# Patient Record
Sex: Male | Born: 1935 | Race: White | Hispanic: No | Marital: Married | Smoking: Former smoker
Health system: Southern US, Community
[De-identification: ages and names within clinical notes are randomized; demographics above are authoritative.]

## PROBLEM LIST (undated history)

## (undated) DIAGNOSIS — I639 Cerebral infarction, unspecified: Secondary | ICD-10-CM

## (undated) DIAGNOSIS — M1990 Unspecified osteoarthritis, unspecified site: Secondary | ICD-10-CM

## (undated) DIAGNOSIS — Z9289 Personal history of other medical treatment: Secondary | ICD-10-CM

## (undated) DIAGNOSIS — K861 Other chronic pancreatitis: Secondary | ICD-10-CM

## (undated) DIAGNOSIS — F32A Depression, unspecified: Secondary | ICD-10-CM

## (undated) DIAGNOSIS — I4891 Unspecified atrial fibrillation: Secondary | ICD-10-CM

## (undated) DIAGNOSIS — E039 Hypothyroidism, unspecified: Secondary | ICD-10-CM

## (undated) DIAGNOSIS — E1165 Type 2 diabetes mellitus with hyperglycemia: Secondary | ICD-10-CM

## (undated) DIAGNOSIS — E7800 Pure hypercholesterolemia, unspecified: Secondary | ICD-10-CM

## (undated) DIAGNOSIS — Z9981 Dependence on supplemental oxygen: Secondary | ICD-10-CM

## (undated) DIAGNOSIS — F329 Major depressive disorder, single episode, unspecified: Secondary | ICD-10-CM

## (undated) DIAGNOSIS — I10 Essential (primary) hypertension: Secondary | ICD-10-CM

## (undated) DIAGNOSIS — R51 Headache: Secondary | ICD-10-CM

## (undated) DIAGNOSIS — IMO0002 Reserved for concepts with insufficient information to code with codable children: Secondary | ICD-10-CM

## (undated) DIAGNOSIS — H04123 Dry eye syndrome of bilateral lacrimal glands: Secondary | ICD-10-CM

## (undated) DIAGNOSIS — E1151 Type 2 diabetes mellitus with diabetic peripheral angiopathy without gangrene: Secondary | ICD-10-CM

## (undated) DIAGNOSIS — I739 Peripheral vascular disease, unspecified: Secondary | ICD-10-CM

## (undated) DIAGNOSIS — I499 Cardiac arrhythmia, unspecified: Secondary | ICD-10-CM

## (undated) DIAGNOSIS — H5440 Blindness, one eye, unspecified eye: Secondary | ICD-10-CM

## (undated) HISTORY — PX: TOE AMPUTATION: SHX809

## (undated) HISTORY — DX: Personal history of other medical treatment: Z92.89

## (undated) HISTORY — PX: JOINT REPLACEMENT: SHX530

## (undated) HISTORY — PX: HEMORRHOID SURGERY: SHX153

## (undated) HISTORY — PX: OTHER SURGICAL HISTORY: SHX169

## (undated) HISTORY — PX: BACK SURGERY: SHX140

## (undated) HISTORY — DX: Essential (primary) hypertension: I10

## (undated) HISTORY — PX: REPLACEMENT TOTAL KNEE: SUR1224

## (undated) HISTORY — PX: CATARACT EXTRACTION: SUR2

---

## 1962-04-23 HISTORY — PX: ENUCLEATION: SHX628

## 1998-04-23 HISTORY — PX: ILIAC ARTERY STENT: SHX1786

## 1998-05-10 ENCOUNTER — Inpatient Hospital Stay (HOSPITAL_COMMUNITY): Admission: RE | Admit: 1998-05-10 | Discharge: 1998-05-13 | Payer: Self-pay | Admitting: Cardiovascular Disease

## 1998-05-10 ENCOUNTER — Encounter: Payer: Self-pay | Admitting: Cardiovascular Disease

## 1998-06-07 ENCOUNTER — Inpatient Hospital Stay (HOSPITAL_COMMUNITY): Admission: EM | Admit: 1998-06-07 | Discharge: 1998-06-10 | Payer: Self-pay | Admitting: Emergency Medicine

## 1998-06-07 ENCOUNTER — Encounter: Payer: Self-pay | Admitting: Emergency Medicine

## 1998-06-11 ENCOUNTER — Encounter: Payer: Self-pay | Admitting: Emergency Medicine

## 1998-06-12 ENCOUNTER — Encounter: Payer: Self-pay | Admitting: Emergency Medicine

## 1998-06-12 ENCOUNTER — Inpatient Hospital Stay (HOSPITAL_COMMUNITY): Admission: EM | Admit: 1998-06-12 | Discharge: 1998-06-16 | Payer: Self-pay | Admitting: Emergency Medicine

## 1998-06-13 ENCOUNTER — Encounter: Payer: Self-pay | Admitting: Internal Medicine

## 1998-06-14 ENCOUNTER — Encounter: Payer: Self-pay | Admitting: Internal Medicine

## 1998-08-09 ENCOUNTER — Inpatient Hospital Stay (HOSPITAL_COMMUNITY): Admission: EM | Admit: 1998-08-09 | Discharge: 1998-08-10 | Payer: Self-pay | Admitting: Emergency Medicine

## 1998-08-09 ENCOUNTER — Encounter: Payer: Self-pay | Admitting: Internal Medicine

## 1998-08-30 ENCOUNTER — Encounter: Payer: Self-pay | Admitting: Orthopedic Surgery

## 1998-08-30 ENCOUNTER — Ambulatory Visit (HOSPITAL_COMMUNITY): Admission: RE | Admit: 1998-08-30 | Discharge: 1998-08-30 | Payer: Self-pay | Admitting: Orthopedic Surgery

## 1998-09-08 ENCOUNTER — Ambulatory Visit (HOSPITAL_COMMUNITY): Admission: RE | Admit: 1998-09-08 | Discharge: 1998-09-08 | Payer: Self-pay | Admitting: Orthopedic Surgery

## 1999-04-08 ENCOUNTER — Inpatient Hospital Stay (HOSPITAL_COMMUNITY): Admission: EM | Admit: 1999-04-08 | Discharge: 1999-04-12 | Payer: Self-pay

## 1999-04-10 ENCOUNTER — Encounter: Payer: Self-pay | Admitting: Internal Medicine

## 1999-04-15 ENCOUNTER — Inpatient Hospital Stay: Admission: EM | Admit: 1999-04-15 | Discharge: 1999-04-20 | Payer: Self-pay | Admitting: *Deleted

## 1999-04-15 ENCOUNTER — Encounter: Payer: Self-pay | Admitting: Vascular Surgery

## 1999-04-16 ENCOUNTER — Encounter: Payer: Self-pay | Admitting: Vascular Surgery

## 1999-04-17 ENCOUNTER — Encounter: Payer: Self-pay | Admitting: Vascular Surgery

## 2000-10-29 ENCOUNTER — Encounter: Admission: RE | Admit: 2000-10-29 | Discharge: 2000-10-29 | Payer: Self-pay | Admitting: Internal Medicine

## 2000-10-29 ENCOUNTER — Encounter: Payer: Self-pay | Admitting: Internal Medicine

## 2002-12-11 ENCOUNTER — Inpatient Hospital Stay (HOSPITAL_COMMUNITY): Admission: AD | Admit: 2002-12-11 | Discharge: 2002-12-12 | Payer: Self-pay | Admitting: Emergency Medicine

## 2002-12-11 ENCOUNTER — Encounter: Payer: Self-pay | Admitting: Emergency Medicine

## 2002-12-12 ENCOUNTER — Encounter: Payer: Self-pay | Admitting: Surgery

## 2002-12-13 ENCOUNTER — Inpatient Hospital Stay (HOSPITAL_COMMUNITY): Admission: EM | Admit: 2002-12-13 | Discharge: 2002-12-15 | Payer: Self-pay | Admitting: Emergency Medicine

## 2002-12-13 ENCOUNTER — Encounter: Payer: Self-pay | Admitting: General Surgery

## 2002-12-13 ENCOUNTER — Encounter: Payer: Self-pay | Admitting: Emergency Medicine

## 2002-12-14 ENCOUNTER — Encounter: Payer: Self-pay | Admitting: General Surgery

## 2003-02-16 ENCOUNTER — Encounter: Admission: RE | Admit: 2003-02-16 | Discharge: 2003-02-16 | Payer: Self-pay | Admitting: Internal Medicine

## 2003-03-09 ENCOUNTER — Inpatient Hospital Stay (HOSPITAL_COMMUNITY): Admission: EM | Admit: 2003-03-09 | Discharge: 2003-03-11 | Payer: Self-pay | Admitting: Emergency Medicine

## 2003-04-24 HISTORY — PX: KYPHOPLASTY: SHX5884

## 2003-05-14 ENCOUNTER — Encounter (INDEPENDENT_AMBULATORY_CARE_PROVIDER_SITE_OTHER): Payer: Self-pay | Admitting: *Deleted

## 2003-05-14 ENCOUNTER — Ambulatory Visit (HOSPITAL_COMMUNITY): Admission: RE | Admit: 2003-05-14 | Discharge: 2003-05-14 | Payer: Self-pay

## 2003-05-25 ENCOUNTER — Encounter: Admission: RE | Admit: 2003-05-25 | Discharge: 2003-05-25 | Payer: Self-pay

## 2003-07-07 ENCOUNTER — Ambulatory Visit (HOSPITAL_COMMUNITY): Admission: RE | Admit: 2003-07-07 | Discharge: 2003-07-07 | Payer: Self-pay

## 2003-07-07 ENCOUNTER — Encounter (INDEPENDENT_AMBULATORY_CARE_PROVIDER_SITE_OTHER): Payer: Self-pay | Admitting: Specialist

## 2003-08-03 ENCOUNTER — Encounter: Admission: RE | Admit: 2003-08-03 | Discharge: 2003-08-03 | Payer: Self-pay

## 2004-03-23 ENCOUNTER — Inpatient Hospital Stay (HOSPITAL_COMMUNITY): Admission: EM | Admit: 2004-03-23 | Discharge: 2004-03-25 | Payer: Self-pay | Admitting: Emergency Medicine

## 2005-01-01 ENCOUNTER — Encounter: Admission: RE | Admit: 2005-01-01 | Discharge: 2005-01-01 | Payer: Self-pay | Admitting: Internal Medicine

## 2005-01-10 ENCOUNTER — Encounter: Payer: Self-pay | Admitting: Internal Medicine

## 2005-01-17 ENCOUNTER — Encounter (INDEPENDENT_AMBULATORY_CARE_PROVIDER_SITE_OTHER): Payer: Self-pay | Admitting: *Deleted

## 2005-01-17 ENCOUNTER — Ambulatory Visit (HOSPITAL_COMMUNITY): Admission: RE | Admit: 2005-01-17 | Discharge: 2005-01-17 | Payer: Self-pay | Admitting: Internal Medicine

## 2005-04-07 IMAGING — CR DG HAND COMPLETE 3+V*L*
3 series · 3 of 3 positions shown · non-contrast
Comparison: none

CLINICAL DATA: Partial amputation of the left fourth finger.
 LEFT HAND
 Three views of the left hand were obtained.  Partial amputation of the left fourth finger is noted, being amputated to the midportion of the middle phalanx of the left fourth digit.  There is soft tissue swelling of the remaining fourth digit, but no present radiographic evidence of osteomyelitis is seen.  Carpal bones are in normal position and the ulnar styloid is intact.
 IMPRESSION
 Partial amputation of the left fourth digit at the midportion of the middle phalanx with soft tissue swelling.  No definite osteomyelitis.

[view not recorded (1 of 3)]
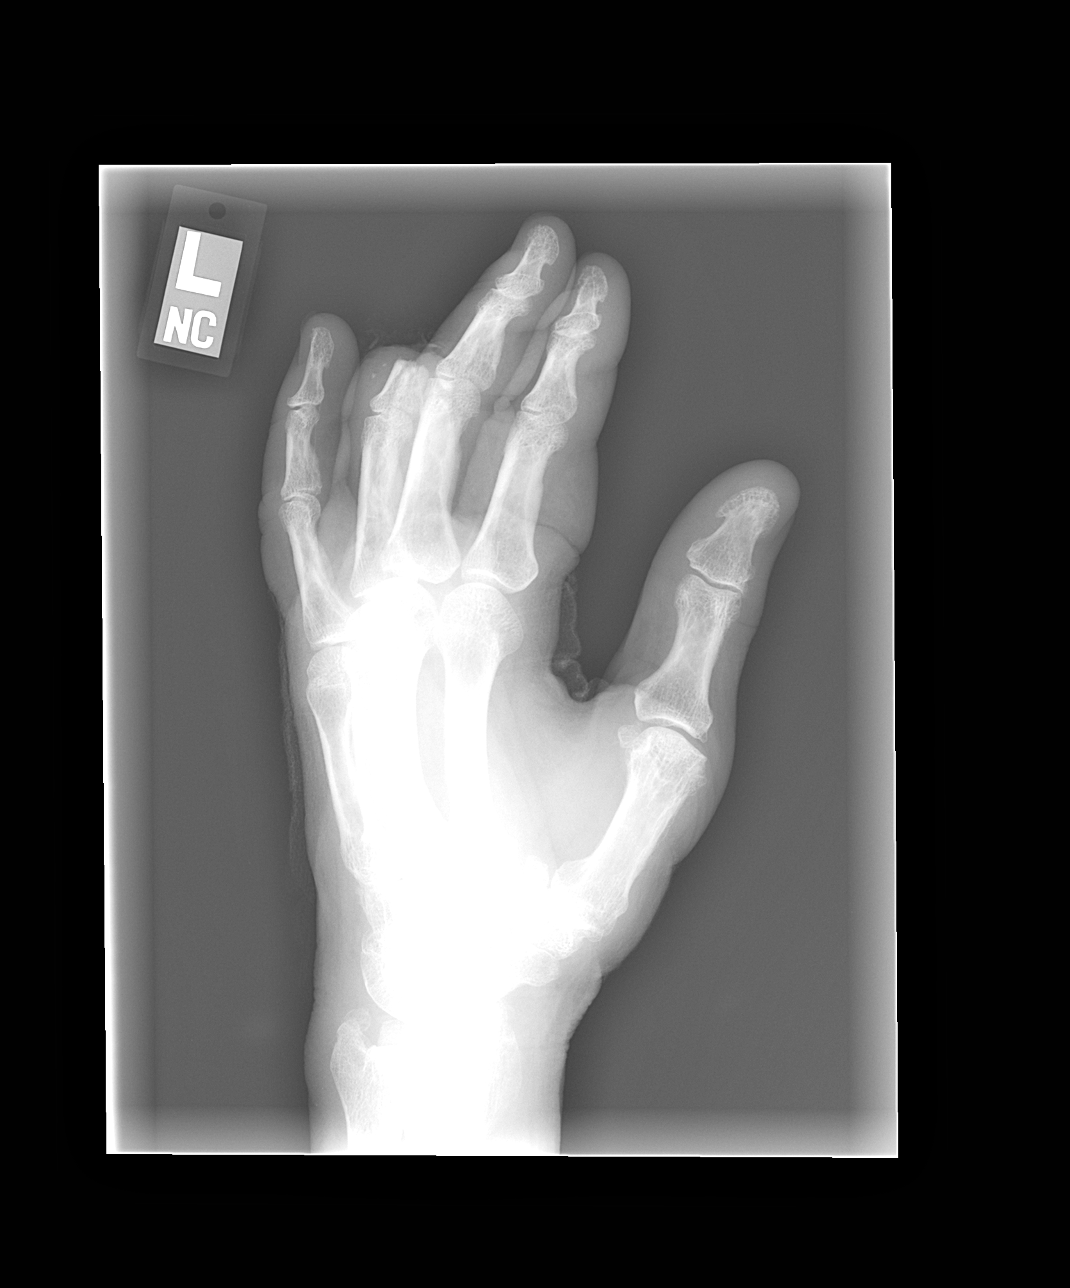

[view not recorded (2 of 3)]
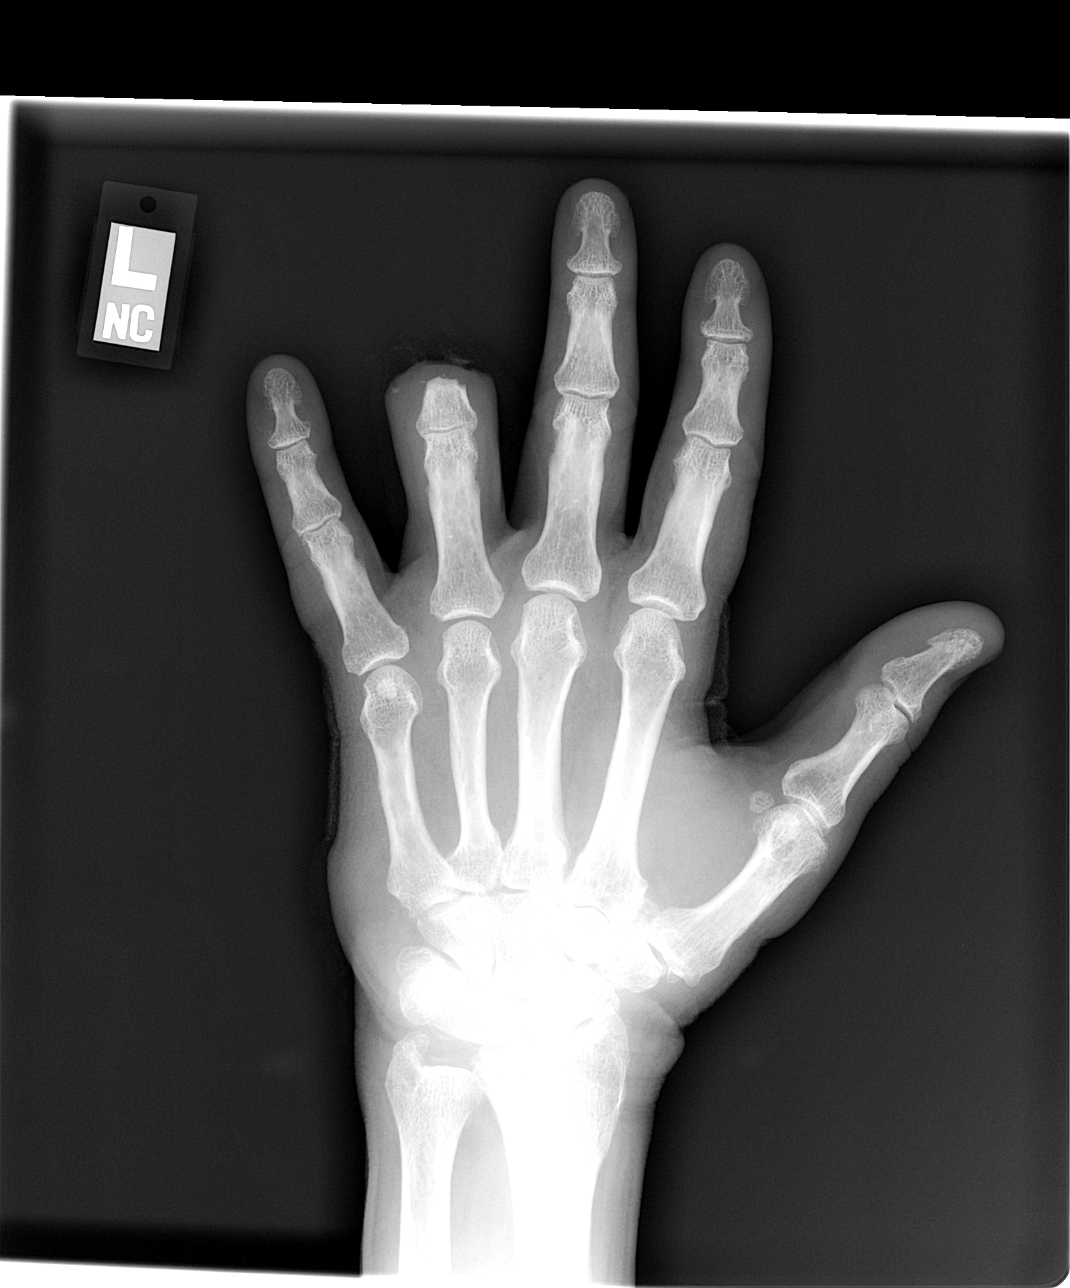

[view not recorded (3 of 3)]
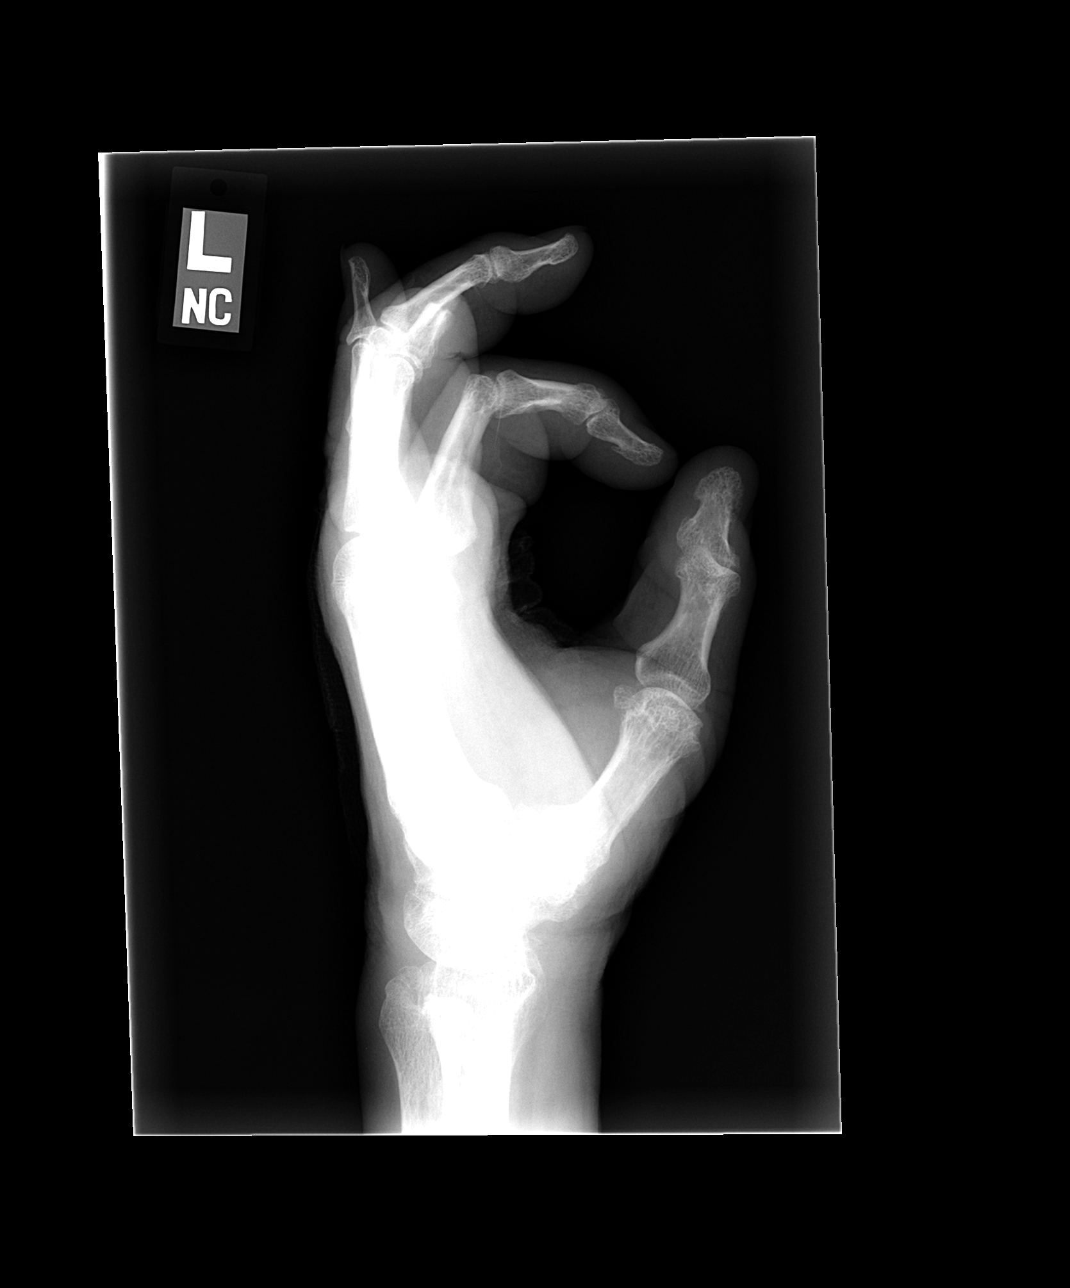

[3 of 3 positions shown; findings below may reference images not displayed]

## 2005-05-02 ENCOUNTER — Ambulatory Visit: Payer: Self-pay | Admitting: Internal Medicine

## 2005-05-17 ENCOUNTER — Encounter (INDEPENDENT_AMBULATORY_CARE_PROVIDER_SITE_OTHER): Payer: Self-pay | Admitting: *Deleted

## 2005-05-17 ENCOUNTER — Ambulatory Visit: Payer: Self-pay | Admitting: Internal Medicine

## 2005-06-16 IMAGING — CR DG HAND COMPLETE 3+V*L*
3 series · 3 of 3 positions shown · non-contrast
Comparison: none

CLINICAL DATA: Partial amputation of the left 12th digit.  Poor healing.  Question osteomyelitis. 
 LEFT HAND COMPLETE 
 There has been amputation of the left fourth finger at the mid proximal phalangeal level.  There is no plain film evidence for osteomyelitis at this time.  Minor degenerative changes are seen associated with the distal interphalangeal joints. 
 IMPRESSION
 Amputation of the left fourth finger at the mid proximal phalangeal level.  No plain film evidence for osteomyelitis.

[view not recorded (1 of 3)]
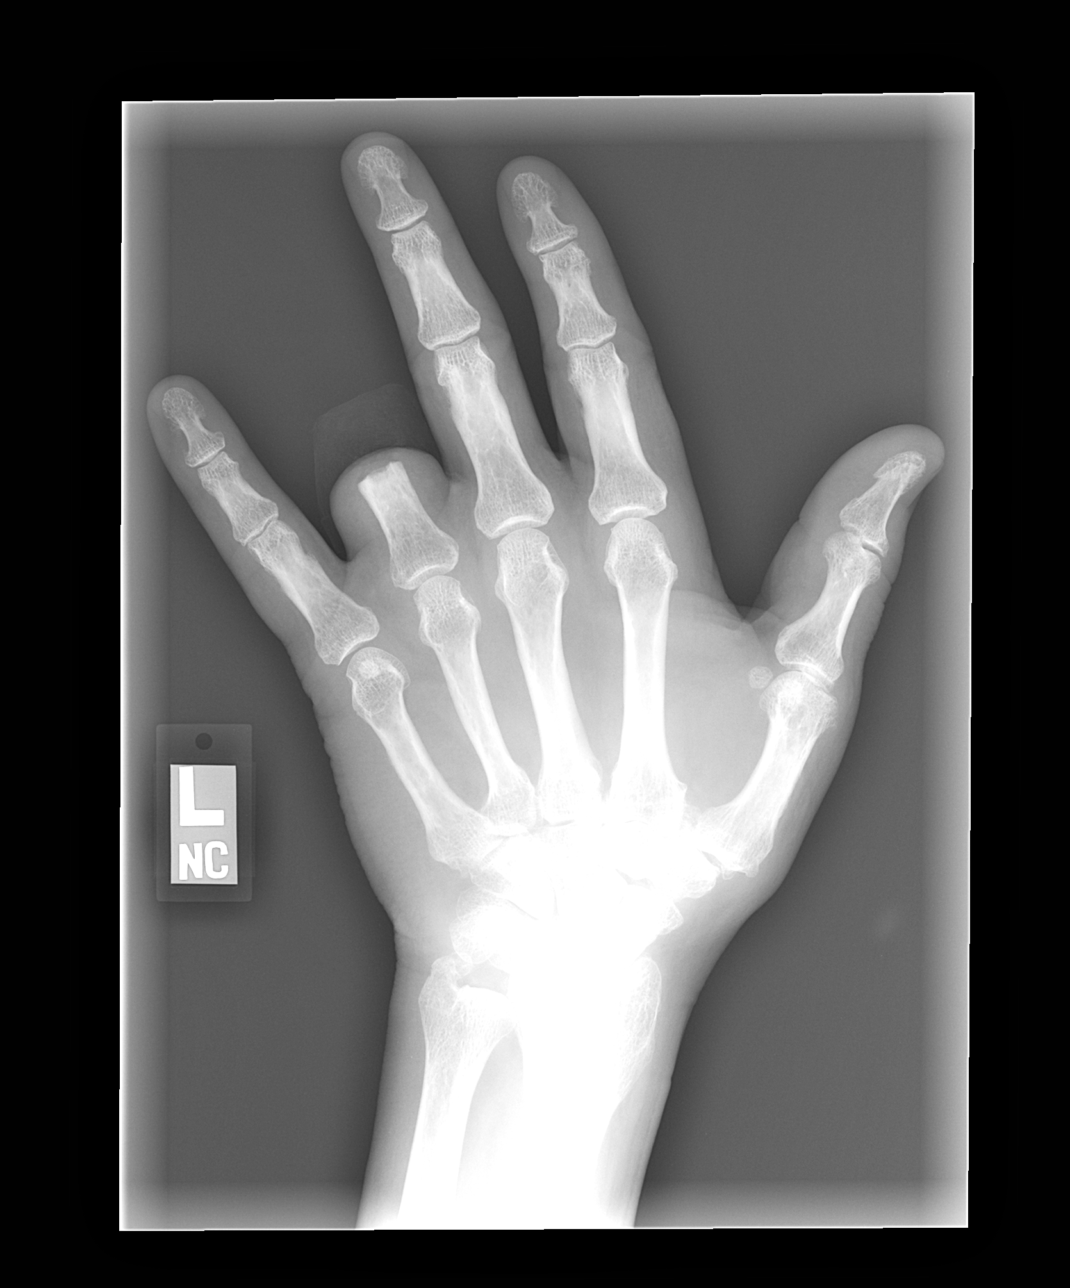

[view not recorded (2 of 3)]
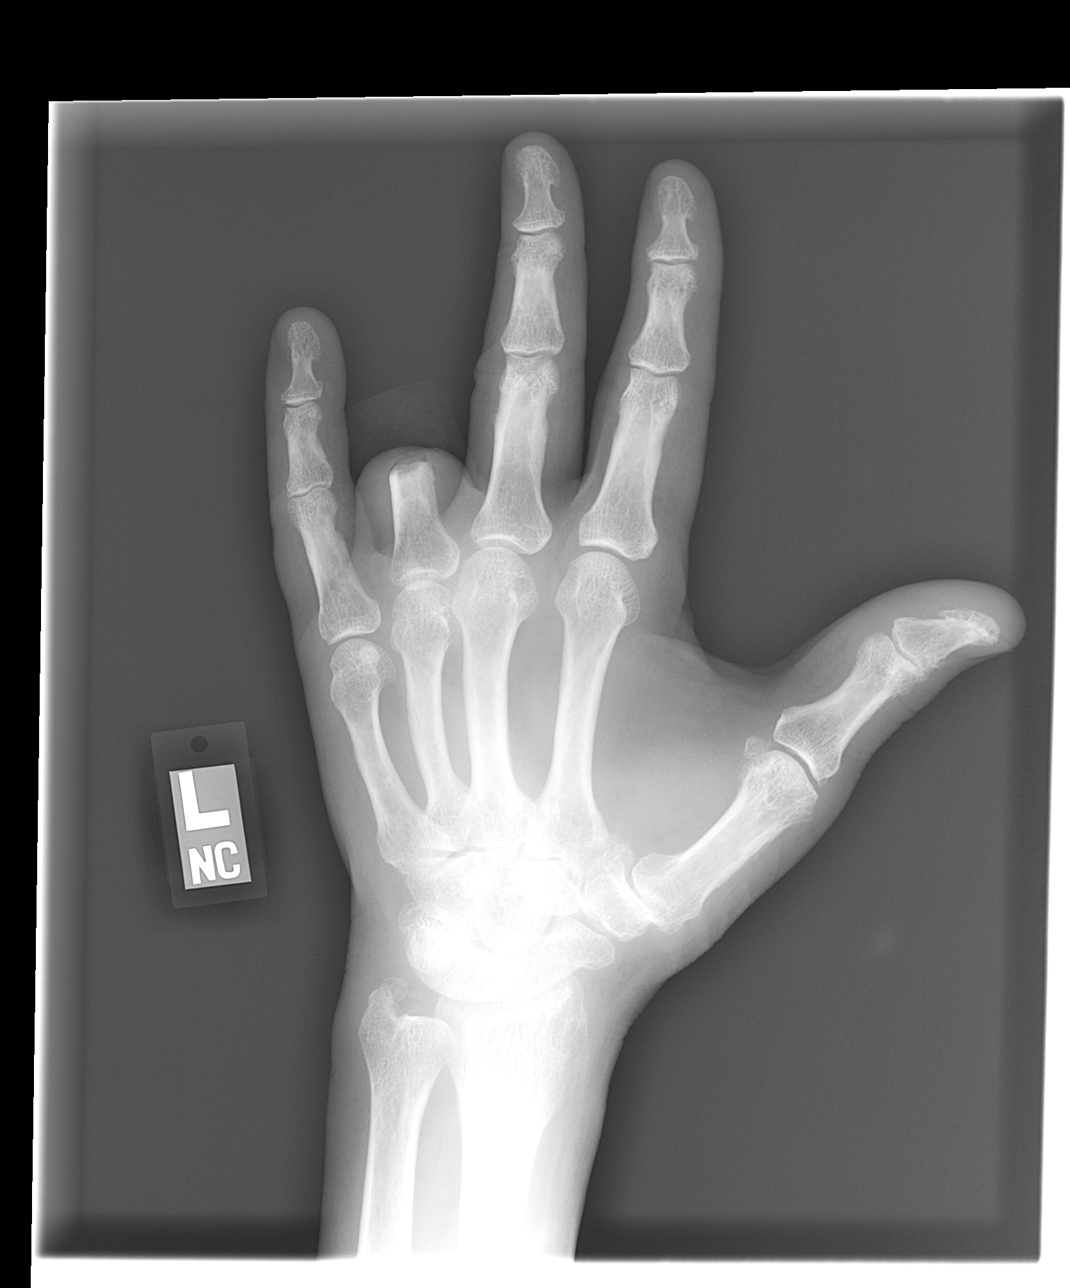

[view not recorded (3 of 3)]
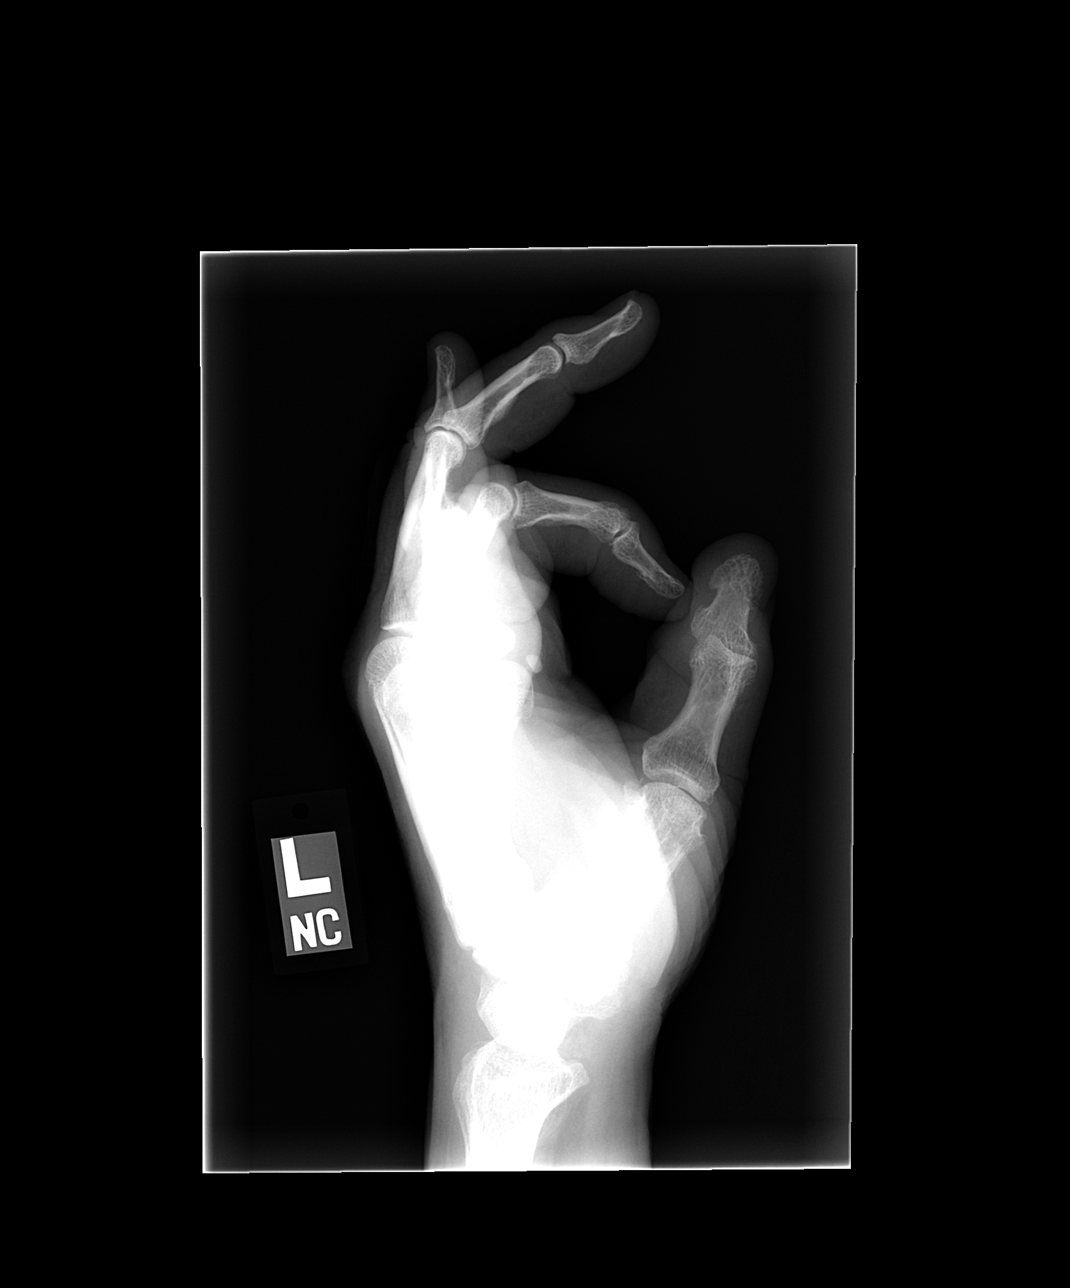

[3 of 3 positions shown; findings below may reference images not displayed]

## 2006-02-04 IMAGING — CR DG CHEST 2V
2 series · 2 of 2 positions shown · non-contrast
Comparison: 2 view chest x-ray [HOSPITAL] 02/16/2003.

CLINICAL DATA: Atrial fibrillation. Shortness of breath.

CHEST - 2 VIEW  03/23/2004:

[view not recorded (1 of 2)]
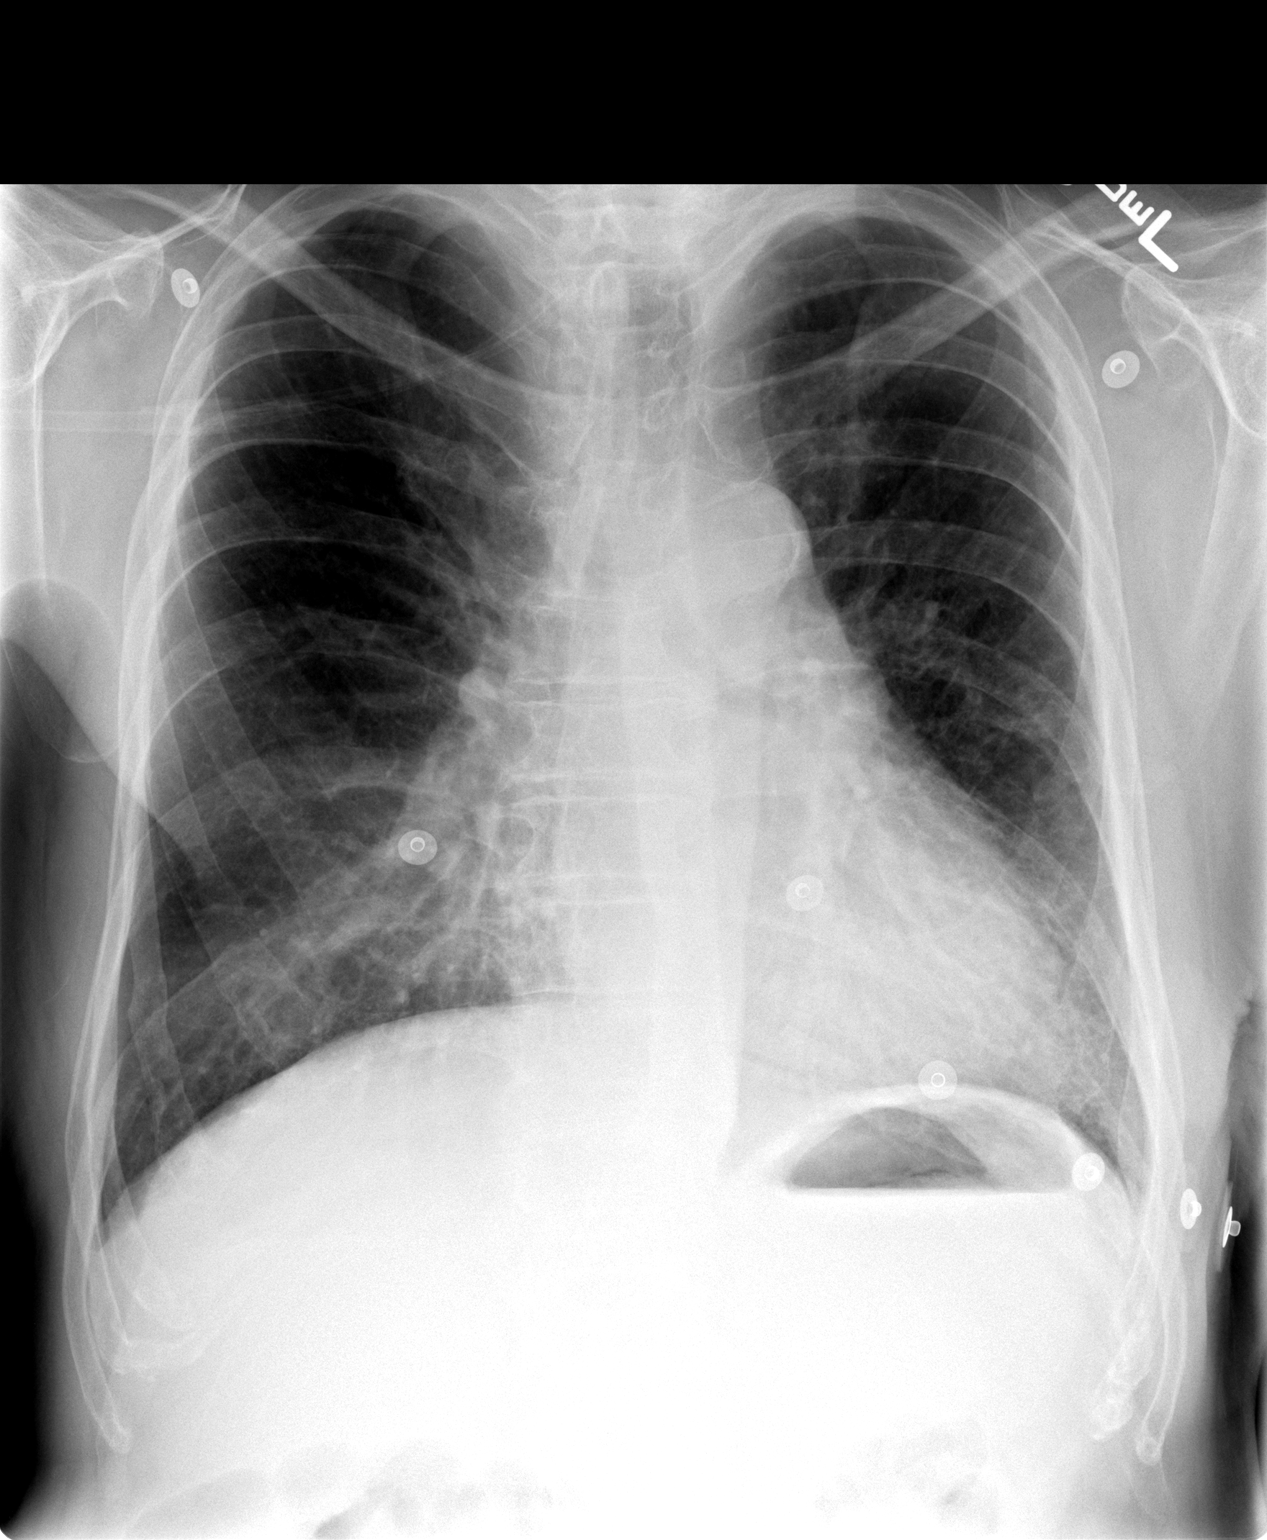

[view not recorded (2 of 2)]
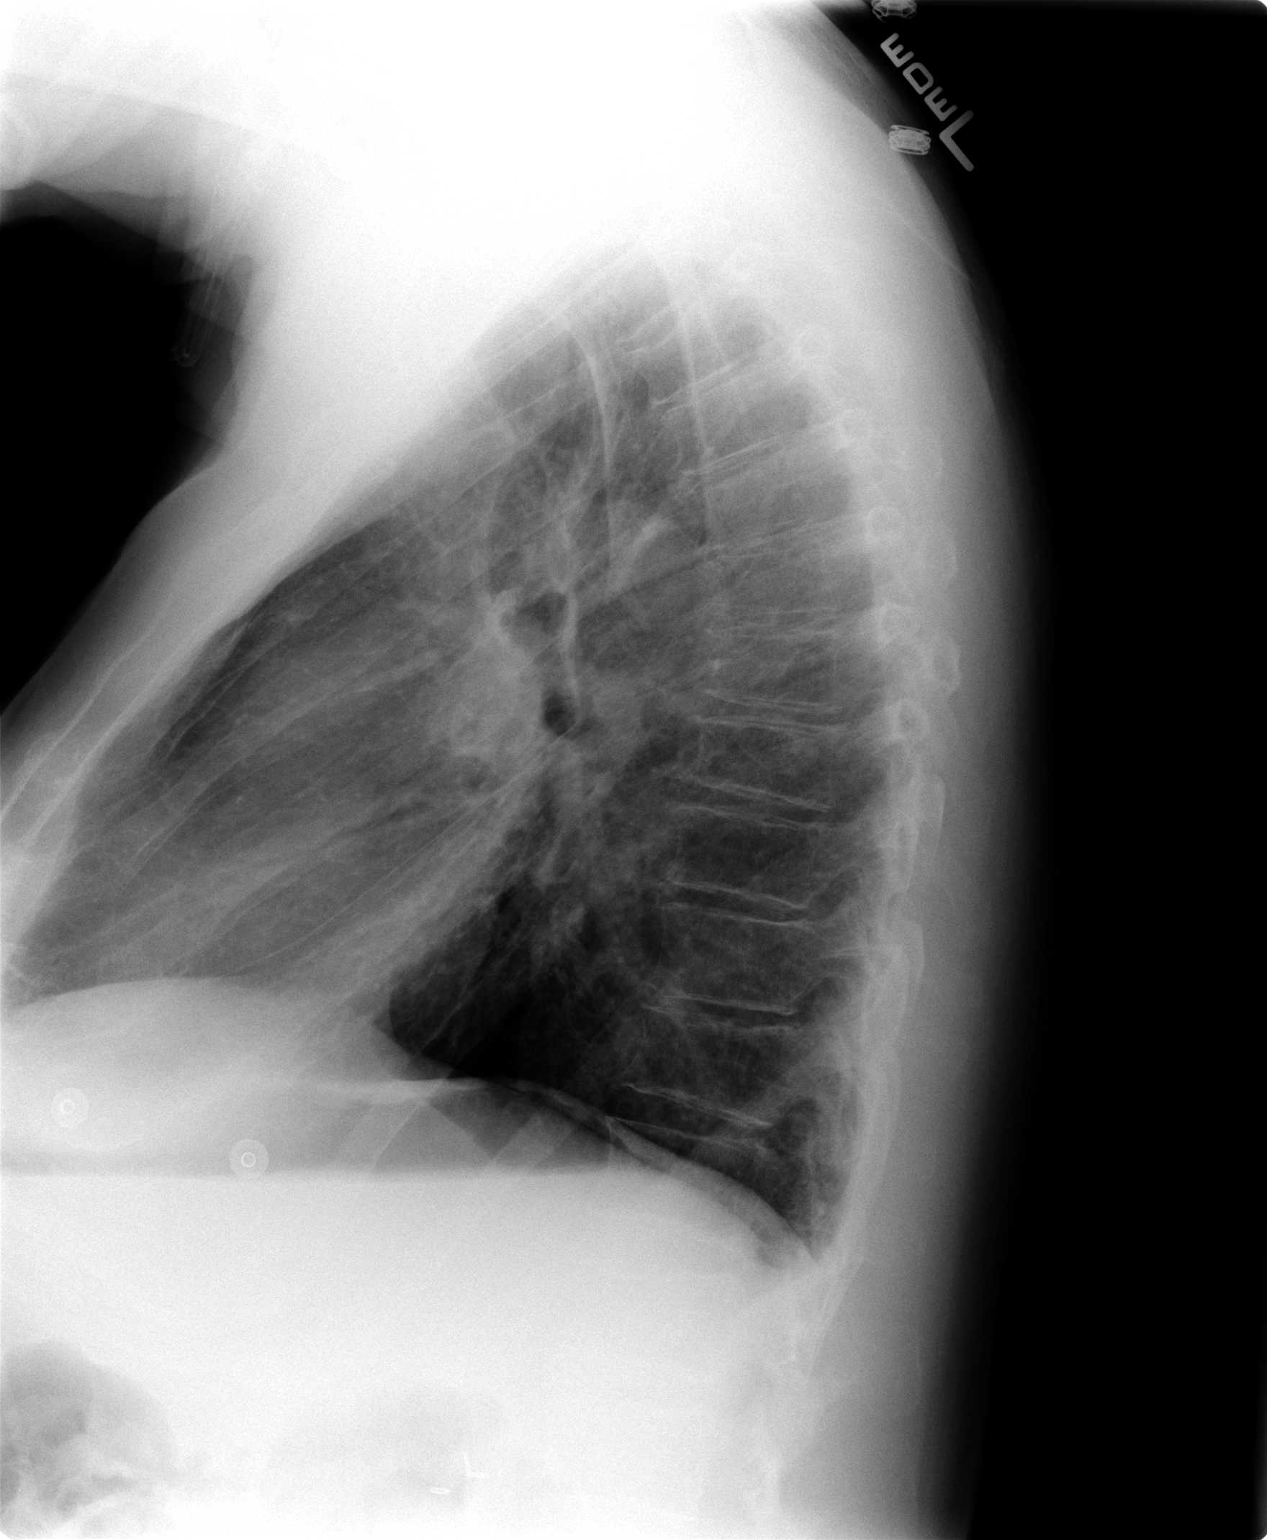

[2 of 2 positions shown; findings below may reference images not displayed]

FINDINGS: The heart is upper normal in size to perhaps slightly enlarged but
stable. Bronchovascular markings are mildly prominent diffusely with mild
central peribronchial thickening in a pattern that is consistent with chronic
bronchitis. However, since the previous examination, the patient has developed
patchy opacities in the right lower lobe medially. The lungs remain clear
otherwise. Degenerative changes are again noted throughout the thoracic spine.
Healed lower right rib fractures are noted.
IMPRESSION: 1. Right lower lobe atelectasis versus bronchopneumonia superimposed upon
changes of COPD.

2. Stable borderline heart size.

## 2006-04-14 ENCOUNTER — Inpatient Hospital Stay (HOSPITAL_COMMUNITY): Admission: AC | Admit: 2006-04-14 | Discharge: 2006-05-07 | Payer: Self-pay

## 2006-04-17 ENCOUNTER — Encounter (INDEPENDENT_AMBULATORY_CARE_PROVIDER_SITE_OTHER): Payer: Self-pay | Admitting: Cardiology

## 2006-05-07 ENCOUNTER — Ambulatory Visit: Payer: Self-pay | Admitting: Physical Medicine & Rehabilitation

## 2006-05-07 ENCOUNTER — Inpatient Hospital Stay (HOSPITAL_COMMUNITY)
Admission: RE | Admit: 2006-05-07 | Discharge: 2006-05-14 | Payer: Self-pay | Admitting: Physical Medicine & Rehabilitation

## 2006-09-04 ENCOUNTER — Inpatient Hospital Stay (HOSPITAL_COMMUNITY): Admission: RE | Admit: 2006-09-04 | Discharge: 2006-09-08 | Payer: Self-pay | Admitting: Orthopedic Surgery

## 2006-09-19 ENCOUNTER — Inpatient Hospital Stay (HOSPITAL_COMMUNITY): Admission: RE | Admit: 2006-09-19 | Discharge: 2006-09-23 | Payer: Self-pay | Admitting: Orthopedic Surgery
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
## 2006-12-01 IMAGING — XA IR KYPHOPLASTY/VERTERBROPLASTY
1 series · 12 of 24 positions shown · non-contrast
Comparison: none

CLINICAL DATA: Patient with severe low back pain secondary to compression fracture at L5.
KYPHOPLASTY AT L5:
Following a full explanation of the procedure along with the potentially associated complications, an informed witnessed consent was obtained.  
The patient was laid prone on the fluoroscopic table.  The skin overlying the lower lumbar region was then prepped and draped in the usual sterile fashion.  
Thereafter, the right pedicle was identified and brought into optimal view.  The skin overlying this was infiltrated with 0.25 percent bupivacaine followed by the advancement of an 11-gauge Jamshidi needle through the right pedicle and positioned in the posterior one-third at L5.  In the same fashion, the left pedicle was accessed following the instillation of 0.25 bupivacaine.  The left transpedicular Jamshidi needle was positioned in the posterior third at L5 as well.  Both these were then exchanged over KyphX osteo bone pins for the KyphX advanced osteo introducer system comprised of a working cannula and KyphX osteo drills.  
This combination was advanced bilaterally until the KyphX osteo drills were in the posterior thirds at L5.  
At this time, the bone pins were removed.  In a medial trajectory, the combinations were advanced bilaterally until the tips of the working cannulae were inside the posterior third at L5.  The KyphX osteo drills were removed and a core sample sent for pathologic analysis.
Through the working cannula, the KyphX bone biopsy device was then advanced to within 5 mm from the anterior aspect of L5.  Samples were sent for pathologic analysis from both transpedicular approaches.  
Through the working cannula, a KyphX inflatable bone tamps 20 x 3 were then advanced and positioned with their distal markers about 7 mm from the anterior aspect of L5.  Both these were then inflated using Kyphon inflation syringe devices with microtubing.  The inflations were continued until there was apposition with the inferior end plates.
At this time, methylmethacrylate mixture was reconstituted with Tobramycin in the Kyphon bone mixing device system.  These were then loaded onto KyphX bone fillers.  
The balloon was deflated followed by the instillation of 4.5 bone filler equivalents into the L5 vertebral body.  No extravasation was noted into disk spaces or posteriorly into the spinal canal.  No extravasation was noted into epidural or venous structures.  
The bone fillers and cannula were then removed and hemostasis was achieved over the overlying skin sites.
The patient tolerated the procedure well. There were no acute complications.
Medications utilized:  Versed 2 mg IV and Fentanyl 75 micrograms IV.

[Series 1: run · 12 of 97 slices shown]
[im 5/97]
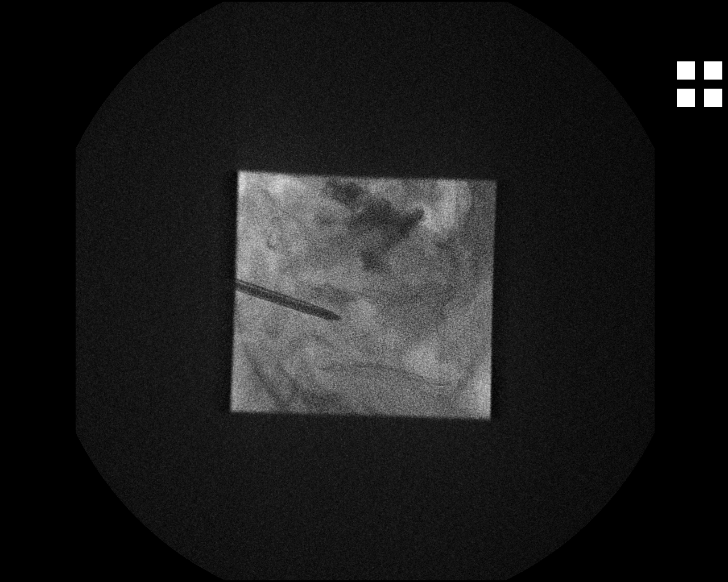
[im 13/97]
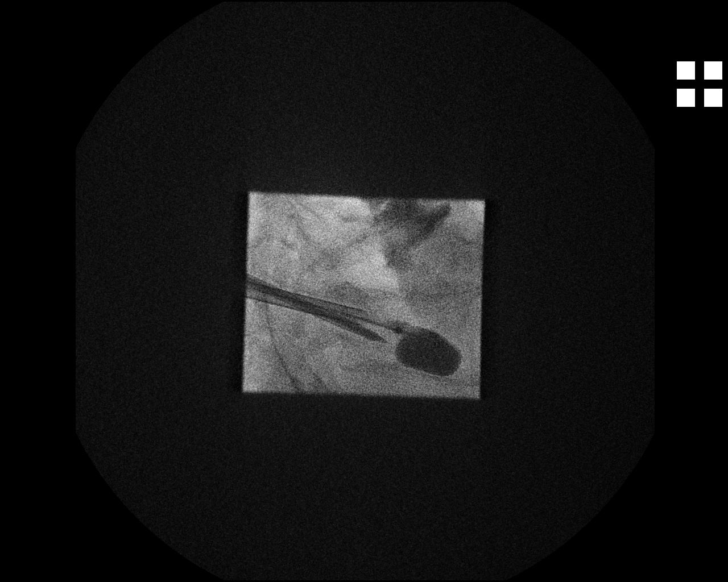
[im 21/97]
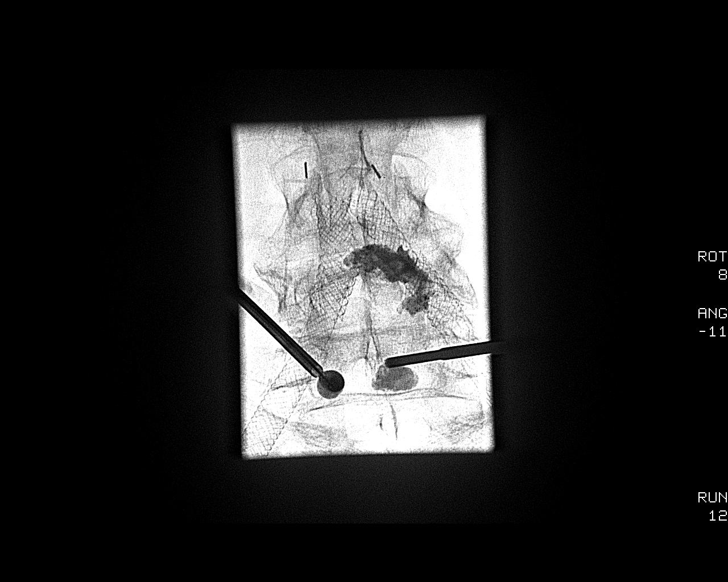
[im 30/97]
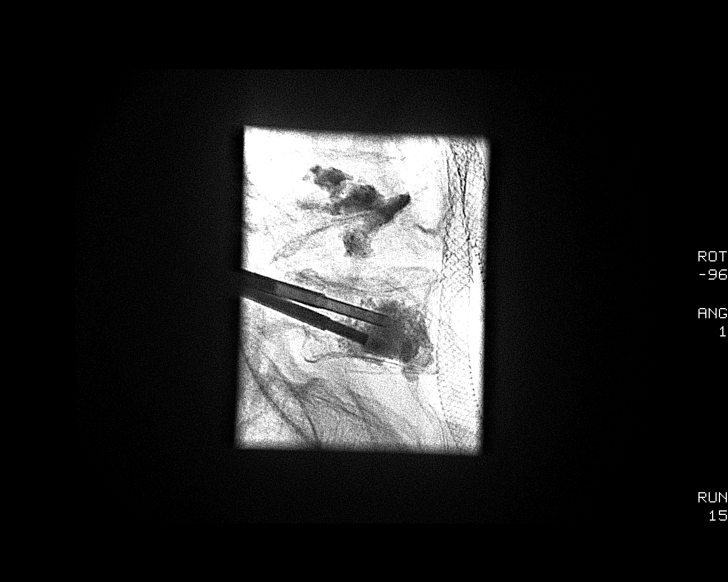
[im 38/97]
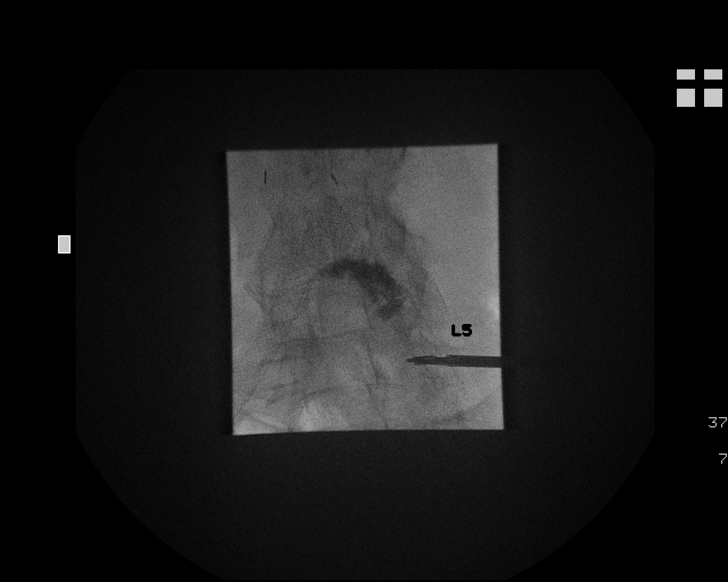
[im 46/97]
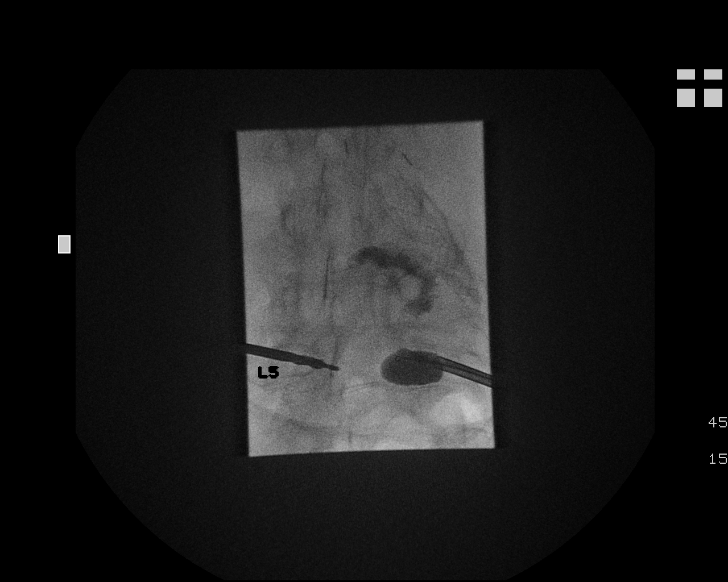
[im 55/97]
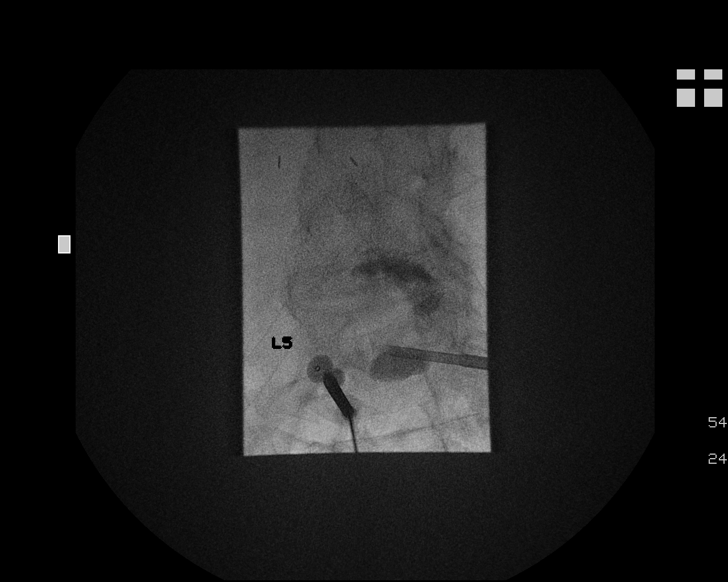
[im 63/97]
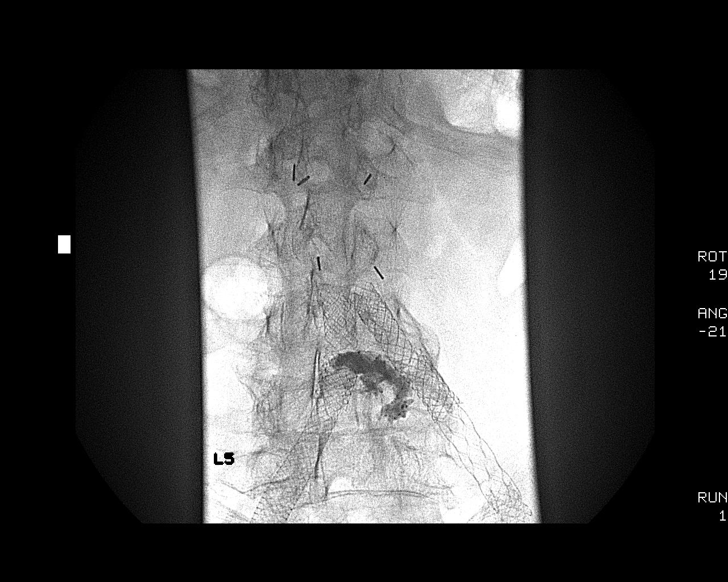
[im 71/97]
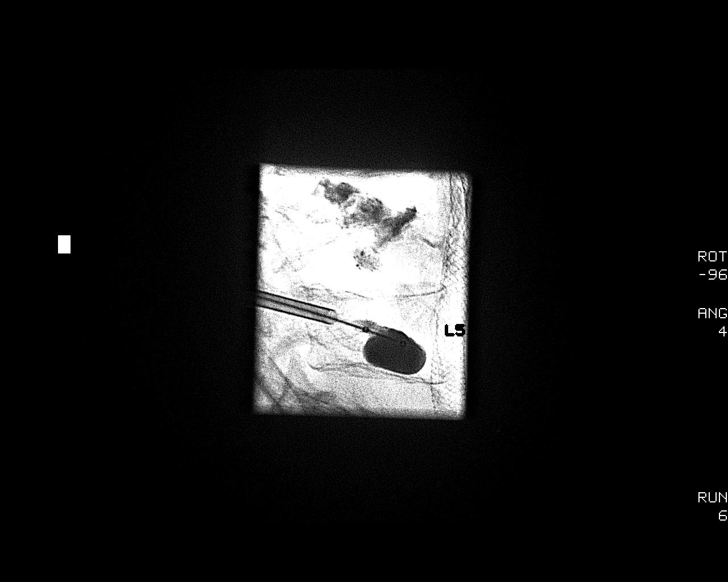
[im 80/97]
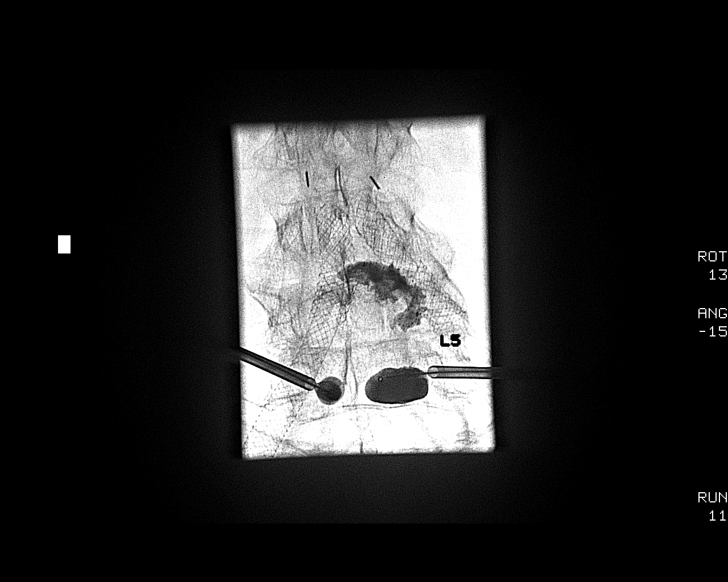
[im 88/97]
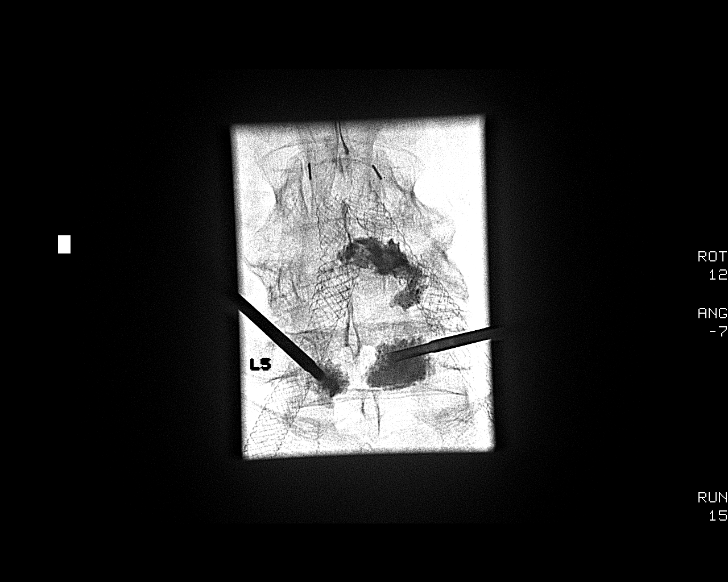
[im 97/97]
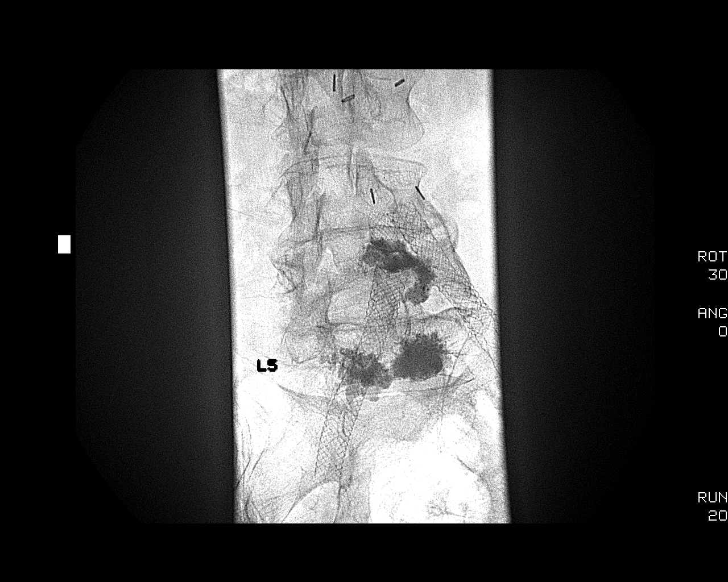

[12 of 24 positions shown; findings below may reference images not displayed]

IMPRESSION: 1.  Status post needle placement for deep core bone biopsy at L5.
2.  Status post closed reduction with internal fixation of osteoporotic painful compression fractures at L5 using the kyphoplasty technique.

## 2007-04-24 DIAGNOSIS — I639 Cerebral infarction, unspecified: Secondary | ICD-10-CM

## 2007-04-24 HISTORY — DX: Cerebral infarction, unspecified: I63.9

## 2007-05-12 ENCOUNTER — Inpatient Hospital Stay (HOSPITAL_COMMUNITY): Admission: RE | Admit: 2007-05-12 | Discharge: 2007-06-20 | Payer: Self-pay | Admitting: Orthopedic Surgery

## 2007-05-12 ENCOUNTER — Ambulatory Visit: Payer: Self-pay | Admitting: Pulmonary Disease

## 2007-05-12 HISTORY — PX: TOTAL HIP ARTHROPLASTY: SHX124

## 2007-05-14 ENCOUNTER — Encounter (INDEPENDENT_AMBULATORY_CARE_PROVIDER_SITE_OTHER): Payer: Self-pay | Admitting: Orthopedic Surgery

## 2007-05-15 ENCOUNTER — Encounter (INDEPENDENT_AMBULATORY_CARE_PROVIDER_SITE_OTHER): Payer: Self-pay | Admitting: Orthopedic Surgery

## 2007-05-16 ENCOUNTER — Encounter (INDEPENDENT_AMBULATORY_CARE_PROVIDER_SITE_OTHER): Payer: Self-pay | Admitting: Orthopedic Surgery

## 2007-05-30 ENCOUNTER — Ambulatory Visit: Payer: Self-pay | Admitting: Internal Medicine

## 2008-02-21 ENCOUNTER — Inpatient Hospital Stay (HOSPITAL_COMMUNITY): Admission: EM | Admit: 2008-02-21 | Discharge: 2008-03-12 | Payer: Self-pay | Admitting: Emergency Medicine
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
## 2008-02-26 IMAGING — RF DG FEMUR 2V*L*
1 series · 9 of 9 positions shown · non-contrast
Comparison: none

CLINICAL DATA: Femur fracture.
 LEFT FEMUR ? 04/14/06:
 Intraoperative views following placement of an interlocking IM nail.

[Series 1: run · 9 of 9 slices shown]
[im 1/9]
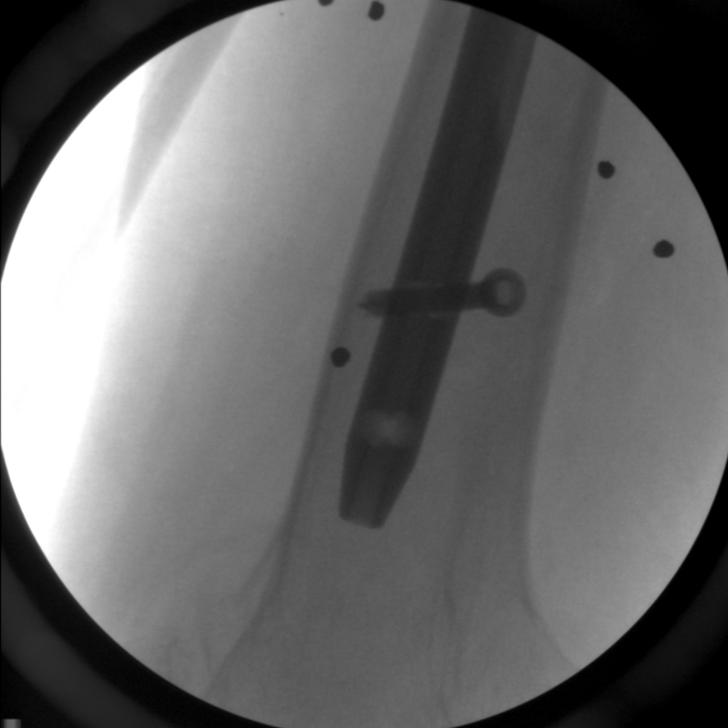
[im 2/9]
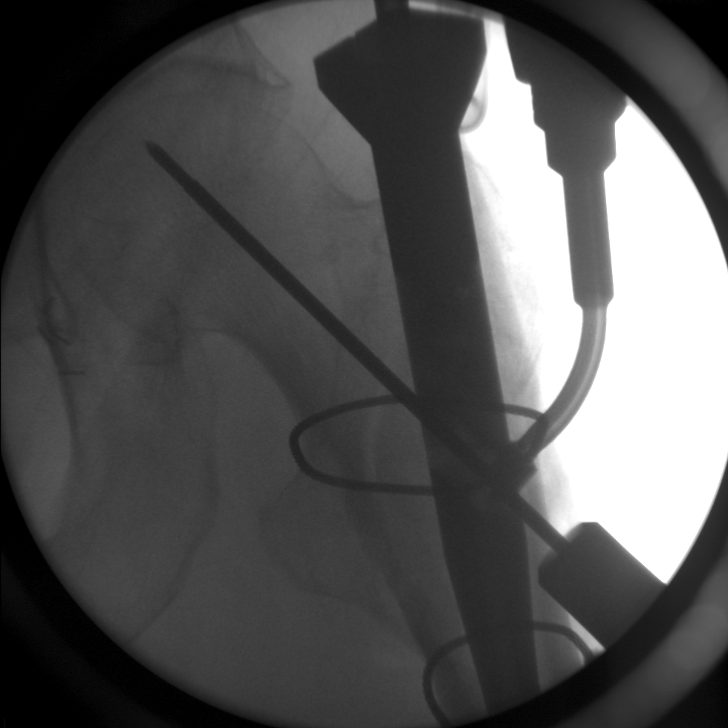
[im 3/9]
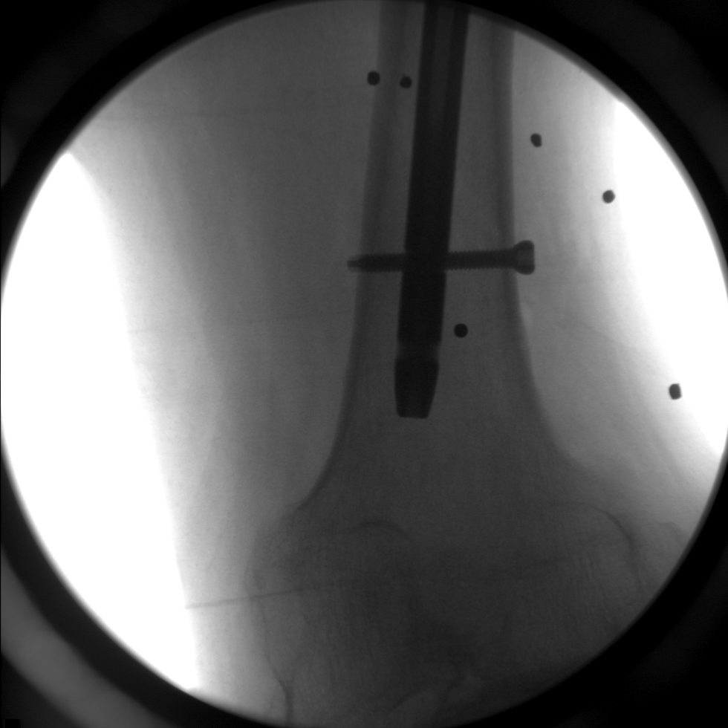
[im 4/9]
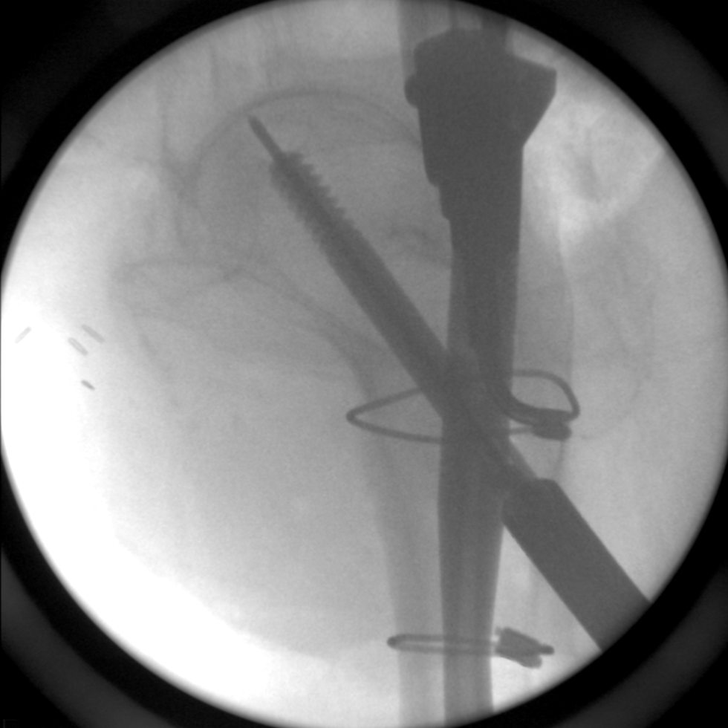
[im 5/9]
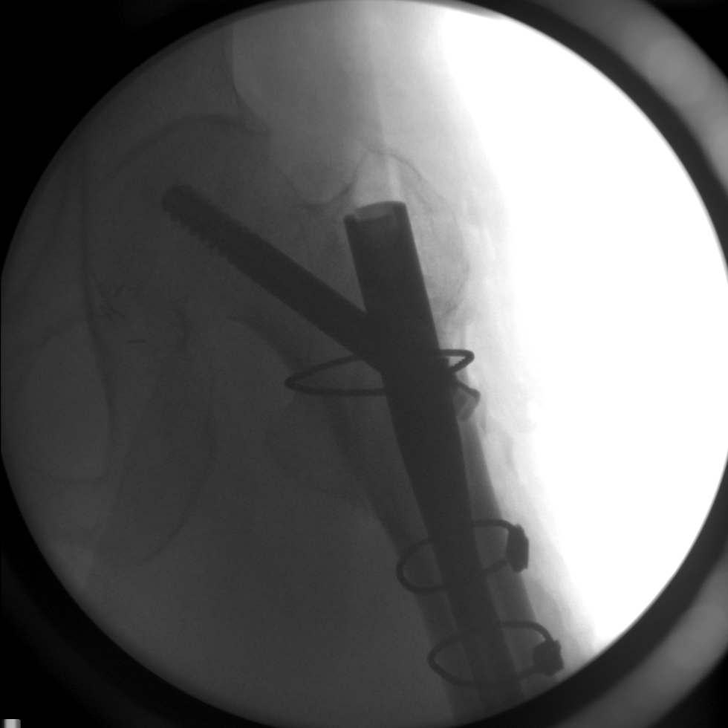
[im 6/9]
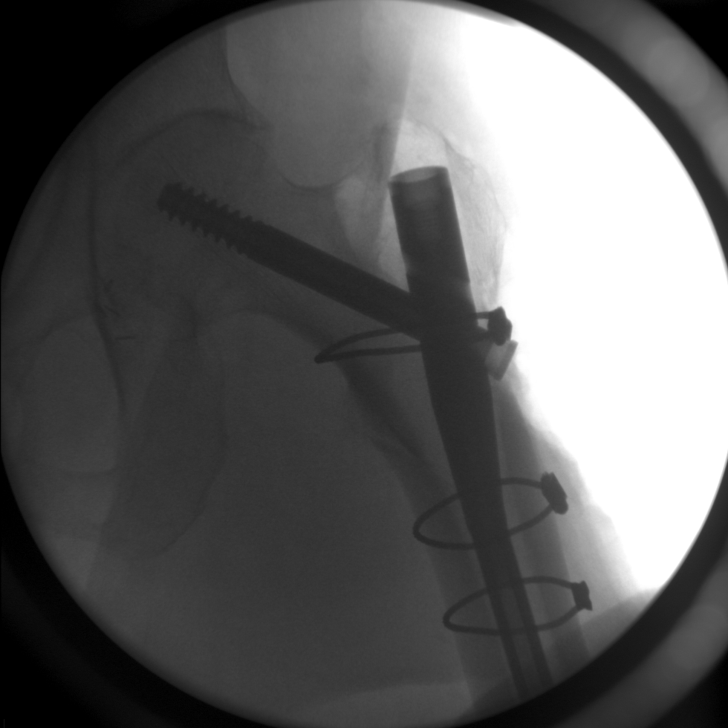
[im 7/9]
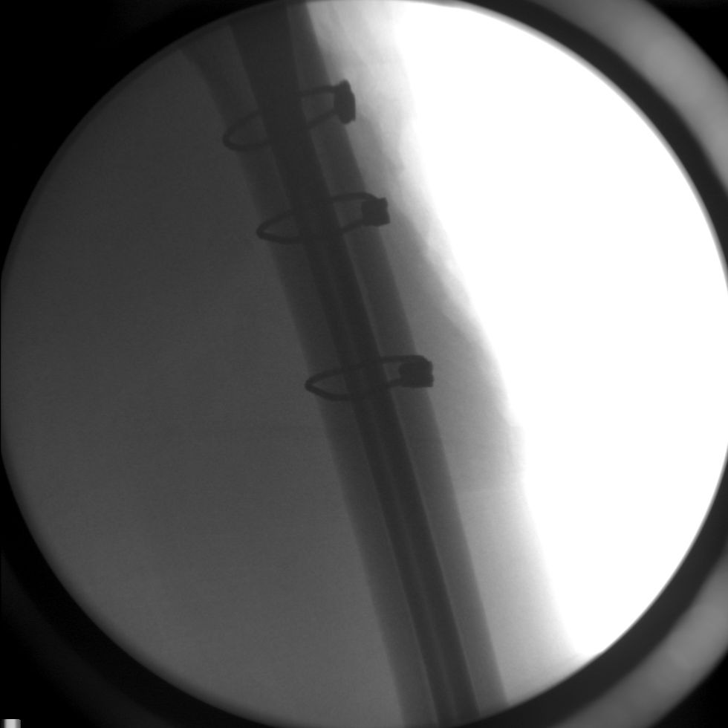
[im 8/9]
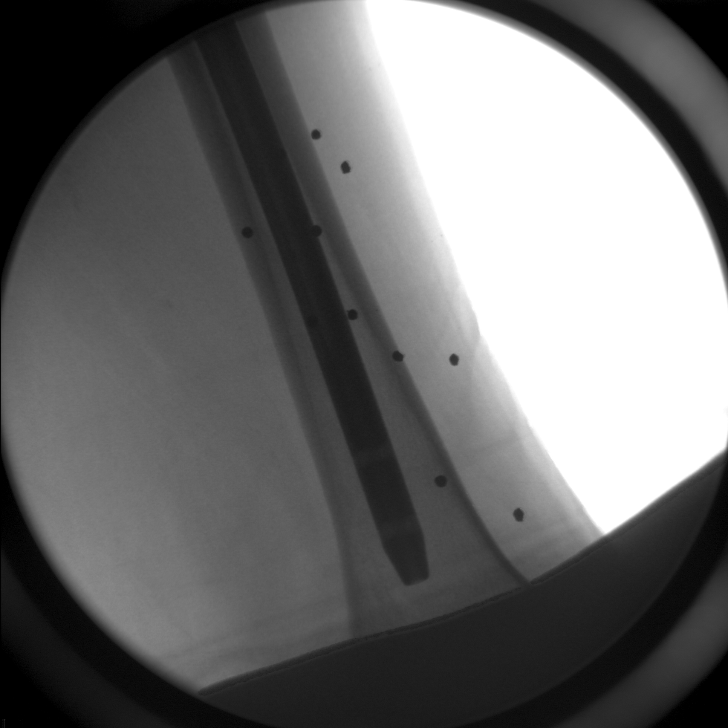
[im 9/9]
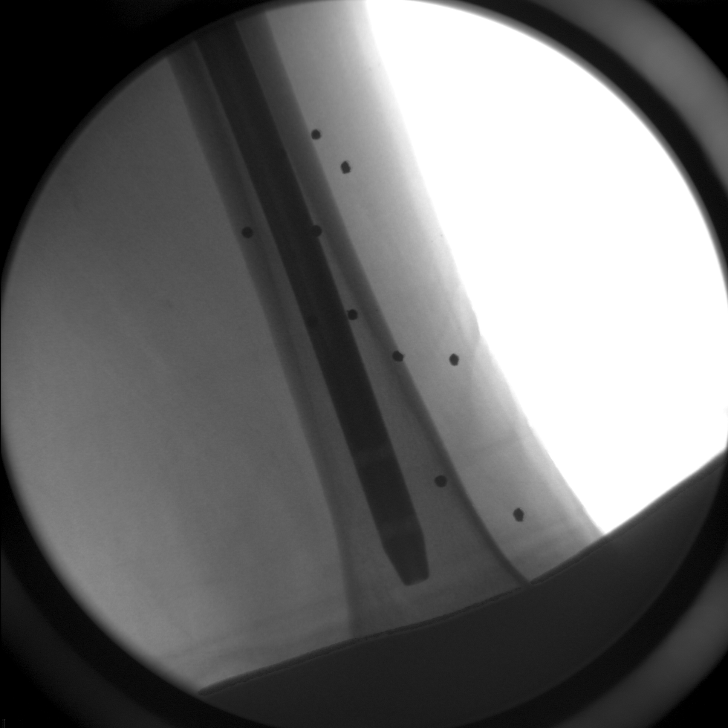

[9 of 9 positions shown; findings below may reference images not displayed]

FINDINGS: The nail is well positioned.  Cerclage banding wires are identified proximally.  Proximal femoral fracture alignment near anatomic.
IMPRESSION: Interlocking IM nail in satisfactory position.  Alignment near anatomic.

## 2008-02-26 IMAGING — CR DG CHEST 1V PORT
1 series · 1 of 1 positions shown · non-contrast
Comparison: none

CLINICAL DATA: Postop chest.
 PORTABLE CHEST ? 1 VIEW:

[view not recorded]
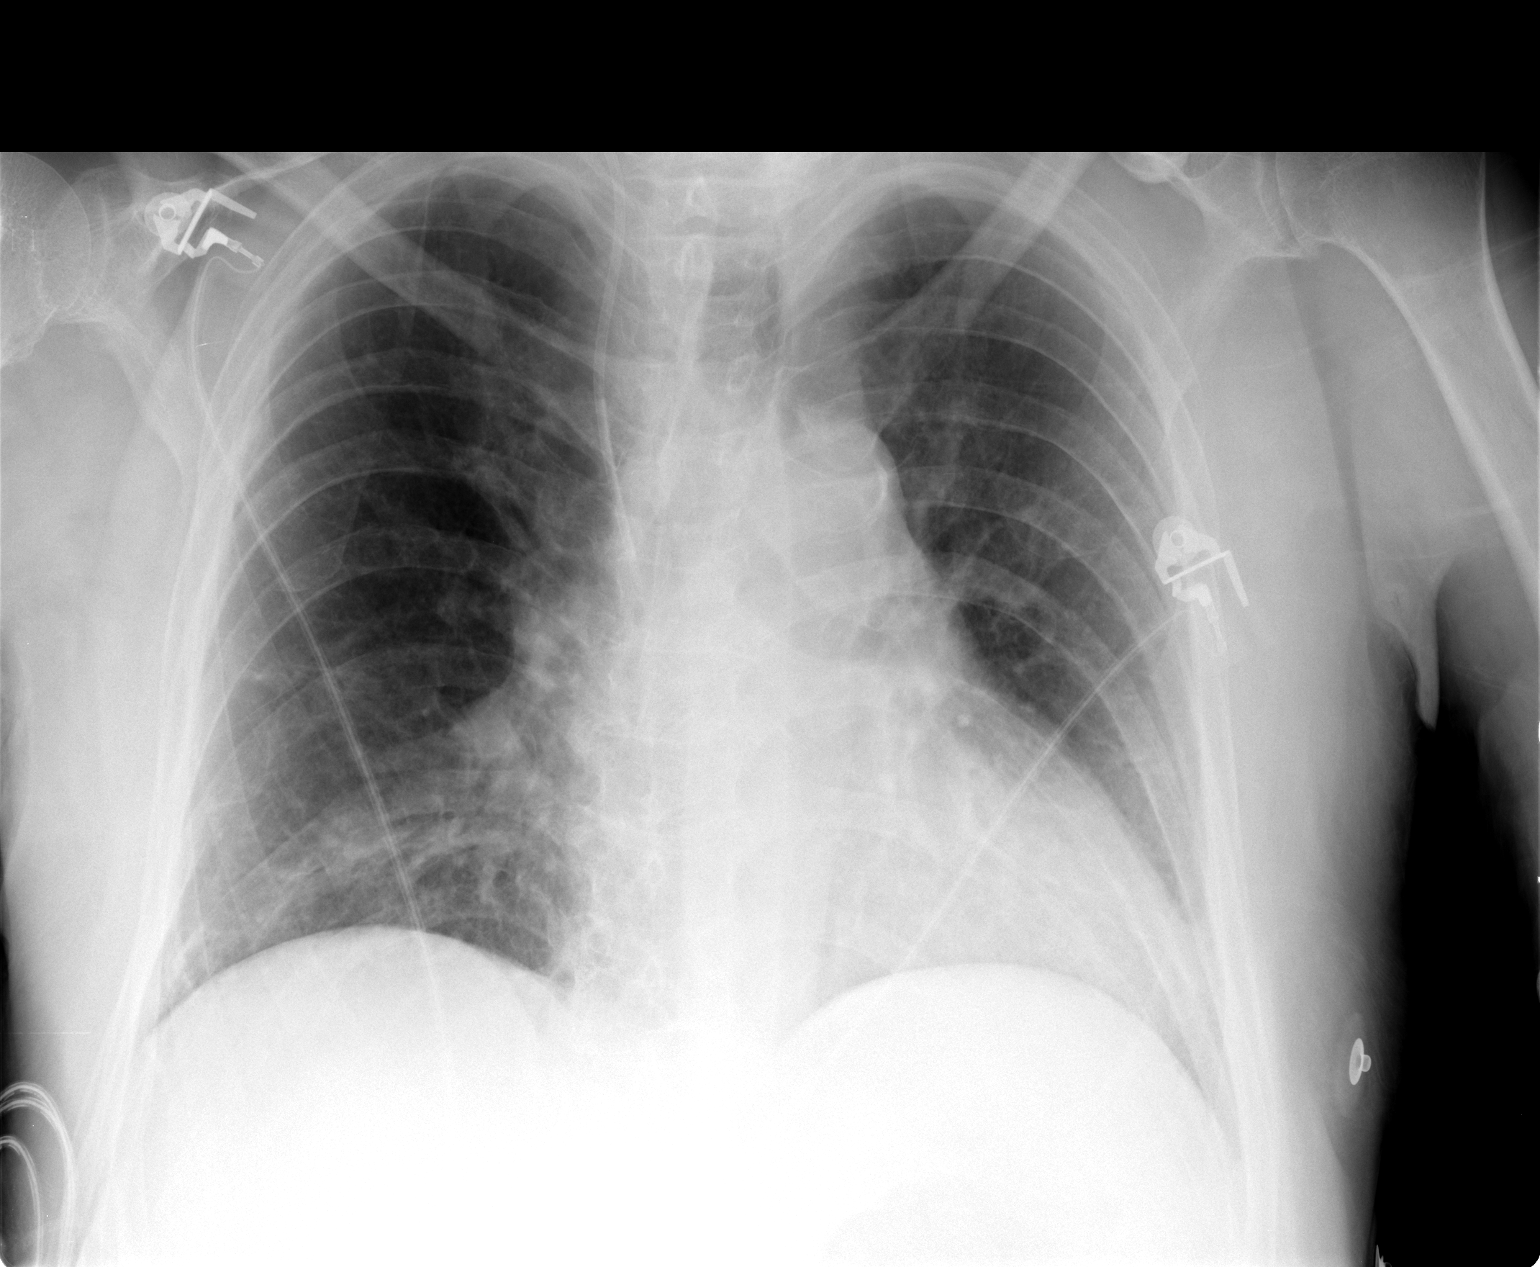

[1 of 1 positions shown; findings below may reference images not displayed]

FINDINGS: Right IJ catheter tip is within the superior vena cava, 2 cm proximal to the superior vena cava/right atrial junction.  Negative for pneumothorax.  Perihilar and right lower lobe subsegmental atelectasis.  Mild cardiac enlargement.
IMPRESSION: Catheter is in satisfactory position without pneumothorax.  Right lower lobe subsegmental atelectasis.
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
## 2008-02-26 IMAGING — CR DG FEMUR 2+V PORT*L*
3 series · 3 of 3 positions shown · non-contrast
Comparison: none

CLINICAL DATA: Postop reduction femur fracture.
 LEFT FEMUR ?   4 VIEW:

[view not recorded (1 of 3)]
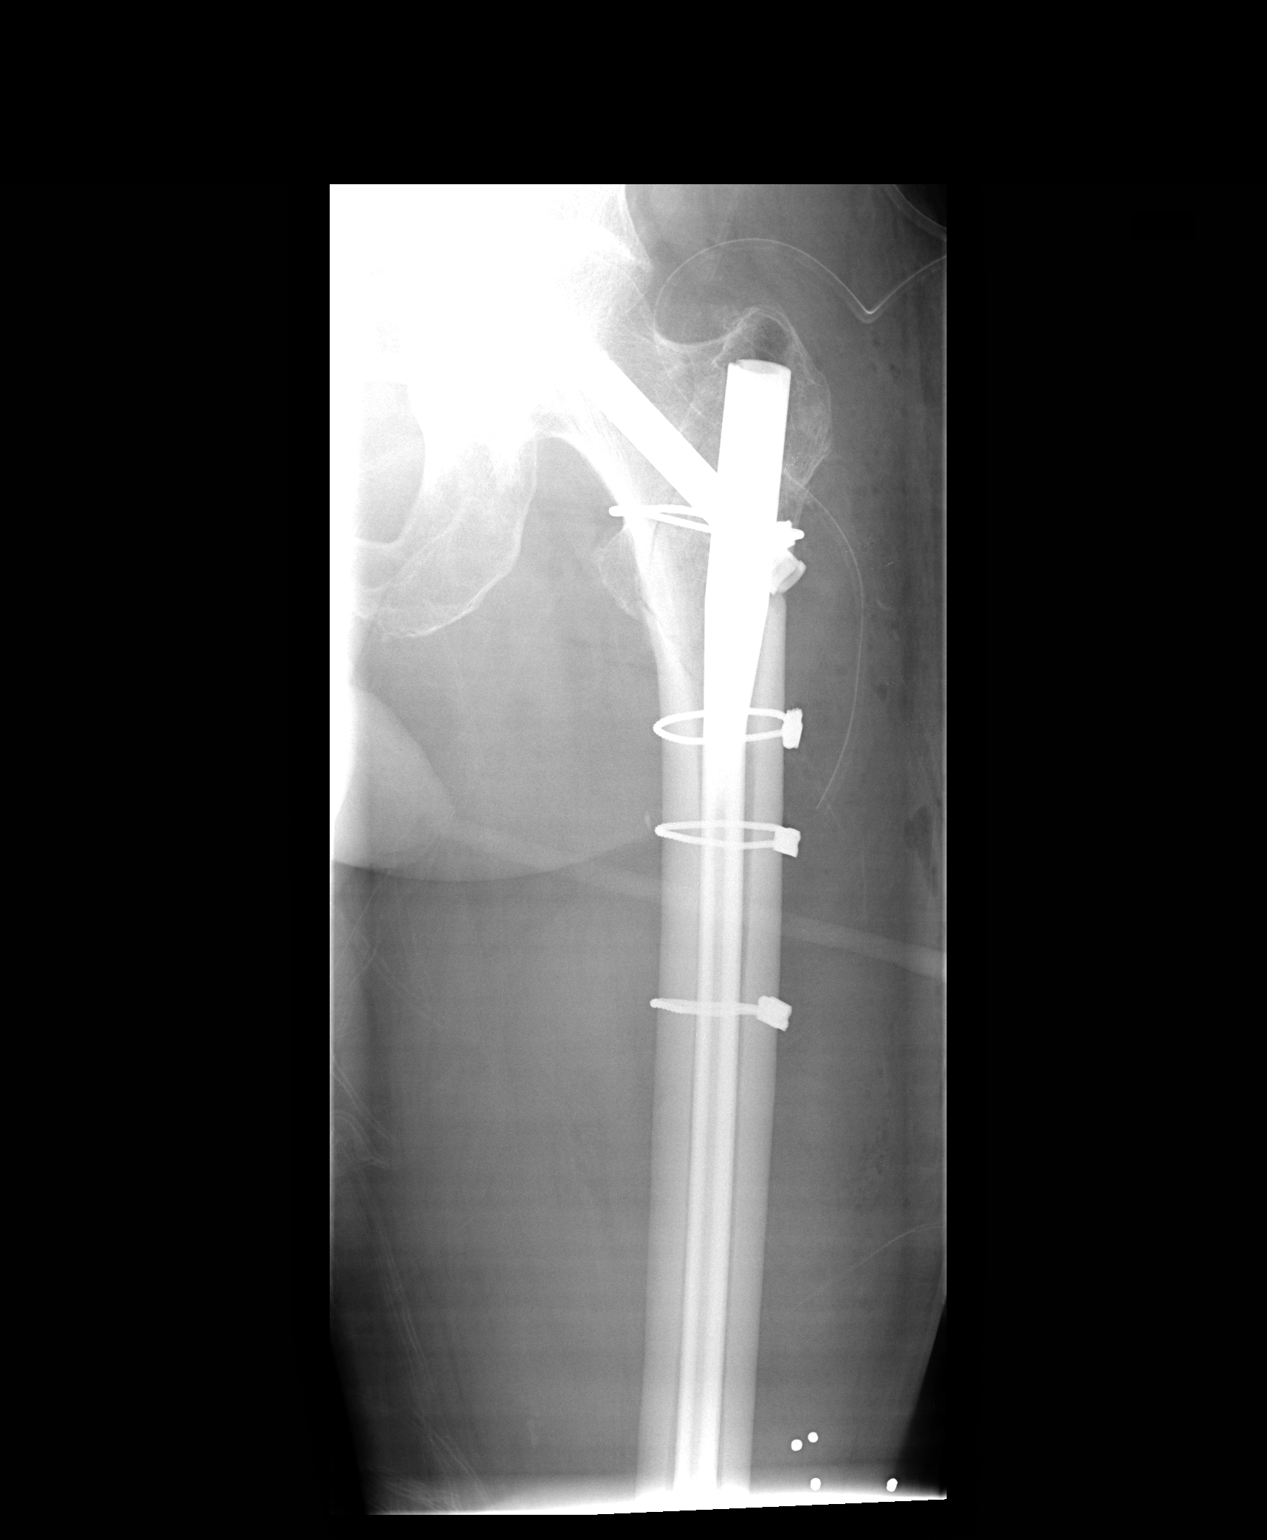

[view not recorded (2 of 3)]
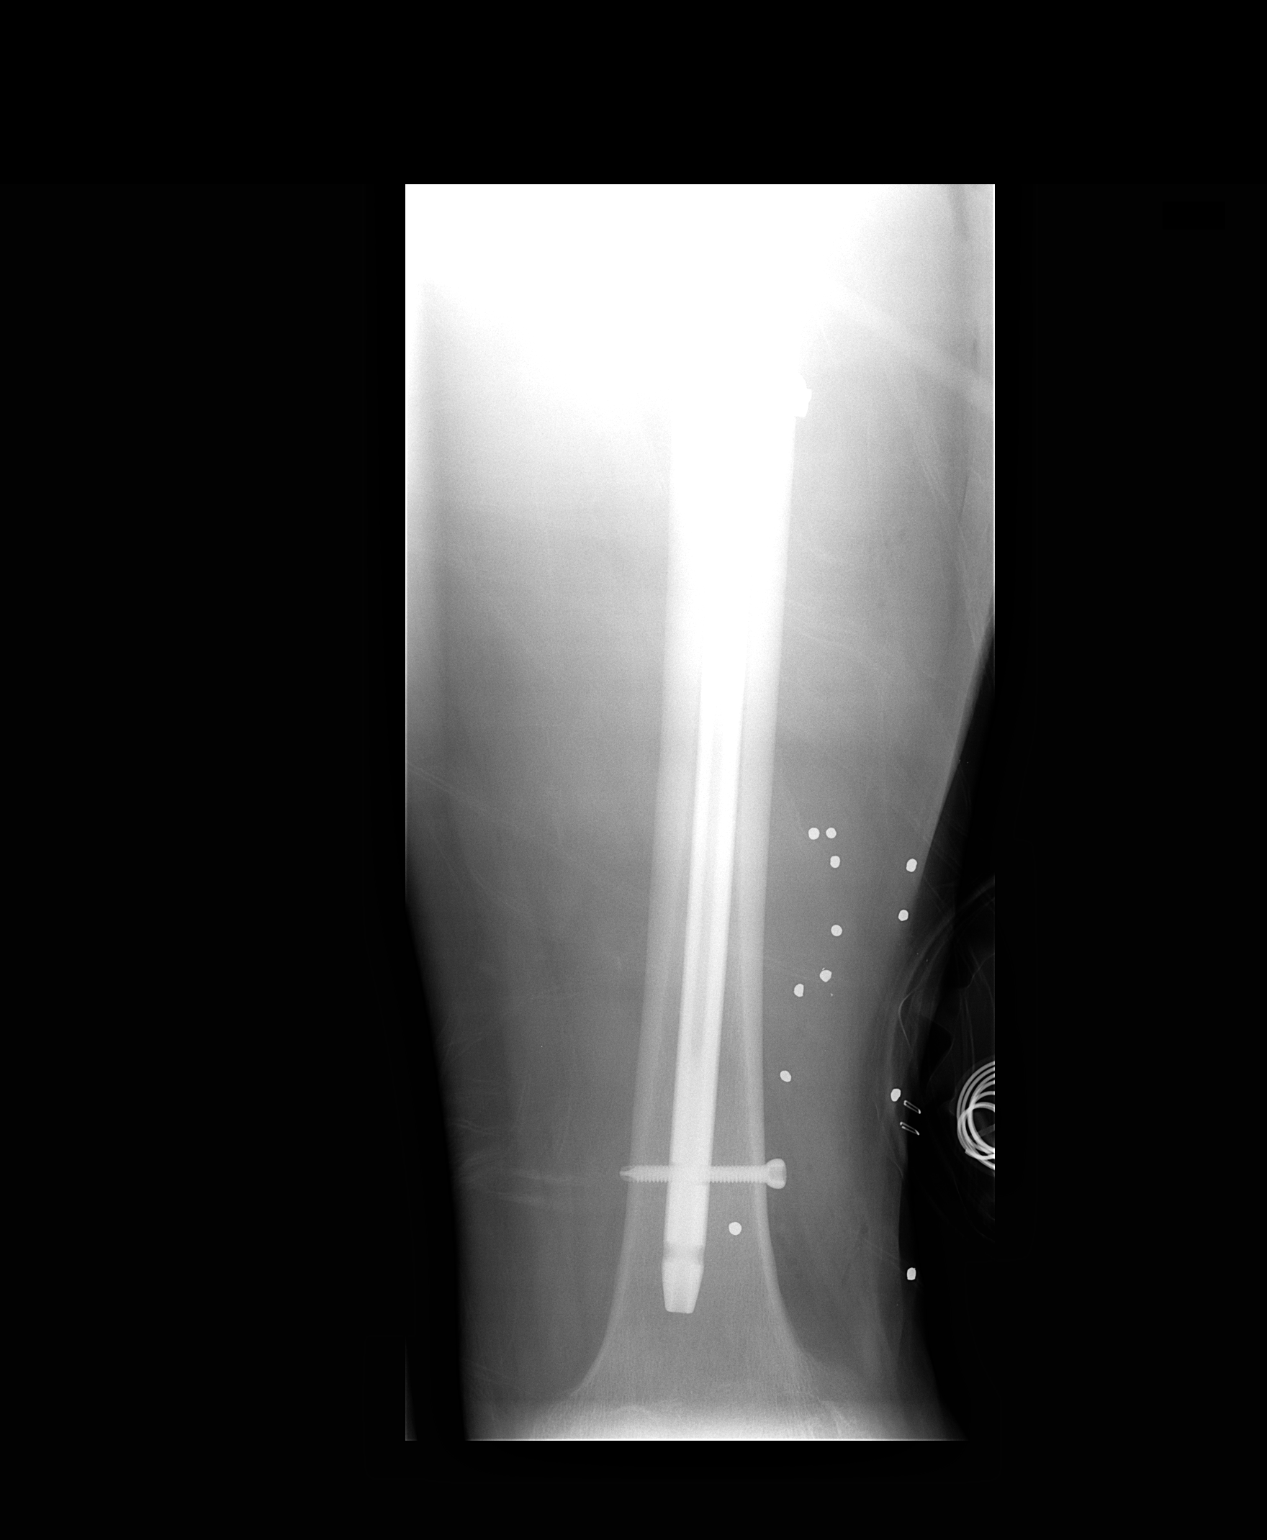

[view not recorded (3 of 3)]
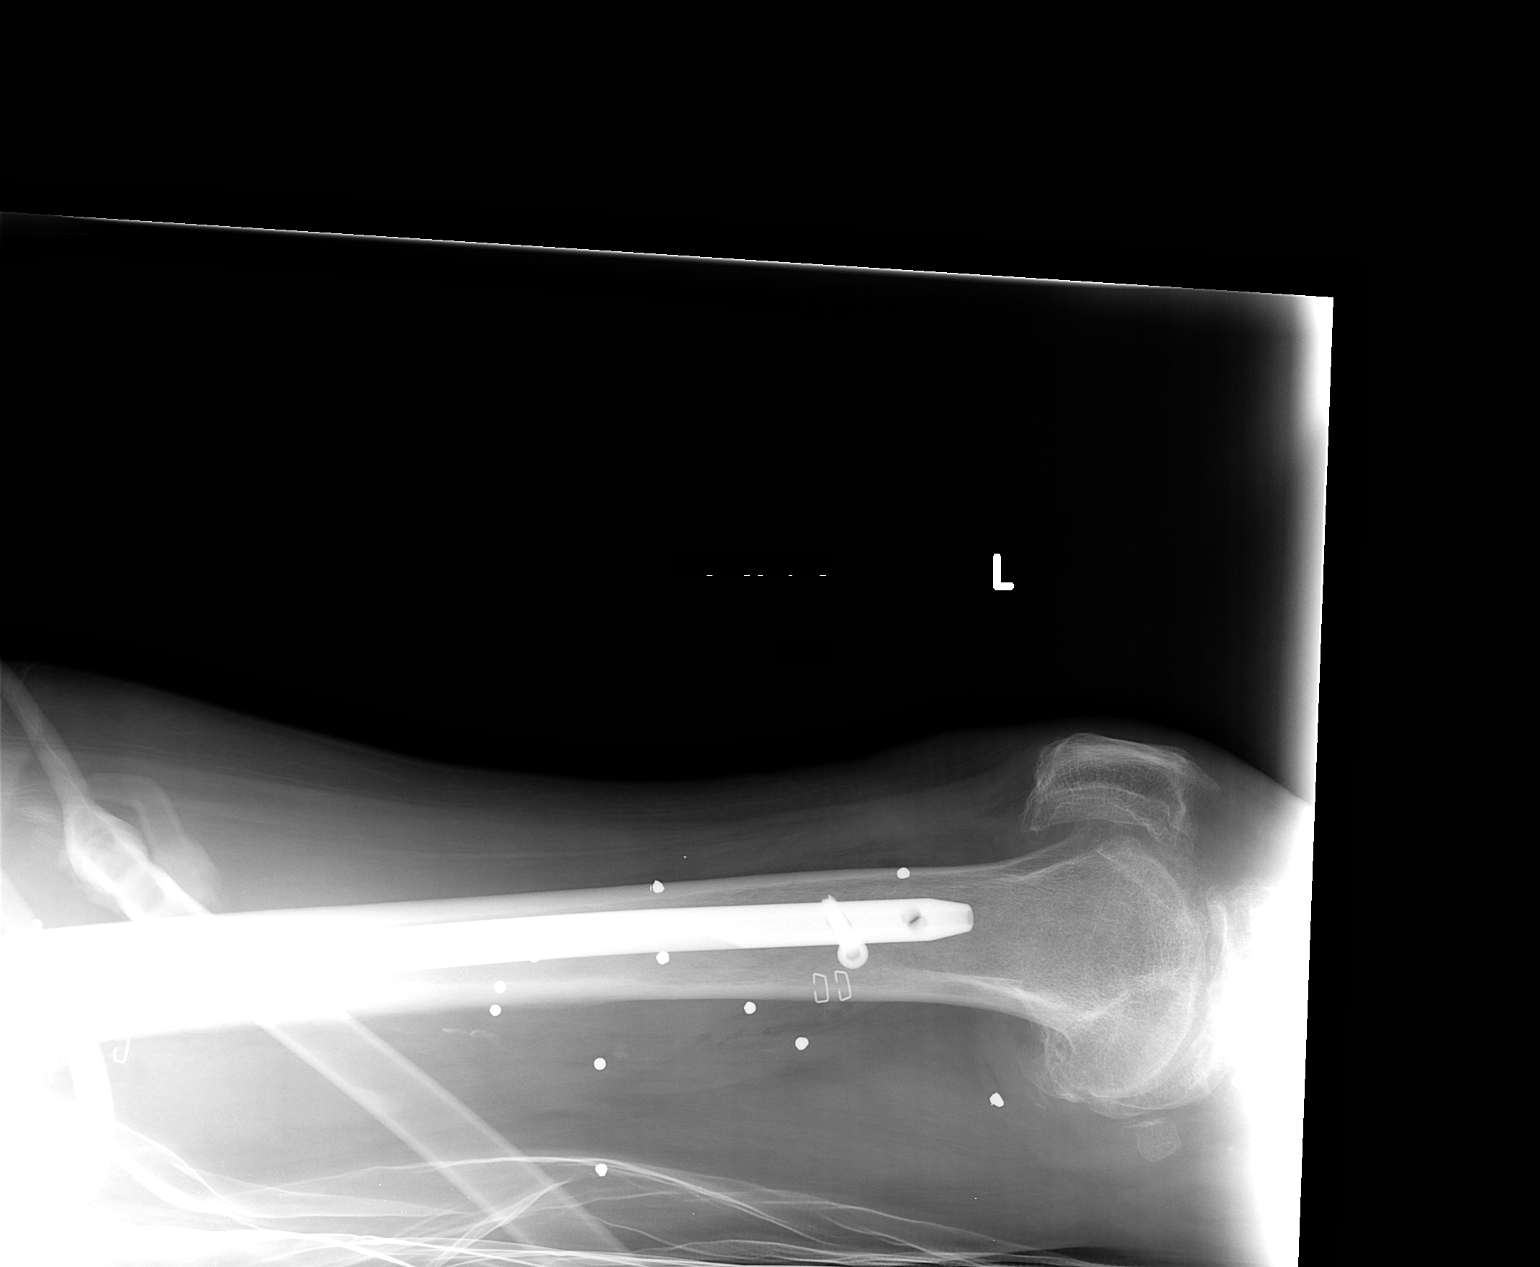

[3 of 3 positions shown; findings below may reference images not displayed]

FINDINGS: Interlocking compression IM nail well position.  Alignment anatomic.  Surgical drains appreciated.
IMPRESSION: IM compression nail well position.  Alignment anatomic.
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
## 2008-02-27 IMAGING — CR DG CHEST 1V PORT
1 series · 1 of 1 positions shown · non-contrast
Comparison: none

CLINICAL DATA: Followup. 
 PORTABLE CHEST ? 1 VIEW ? 04/15/06 ? 3943 HOURS:

[view not recorded]
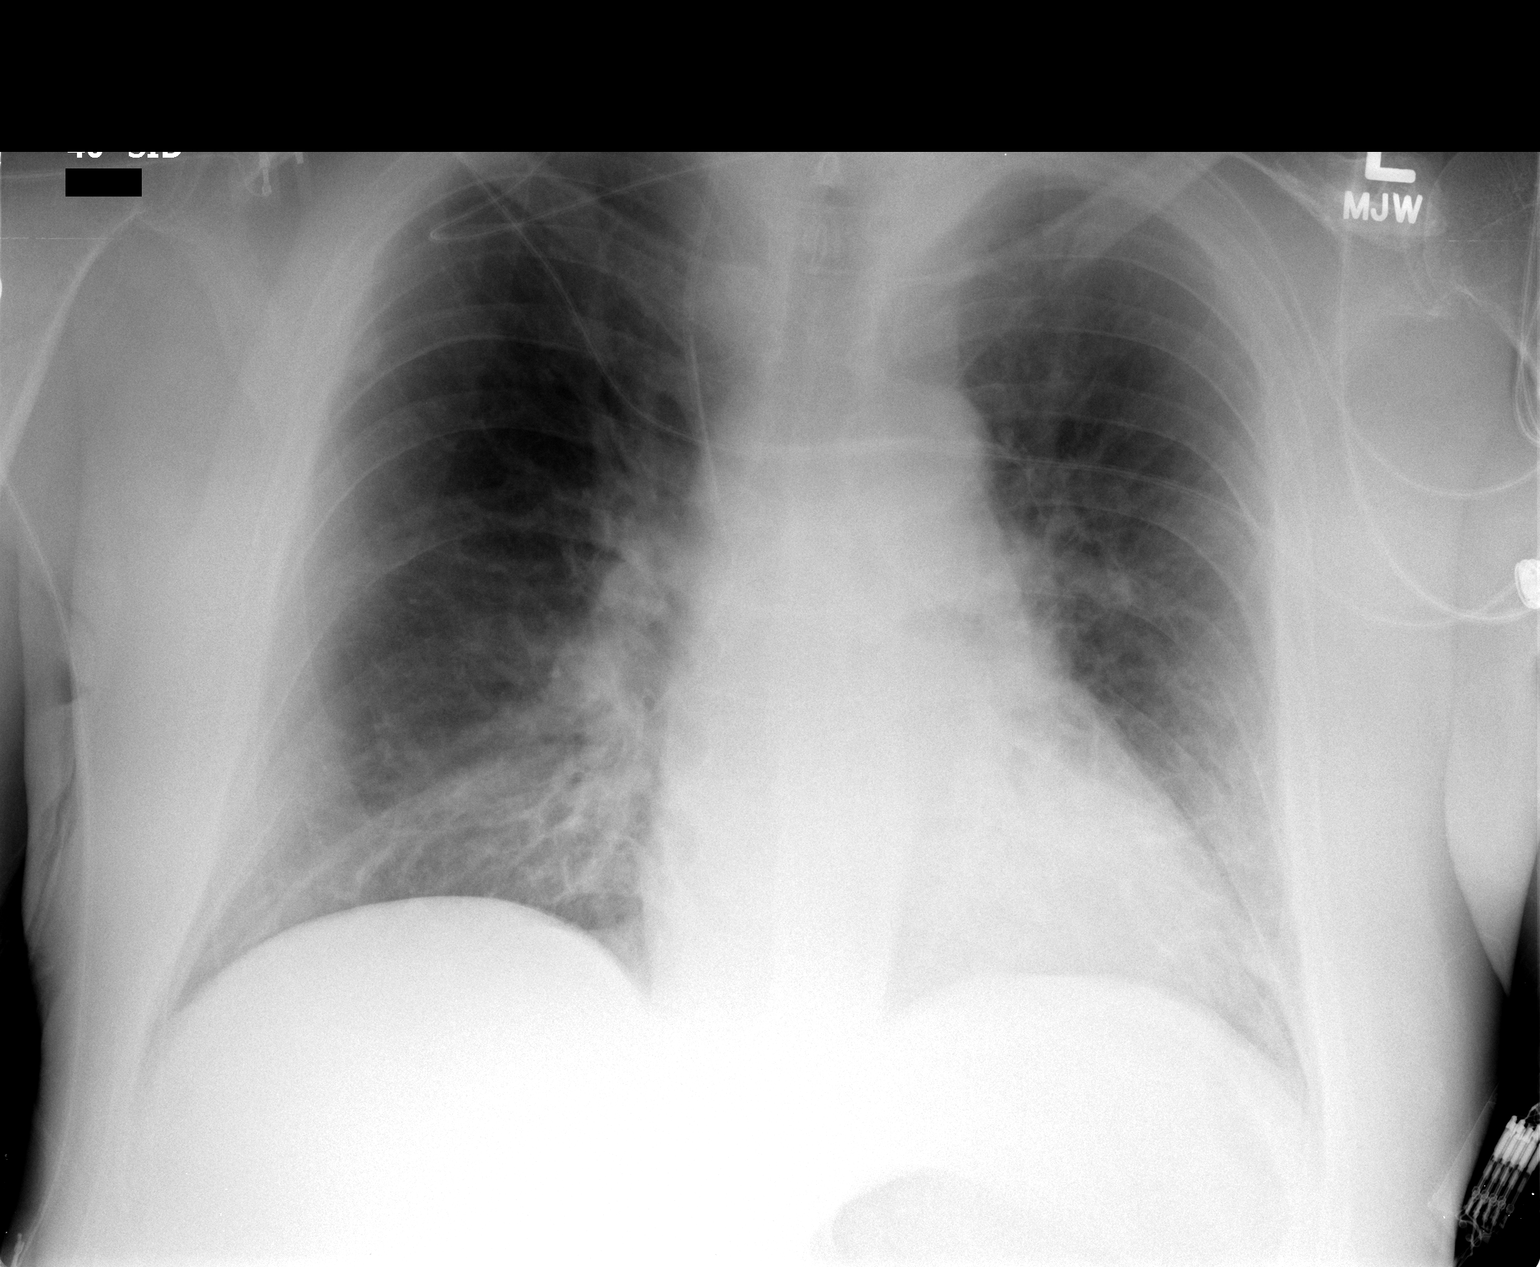

[1 of 1 positions shown; findings below may reference images not displayed]

FINDINGS: The heart is upper normal in size.  A right IJ central venous catheter is present.  The tip is in the upper SVC.  There is no pneumothorax.  Bibasilar atelectasis has worsened.  No obvious rib deformities.
IMPRESSION: Bibasilar atelectasis.

## 2008-02-28 IMAGING — CR DG CHEST 1V PORT
1 series · 1 of 1 positions shown · non-contrast
Comparison: 04/16/06.

CLINICAL DATA: Increasing respiratory difficulty.  Endotracheal tube placement. 
 PORTABLE CHEST ? 1 VIEW:

[view not recorded]
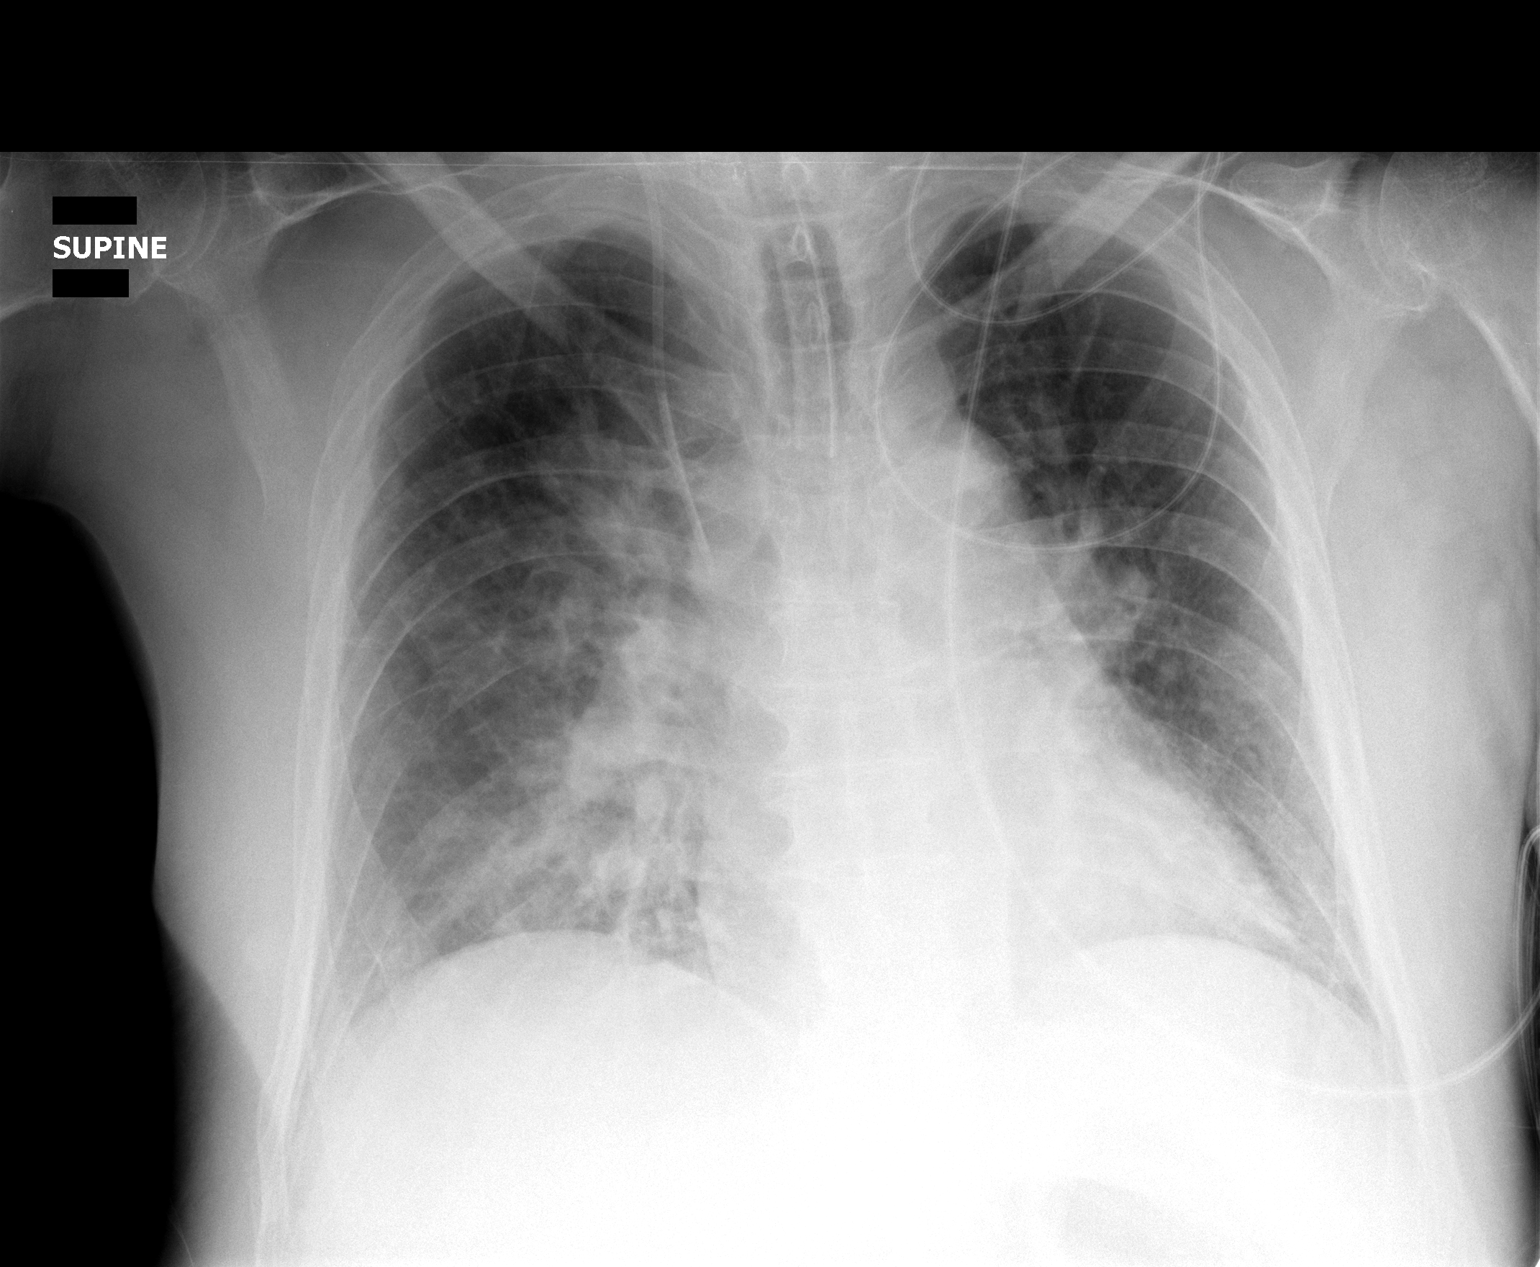

[1 of 1 positions shown; findings below may reference images not displayed]

FINDINGS: An endotracheal tube has been placed with tip 3 cm above the carina.  Increasing vascular congestion and edema noted.  Continued right perihilar and right lower lung airspace disease noted.  A right central venous catheter and mild cardiomegaly are stable.
IMPRESSION: 1.  Endotracheal tube with tip 3 cm above the carina. 
 2.  Increasing vascular congestion and mild edema.

## 2008-02-28 IMAGING — CR DG CHEST 1V PORT
1 series · 1 of 1 positions shown · non-contrast
Comparison: 04/15/06.

CLINICAL DATA: Femoral fracture.  Followup atelectasis.
 PORTABLE CHEST - 1 VIEW, 04/16/06 AT 8778 HOURS:

[view not recorded]
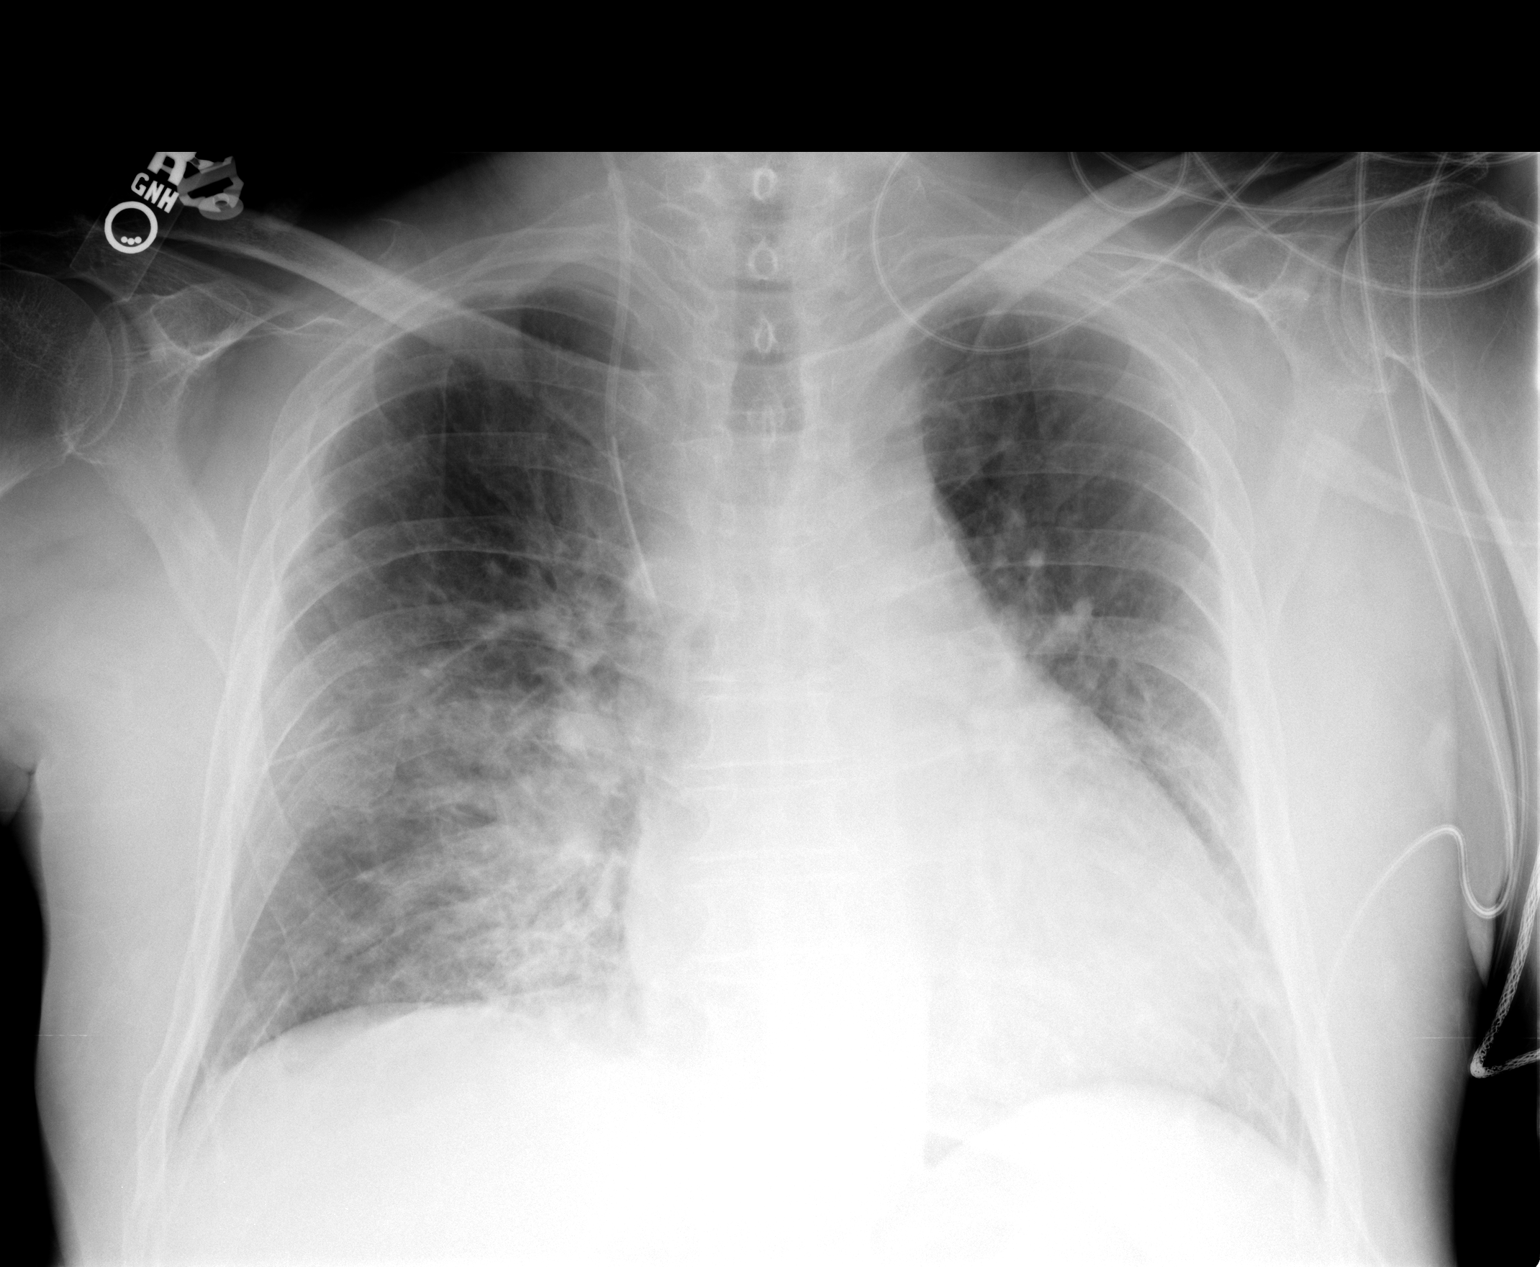

[1 of 1 positions shown; findings below may reference images not displayed]

FINDINGS: There is now more opacity, particularly in the right perihilar region and medial right lung base.  Although this may represent atelectasis, pneumonia is a definite consideration.  Cardiomegaly is stable.  Right central venous line is unchanged.
IMPRESSION: Increasing right perihilar and right basilar opacity, suspicious for pneumonia.  Suggest followup 2-view chest x-ray.

## 2008-02-29 IMAGING — CR DG CHEST 1V PORT
1 series · 1 of 1 positions shown · non-contrast
Comparison: Yesterday?s exam.

CLINICAL DATA: Left femoral neck fracture, closed.  Assess chest status.
PORTABLE CHEST - 1 VIEW, 7277 HOURS:

[view not recorded]
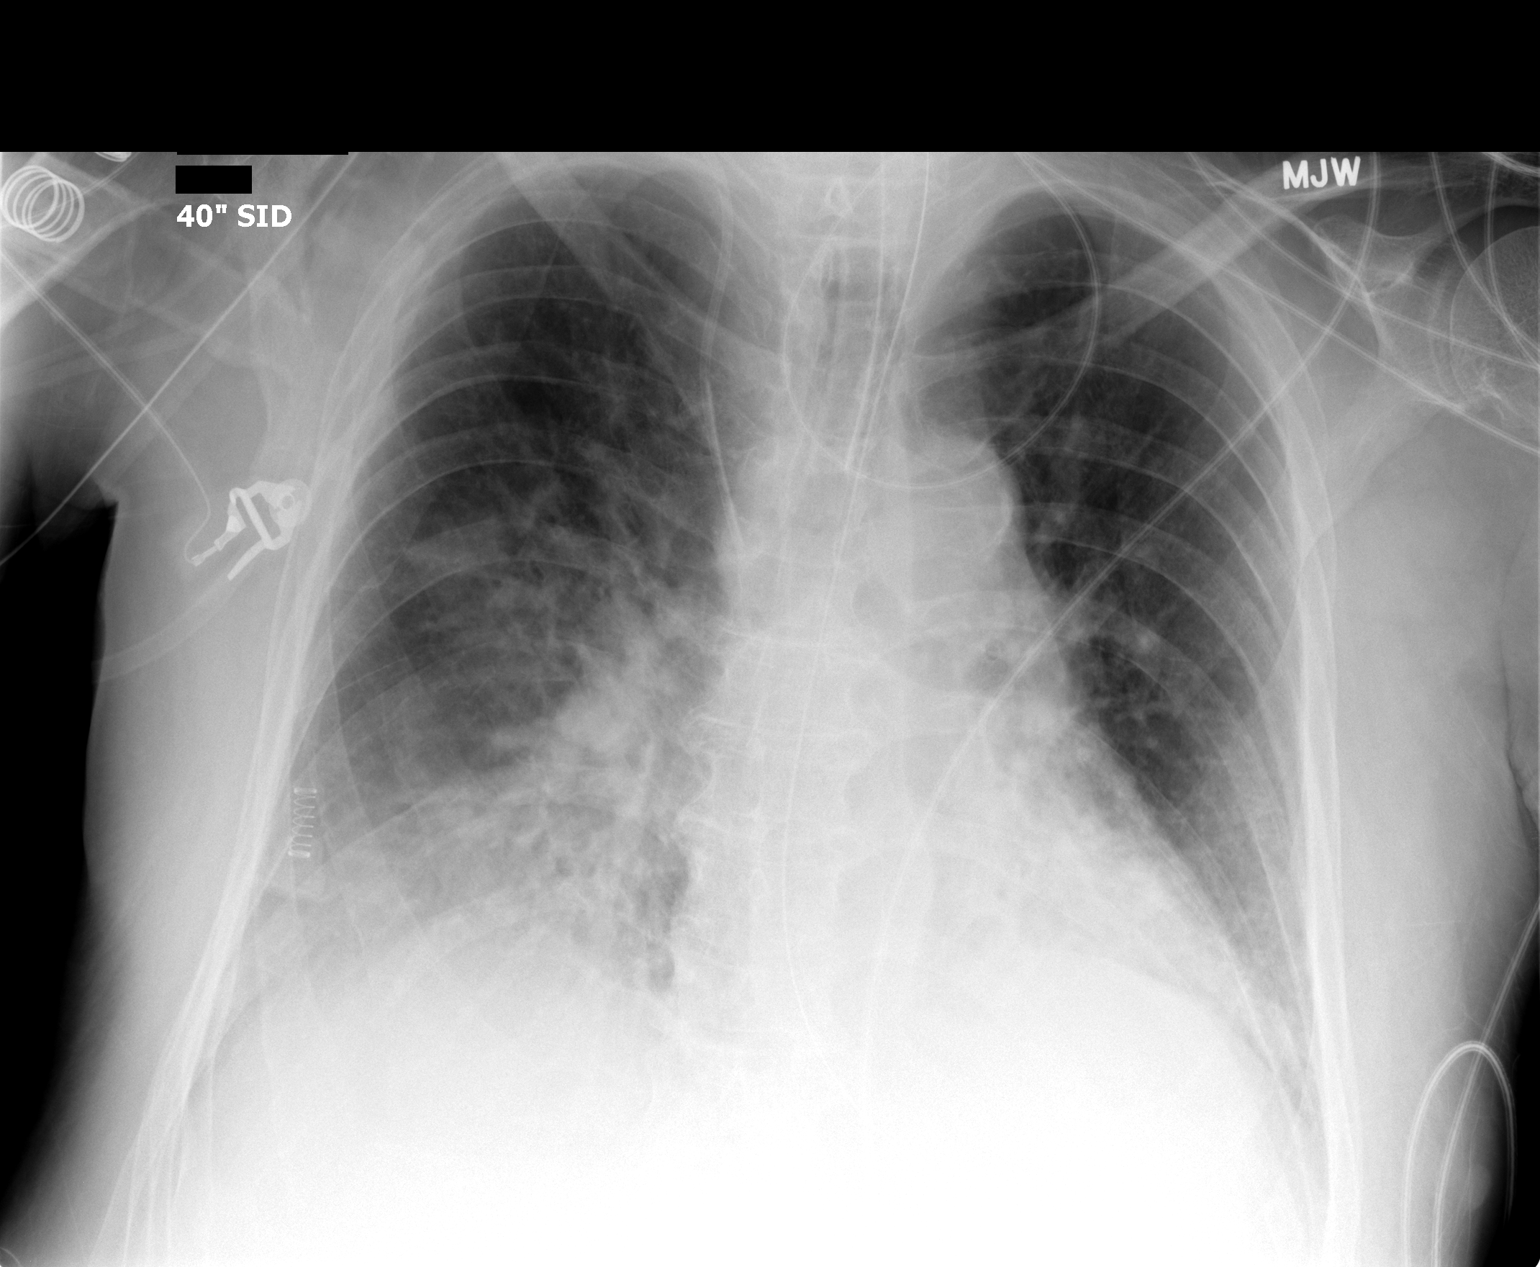

[1 of 1 positions shown; findings below may reference images not displayed]

FINDINGS: Satisfactory ET tube position.  Right IJ CVC tip is in the SVC.  Moderate pulmonary vascular congestion.  Streaky densities, lower lung zones, compatible with subsegmental atelectasis.  Subtle air space disease may be present.  No significant interval change has occurred.  No pneumothorax.
IMPRESSION: Mild atelectatic changes and mild air space disease are again noted.  Minimal improved aeration, upper lung zones.

## 2008-02-29 IMAGING — CR DG KNEE 1-2V*R*
2 series · 2 of 2 positions shown · non-contrast
Comparison: none

CLINICAL DATA: Status post fall.  Question femoral neck fracture.  
 PORTABLE RIGHT KNEE - 2 VIEW:

[view not recorded (1 of 2)]
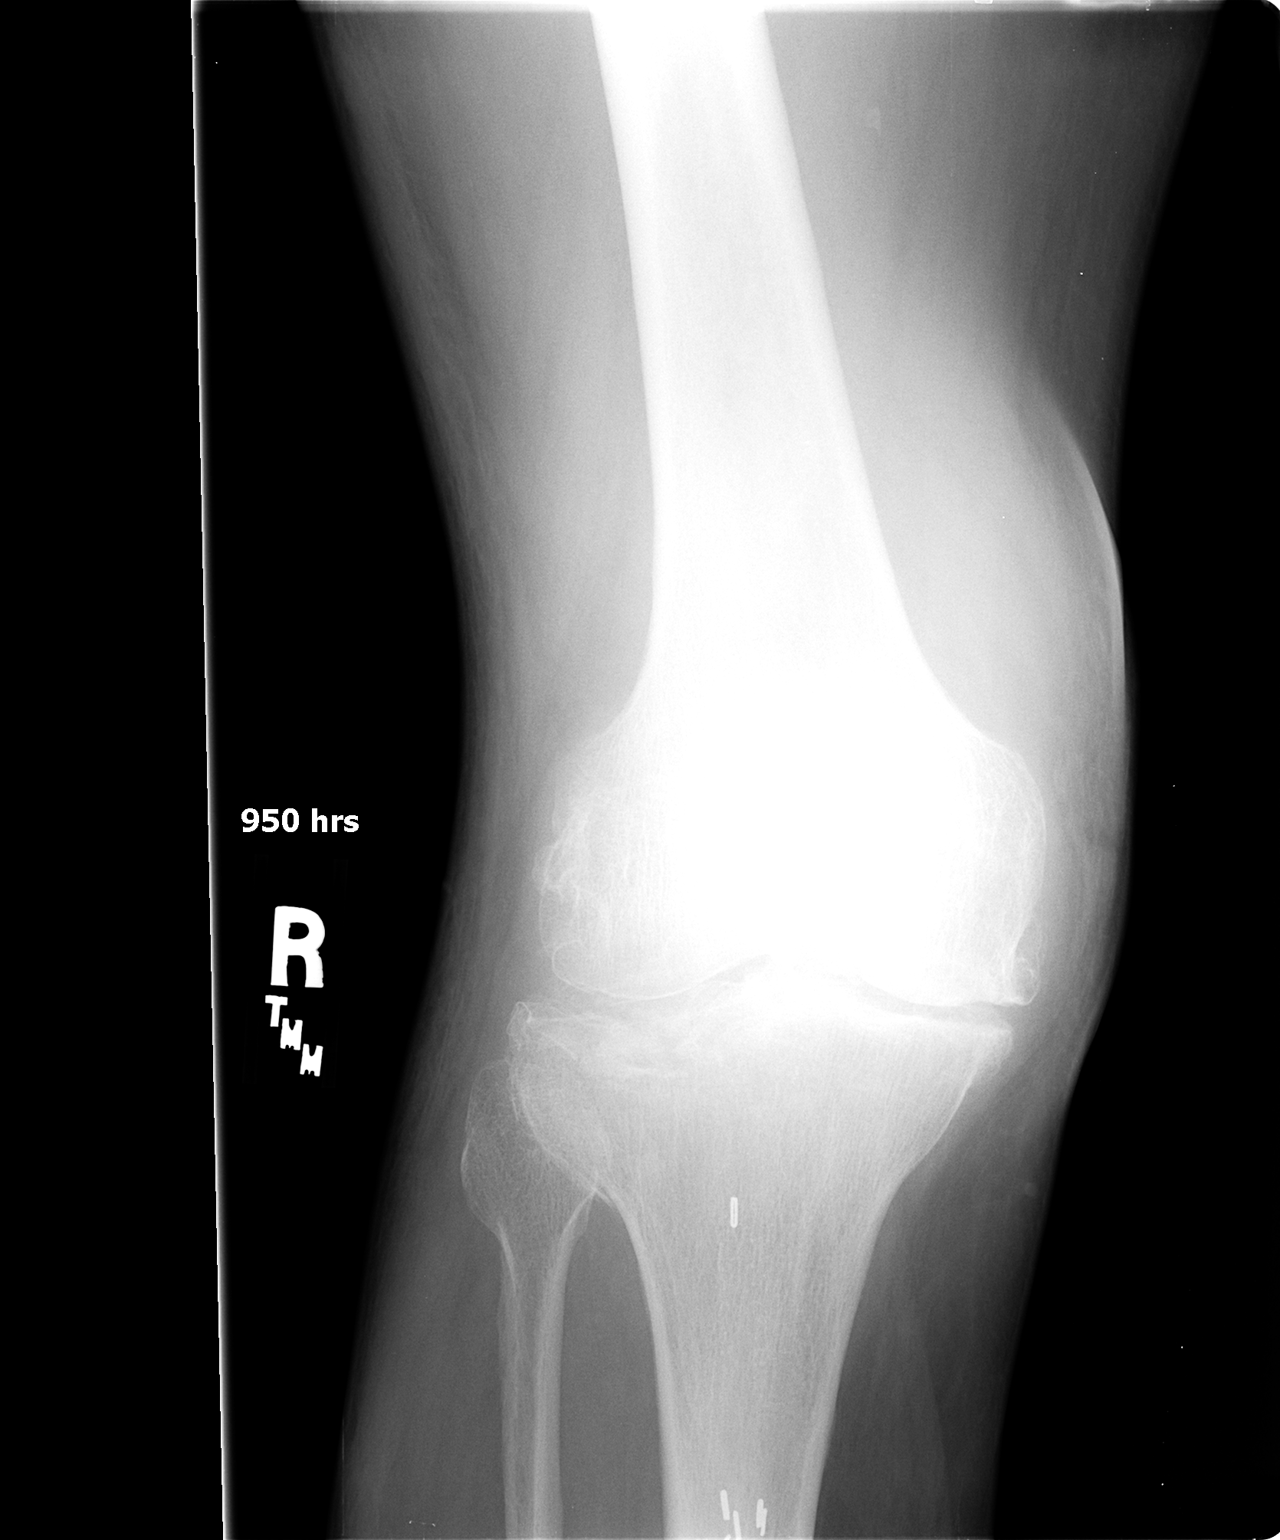

[view not recorded (2 of 2)]
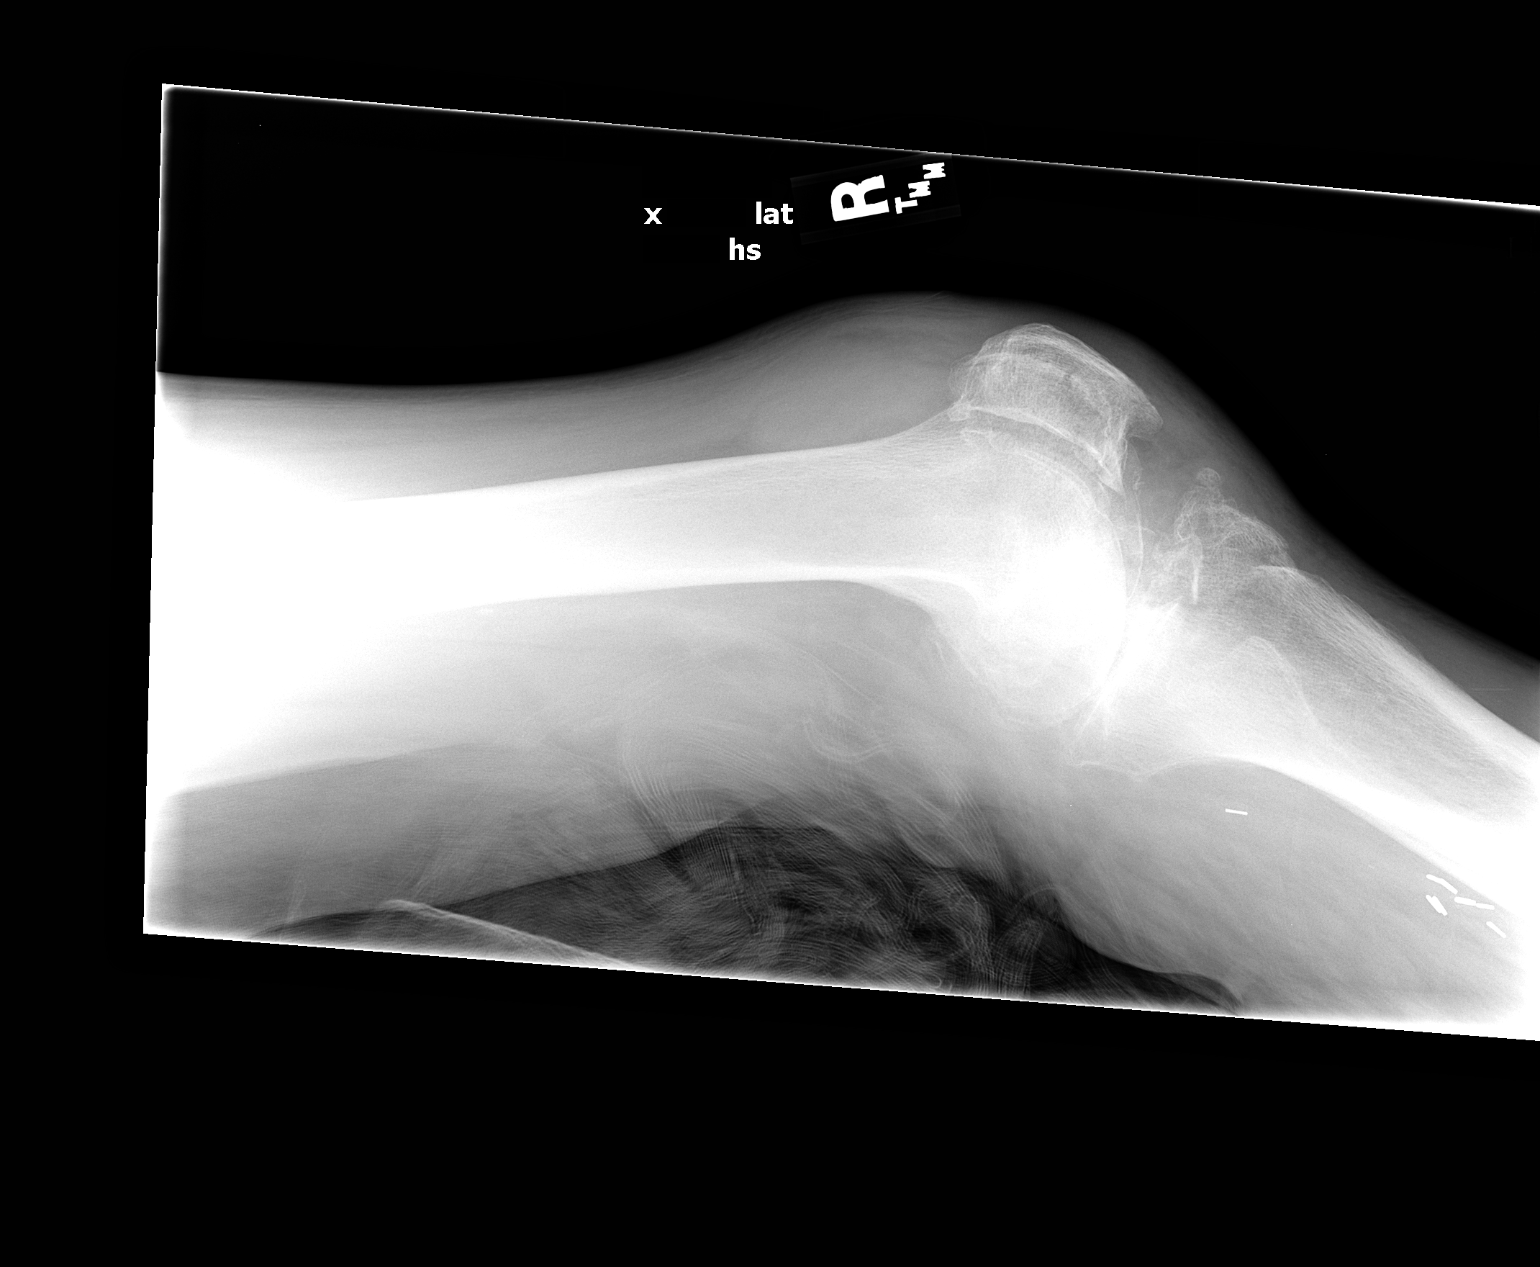

[2 of 2 positions shown; findings below may reference images not displayed]

FINDINGS: There is a depressed intraarticular fracture involving the lateral tibial plateau anteriorly. There is an associated large hemarthrosis.  No other acute fractures or dislocations are demonstrated.  Advanced tricompartmental degenerative changes are present and vascular clips are noted in the proximal lower leg.
IMPRESSION: Depressed intra-articular fracture of the lateral tibial plateau with associated hemarthrosis.  
 RIGHT FEMUR - 2 VIEW:
 Portable views of the remainder of the right femur demonstrate no acute fracture or dislocation.  There are relatively mild degenerative changes at the hip joint with small vascular clips.
IMPRESSION: No evidence of acute fracture or dislocation of the right femur.
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
## 2008-03-02 IMAGING — CR DG ABD PORTABLE 1V
1 series · 1 of 1 positions shown · non-contrast
Comparison: 04/19/06.

CLINICAL DATA: Evaluate Panda tube placement. 
 PORTABLE ABDOMEN ?1   VIEW:

[view not recorded]
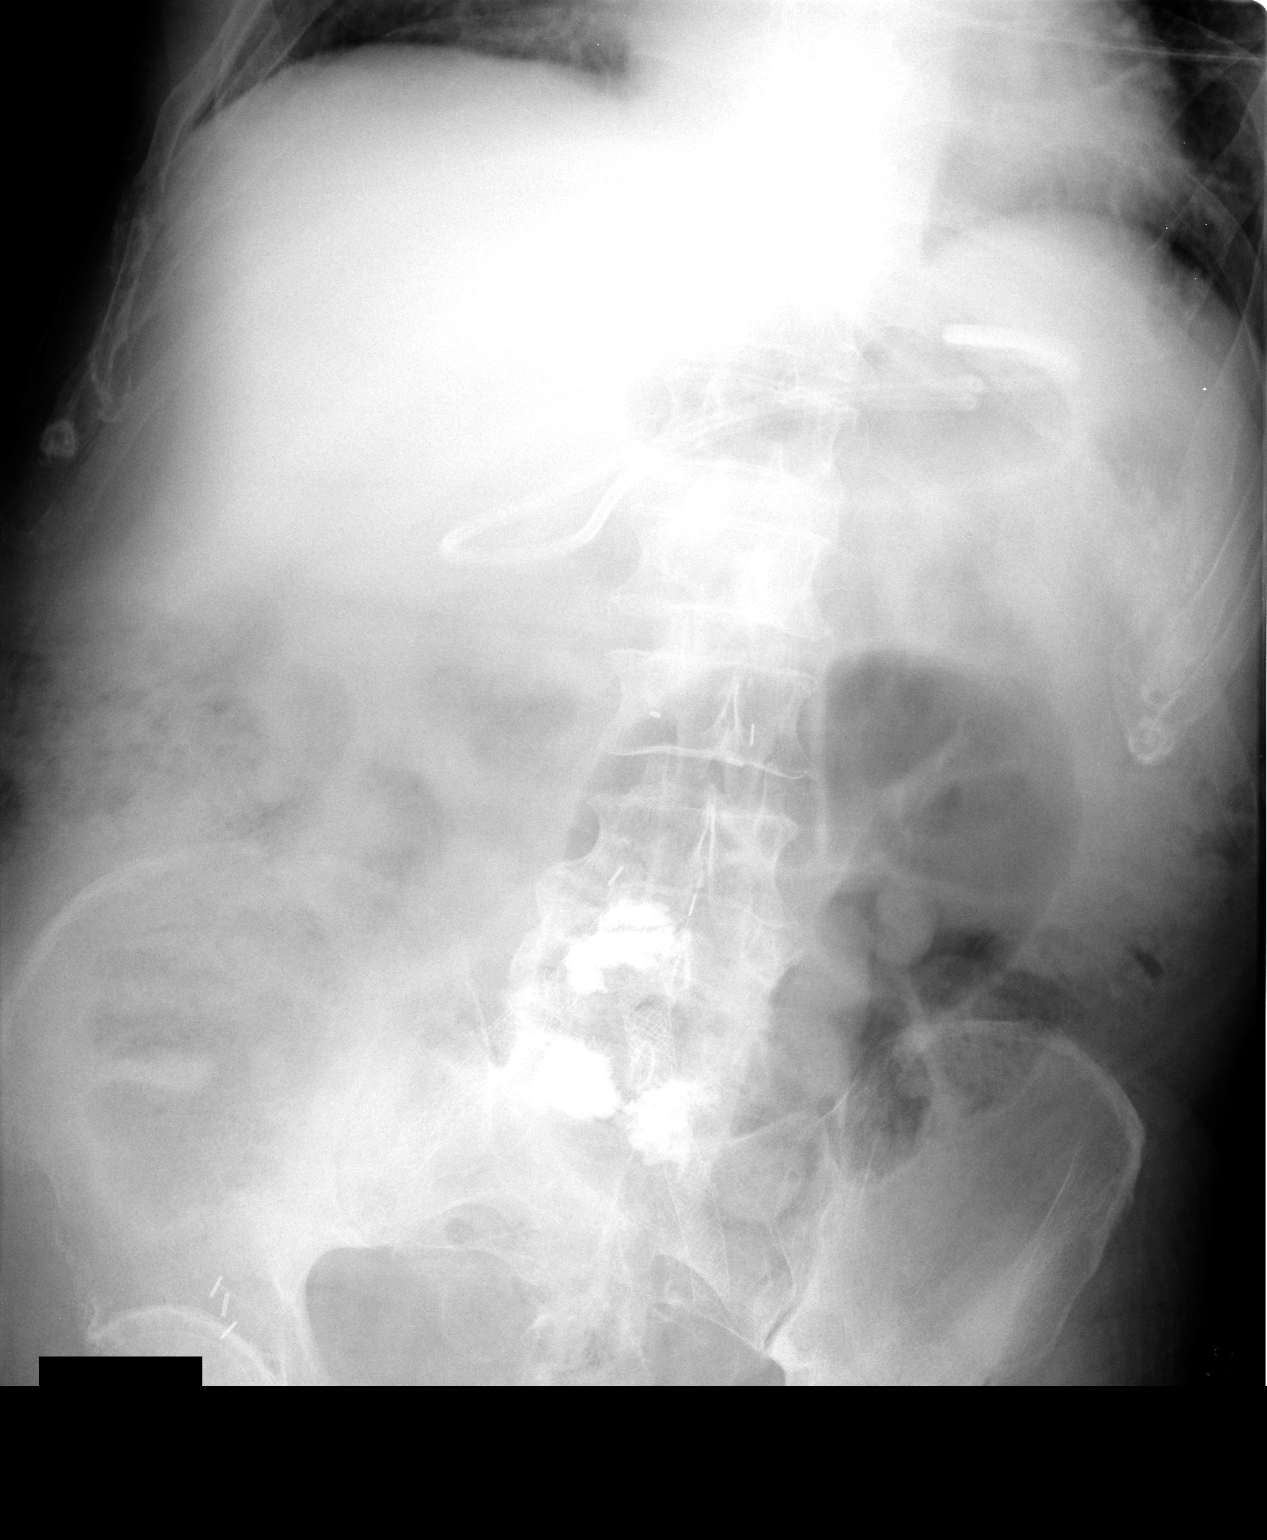

[1 of 1 positions shown; findings below may reference images not displayed]

FINDINGS: There is a Panda tube, which is coiled in the stomach.  The tip is in the projection of the proximal stomach.  The nasogastric tube appears to have been removed.  The bowel gas pattern is nonspecific with a persistent loop of bowel in the midabdomen, which is likely a large bowel loop.
IMPRESSION: Panda tube is coiled in the stomach with the tip in the proximal stomach.

## 2008-03-02 IMAGING — CR DG ABD PORTABLE 1V
1 series · 1 of 1 positions shown · non-contrast
Comparison: none

CLINICAL DATA: Panda tube placement. Femoral neck fracture. 
 PORTABLE ABDOMEN ? 1 VIEW:

[view not recorded]
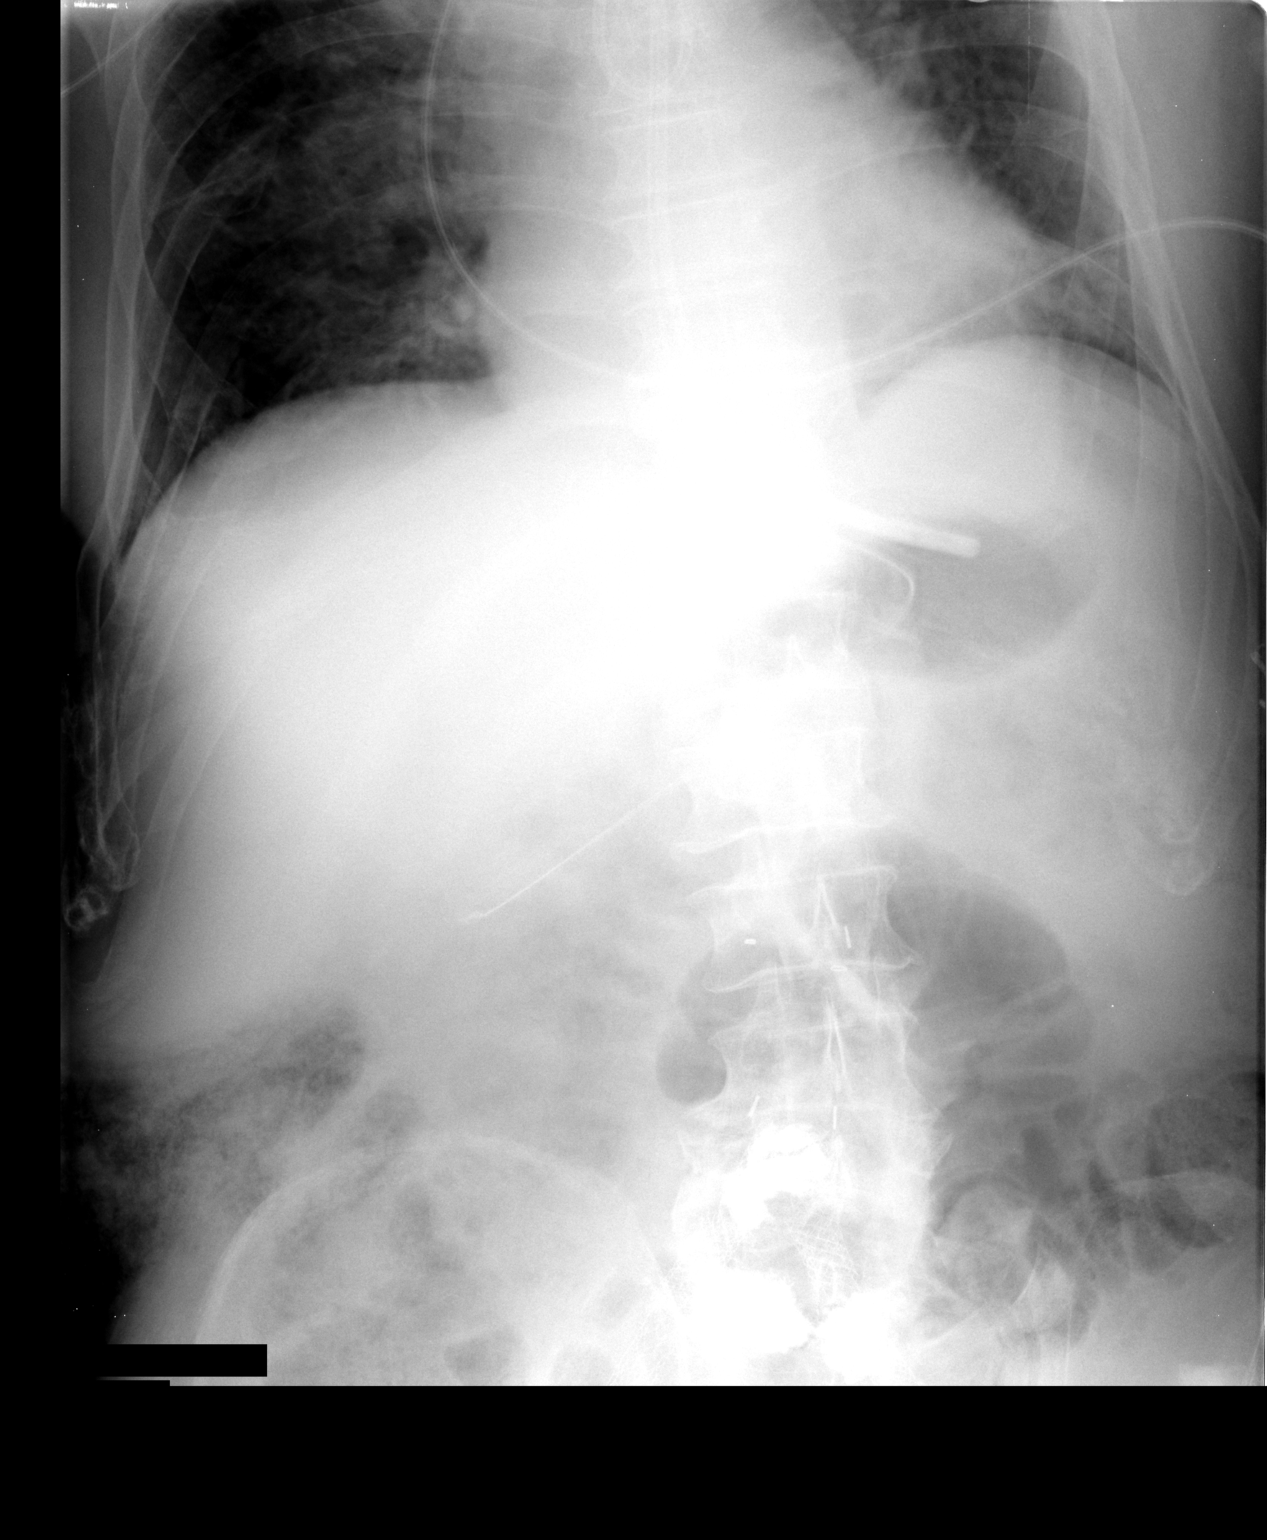

[1 of 1 positions shown; findings below may reference images not displayed]

FINDINGS: There is a nasogastric tube.  The side-port is below the GE junction. 
 There is a panda tube which appears partially coiled in the mid esophagus.  The tip of the panda tube is in the proximal stomach.   Recommend repositioning. 
 The bowel gas pattern is nonspecific and only partially visualized.
IMPRESSION: Panda tube is partially coiled in the mid esophagus.  Recommend repositioning.

## 2008-03-04 IMAGING — CR DG CHEST 1V PORT
1 series · 1 of 1 positions shown · non-contrast
Comparison: 04/19/06.

CLINICAL DATA: Femur fracture. 
 PORTABLE CHEST ? 1 VIEW:

[view not recorded]
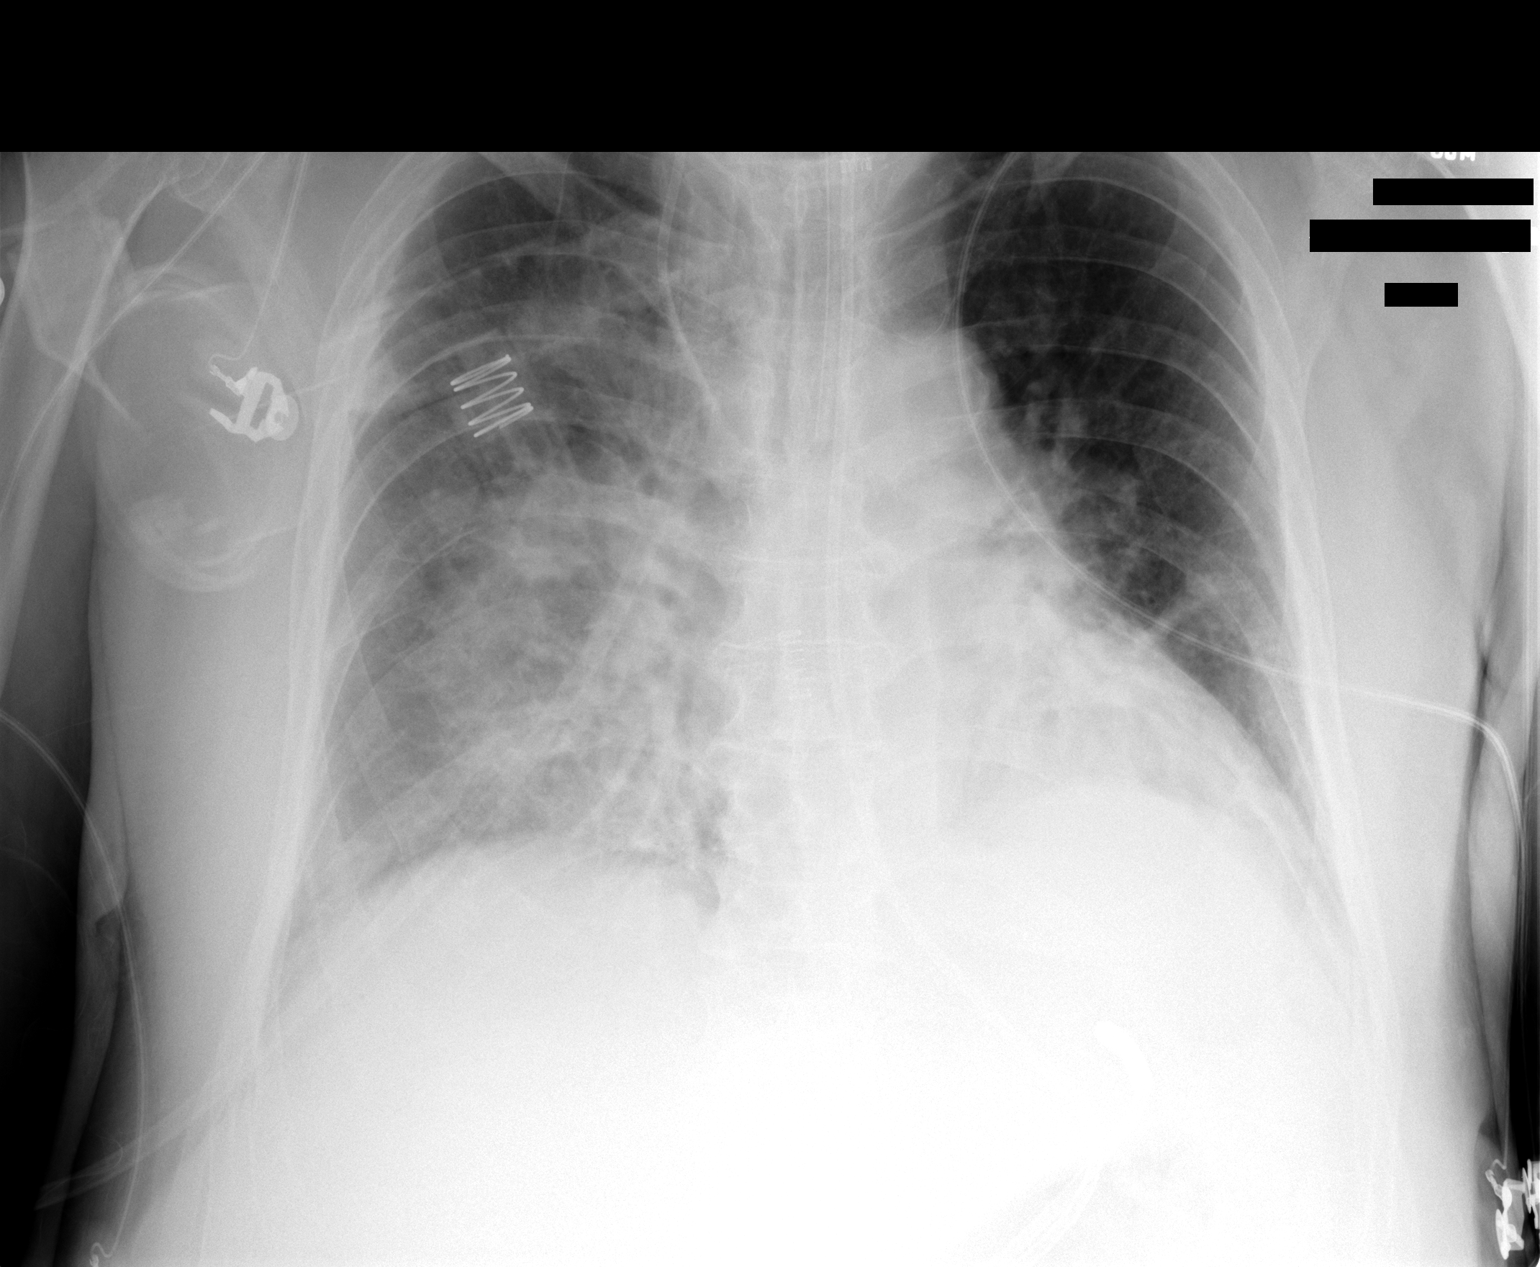

[1 of 1 positions shown; findings below may reference images not displayed]

FINDINGS: Endotracheal tube is now just above the carina.  This should be retracted approximately 3-4 cm for optimum positioning.  Feeding tube is looped in the stomach with the tip over the gastric body.  There is a right IJ catheter which is unchanged in position.  The trachea is midline and the heart is mildly enlarged.  Similar left sided atelectasis with small left sided pleural effusion.   Slight increase in perihilar opacity on the right likely due to alveolar edema.  No pneumothorax.  Layering right sided pleural effusion slightly larger.
IMPRESSION: 1.  Endotracheal tube now terminates just above the carina.  This should be retracted 3-4 cm for optimum positioning.  Findings were called to the patient?s nurse Howdechelle prior to this dictation [DATE] a.m. at 04/21/06.  
 2.  Worsening edema on the right with increased layering pleural effusion.  
 3.  Feeding tube looped in the stomach.

## 2008-03-05 IMAGING — CR DG CHEST 1V PORT
1 series · 1 of 1 positions shown · non-contrast
Comparison: 04/21/06.

CLINICAL DATA: Femur fracture.
 PORTABLE CHEST - 1 VIEW 04/22/06:

[view not recorded]
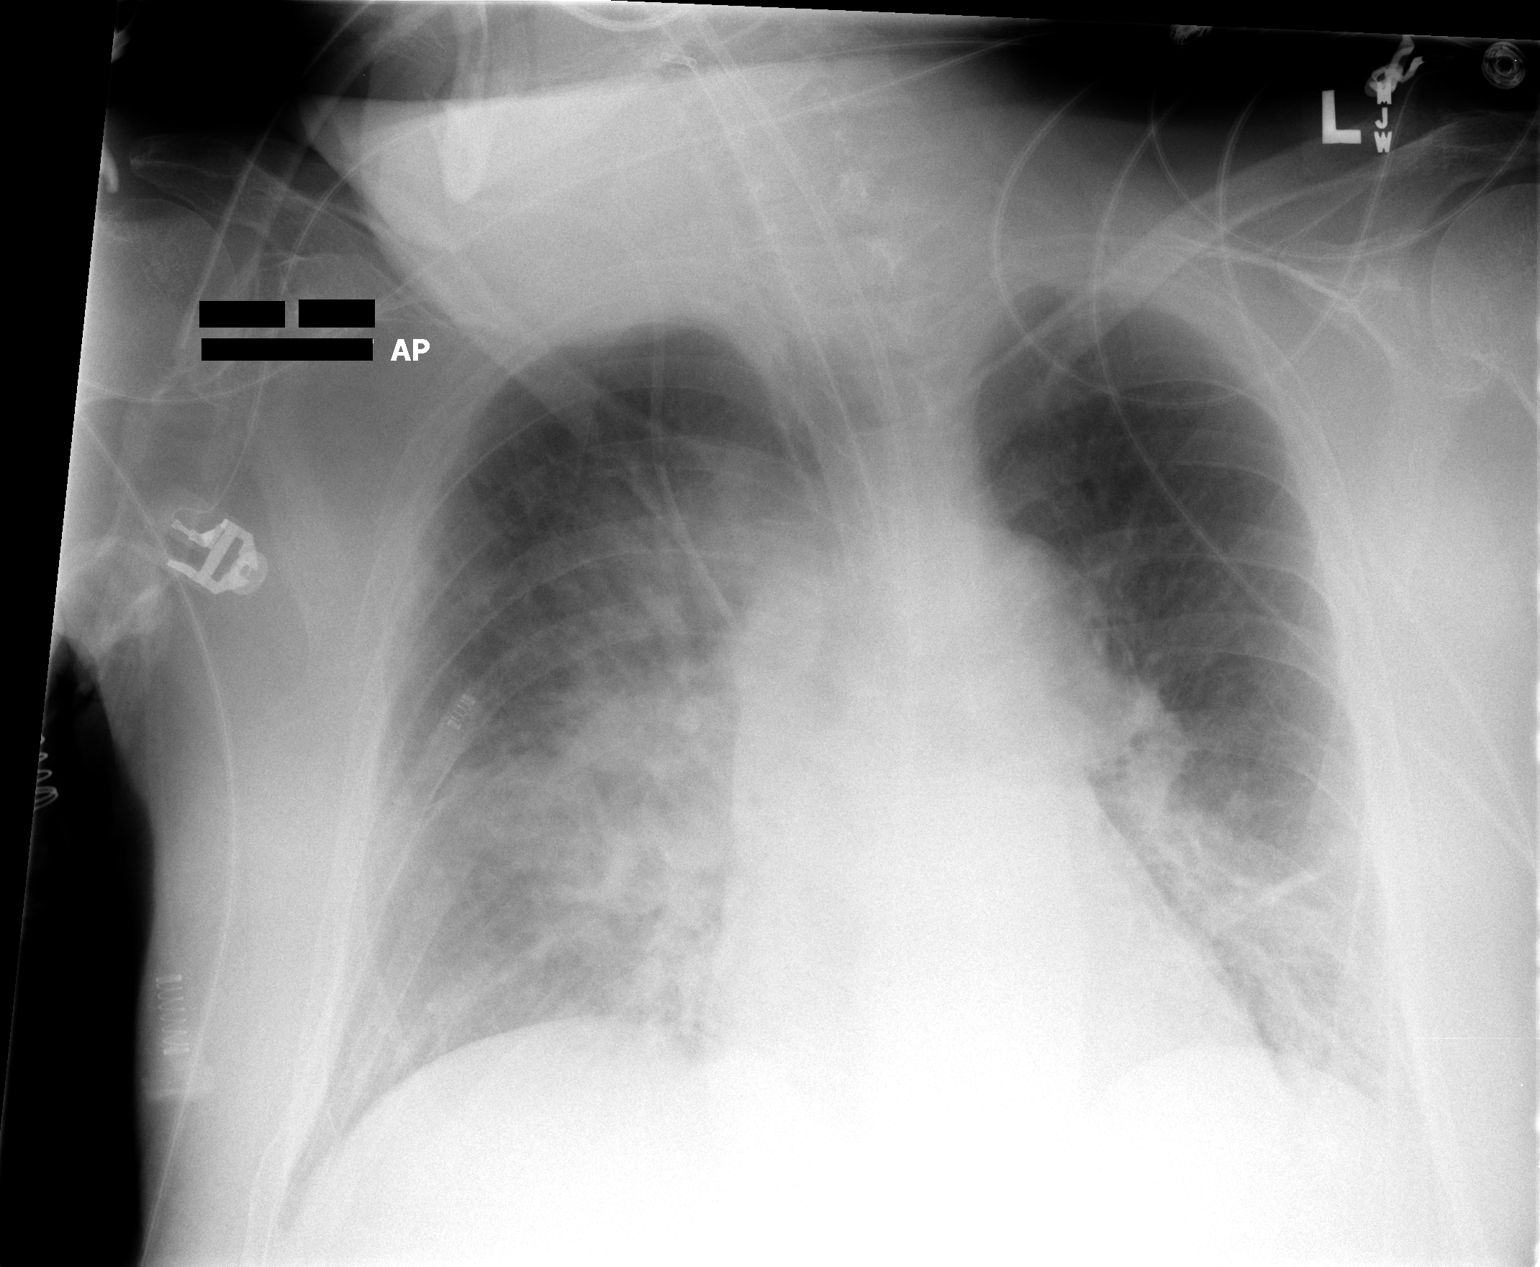

[1 of 1 positions shown; findings below may reference images not displayed]

FINDINGS: Endotracheal tube terminates approximately 2.5 cm above the carina.  Remainder of support apparatus remains in place.  Heart size stable.  Mixed interstitial and airspace disease persists.
IMPRESSION: Pulmonary edema.
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
## 2008-03-06 IMAGING — CR DG CHEST 1V PORT
1 series · 1 of 1 positions shown · non-contrast
Comparison: 04/22/2006

CLINICAL DATA: Ventilator

PORTABLE CHEST - 1 VIEW:

[view not recorded]
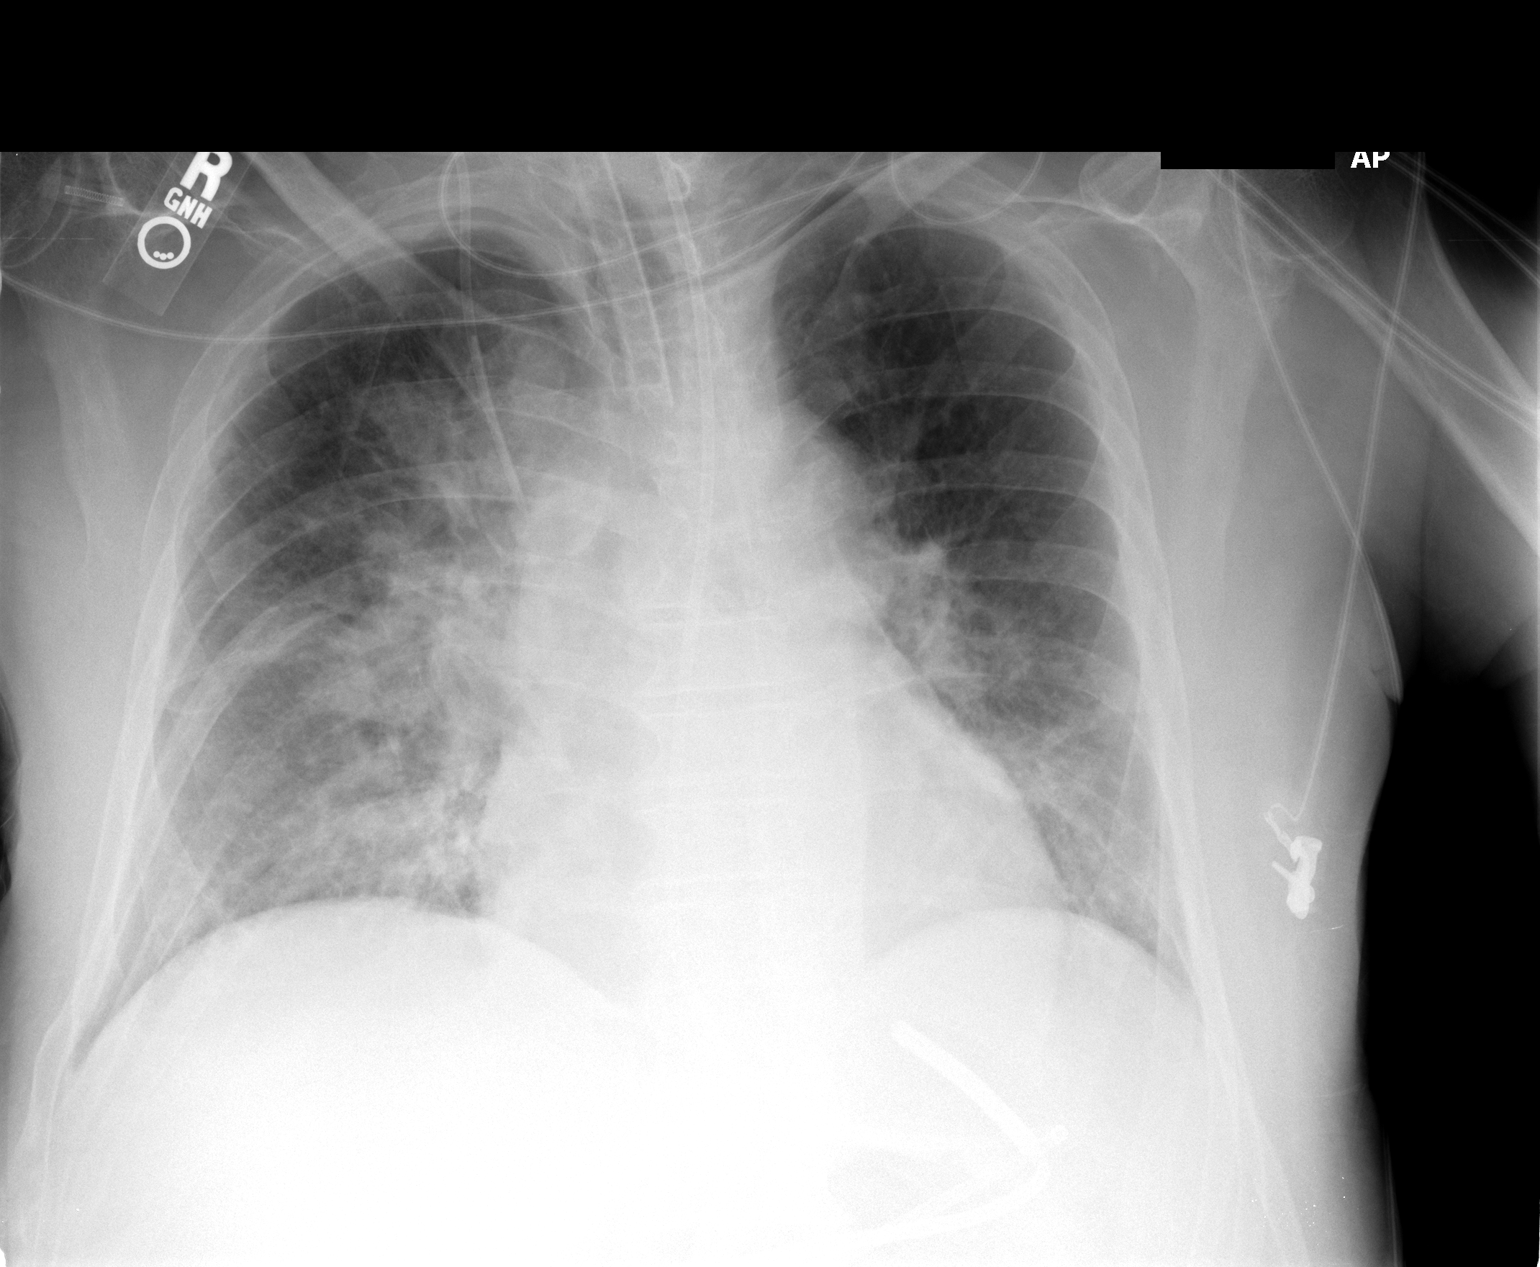

[1 of 1 positions shown; findings below may reference images not displayed]

FINDINGS: Continued bilateral predominantly perihilar air space opacities,
likely edema. Heart is borderline enlarged. No real change since prior study.
Support devices are unchanged.
IMPRESSION: Stable edema pattern.

## 2008-03-07 IMAGING — CR DG ABD PORTABLE 1V
1 series · 1 of 1 positions shown · non-contrast
Comparison: 04/11/06.

CLINICAL DATA: Panda placement.  
 PORTABLE ABDOMEN ? 1 VIEW:

[view not recorded]
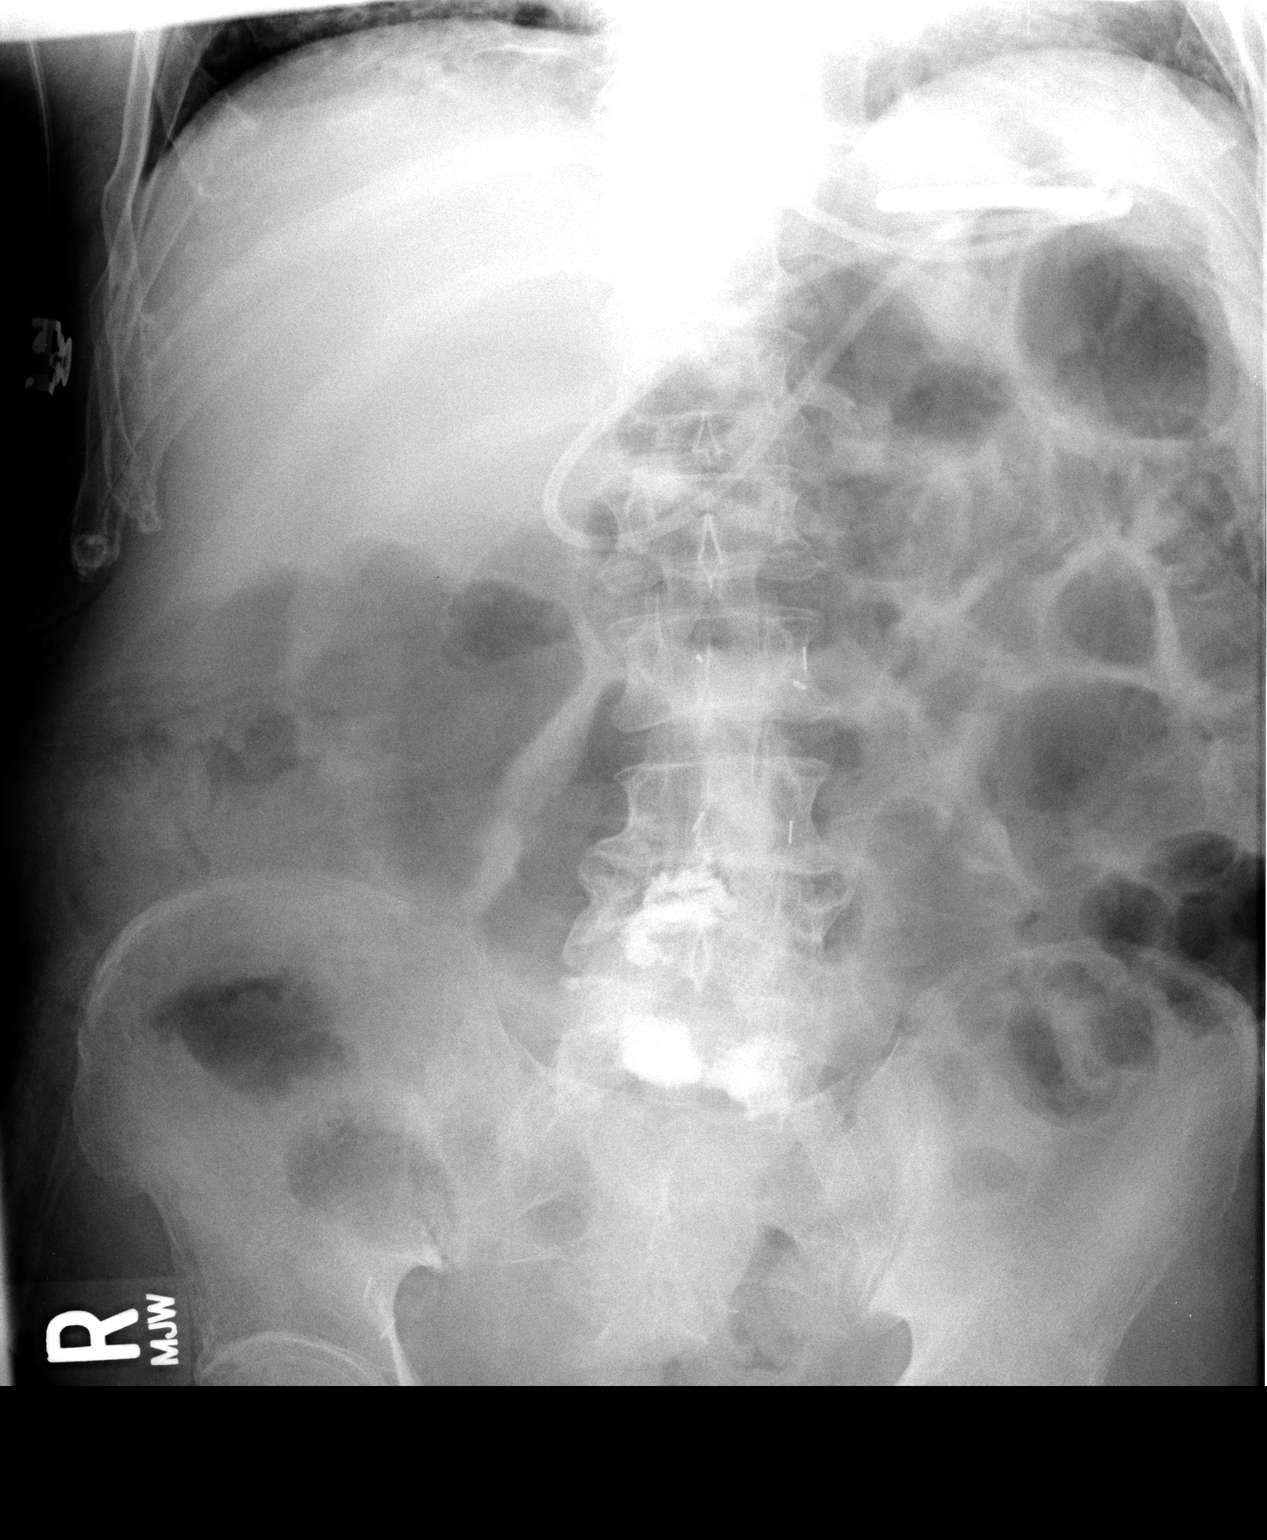

[1 of 1 positions shown; findings below may reference images not displayed]

FINDINGS: Panda tube is coiled in the stomach.
IMPRESSION: As discussed above.

## 2008-03-08 ENCOUNTER — Ambulatory Visit: Payer: Self-pay | Admitting: Gastroenterology
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
## 2008-03-08 IMAGING — CR DG ABD PORTABLE 1V
1 series · 1 of 1 positions shown · non-contrast
Comparison: none

CLINICAL DATA: Hip fracture.  Postop pneumonia and ventilatory dependent respiratory failure.  Feeding tube placement.  
 PORTABLE ABDOMEN ? 1 VIEW:

[view not recorded]
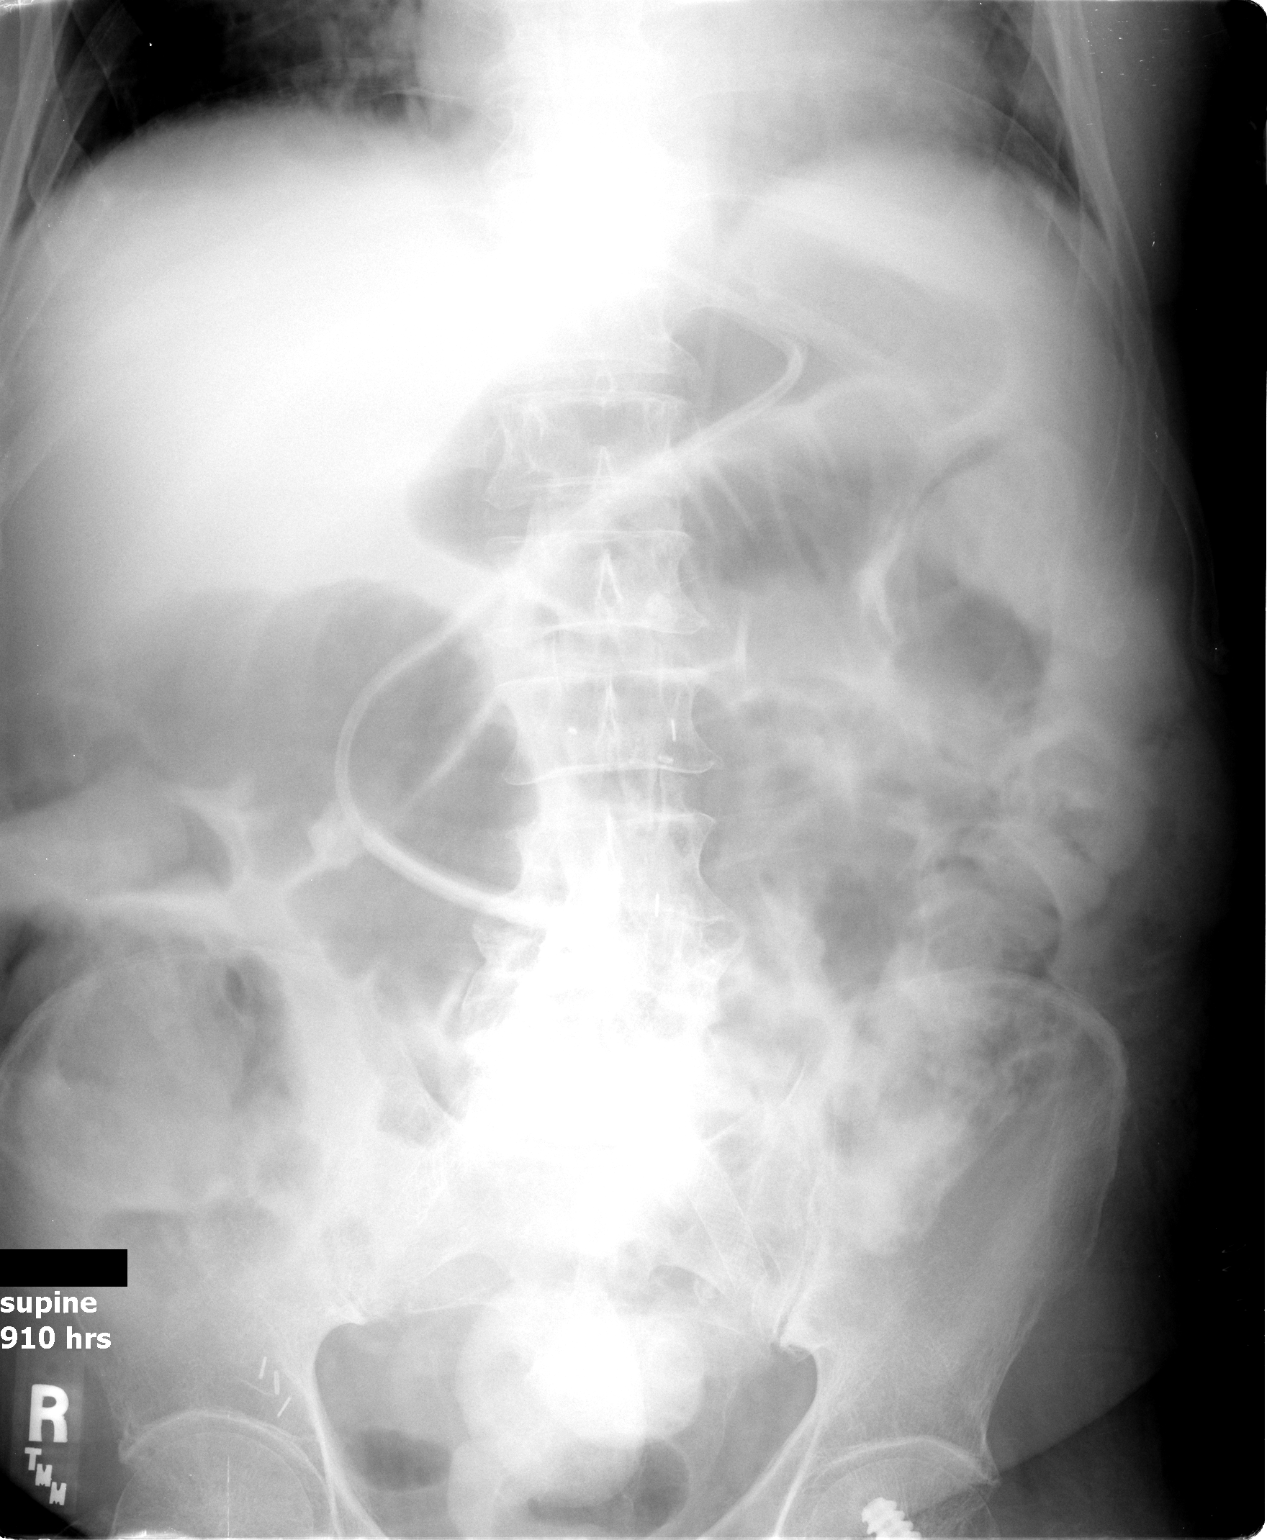

[1 of 1 positions shown; findings below may reference images not displayed]

FINDINGS: A panda feeding tube is seen with the tip in the transverse portion of the duodenum.  Mildly dilated loops of small and large bowel are seen with some contrast in the rectosigmoid colon.  These findings are consistent with an ileus.
IMPRESSION: 1.  Panda feeding tube tip in transverse portion of the duodenum.  
 2.  Ileus pattern.
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
## 2008-03-11 IMAGING — CR DG CHEST 1V PORT
1 series · 1 of 1 positions shown · non-contrast
Comparison: none

CLINICAL DATA: Femur fracture, pneumonia

Portable chest at [DATE]:
Comparison 04/27/2006. Endotracheal tube, feeding tube, right PICC stable in
position. Heart size normal. Bilateral interstitial and airspace edema or
infiltrates with relative peripheral sparing, stable since previous study. No
definite effusion.

[view not recorded]
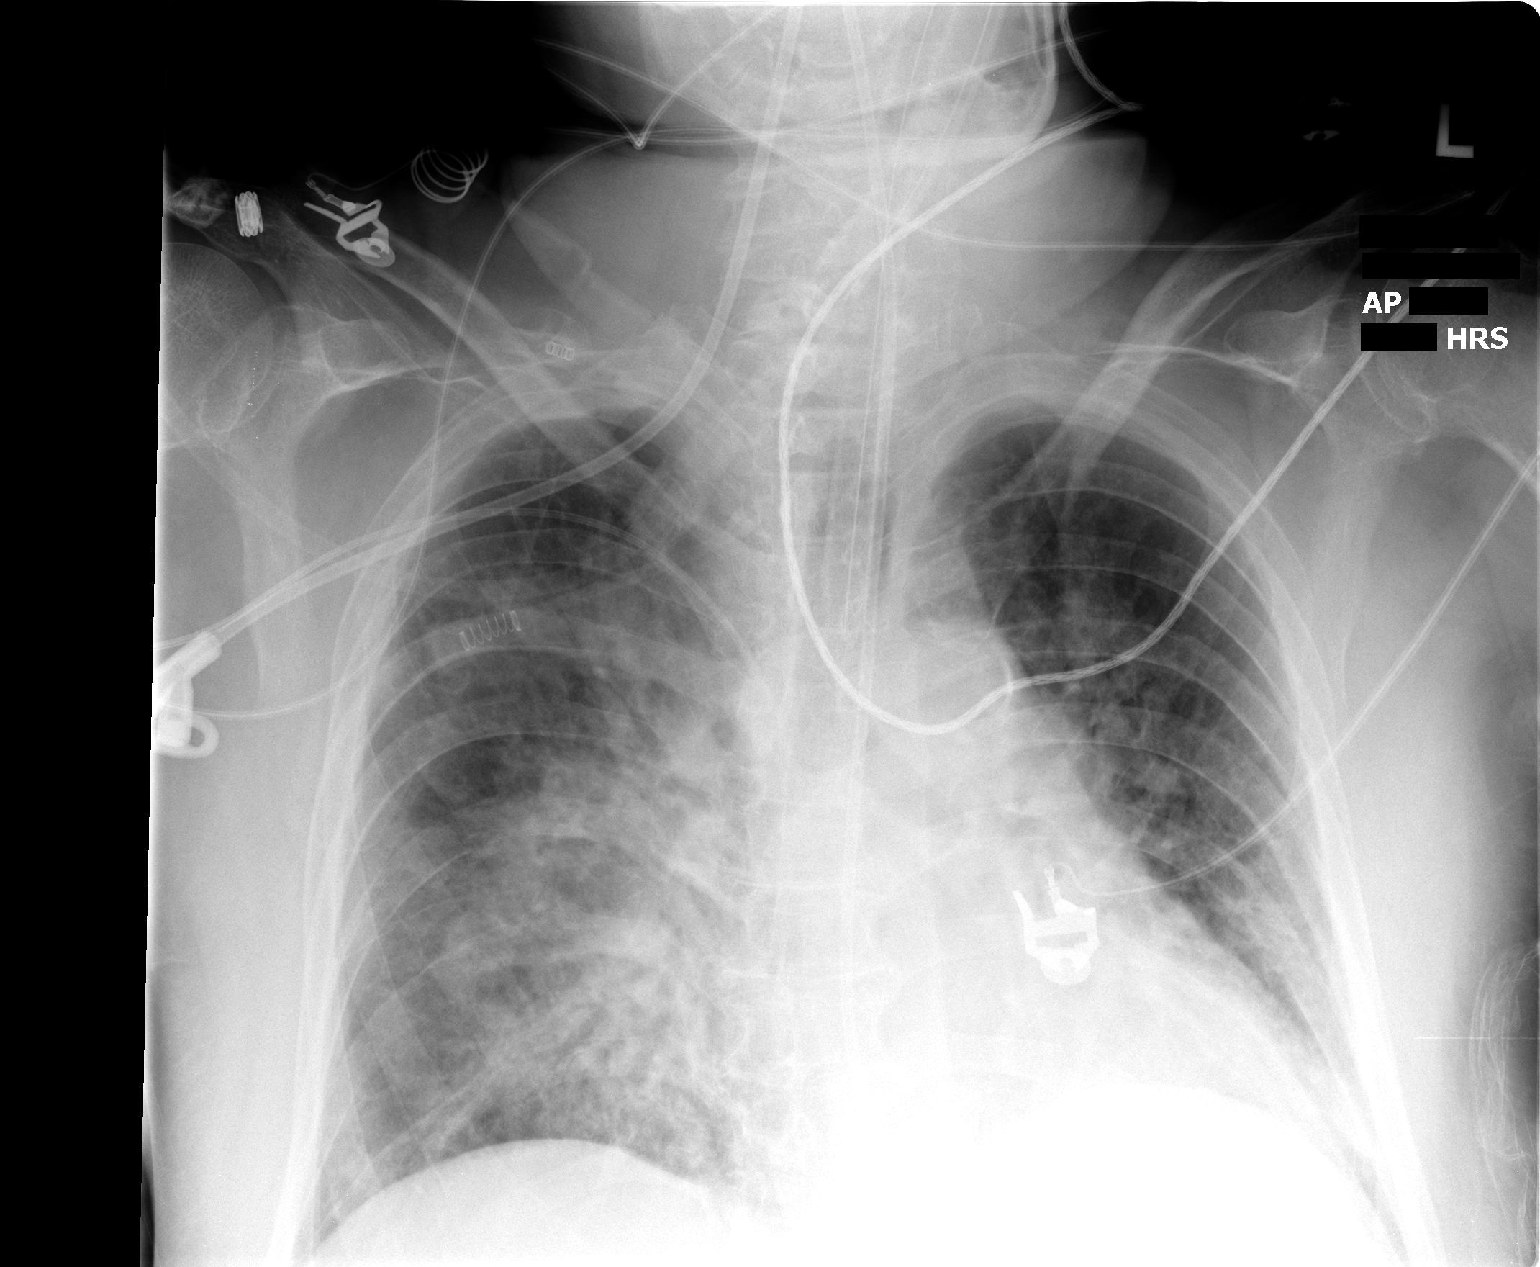

[1 of 1 positions shown; findings below may reference images not displayed]

IMPRESSION: 1. Little change since previous days exam
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
## 2008-03-12 IMAGING — CR DG CHEST 1V PORT
1 series · 1 of 1 positions shown · non-contrast
Comparison: 04/28/06.

CLINICAL DATA: Fractured femur, femoral neck.  Follow up aeration. 
 PORTABLE CHEST ? 1 VIEW ? 04/29/06 AT 6496 HOURS:

[view not recorded]
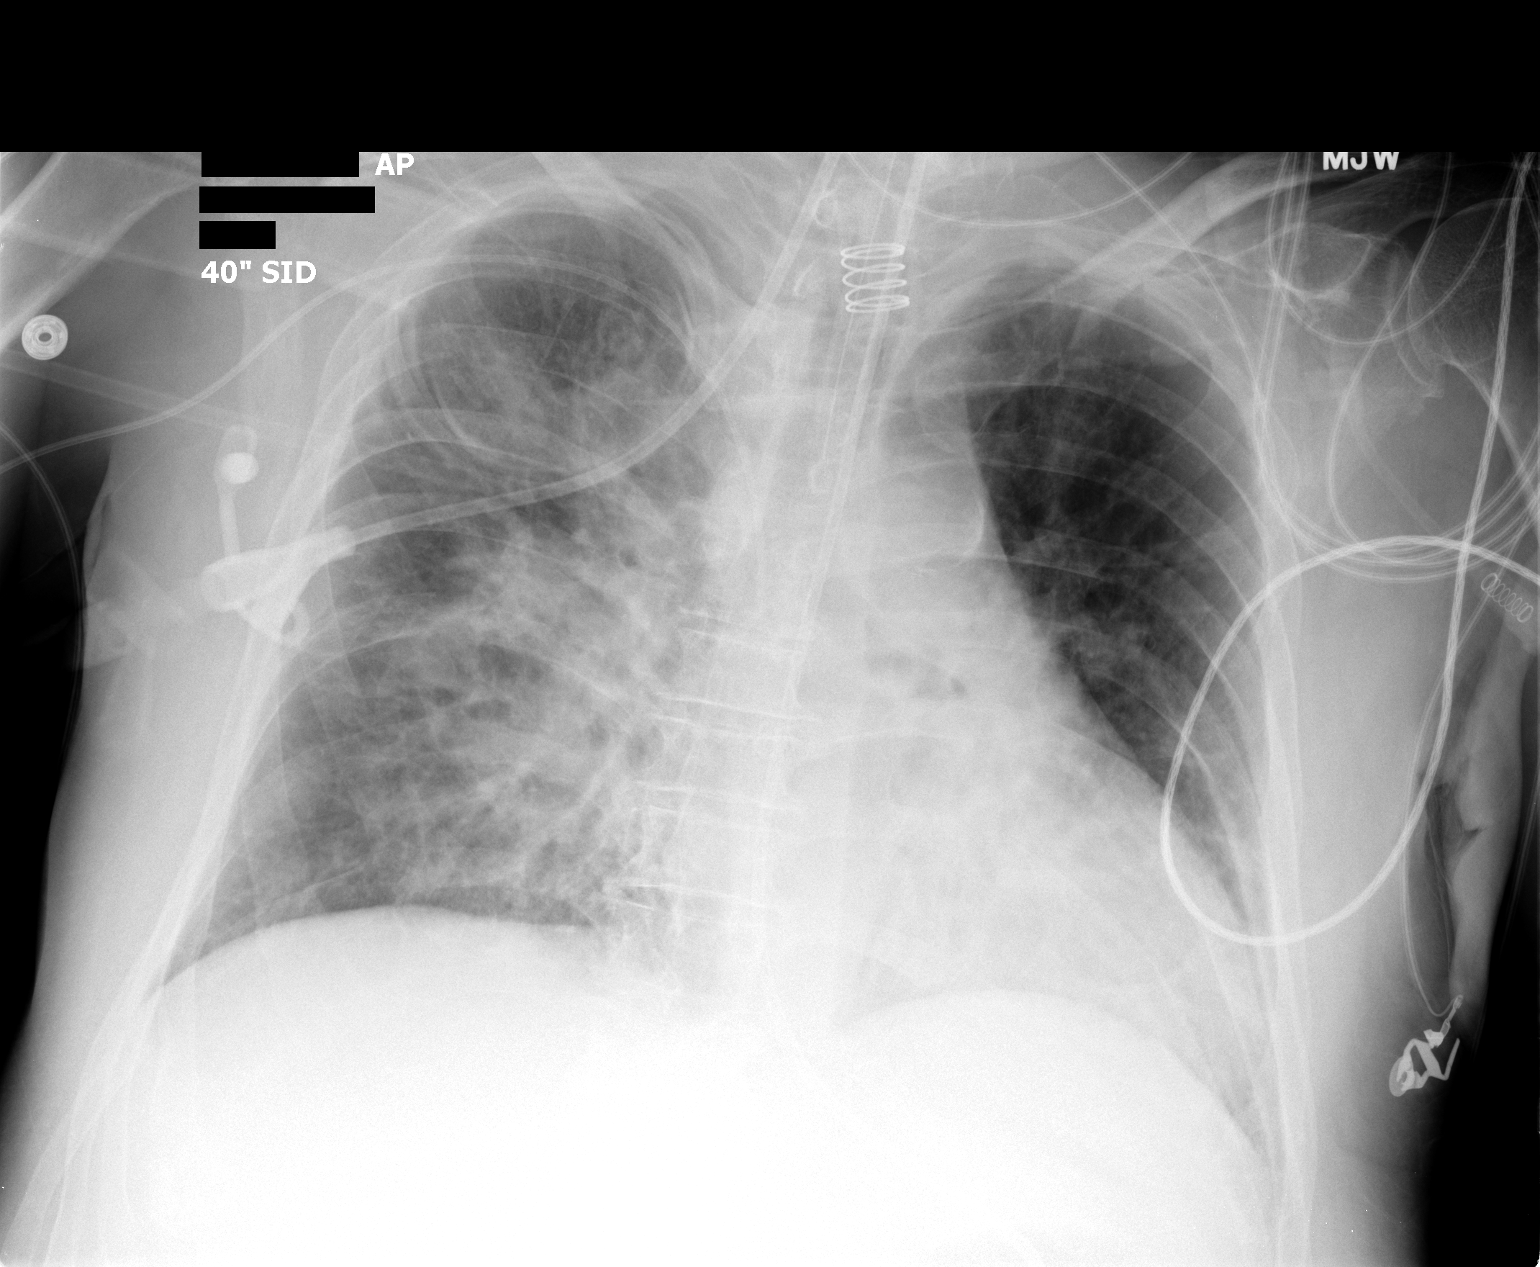

[1 of 1 positions shown; findings below may reference images not displayed]

FINDINGS: The endotracheal tube and central venous catheter appear in satisfactory position.  There is perihilar airspace disease present without significant change in aeration intervally.  There is no pneumothorax.
IMPRESSION: Perihilar airspace disease (right greater than left).  No significant change in aeration.

## 2008-03-12 IMAGING — CR DG HIP (WITH OR WITHOUT PELVIS) 2-3V*L*
2 series · 2 of 2 positions shown · non-contrast
Comparison: 04/14/06.
COMPARISON: 04/14/06.

CLINICAL DATA: Trauma.
 LEFT WRIST ? 2 VIEW:

[view not recorded (1 of 2)]
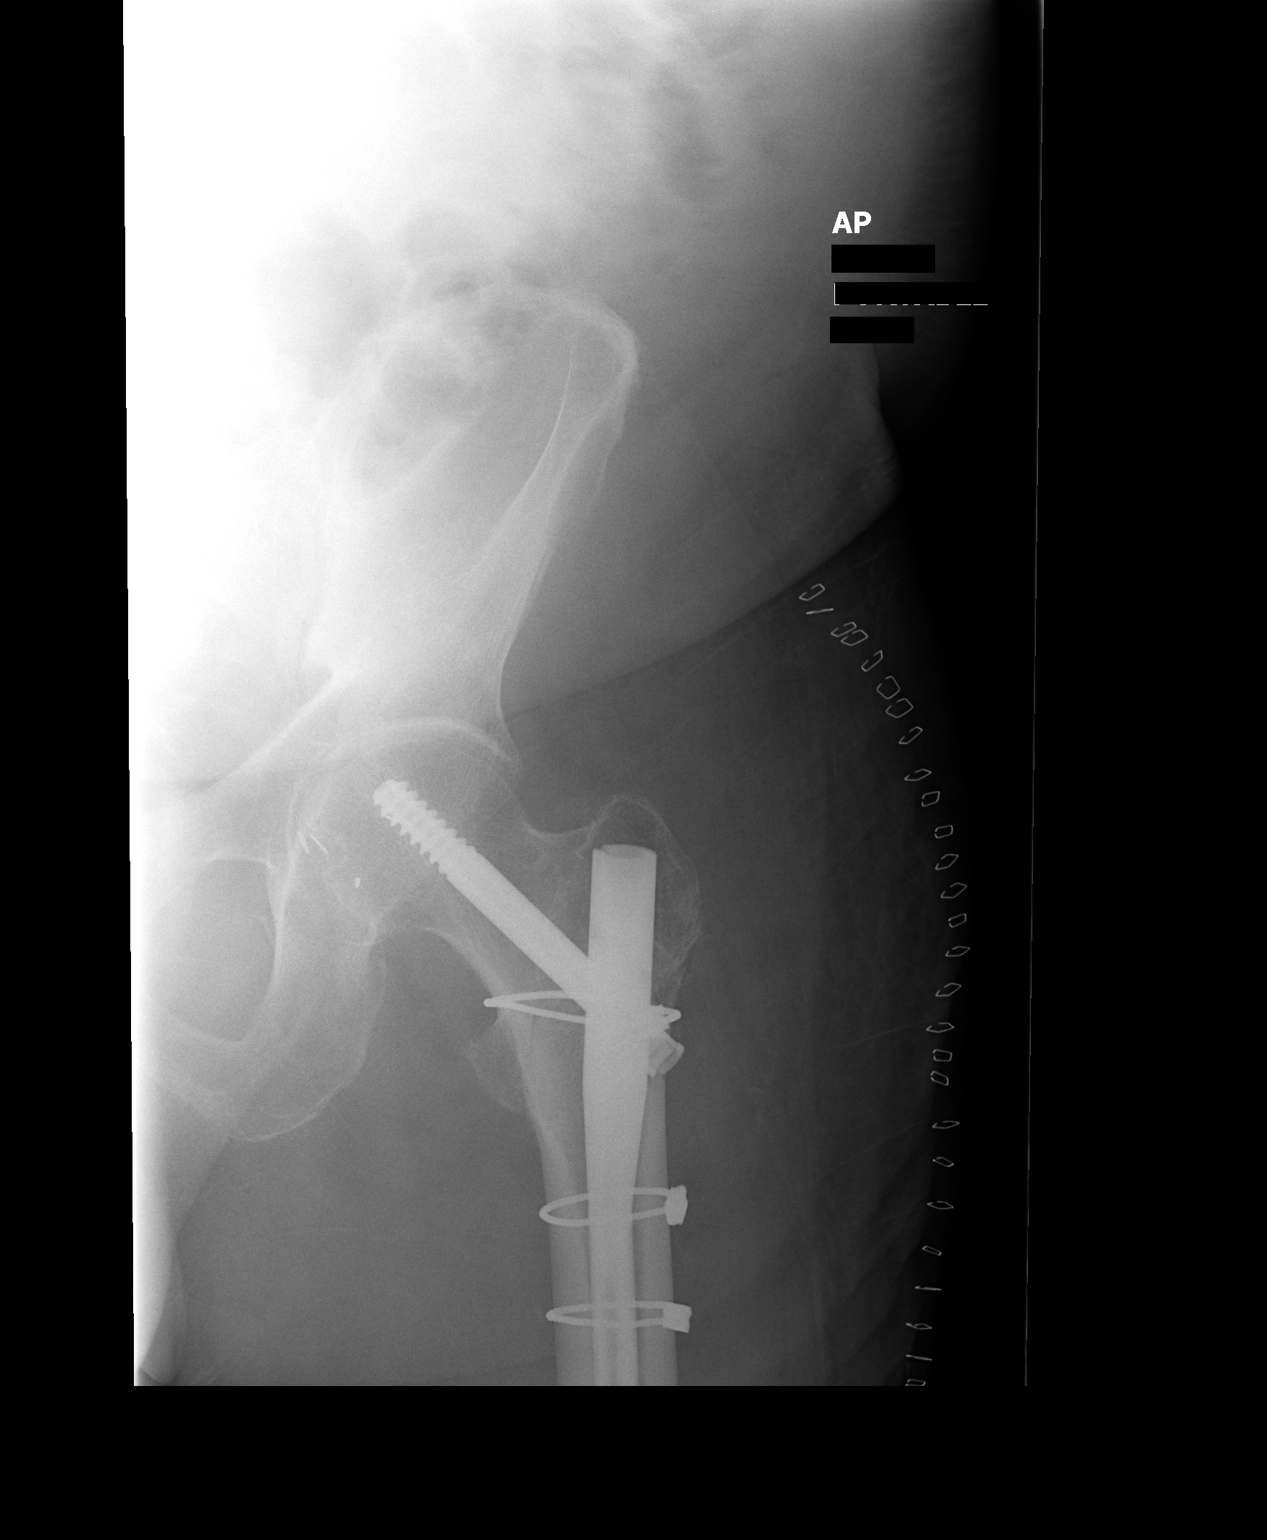

[view not recorded (2 of 2)]
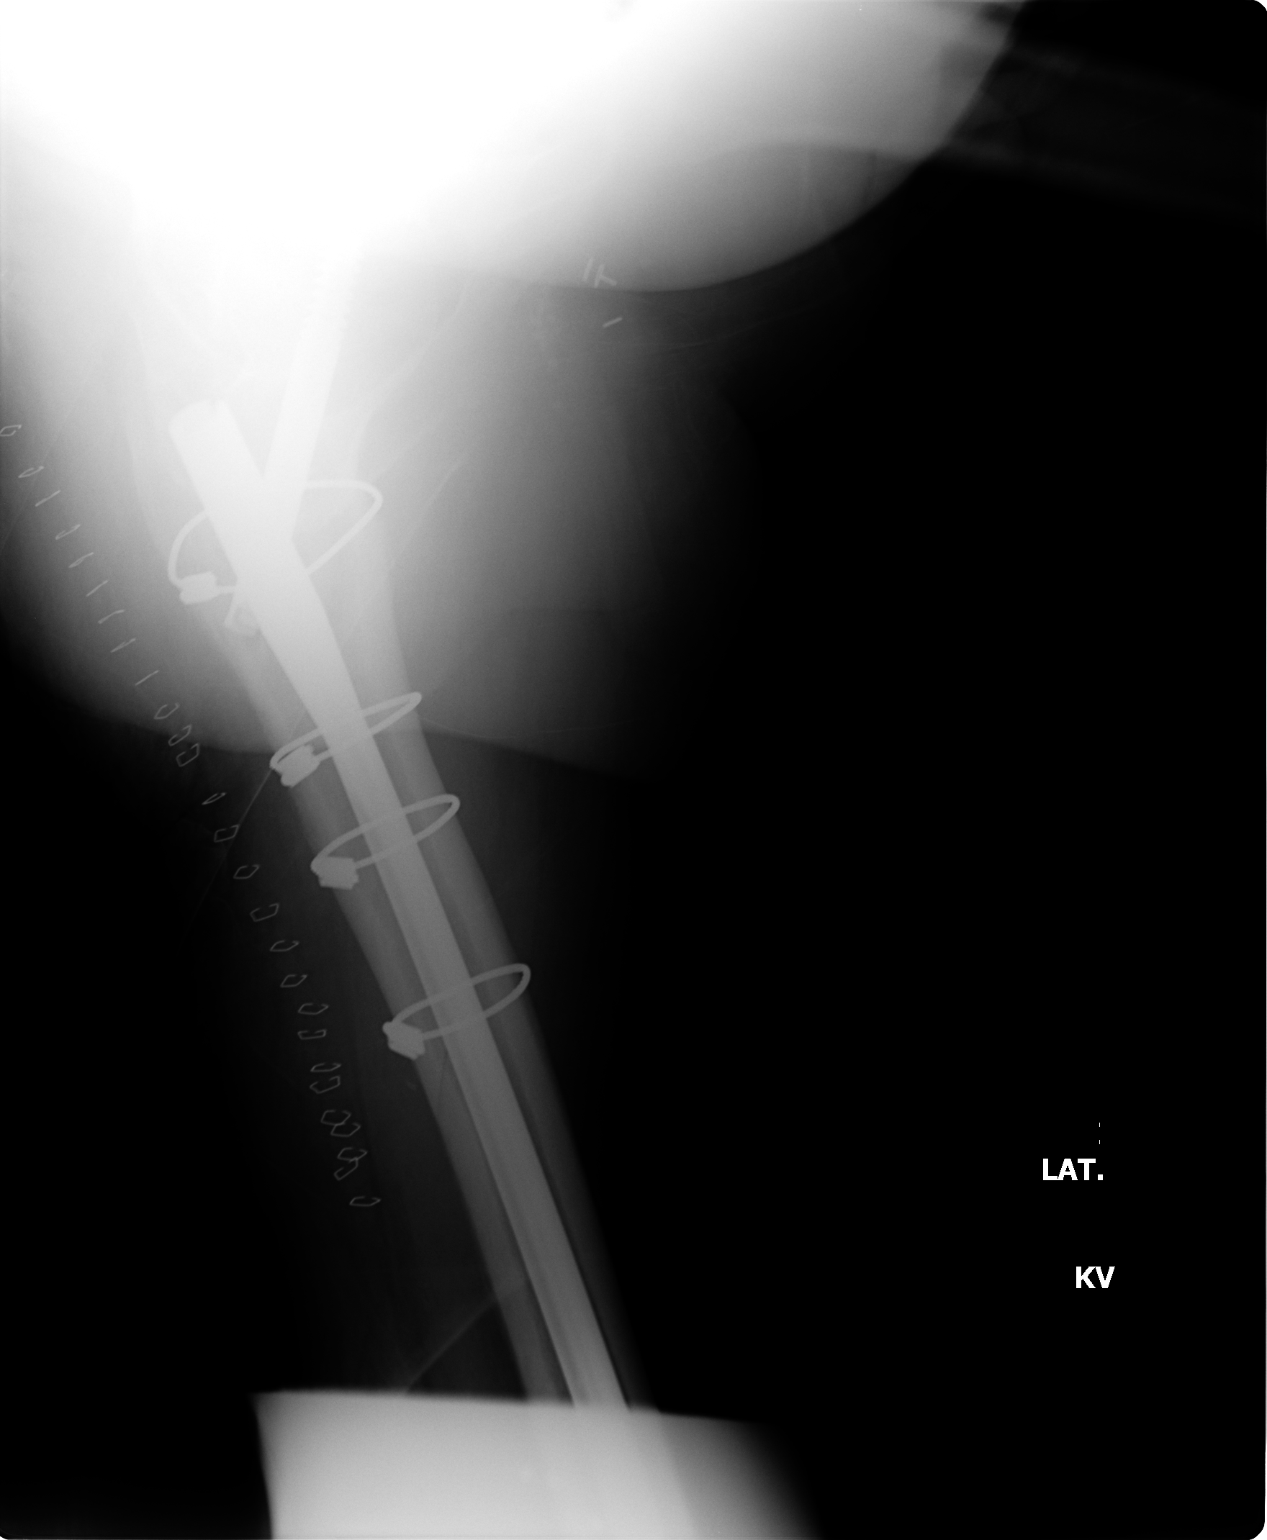

[2 of 2 positions shown; findings below may reference images not displayed]

FINDINGS: No change in position or alignment of a comminuted slightly displaced fracture of the distal ulnar diaphysis.
IMPRESSION: See above report.
 LEFT HIP/FEMUR ? 2 VIEW
FINDINGS: The patient has had an intramedullary nail placed with a compression screw and cerclage wires.   Components appear well positioned, and there is no radiographic or technical complication.  The distal aspect of the nail is not included Boley the film.
IMPRESSION: See above report.
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
## 2008-03-13 IMAGING — CR DG ABD PORTABLE 1V
1 series · 1 of 1 positions shown · non-contrast
Comparison: none

CLINICAL DATA: Panda placement. 
 PORTABLE ABDOMEN ? 1 VIEW:

[view not recorded]
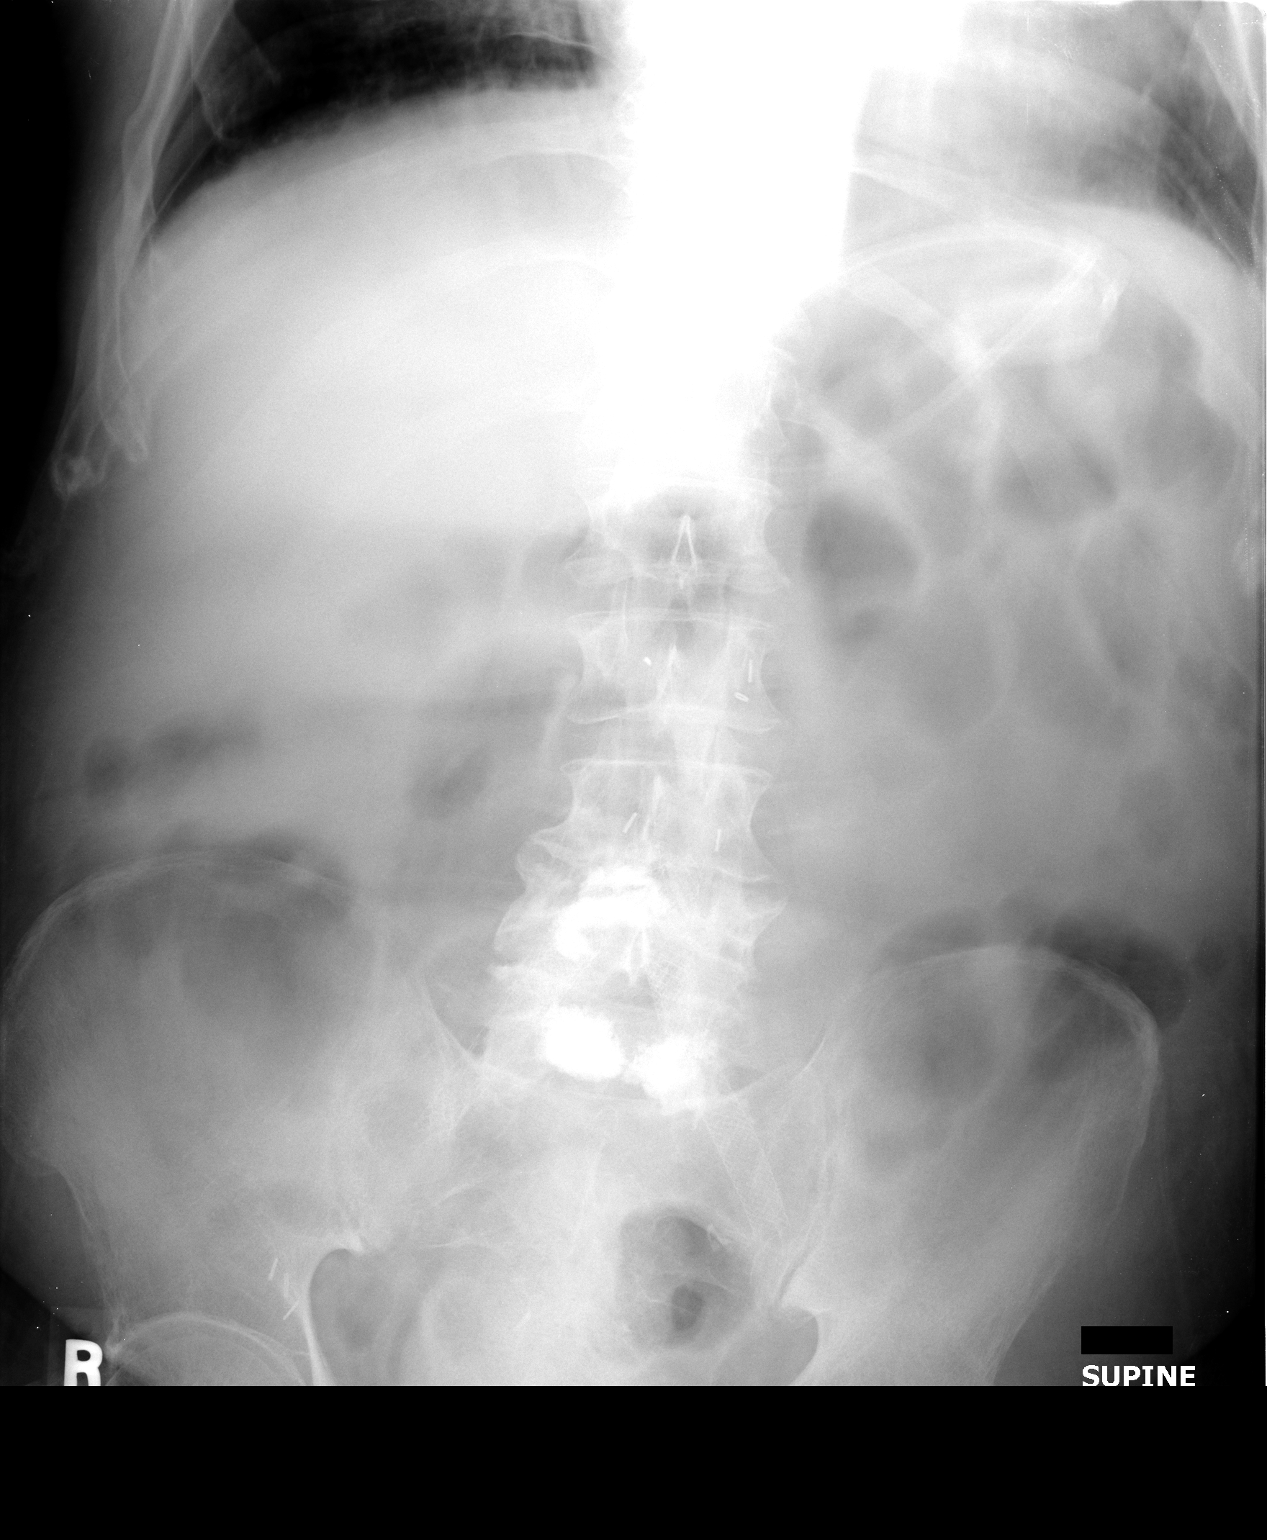

[1 of 1 positions shown; findings below may reference images not displayed]

FINDINGS: A panda feeding tube is coiled in the proximal stomach.
IMPRESSION: Panda tube is in the proximal stomach.

## 2008-03-14 IMAGING — CR DG ABD PORTABLE 1V
1 series · 1 of 1 positions shown · non-contrast
Comparison: none

CLINICAL DATA: Feeding tube placement.  
 ABDOMEN ? 1 VIEW:

[view not recorded]
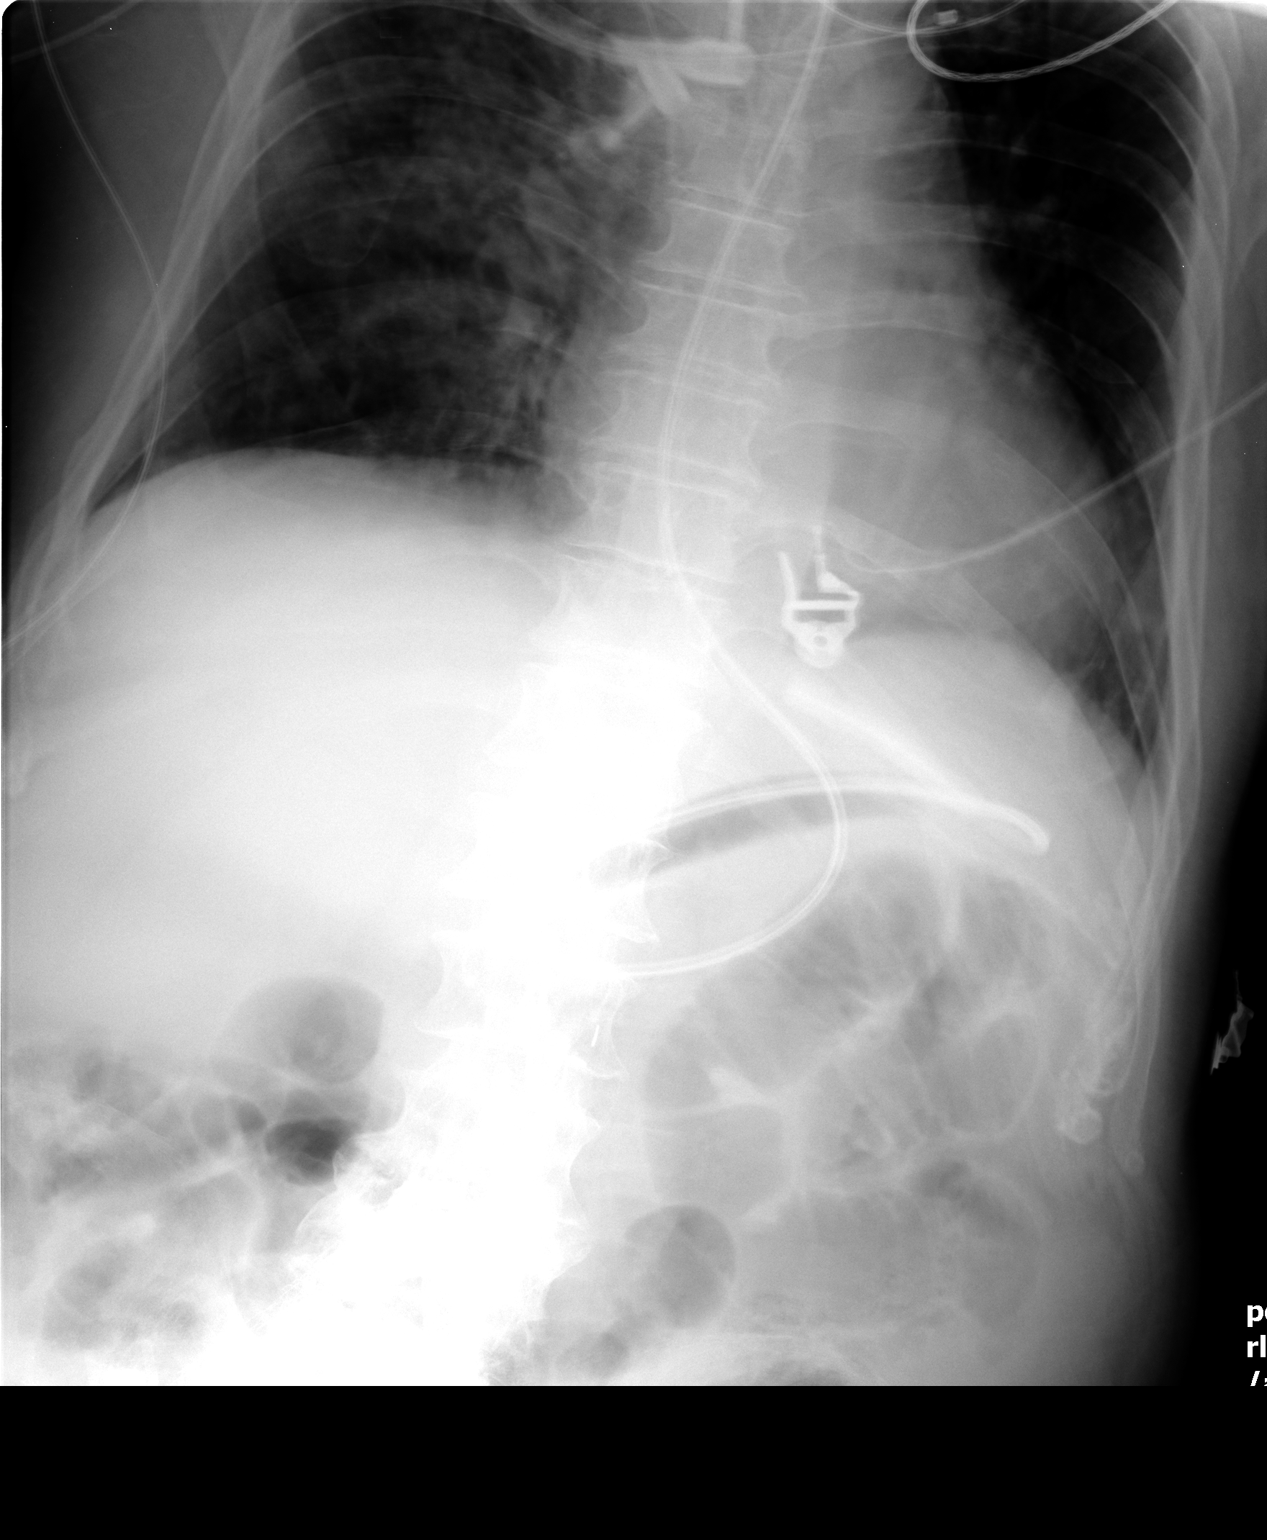

[1 of 1 positions shown; findings below may reference images not displayed]

FINDINGS: Feeding tube is coiled in the stomach with the tip in the region of the gastroesophageal junction.
IMPRESSION: Please see above.
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
## 2008-03-19 IMAGING — CR DG WRIST 2V*L*
2 series · 2 of 2 positions shown · non-contrast
Comparison: 04/29/06.

CLINICAL DATA: Recheck multiple fractures.
LEFT WRIST ? 3 VIEWS:

[view not recorded (1 of 2)]
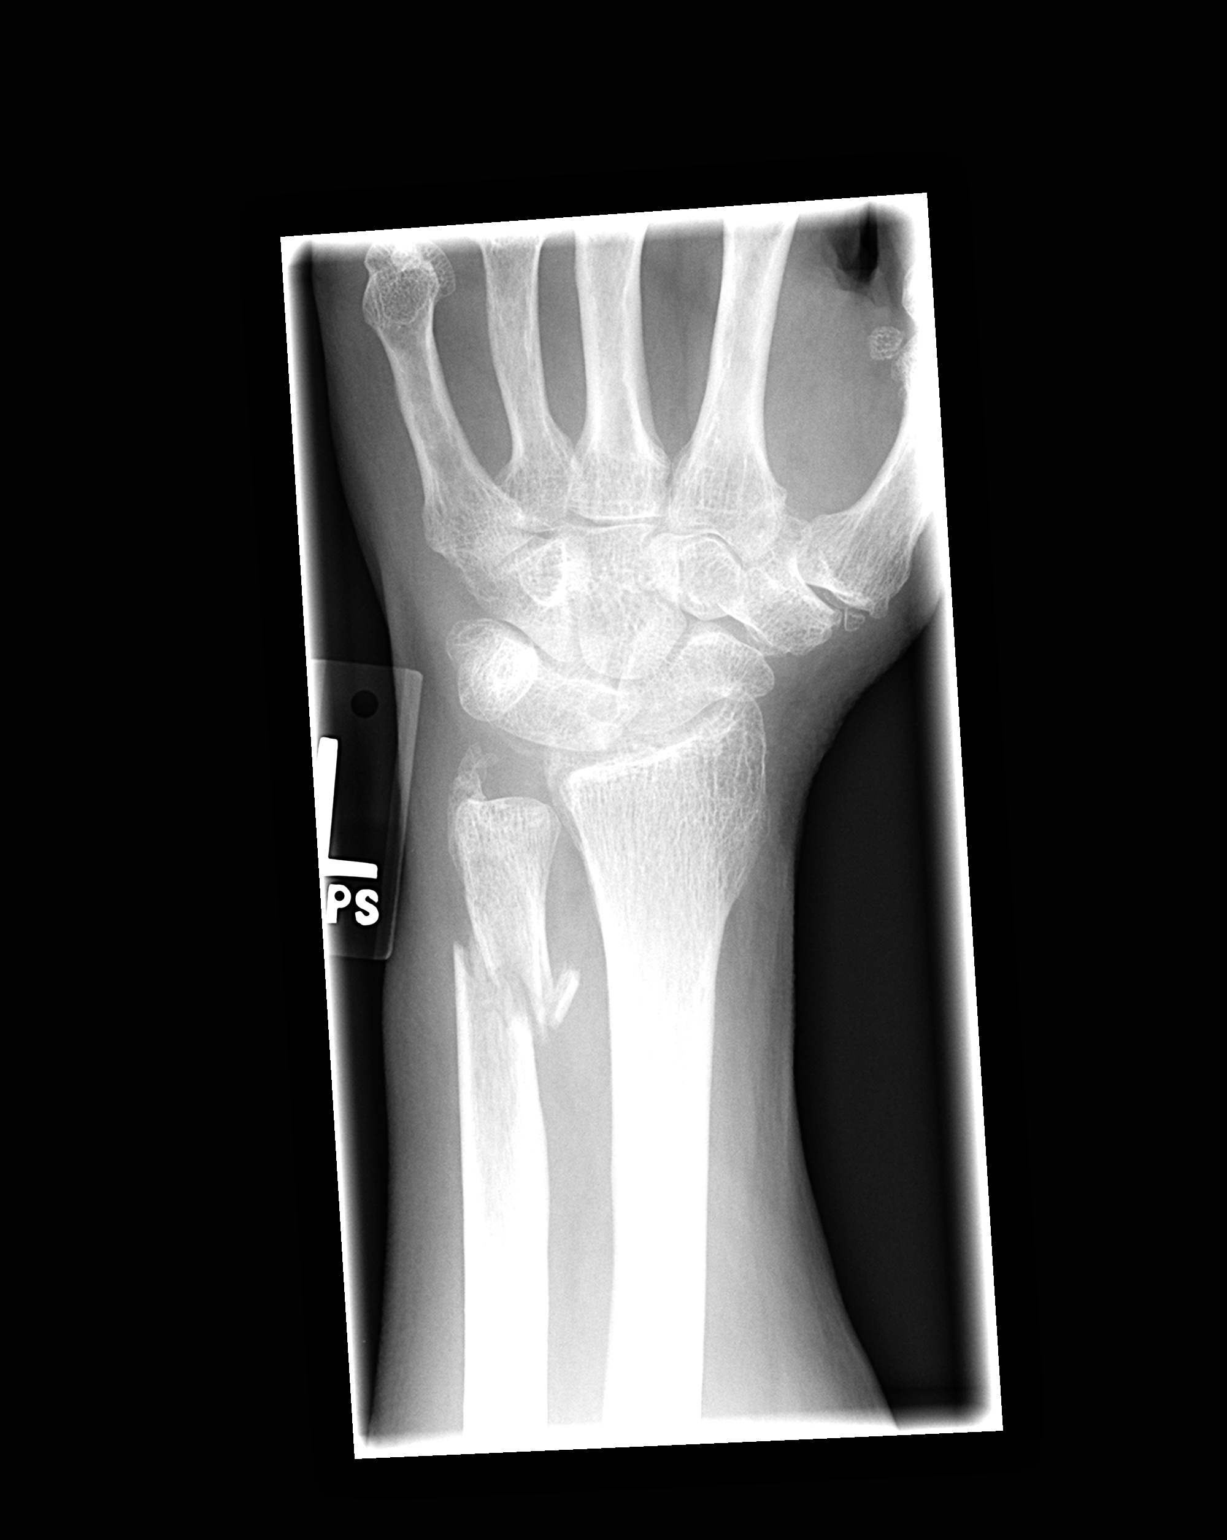

[view not recorded (2 of 2)]
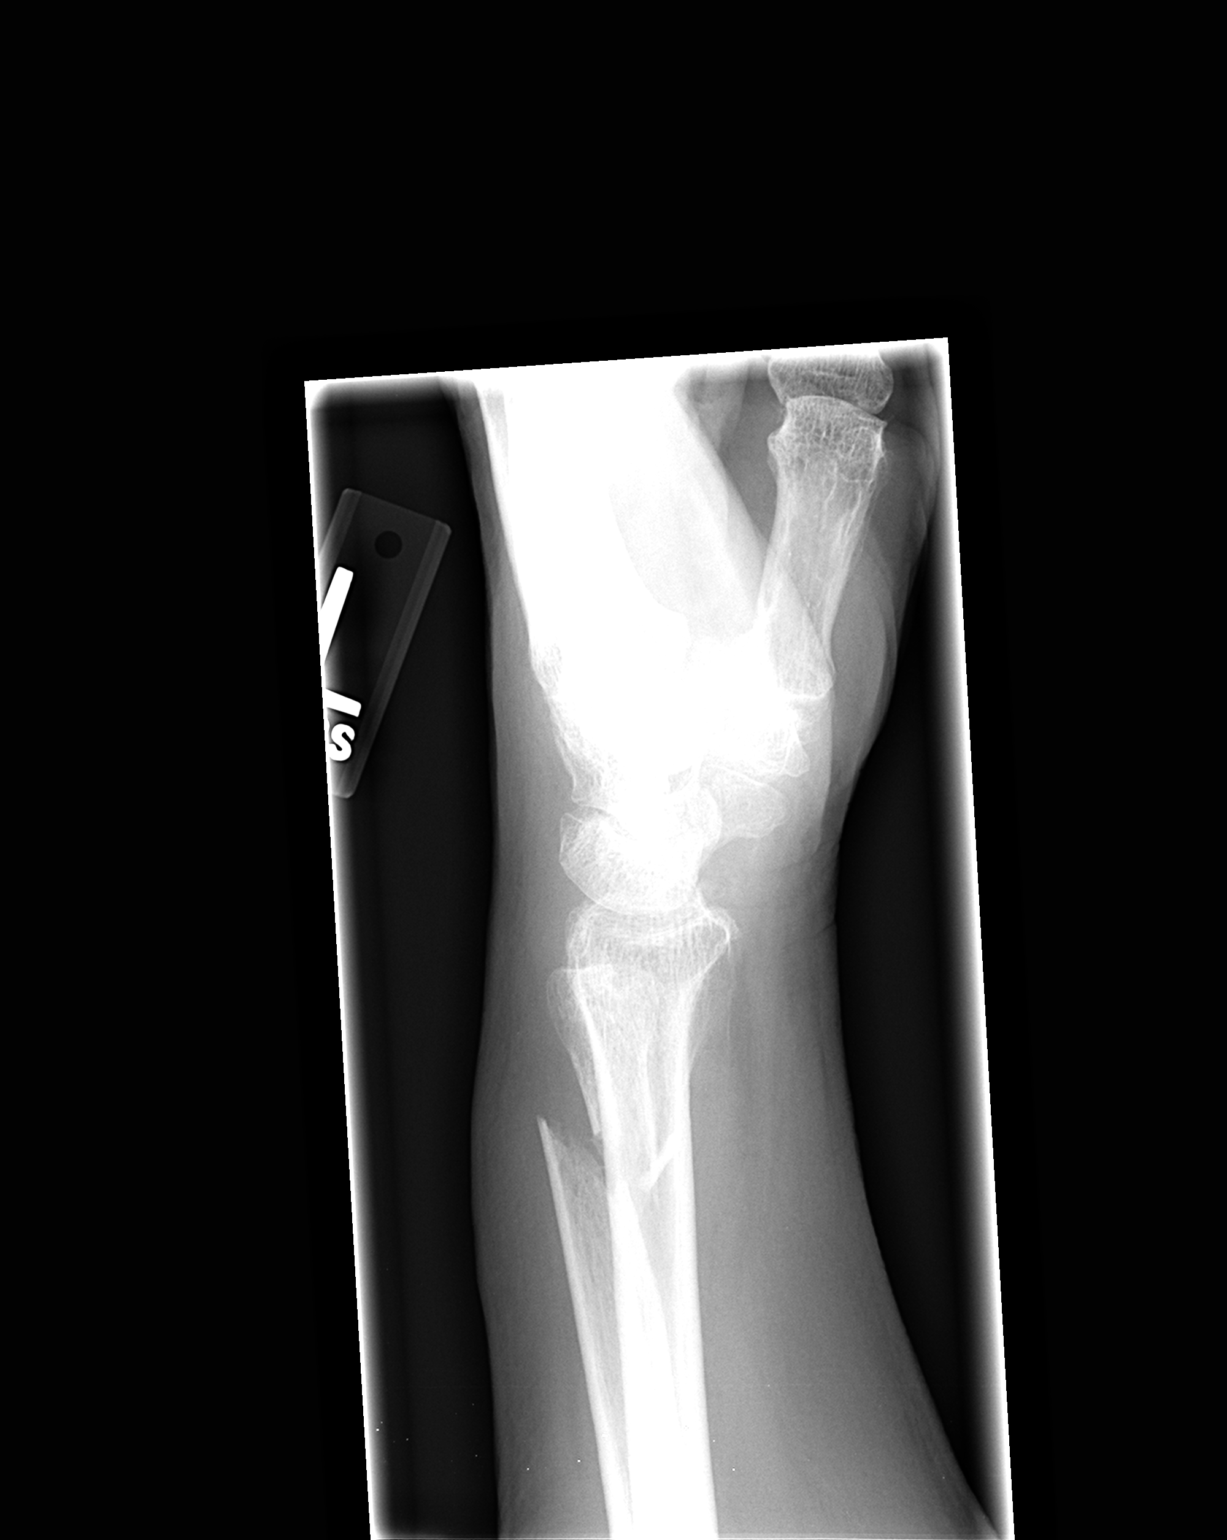

[2 of 2 positions shown; findings below may reference images not displayed]

FINDINGS: isplaced fracture of the distal left ulnar shaft again appreciated.  There is some foreshortening.  The distal fracture fragment is displaced laterally by approximately one half shaft diameter.  It is displaced anteriorly by nearly one shaft diameter.  There is no significant callus formation. Small oval bony densities adjacent to the ulnar styloid process appear corticated suggesting accessory ossicles vs. fracture fragments.
IMPRESSION: No significant change in position or alignment of displaced distal ulnar fracture fragments.
LEFT FOREARM ? 2 VIEW:
FINDINGS: A displaced fracture of the distal left ulna is again noted.  There is some osteopenia involving the distal ulnar shaft.  There appears to be a fracture of the ulnar styloid process which is not well appreciated on the views of the wrist.
IMPRESSION: Displaced distal left ulna fracture including involvement of the shaft and styloid process.

## 2008-03-19 IMAGING — CR DG KNEE 1-2V*R*
1 series · 1 of 1 positions shown · non-contrast
Comparison: none

CLINICAL DATA: Right femoral fracture ? recheck.
 RIGHT KNEE ? 2 VIEWS:
 Comparison is made to 05/06/06.

[t knee ap right]
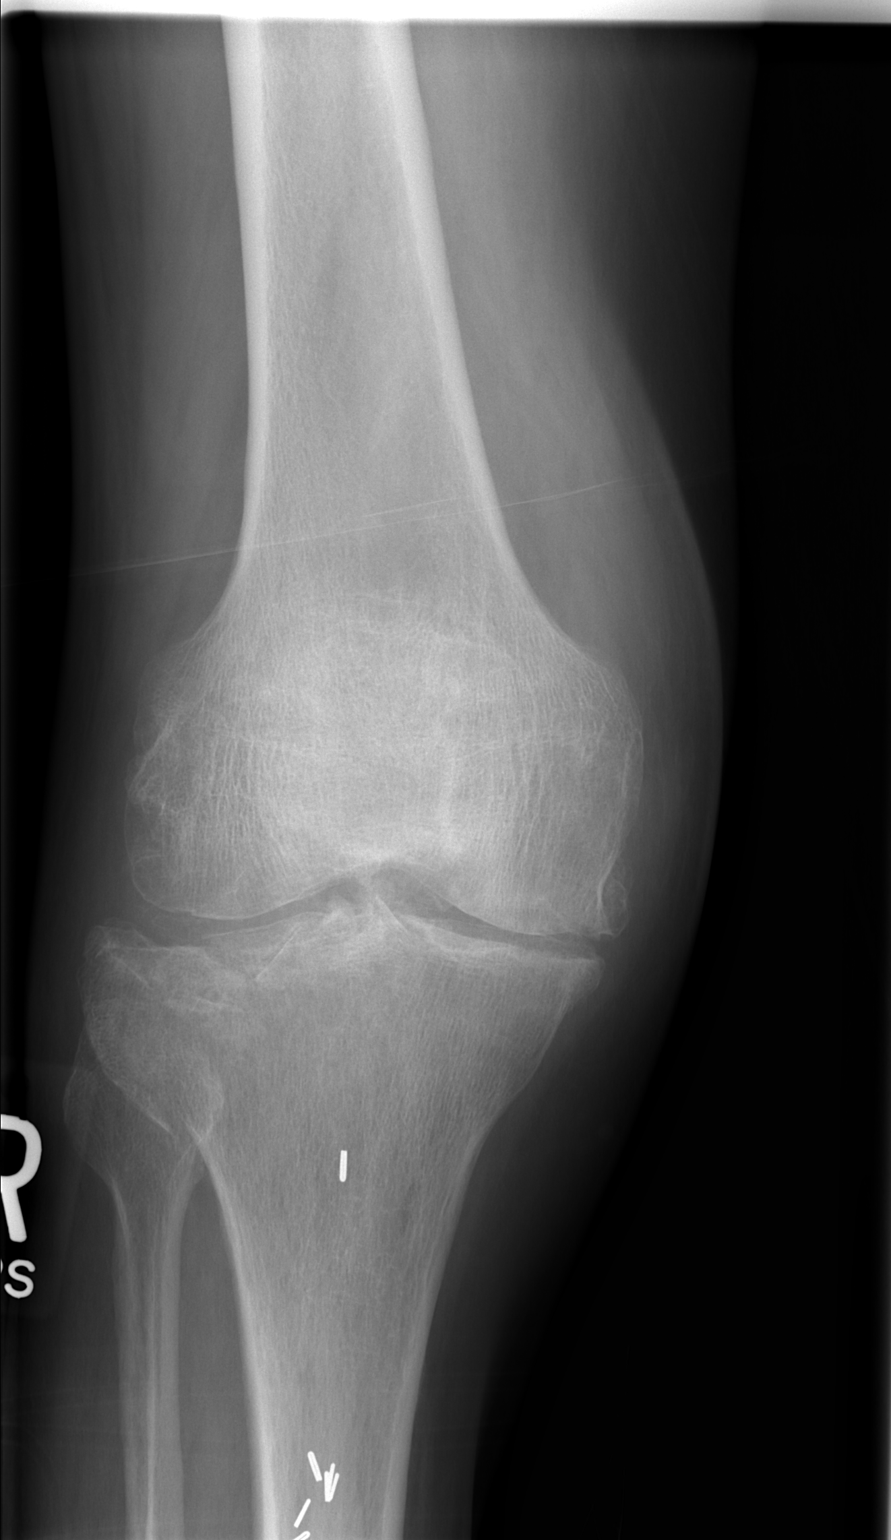

[1 of 1 positions shown; findings below may reference images not displayed]

FINDINGS: The comminuted lateral tibial plateau fracture with depression is again appreciated.  Degenerative osteoarthritic changes are again noted.  Knee joint effusion.
IMPRESSION: No interval change in the appearance of the lateral tibial plateau fracture with displacement and depression.

## 2008-03-19 IMAGING — CR DG HIP (WITH OR WITHOUT PELVIS) 2-3V*L*
2 series · 2 of 2 positions shown · non-contrast
Comparison: none

Addendum BeginsOriginal report by Dr. Chachi.  Following addendum by Dr. Chachi on 05/06/06: 
 Bilateral iliac stents are noted.  

 Addendum Ends
CLINICAL DATA: Femoral neck fracture.  
LEFT HIP 3 VIEW:

[t pelvis a.p.]
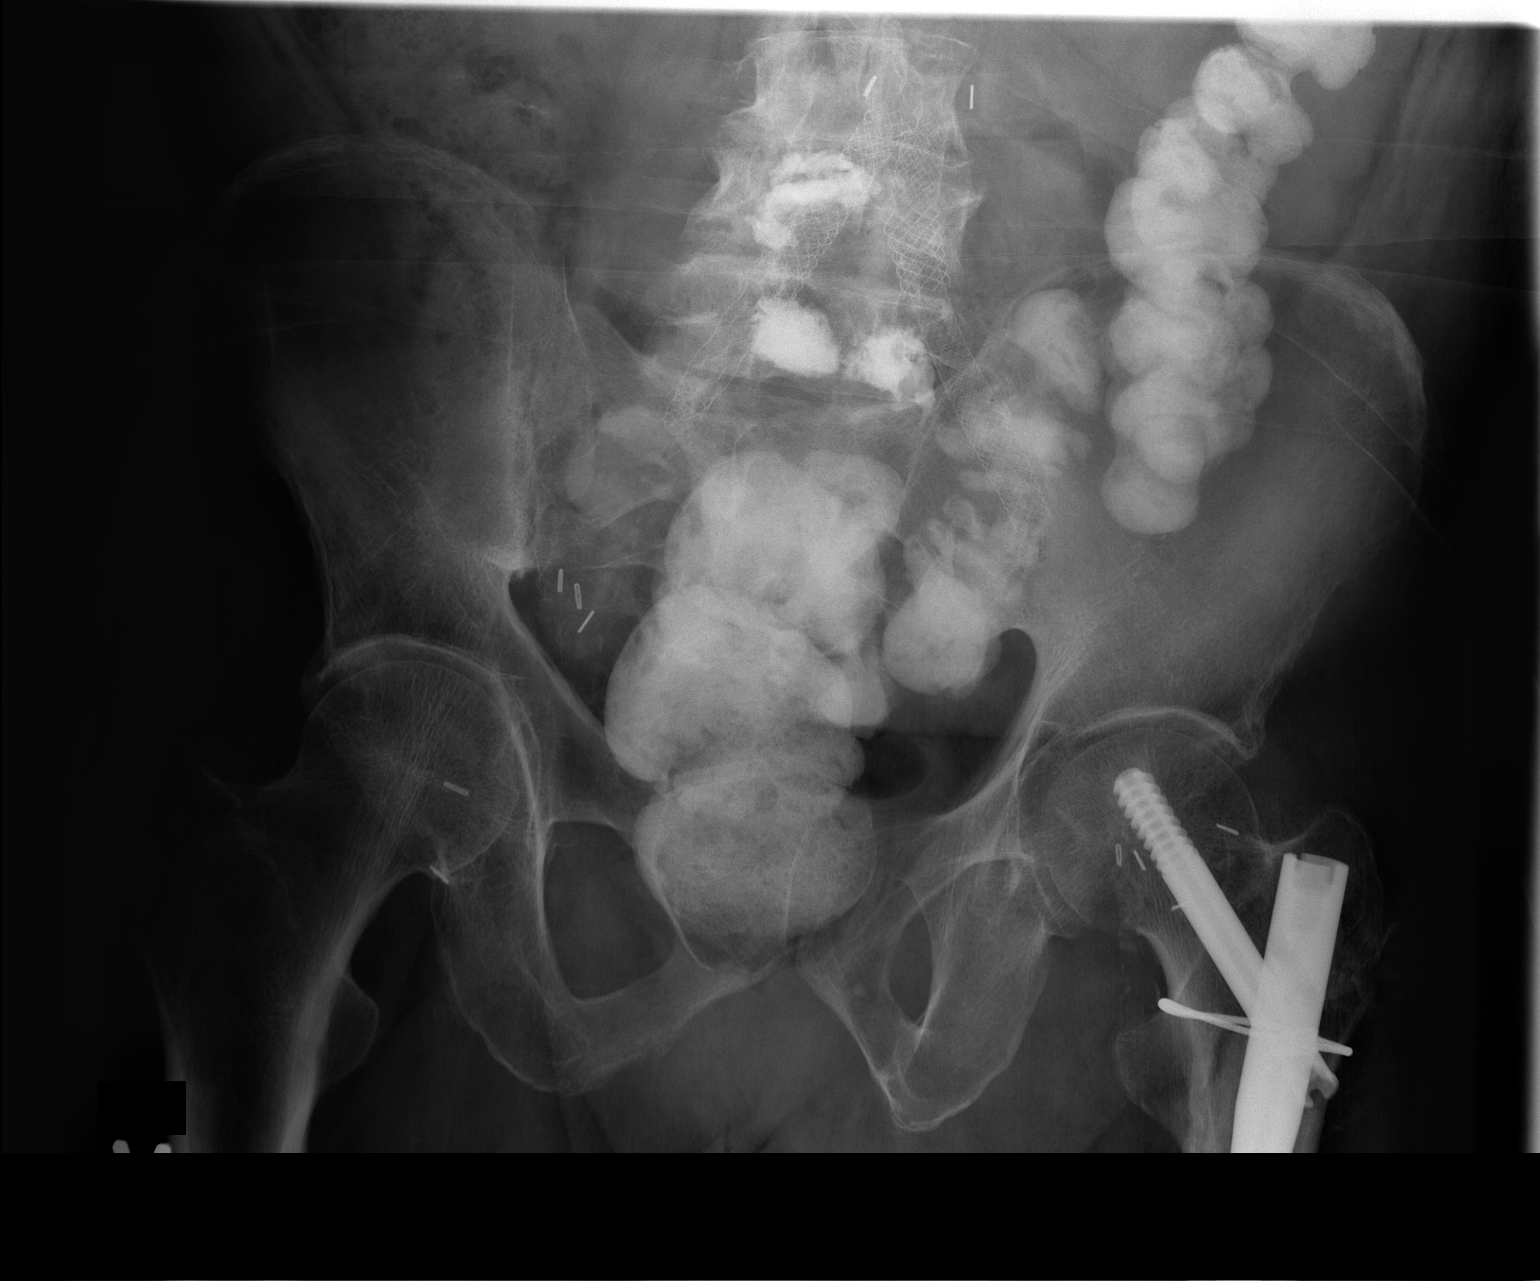

[t hip ap left]
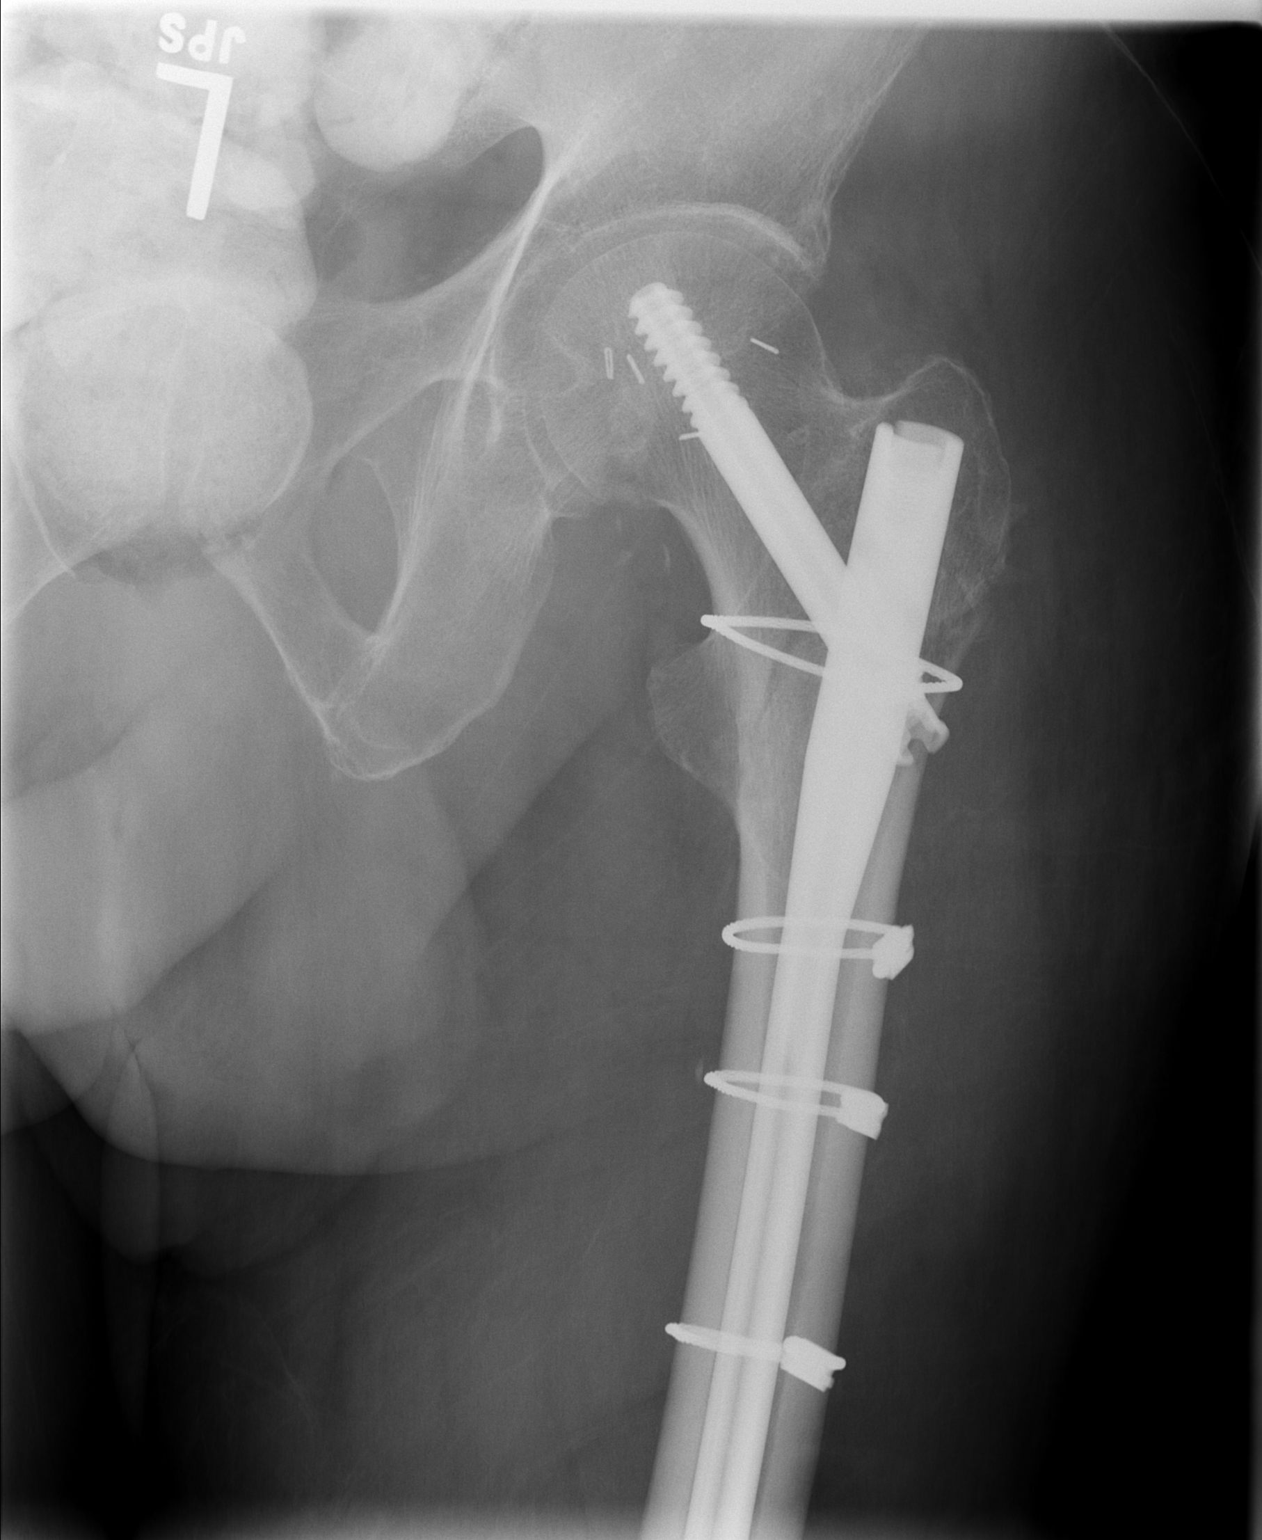

[2 of 2 positions shown; findings below may reference images not displayed]

FINDINGS: Intertrochanteric fracture reduced by dynamic screw and intramedullary nail.  Proximal aspect of the nail is visualized.  Distal aspect of the nail is not included in the field of view.  Left femoral cerclage wires appear intact.  Intertrochanteric and subtrochanteric fracture lines are noted.  Fracture fragments appear in satisfactory position and alignment.
IMPRESSION: Satisfactory appearance of left proximal femoral fracture which has been reduced with dynamic screw and intramedullary nail.

## 2008-03-19 IMAGING — CR DG FOREARM 2V*L*
2 series · 2 of 2 positions shown · non-contrast
Comparison: 04/29/06.

CLINICAL DATA: Recheck multiple fractures.
LEFT WRIST ? 3 VIEWS:

[view not recorded (1 of 2)]
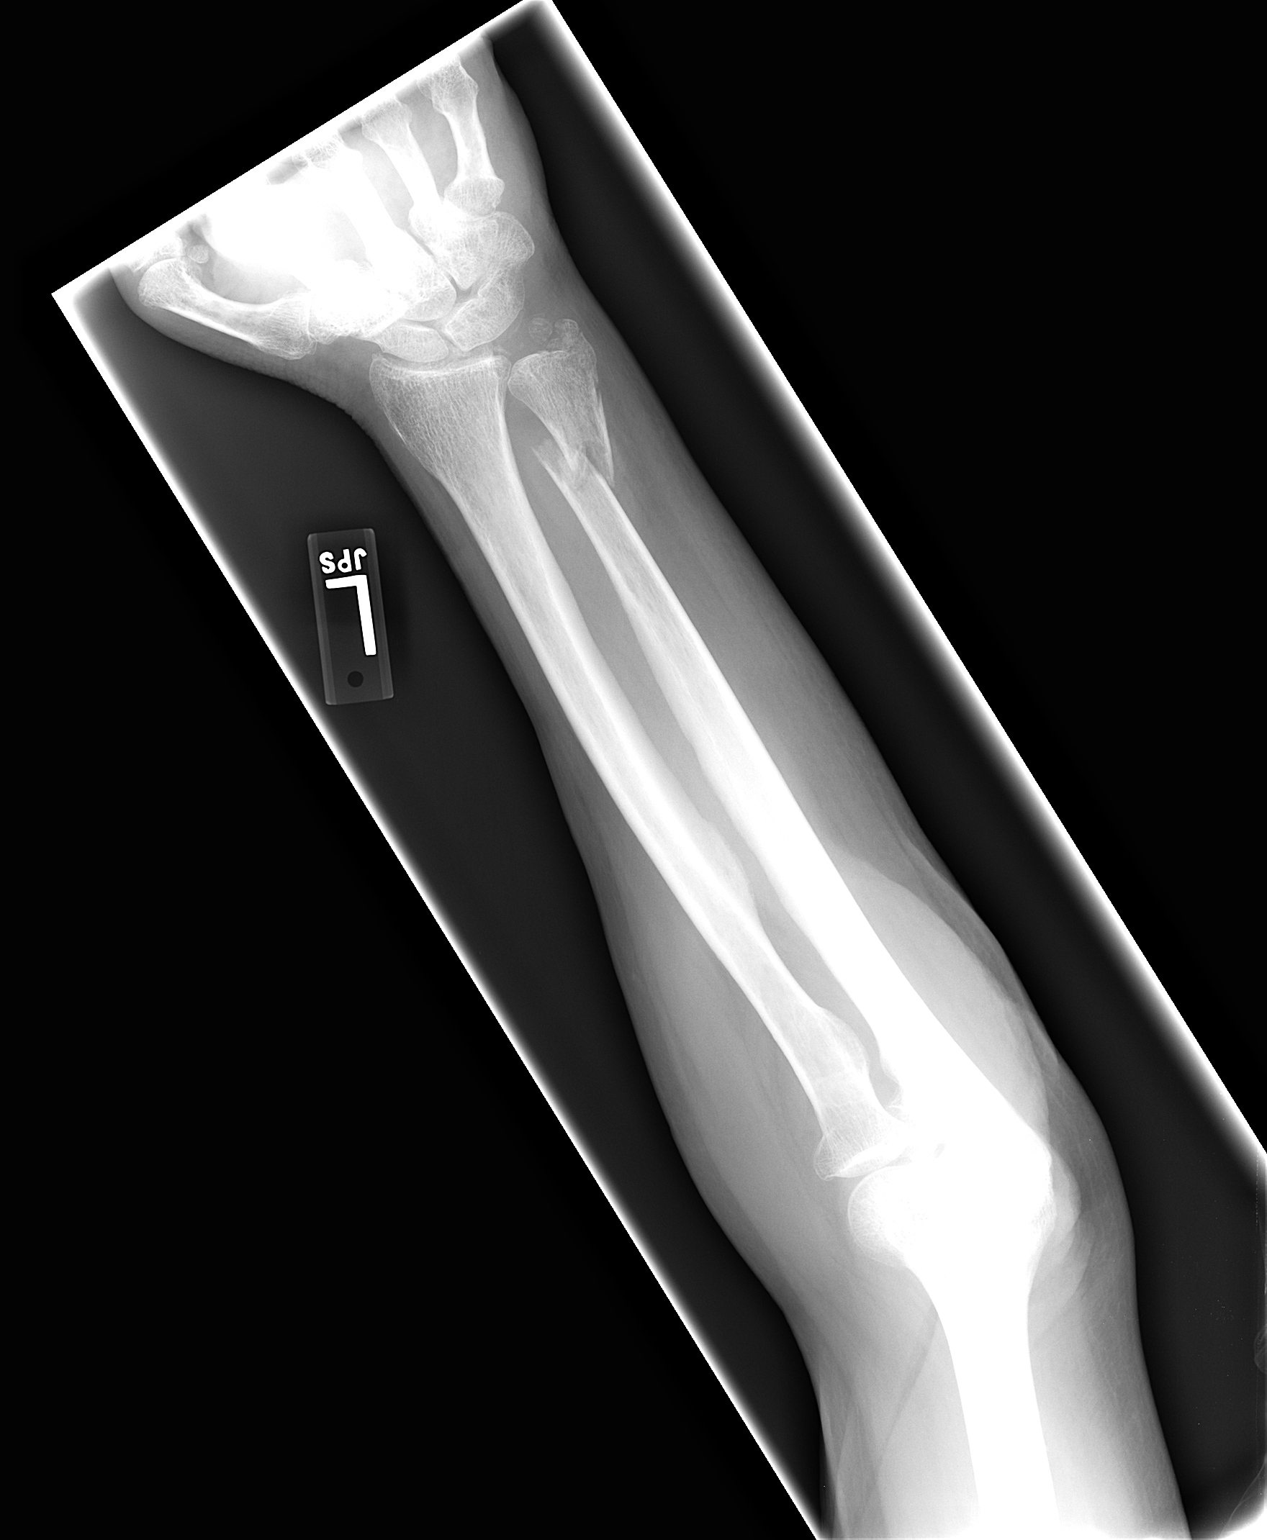

[view not recorded (2 of 2)]
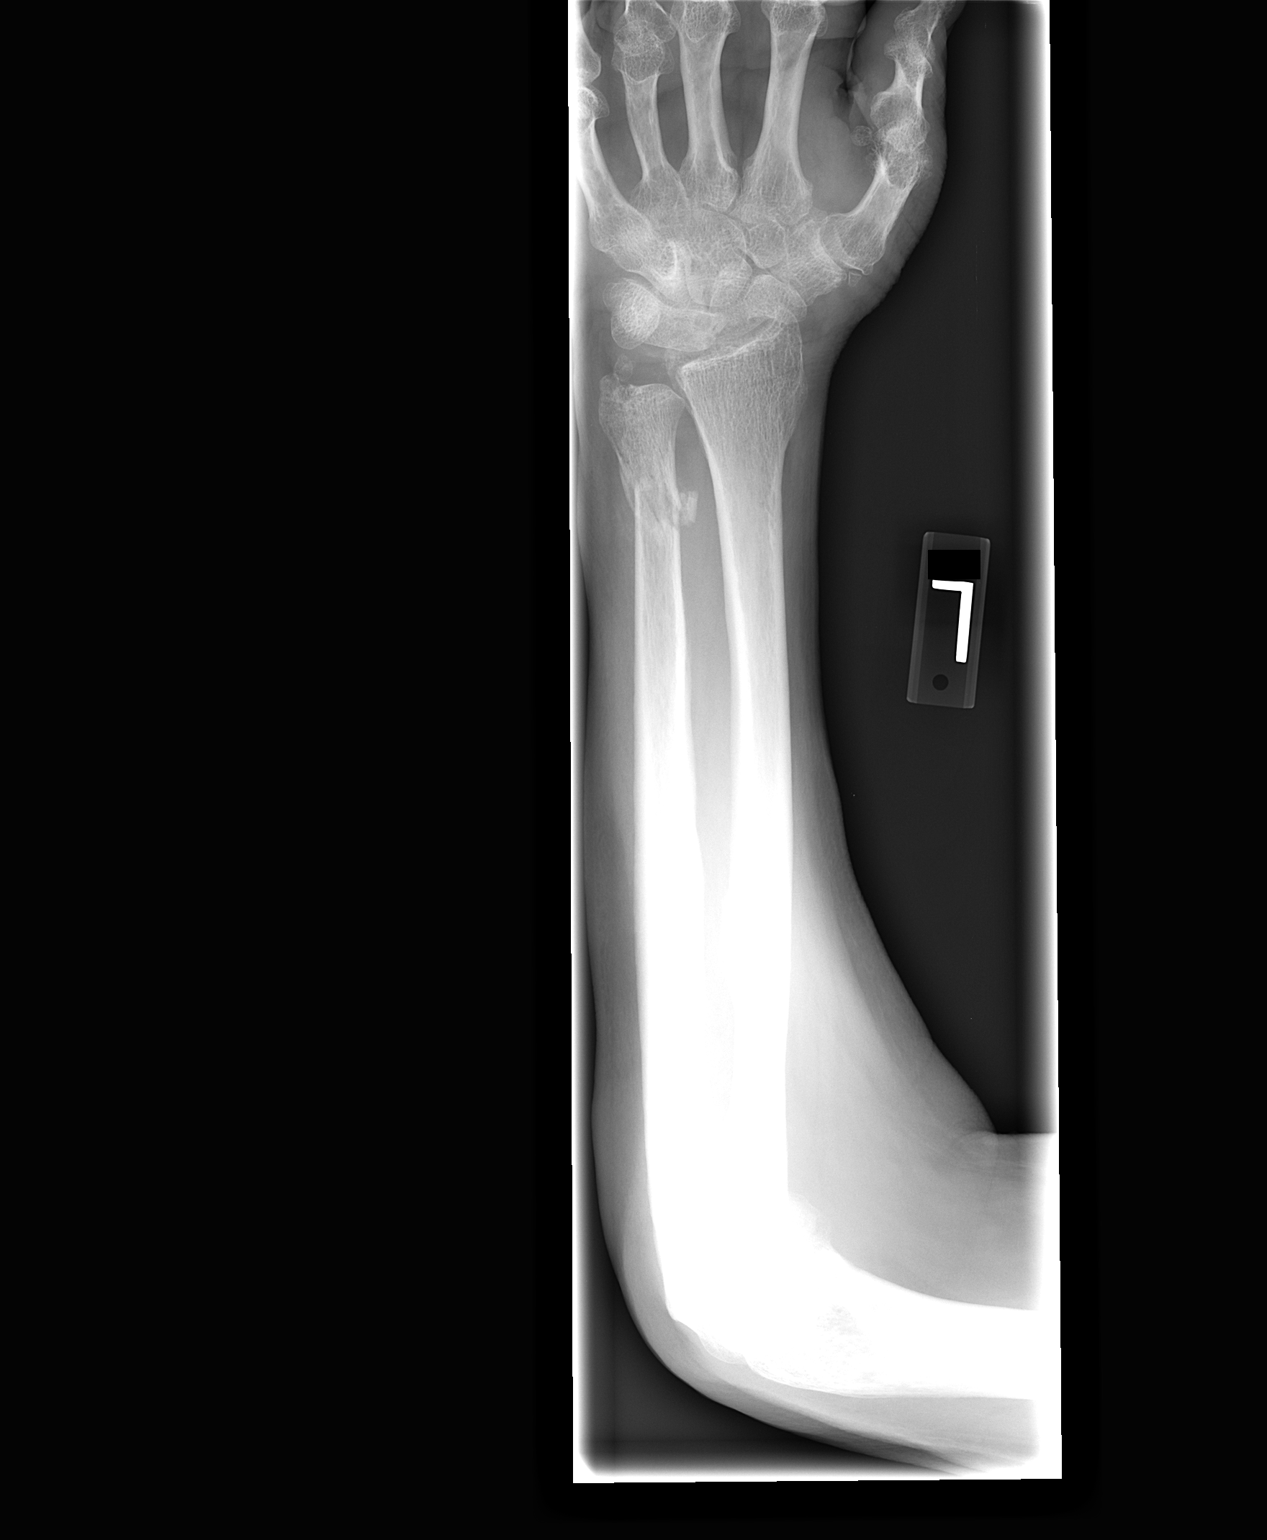

[2 of 2 positions shown; findings below may reference images not displayed]

FINDINGS: isplaced fracture of the distal left ulnar shaft again appreciated.  There is some foreshortening.  The distal fracture fragment is displaced laterally by approximately one half shaft diameter.  It is displaced anteriorly by nearly one shaft diameter.  There is no significant callus formation. Small oval bony densities adjacent to the ulnar styloid process appear corticated suggesting accessory ossicles vs. fracture fragments.
IMPRESSION: No significant change in position or alignment of displaced distal ulnar fracture fragments.
LEFT FOREARM ? 2 VIEW:
FINDINGS: A displaced fracture of the distal left ulna is again noted.  There is some osteopenia involving the distal ulnar shaft.  There appears to be a fracture of the ulnar styloid process which is not well appreciated on the views of the wrist.
IMPRESSION: Displaced distal left ulna fracture including involvement of the shaft and styloid process.
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
## 2008-05-01 ENCOUNTER — Inpatient Hospital Stay (HOSPITAL_COMMUNITY): Admission: EM | Admit: 2008-05-01 | Discharge: 2008-05-07 | Payer: Self-pay | Admitting: Emergency Medicine

## 2008-05-01 ENCOUNTER — Encounter (INDEPENDENT_AMBULATORY_CARE_PROVIDER_SITE_OTHER): Payer: Self-pay | Admitting: *Deleted

## 2008-05-02 ENCOUNTER — Ambulatory Visit: Payer: Self-pay | Admitting: Gastroenterology

## 2008-05-06 ENCOUNTER — Encounter (INDEPENDENT_AMBULATORY_CARE_PROVIDER_SITE_OTHER): Payer: Self-pay | Admitting: *Deleted

## 2008-05-07 ENCOUNTER — Encounter (INDEPENDENT_AMBULATORY_CARE_PROVIDER_SITE_OTHER): Payer: Self-pay | Admitting: *Deleted

## 2008-05-18 ENCOUNTER — Telehealth: Payer: Self-pay | Admitting: Internal Medicine

## 2008-05-24 ENCOUNTER — Emergency Department (HOSPITAL_COMMUNITY): Admission: EM | Admit: 2008-05-24 | Discharge: 2008-05-24 | Payer: Self-pay | Admitting: Emergency Medicine

## 2008-05-24 ENCOUNTER — Encounter (INDEPENDENT_AMBULATORY_CARE_PROVIDER_SITE_OTHER): Payer: Self-pay | Admitting: *Deleted

## 2008-06-30 ENCOUNTER — Ambulatory Visit: Payer: Self-pay | Admitting: Internal Medicine

## 2008-06-30 DIAGNOSIS — K5900 Constipation, unspecified: Secondary | ICD-10-CM | POA: Insufficient documentation

## 2008-06-30 DIAGNOSIS — K56609 Unspecified intestinal obstruction, unspecified as to partial versus complete obstruction: Secondary | ICD-10-CM | POA: Insufficient documentation

## 2008-07-18 IMAGING — CR DG KNEE 1-2V PORT*R*
2 series · 2 of 2 positions shown · non-contrast
Comparison: none

CLINICAL DATA: Osteoarthritis. Right total knee replacement.  
PORTABLE RIGHT KNEE - 2 VIEW:

[view not recorded (1 of 2)]
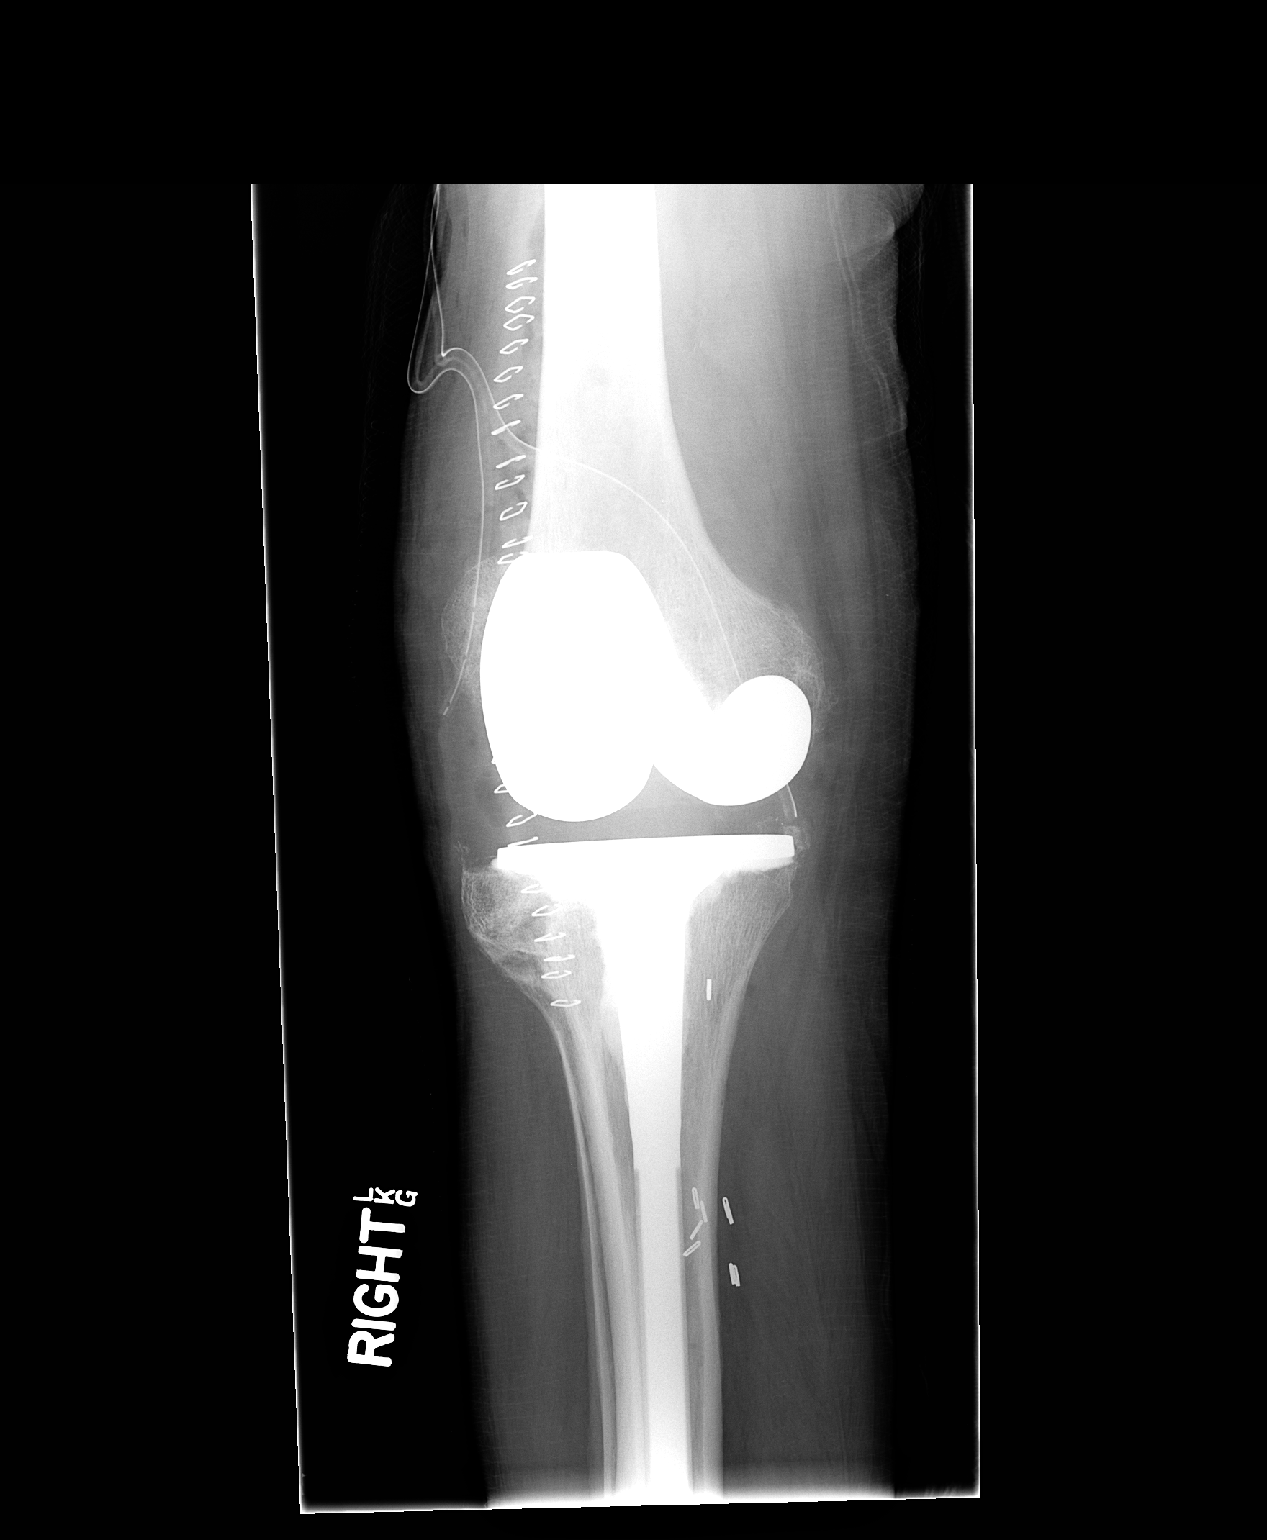

[view not recorded (2 of 2)]
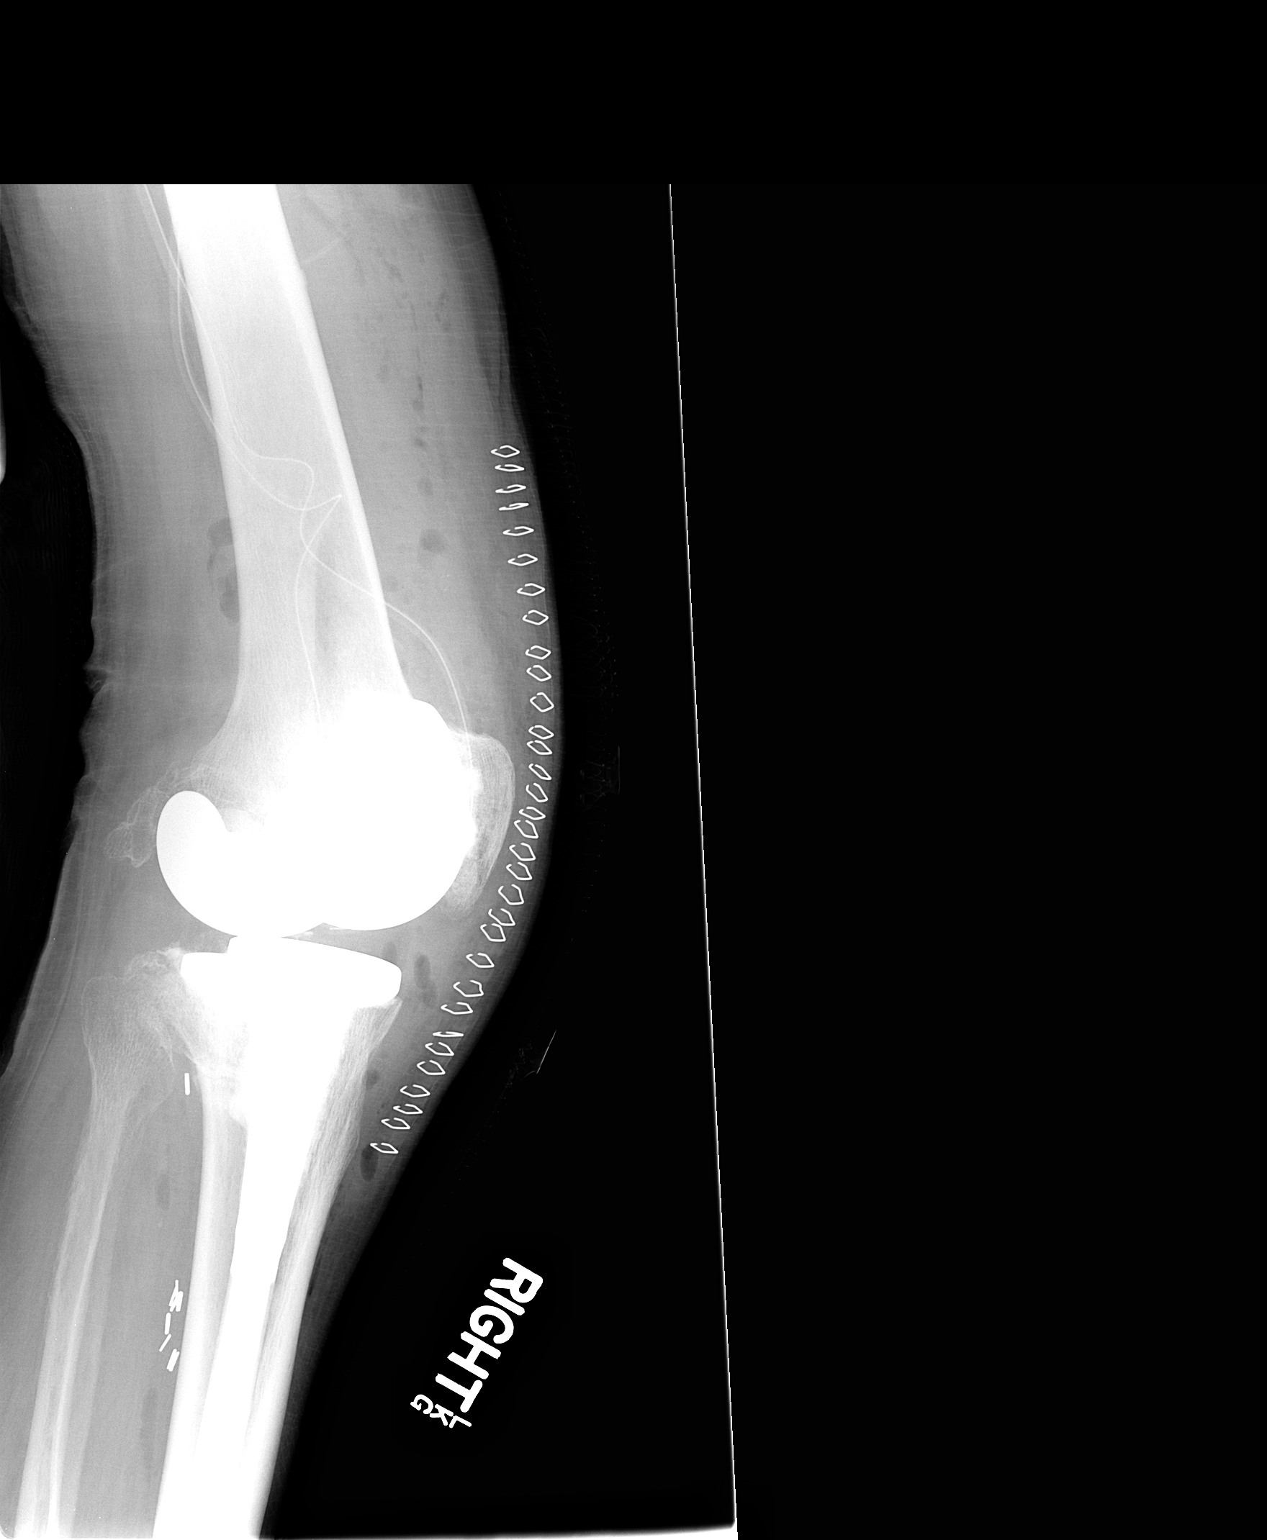

[2 of 2 positions shown; findings below may reference images not displayed]

FINDINGS: AP and lateral views demonstrate satisfactory position right TKR.  Air is noted in the soft tissues.  Surgical drain in good position.
IMPRESSION: Right TKR, satisfactory appearance.

## 2008-08-02 IMAGING — CT CT PELVIS W/O CM
2 of 4 series · 17 of 46 positions shown, 19 images · IV contrast (APPLIED)
Comparison: none

CLINICAL DATA: Evaluate left hip nonunion.
 PELVIS CT WITHOUT CONTRAST:
TECHNIQUE: Multidetector CT imaging of the pelvis was performed following the standard protocol without IV contrast.

[Series 2: pelvis 5.0 b31f · axial · 0.74mm/px · z∈[-172,+38]mm · 14 of 50 slices shown, 16 images]
[im 4/50  soft-tissue]
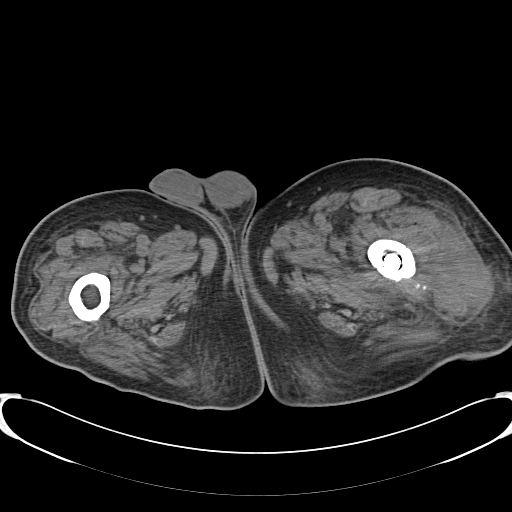
[im 4/50  bone]
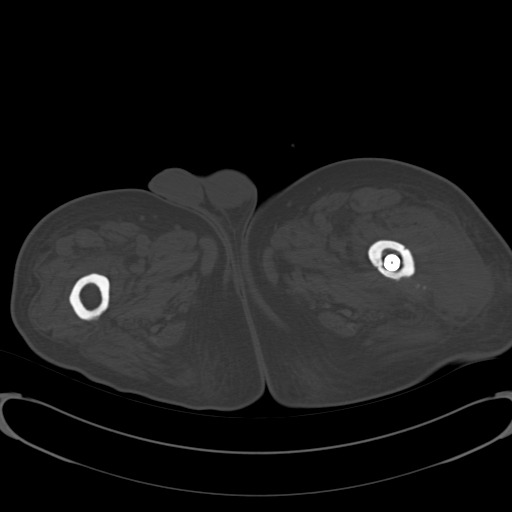
[im 7/50  soft-tissue]
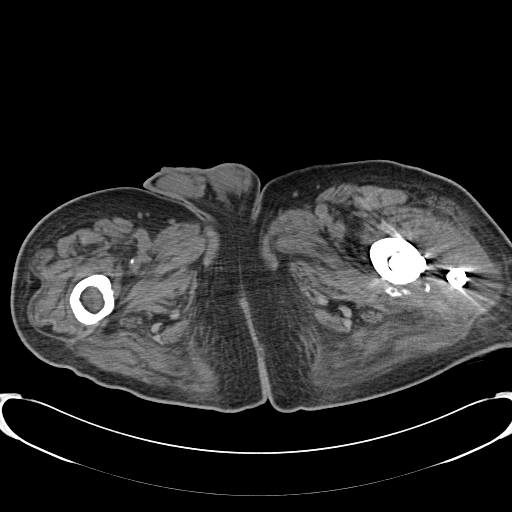
[im 10/50  soft-tissue]
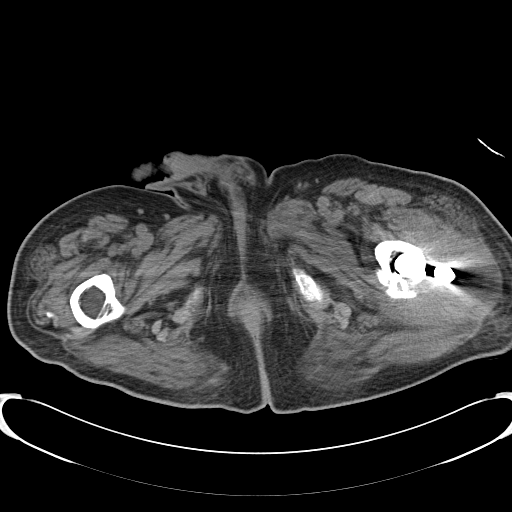
[im 13/50  soft-tissue]
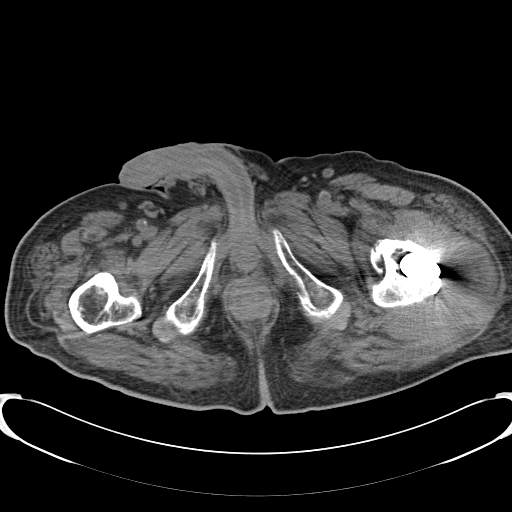
[im 16/50  soft-tissue]
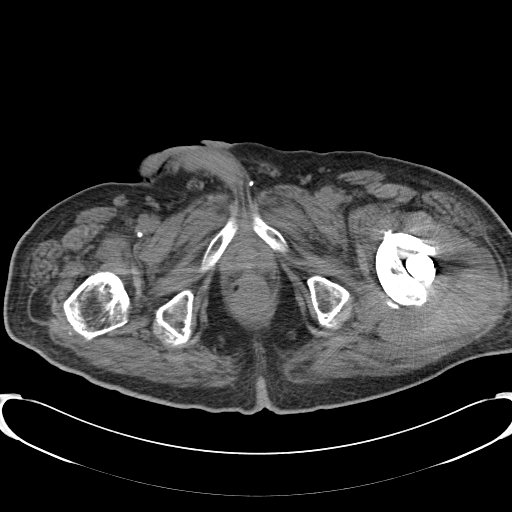
[im 19/50  soft-tissue]
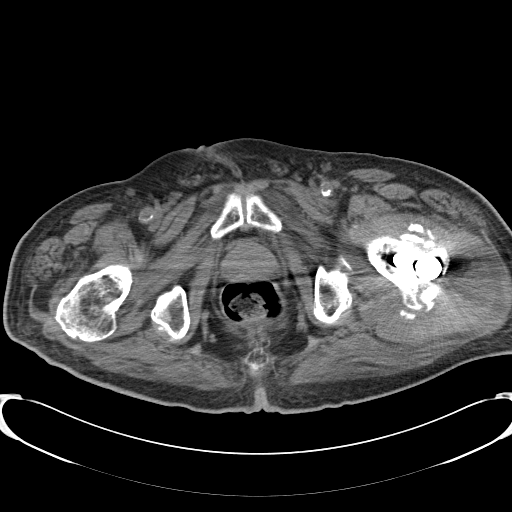
[im 23/50  soft-tissue]
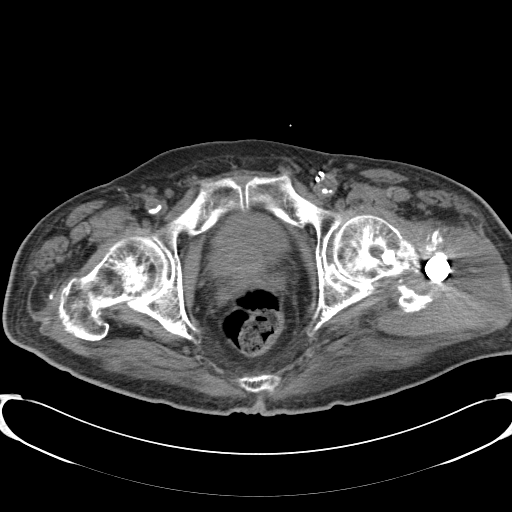
[im 27/50  soft-tissue]
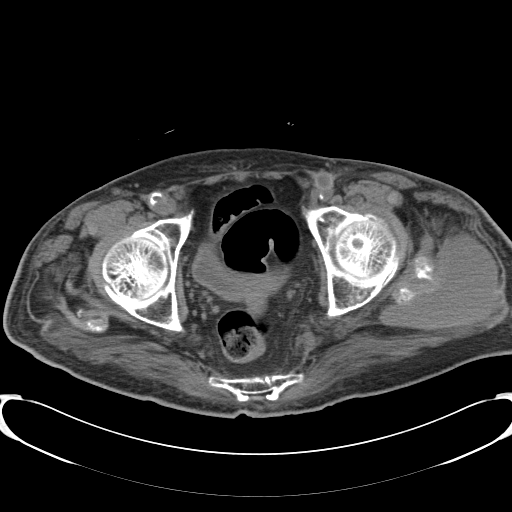
[im 31/50  soft-tissue]
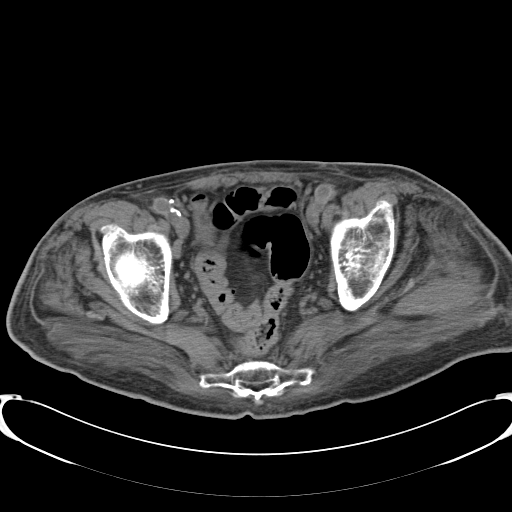
[im 31/50  bone]
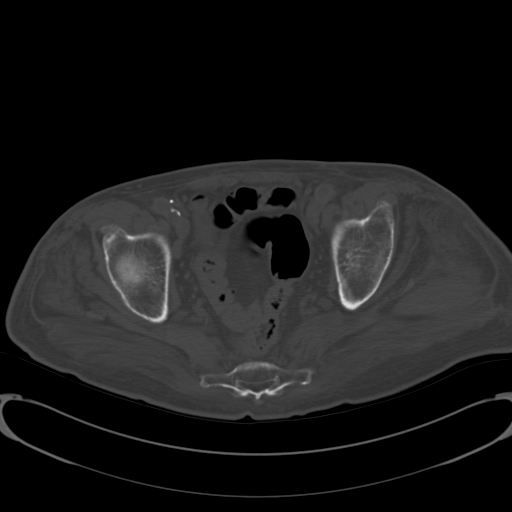
[im 34/50  soft-tissue]
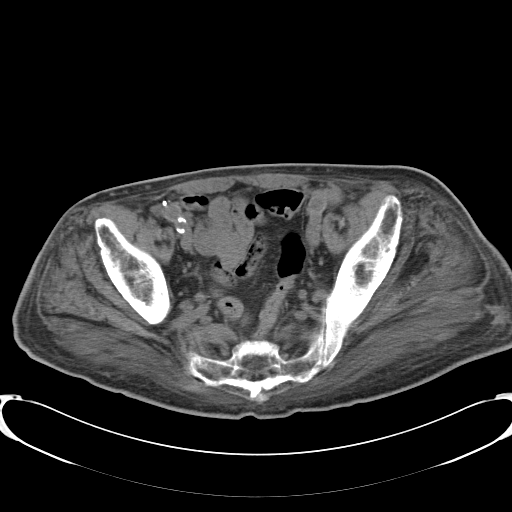
[im 37/50  soft-tissue]
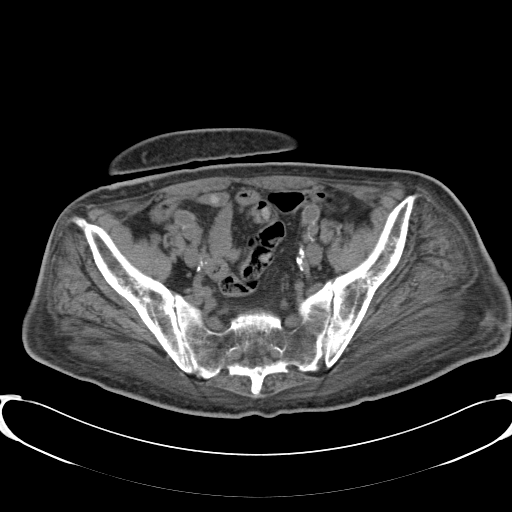
[im 40/50  soft-tissue]
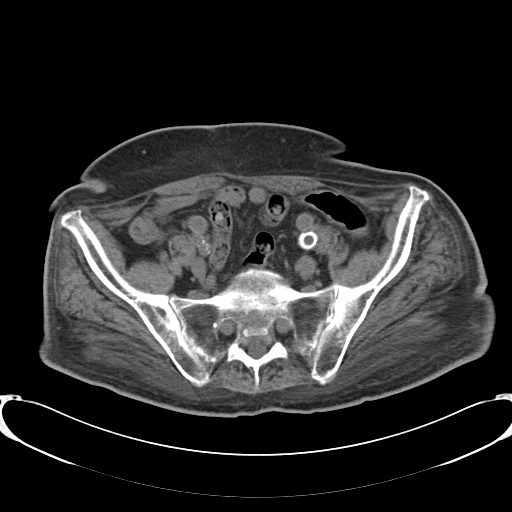
[im 43/50  soft-tissue]
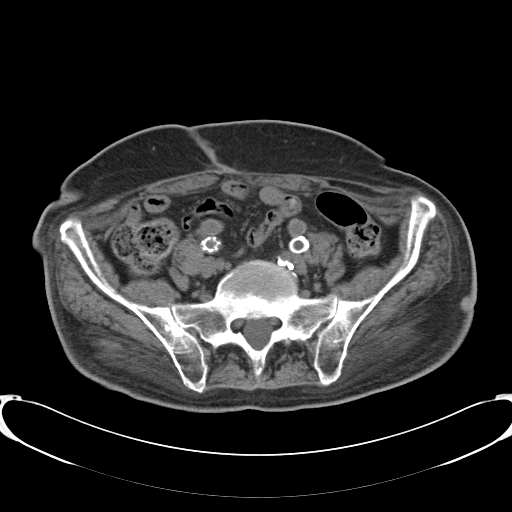
[im 46/50  soft-tissue]
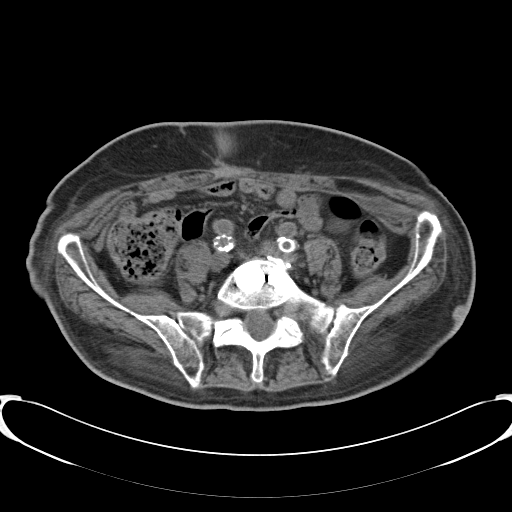

[Series 602: coronal left hip · coronal · 0.49mm/px · 3 of 151 slices shown]
[im 38/151  soft-tissue]
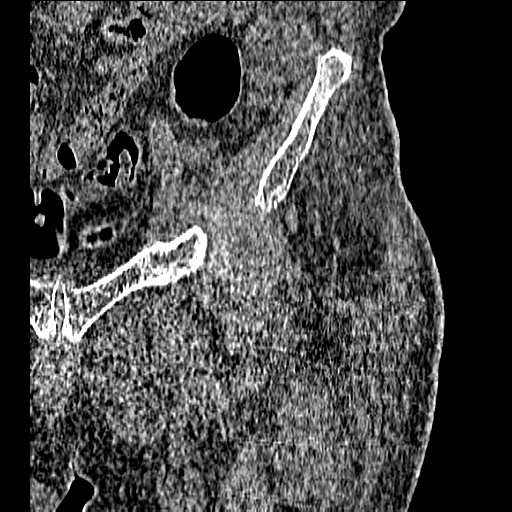
[im 76/151  soft-tissue]
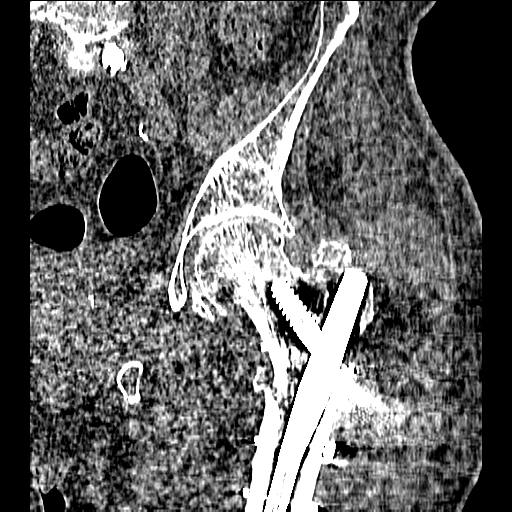
[im 113/151  soft-tissue]
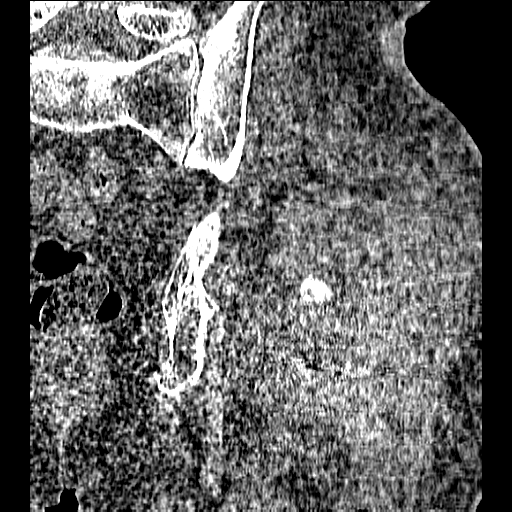

[17 of 46 positions shown; findings below may reference images not displayed]

FINDINGS: Patient has a femoral intramedullary nail in place.  There is a compression screw, which has come loose from its original position and has backed [DATE] to 4? cm.  There is nonunion of the fracture fragments at the greater trochanter.  Multiple cerclage wires are seen and appear well positioned.  There is nonunion of the multiple intertrochanteric fracture lines.
IMPRESSION: 1. Nonunion at this time.
 2. The compression screw component has come loose and backed out at least 4cm.

## 2008-08-02 IMAGING — CR DG CHEST 2V
1 series · 1 of 1 positions shown · non-contrast
Comparison: 03/23/04.

CLINICAL DATA: Preop for hip surgery.
 CHEST - 2 VIEW:

[w chest lat]
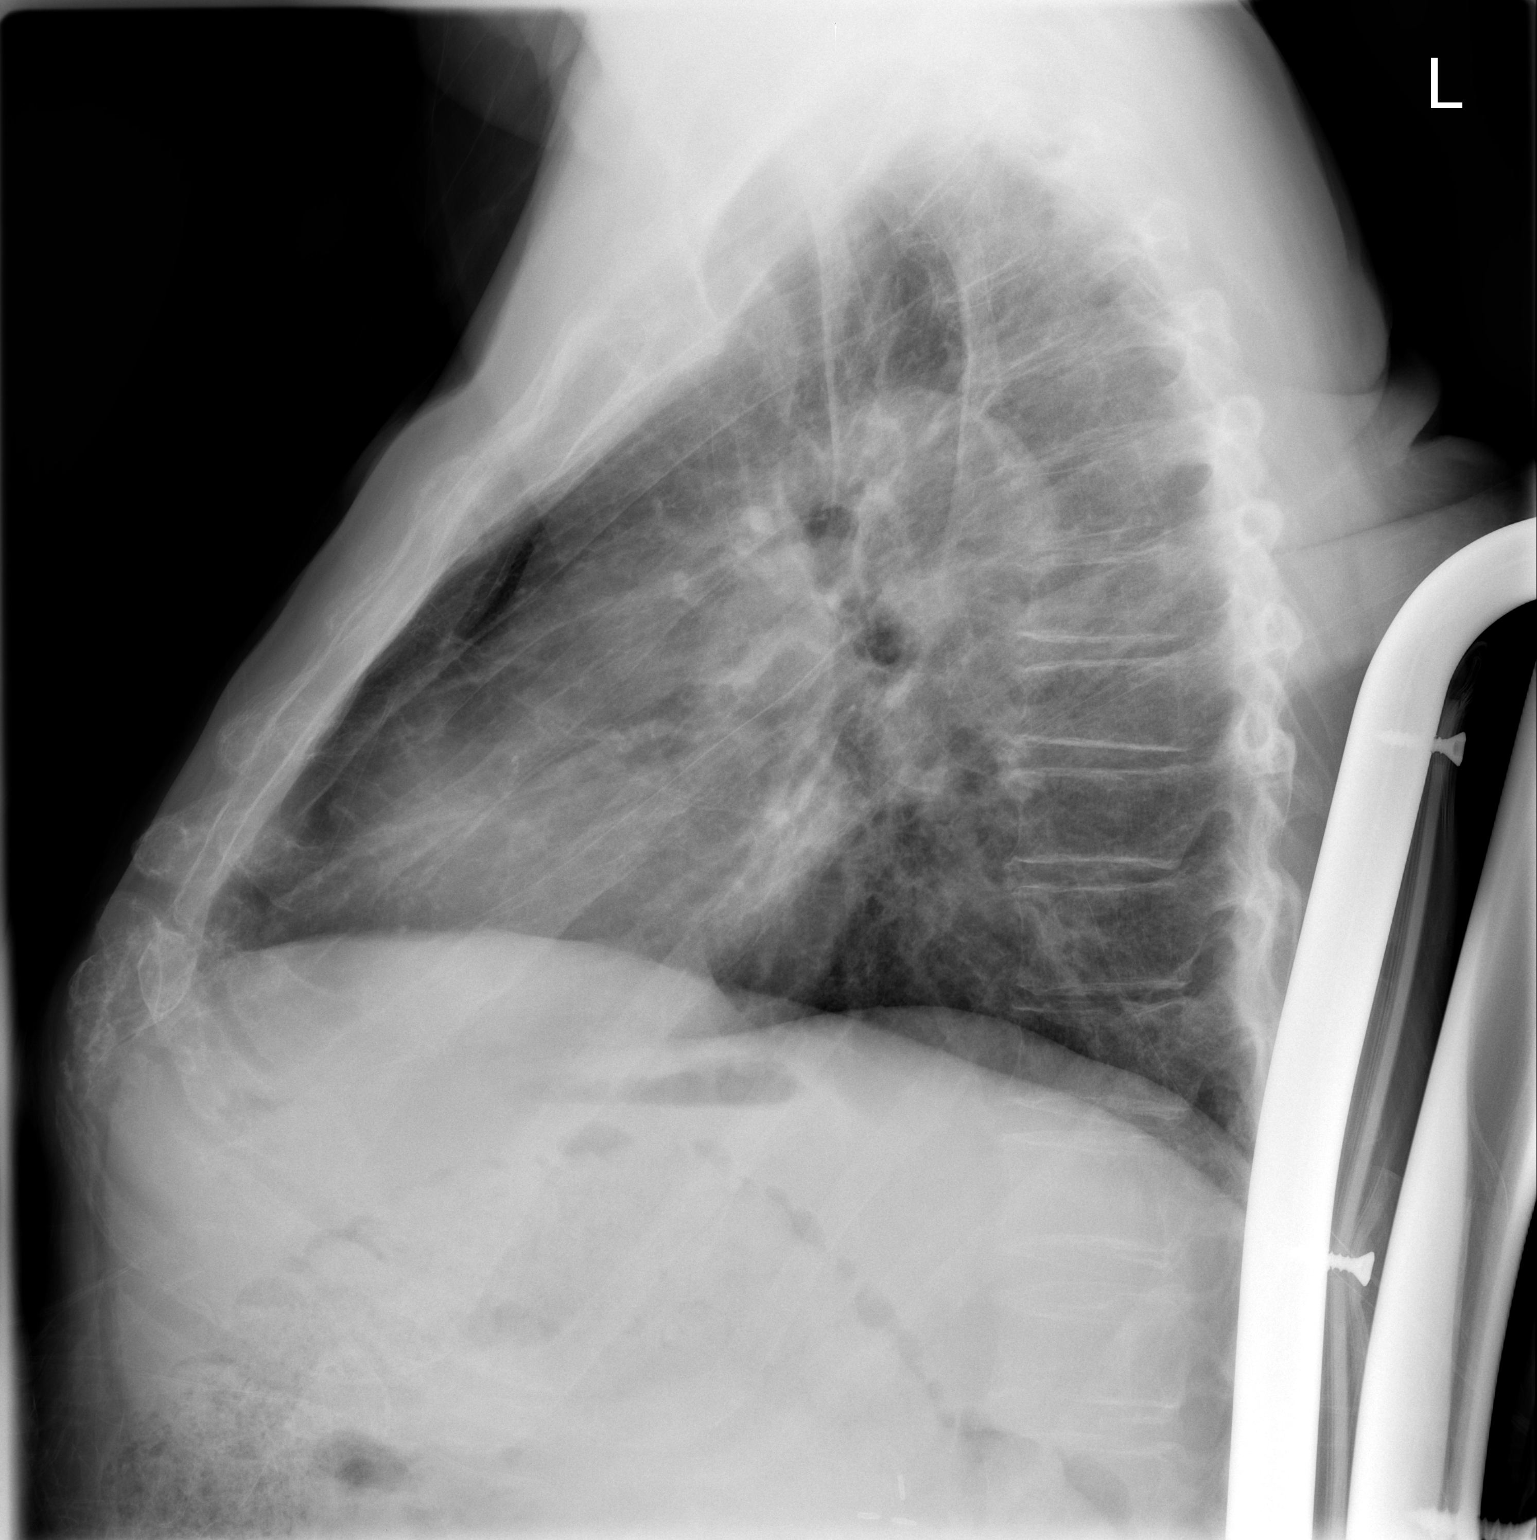

[1 of 1 positions shown; findings below may reference images not displayed]

FINDINGS: Two views of the chest show the lungs to be clear.  The heart is within upper limits of normal.  The bones are osteopenic.
IMPRESSION: No active lung disease.
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
## 2008-10-27 ENCOUNTER — Encounter: Admission: RE | Admit: 2008-10-27 | Discharge: 2008-10-27 | Payer: Self-pay | Admitting: Internal Medicine

## 2008-10-28 ENCOUNTER — Inpatient Hospital Stay (HOSPITAL_COMMUNITY): Admission: EM | Admit: 2008-10-28 | Discharge: 2008-10-29 | Payer: Self-pay | Admitting: Emergency Medicine

## 2008-10-28 ENCOUNTER — Ambulatory Visit: Payer: Self-pay | Admitting: Internal Medicine

## 2008-12-06 ENCOUNTER — Encounter: Admission: RE | Admit: 2008-12-06 | Discharge: 2008-12-06 | Payer: Self-pay | Admitting: Internal Medicine

## 2009-02-03 ENCOUNTER — Encounter: Admission: RE | Admit: 2009-02-03 | Discharge: 2009-02-03 | Payer: Self-pay | Admitting: Internal Medicine

## 2009-03-25 IMAGING — RF DG HIP OPERATIVE*L*
1 series · 3 of 3 positions shown · non-contrast
Comparison: none

CLINICAL DATA: Total hip revision

LEFT HIP - OPERATIVE:

[Series 1: run · 3 of 3 slices shown]
[im 1/3]
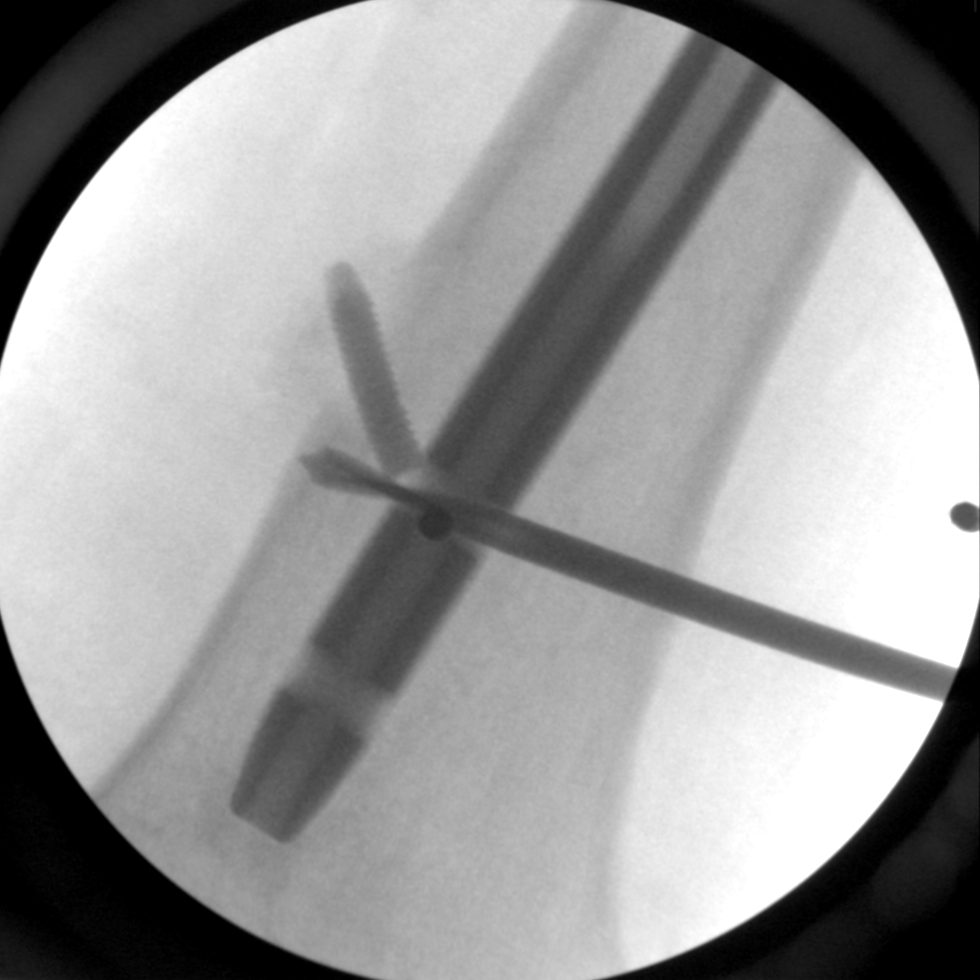
[im 2/3]
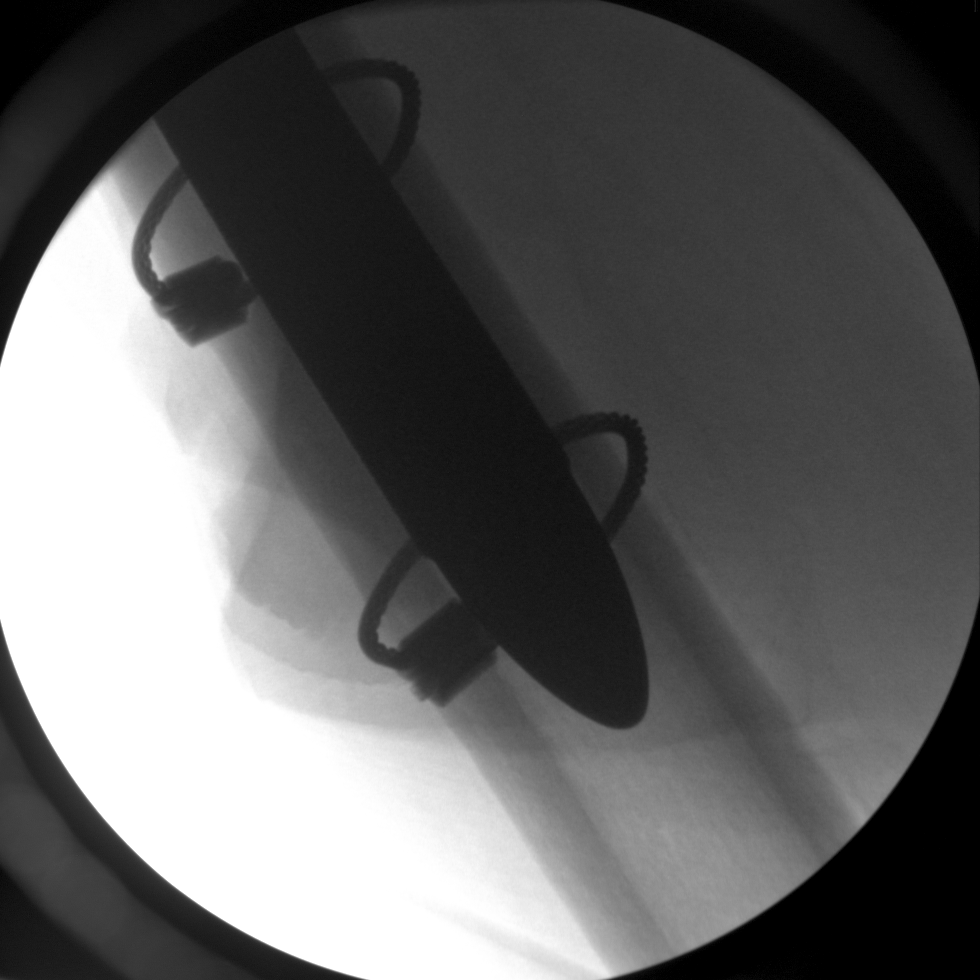
[im 3/3]
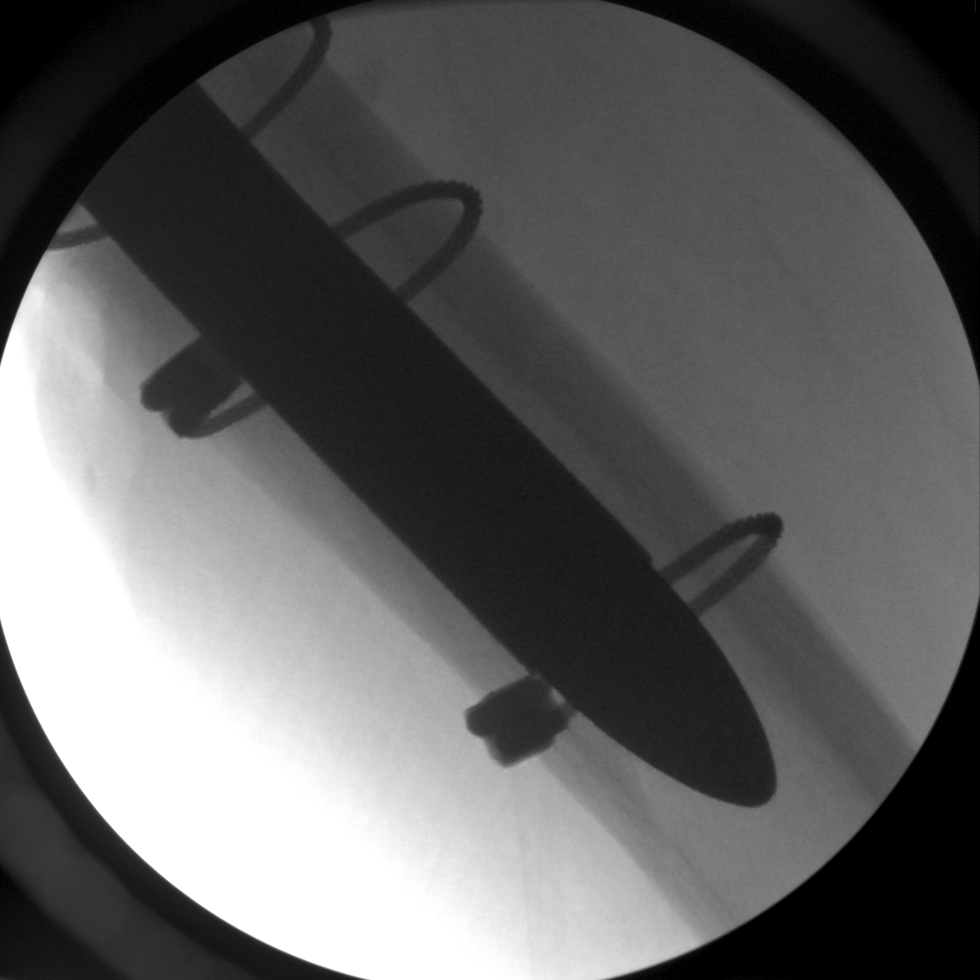

[3 of 3 positions shown; findings below may reference images not displayed]

FINDINGS: Three intraoperative spot images are submitted from left hardware
revision. Final 2 images show cerclage wires around the distal tip of the left
hip prosthesis. Initial image shows a screw fragment which was possibly removed.
Recommend correlation.
IMPRESSION: Total hip revision as above.

## 2009-03-28 IMAGING — CR DG ABD PORTABLE 1V
1 series · 1 of 1 positions shown · non-contrast
Comparison: none

CLINICAL DATA: Left hip avascular necrosis. Postop. Panda feeding tube placement.
 PORTABLE ABDOMEN ? 1 VIEW:

[AP]
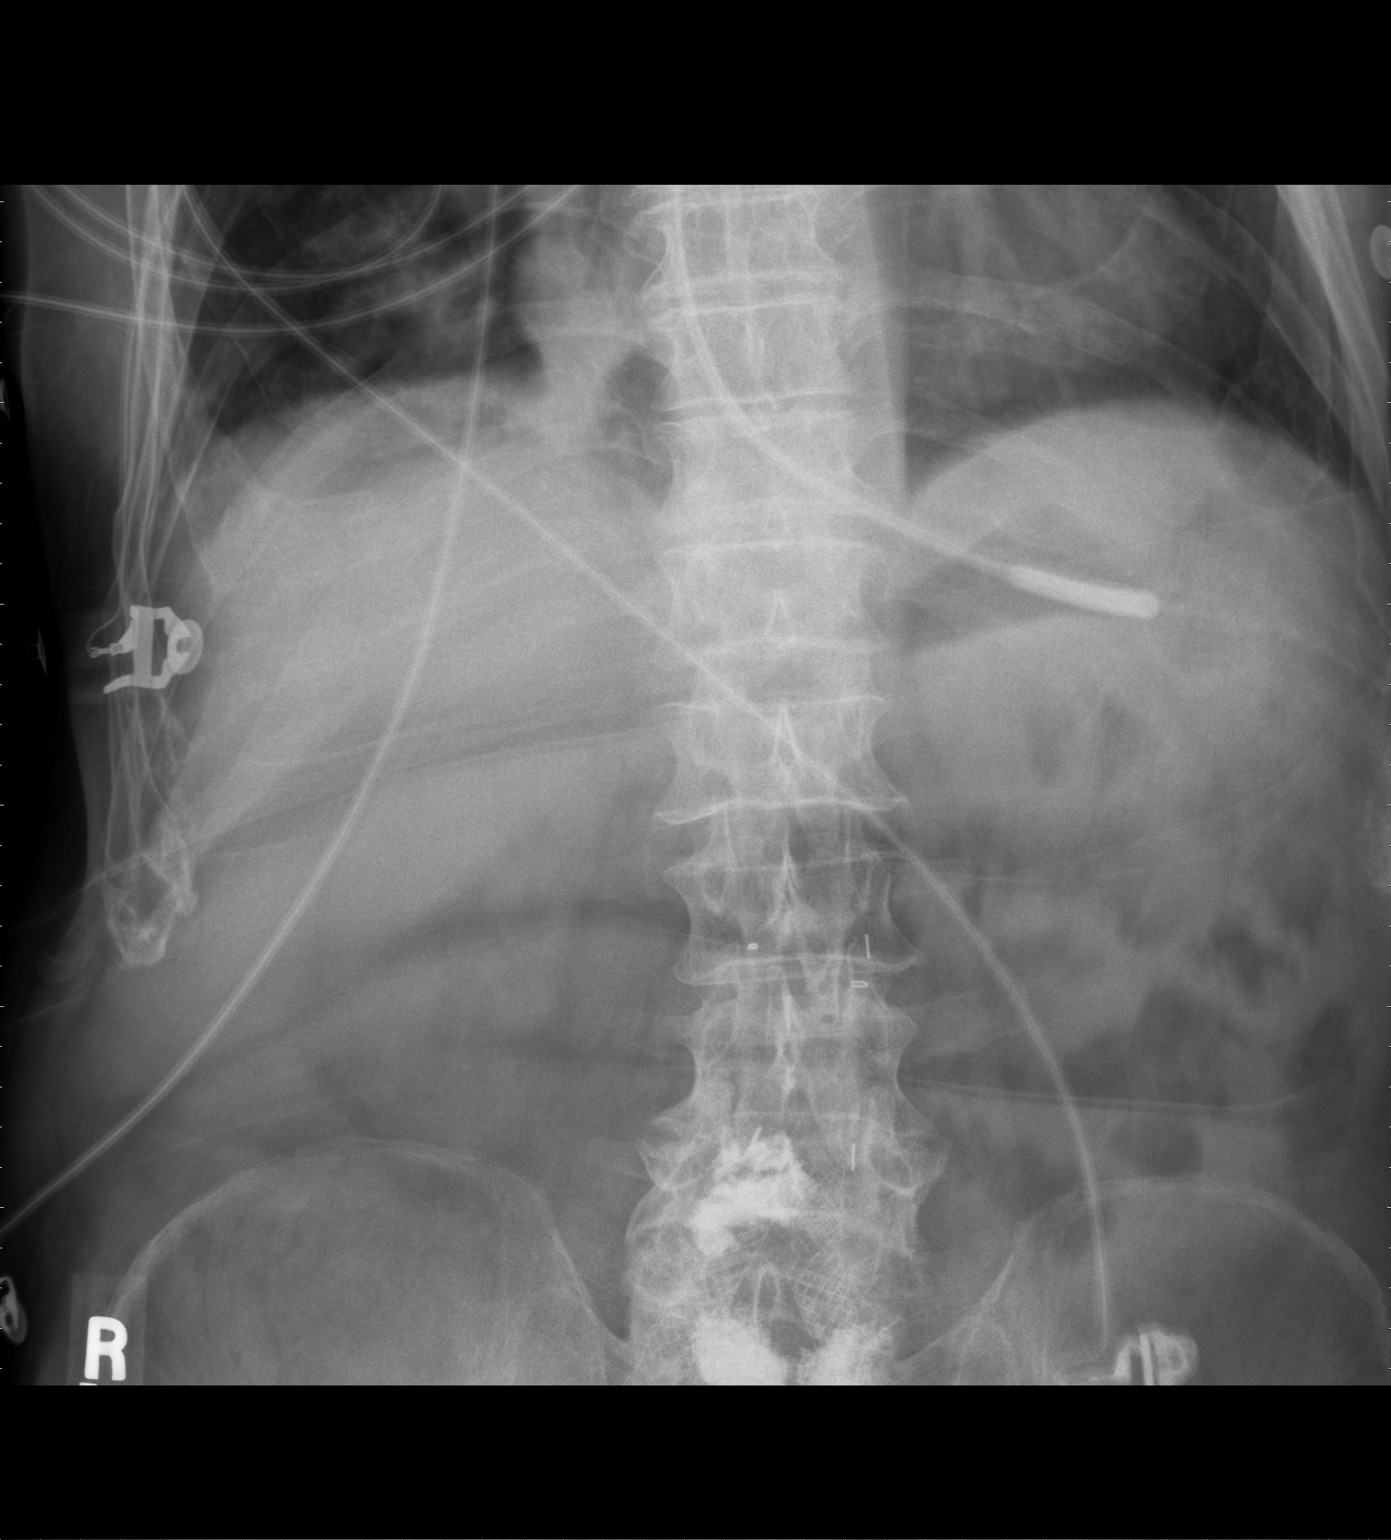

[1 of 1 positions shown; findings below may reference images not displayed]

FINDINGS: Panda feeding tube is seen with tip in the proximal stomach.  No dilated bowel loops are seen.
IMPRESSION: Panda feeding tube tip in the proximal stomach.

## 2009-03-29 IMAGING — CR DG CHEST 1V PORT
1 series · 1 of 1 positions shown · non-contrast
Comparison: 05/15/2007

CLINICAL DATA: Respiratory distress

[AP]
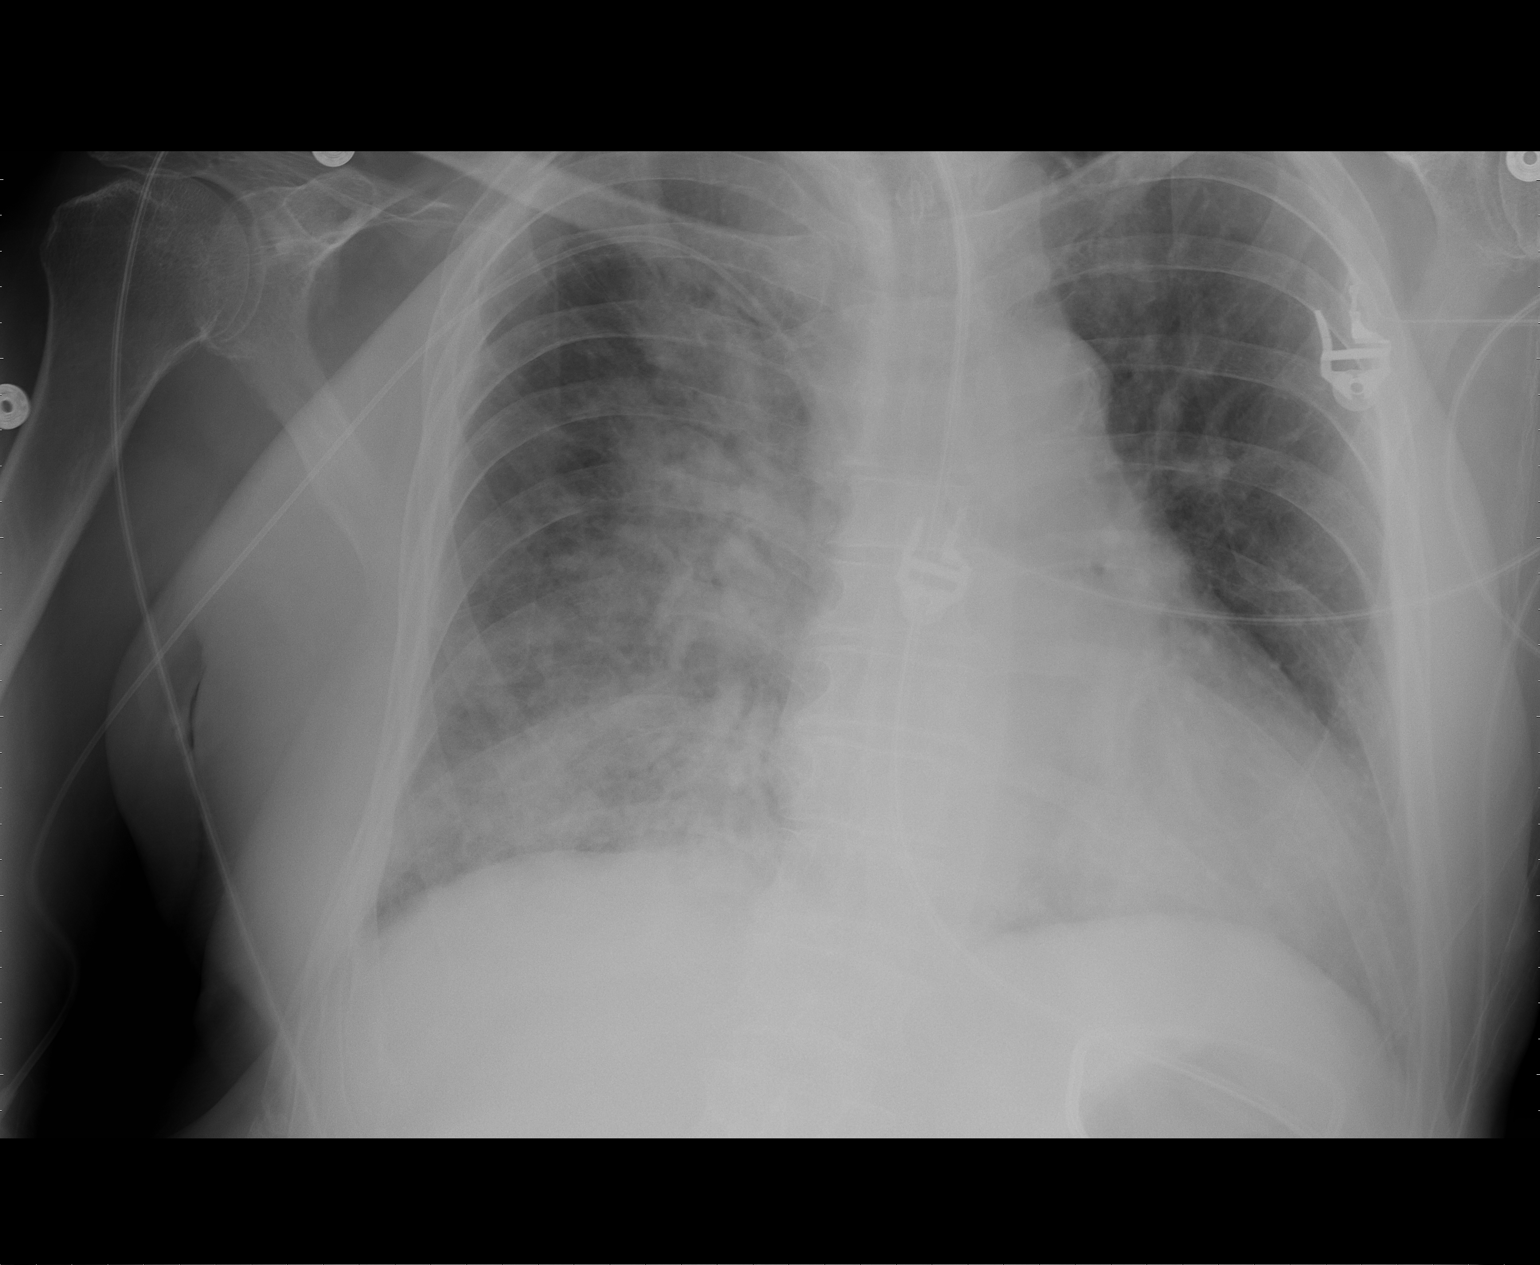

[1 of 1 positions shown; findings below may reference images not displayed]

PORTABLE CHEST - 1 VIEW:

6891 hours. There has been interval progression of the asymmetric air space
disease, right greater than left. Cardiopericardial silhouette is enlarged as
before. A feeding catheter extends into the stomach although the distal tip is
not visualized on this film. A right PICC line tip is obscured by cardiac motion
and cannot be definitively localized. On the previous film, it was projecting in
the right atrium.
IMPRESSION: Slight progression of the diffuse asymmetric airspace disease in the right lung
with some increase in left basilar alveolar disease.

## 2009-03-29 IMAGING — CR DG ABD PORTABLE 1V
1 series · 1 of 1 positions shown · non-contrast
Comparison: 05/15/2007

CLINICAL DATA: Feeding tube placement

[AP]
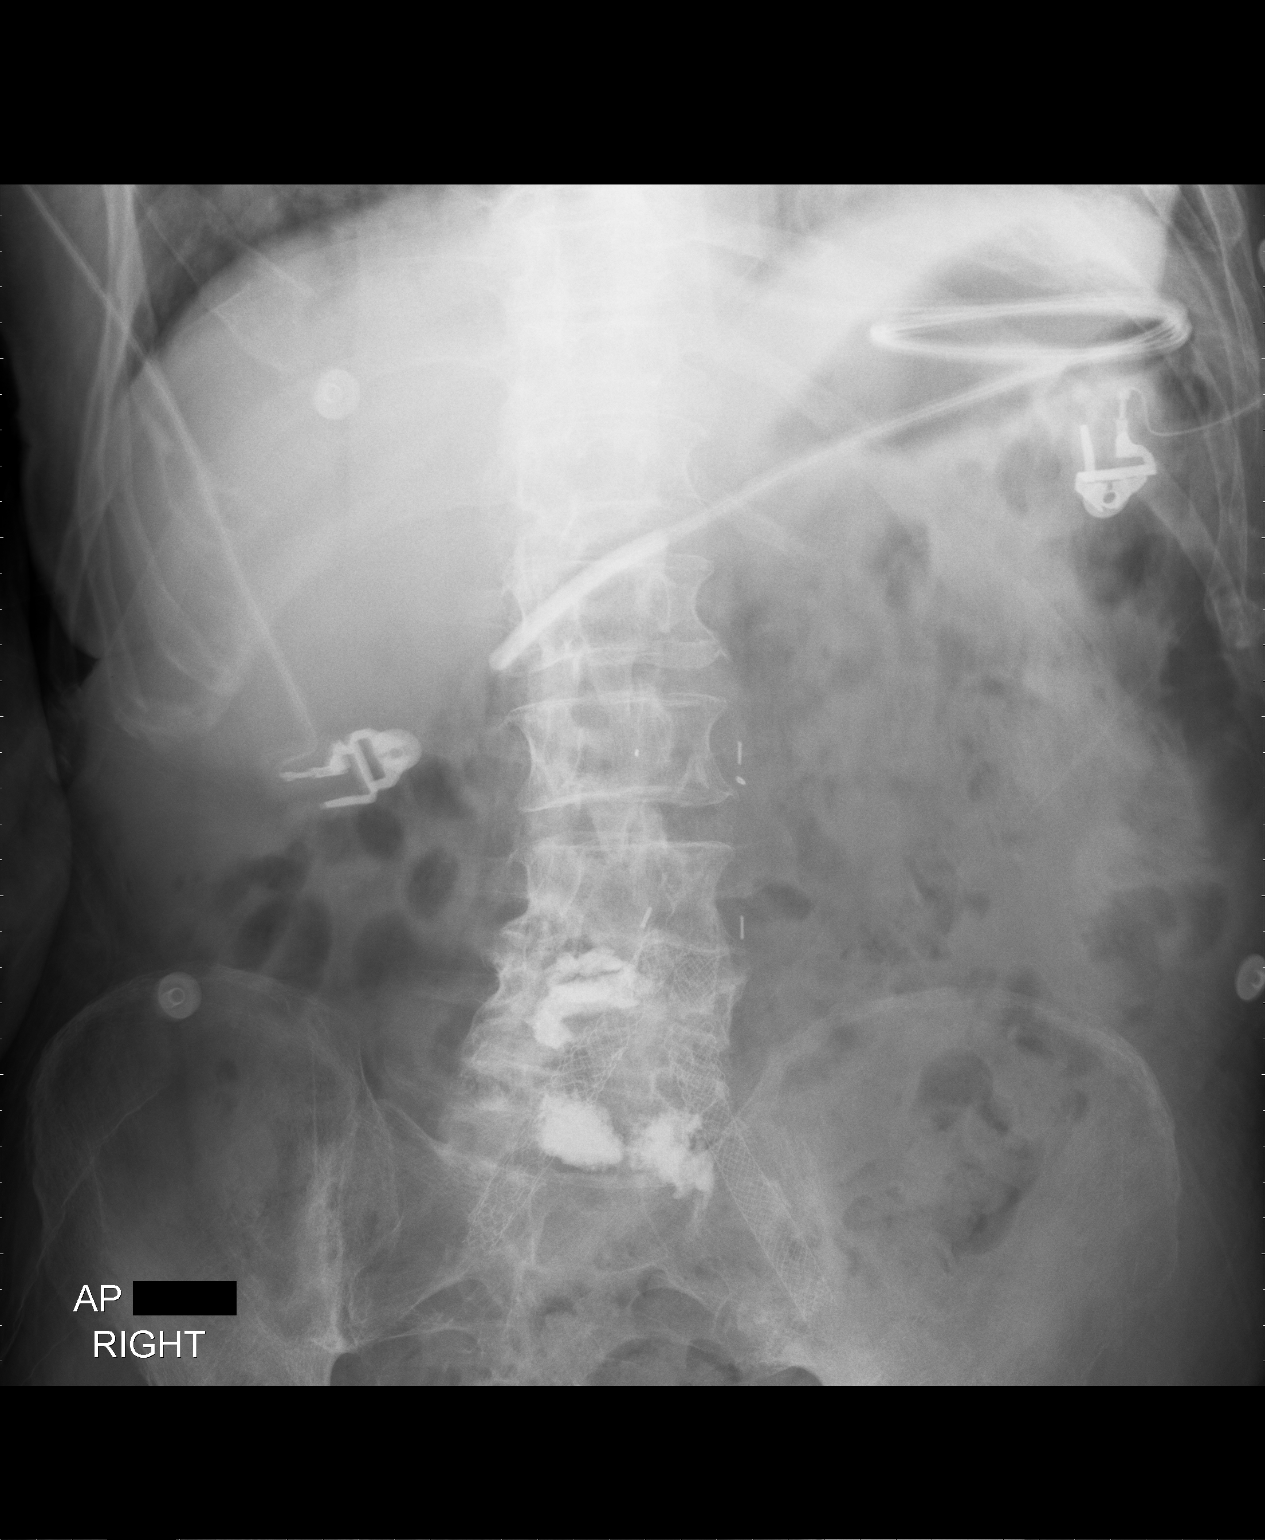

[1 of 1 positions shown; findings below may reference images not displayed]

PORTABLE ABDOMEN - 1 VIEW:

5344 hours. A feeding tube is looped in the stomach with the tip of the feeding
tube positioned at the level of the pylorus, directed towards the duodenum.
Bowel gas pattern is nonspecific. Patient status post lumbar vertebroplasty.
Bilateral common iliac stents are noted.
IMPRESSION: Feeding tube is looped in the stomach with the distal tip positioned at the
pylorus, directed distally.

## 2009-03-30 IMAGING — CR DG CHEST 1V PORT
1 series · 1 of 1 positions shown · non-contrast
Comparison: One day prior

CLINICAL DATA: Retained rod

CHEST - 1 VIEW

[view not recorded]
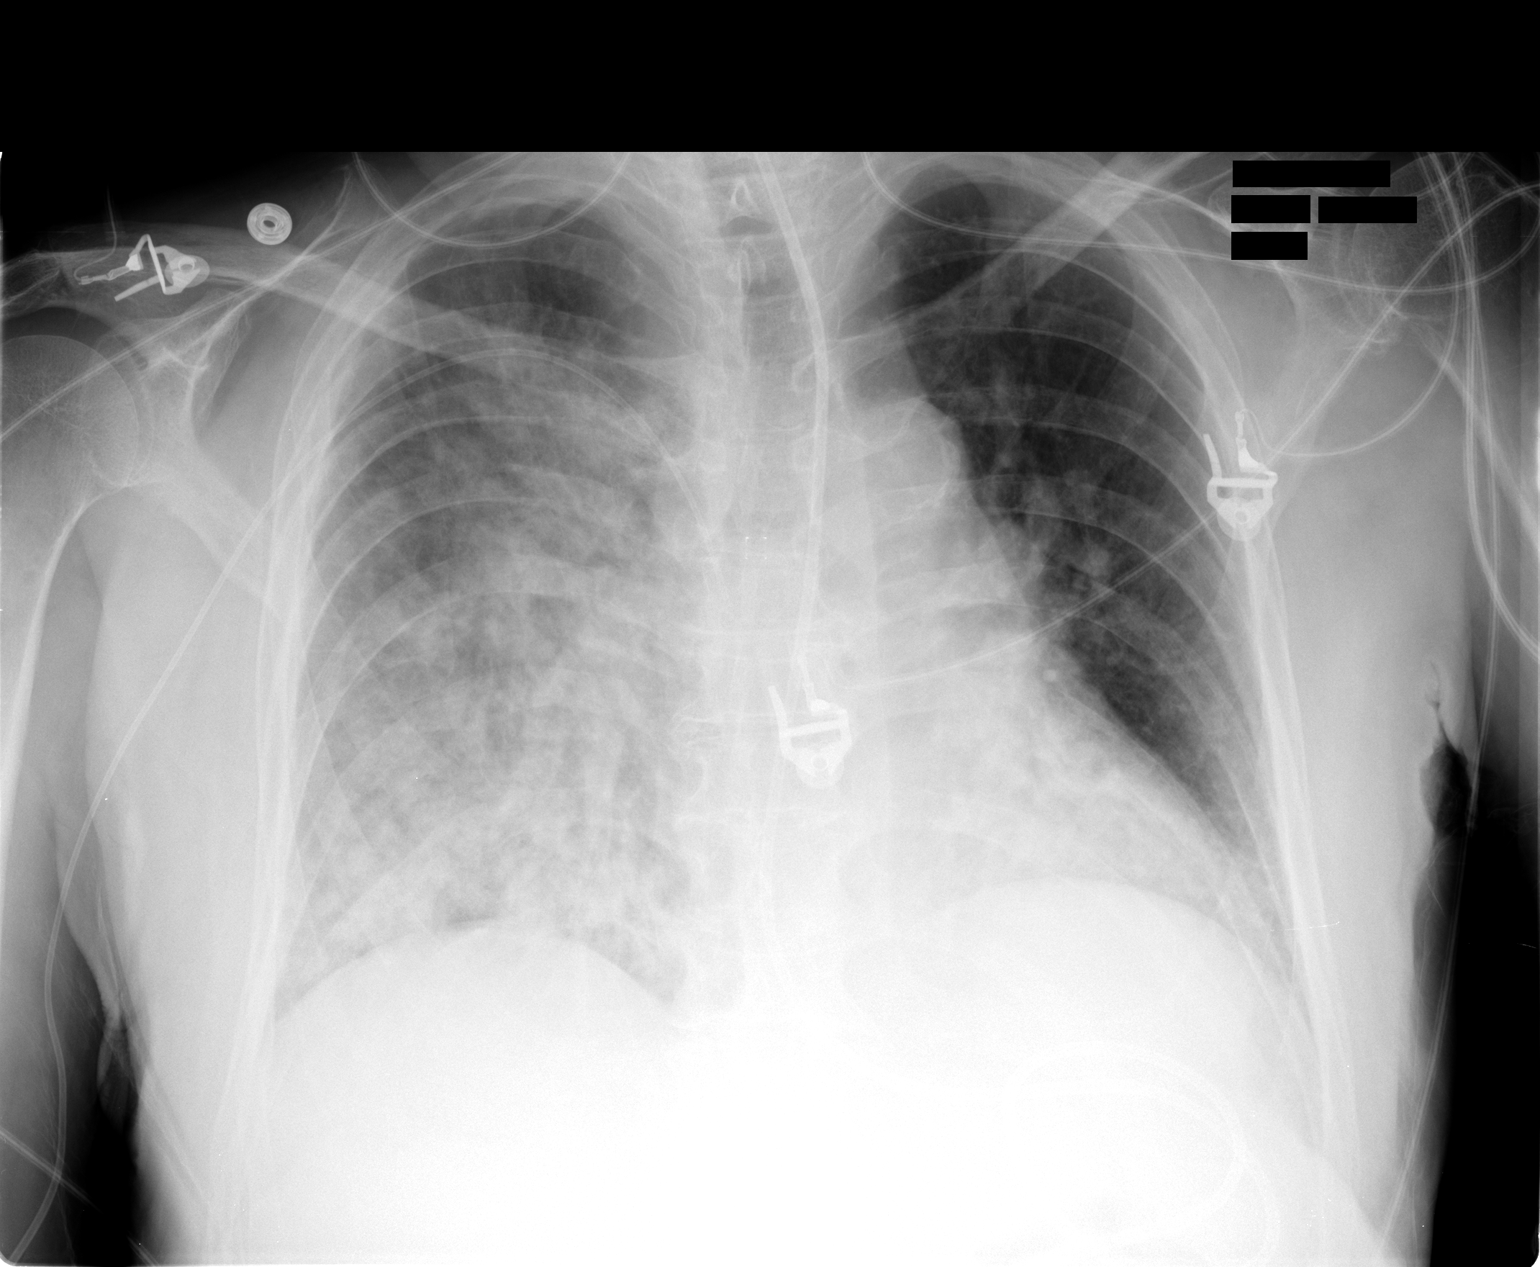

[1 of 1 positions shown; findings below may reference images not displayed]

FINDINGS: Right-sided PICC line and feeding tube unchanged. Mild cardiomegaly.
Minimal improvement in left-sided aeration with increased right-sided diffuse
airspace disease. No definite pleural fluid. No pneumothorax.

IMPRESSION

1. Worsened right and improved left-sided aeration. Findings are suspicious for
infection/aspiration, less likely asymmetric pulmonary edema.

## 2009-03-31 IMAGING — CR DG CHEST 1V PORT
1 series · 1 of 1 positions shown · non-contrast
Comparison: Earlier study on the same date.

CLINICAL DATA: Endotracheal tube placement.
 PORTABLE CHEST ? 1 VIEW ? 05/18/07 ? 7 PM:

[view not recorded]
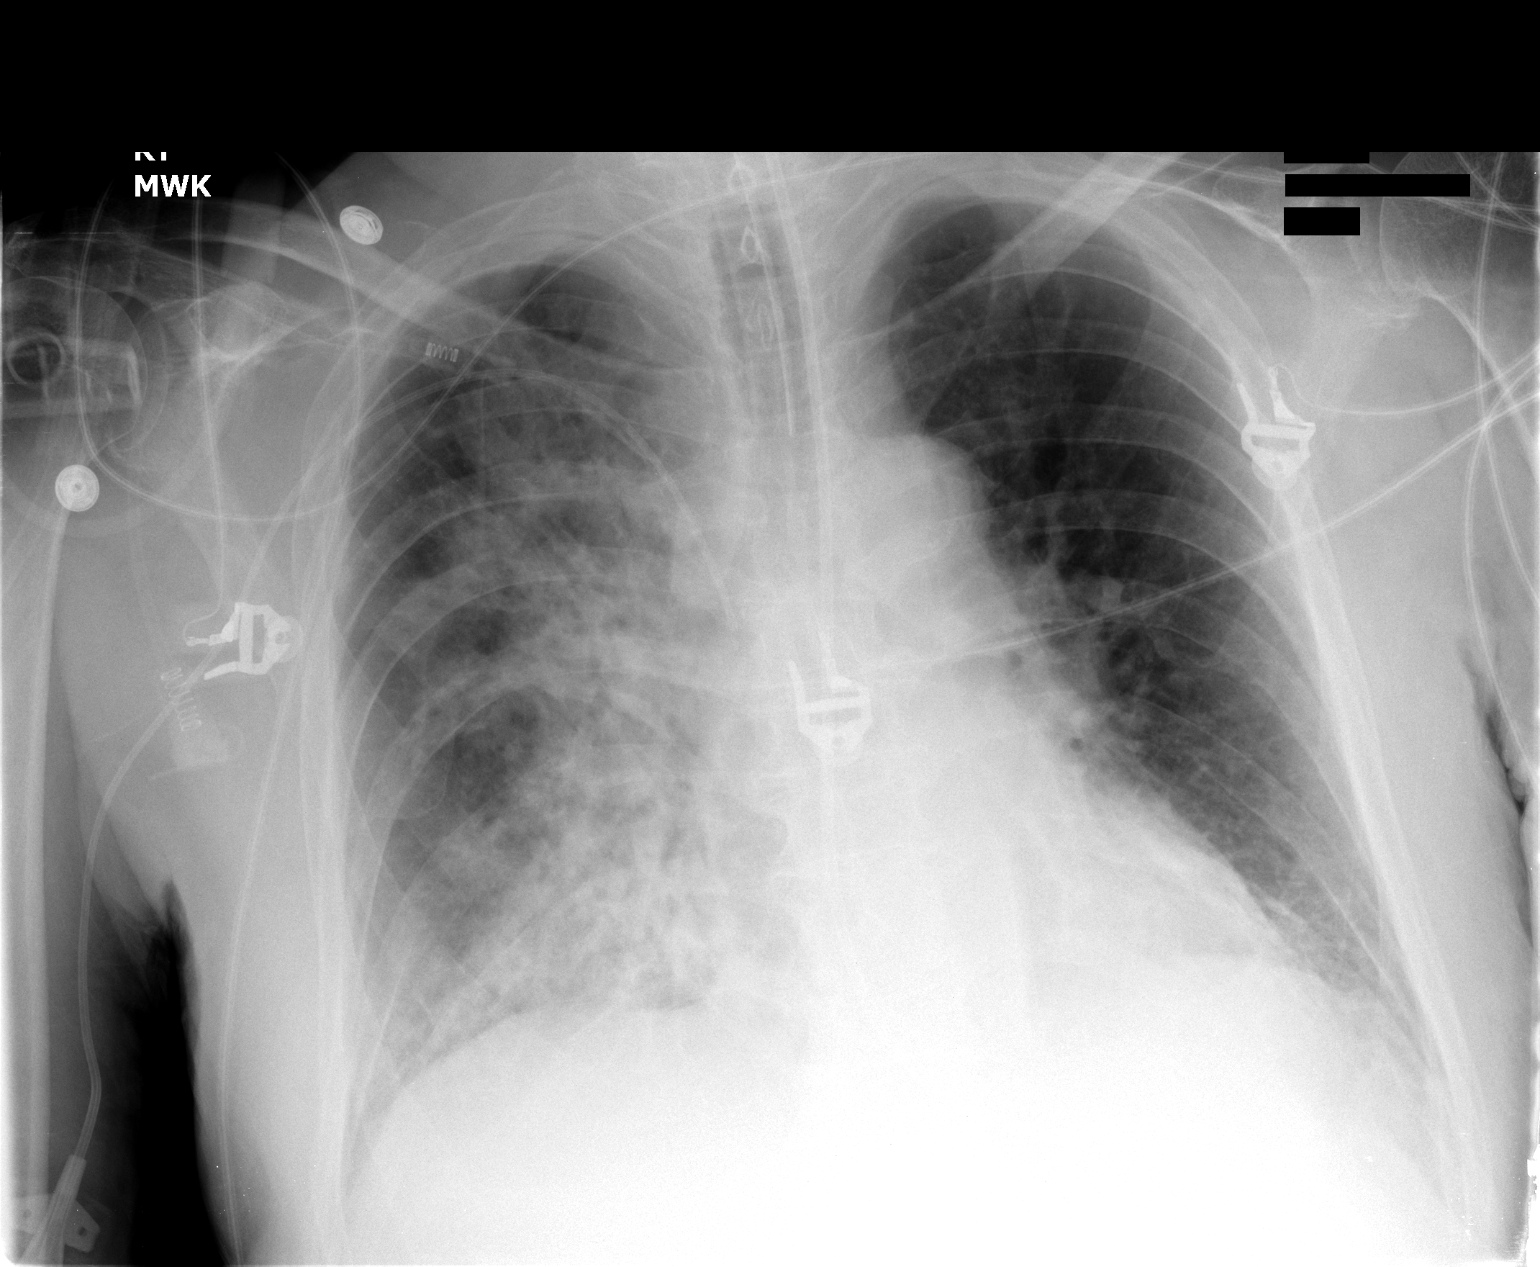

[1 of 1 positions shown; findings below may reference images not displayed]

FINDINGS: Endotracheal tube has been inserted and the tip is 4.2 cm above the carina.  Right arm PICC line tip is in good position in the mid superior vena cava.  Feeding tube tip is below the diaphragm.  The patient has an extensive infiltrate in the right lung, slightly progressed.  There is now some slight atelectasis at the left base.  Heart size and vascularity are normal.
IMPRESSION: Endotracheal tube in good position.  Slight progression of extensive right pneumonia.

## 2009-03-31 IMAGING — CR DG CHEST 1V PORT
1 series · 1 of 1 positions shown · non-contrast
Comparison: 8188 hours the same day.

CLINICAL DATA: 71-year-old male with PICC line placement.  
 PORTABLE CHEST - 1 VIEW:

[AP]
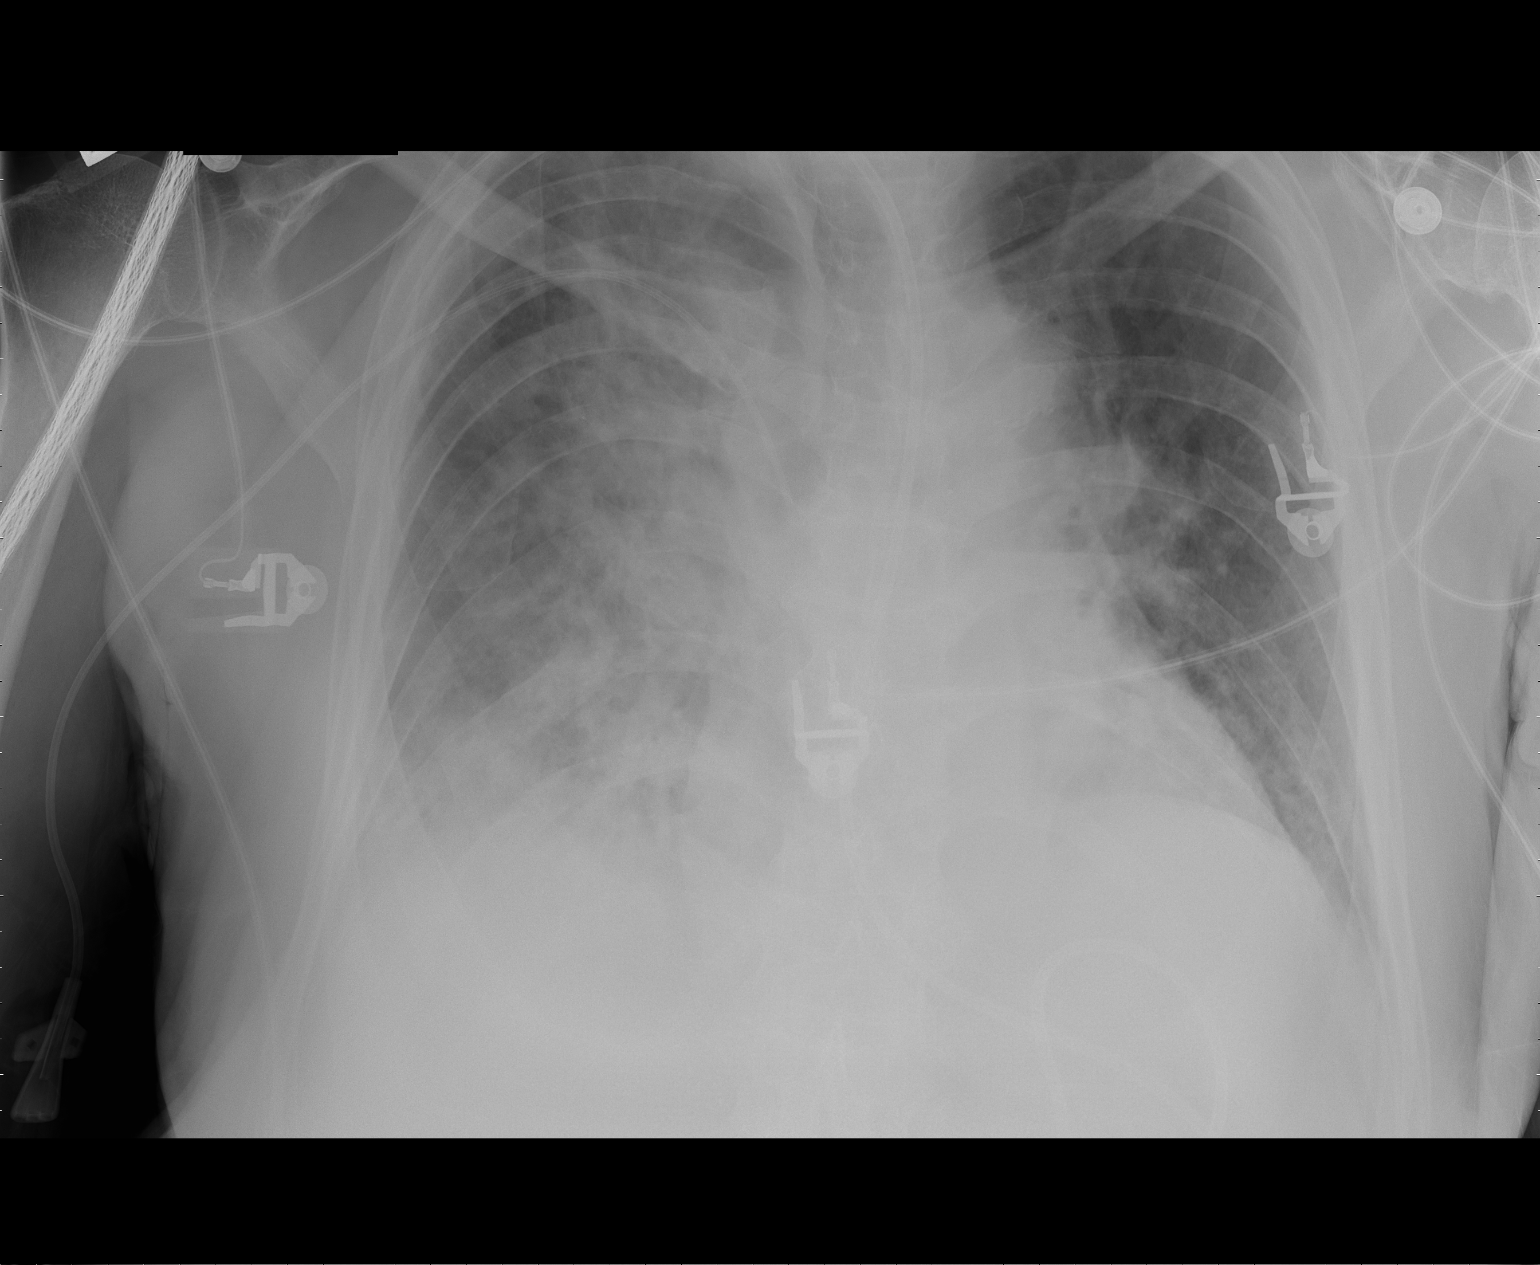

[1 of 1 positions shown; findings below may reference images not displayed]

FINDINGS: Portable semierect AP view at 4647 hours.   Enteric tube courses to the left upper quadrant and appears looped in the region of the stomach.  A right-sided PICC line catheter is in place and appears unchanged from the prior exam.  The tip projects at the level of the upper SVC.  No interval change in diffuse right lung airspace opacity compatible with multilobar pneumonia.  Stable cardiac size and mediastinal contour.
IMPRESSION: 1.  Stable right PICC line, tip at the upper SVC level.  
 2.  Stable right multilobar pneumonia.

## 2009-03-31 IMAGING — CR DG CHEST 1V PORT
1 series · 1 of 1 positions shown · non-contrast
Comparison: none

CLINICAL DATA: Followup lung disease. History of femoral head AVN. 
 PORTABLE CHEST 05/18/2007:

[view not recorded]
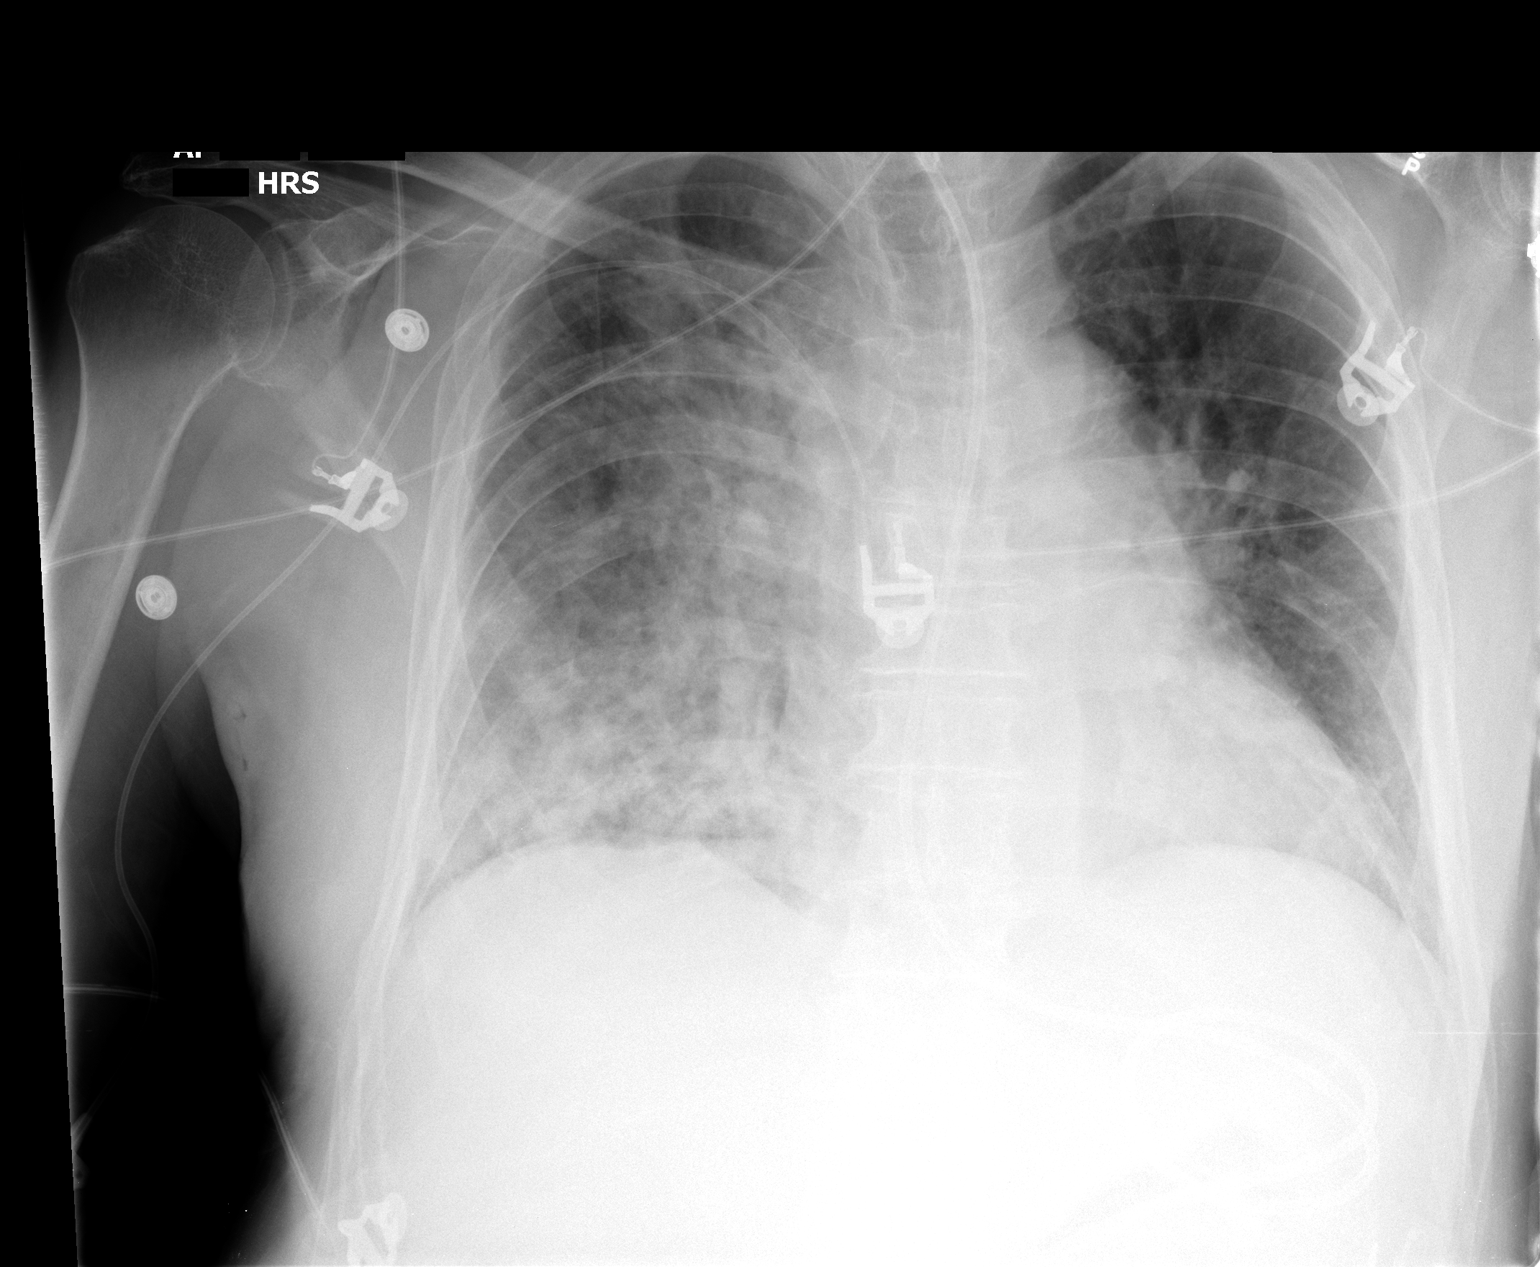

[1 of 1 positions shown; findings below may reference images not displayed]

FINDINGS: Portable view of the chest again demonstrates diffuse airspace disease throughout the right lung.  Right PICC line overlies the proximal SVC region.  Feeding tube is followed into the stomach.  Mild interstitial densities throughout the left lung are unchanged.  Heart size is stable.
IMPRESSION: Minimal change in the chest radiograph with persistent right lung airspace disease.  Findings consistent with infection or aspiration.

## 2009-04-01 IMAGING — CR DG CHEST 1V PORT
1 series · 1 of 1 positions shown · non-contrast
Comparison: 05/18/07.

CLINICAL DATA: Respiratory failure/on ventilator. 
 PORTABLE CHEST - 1 VIEW ? 05/19/07:

[AP]
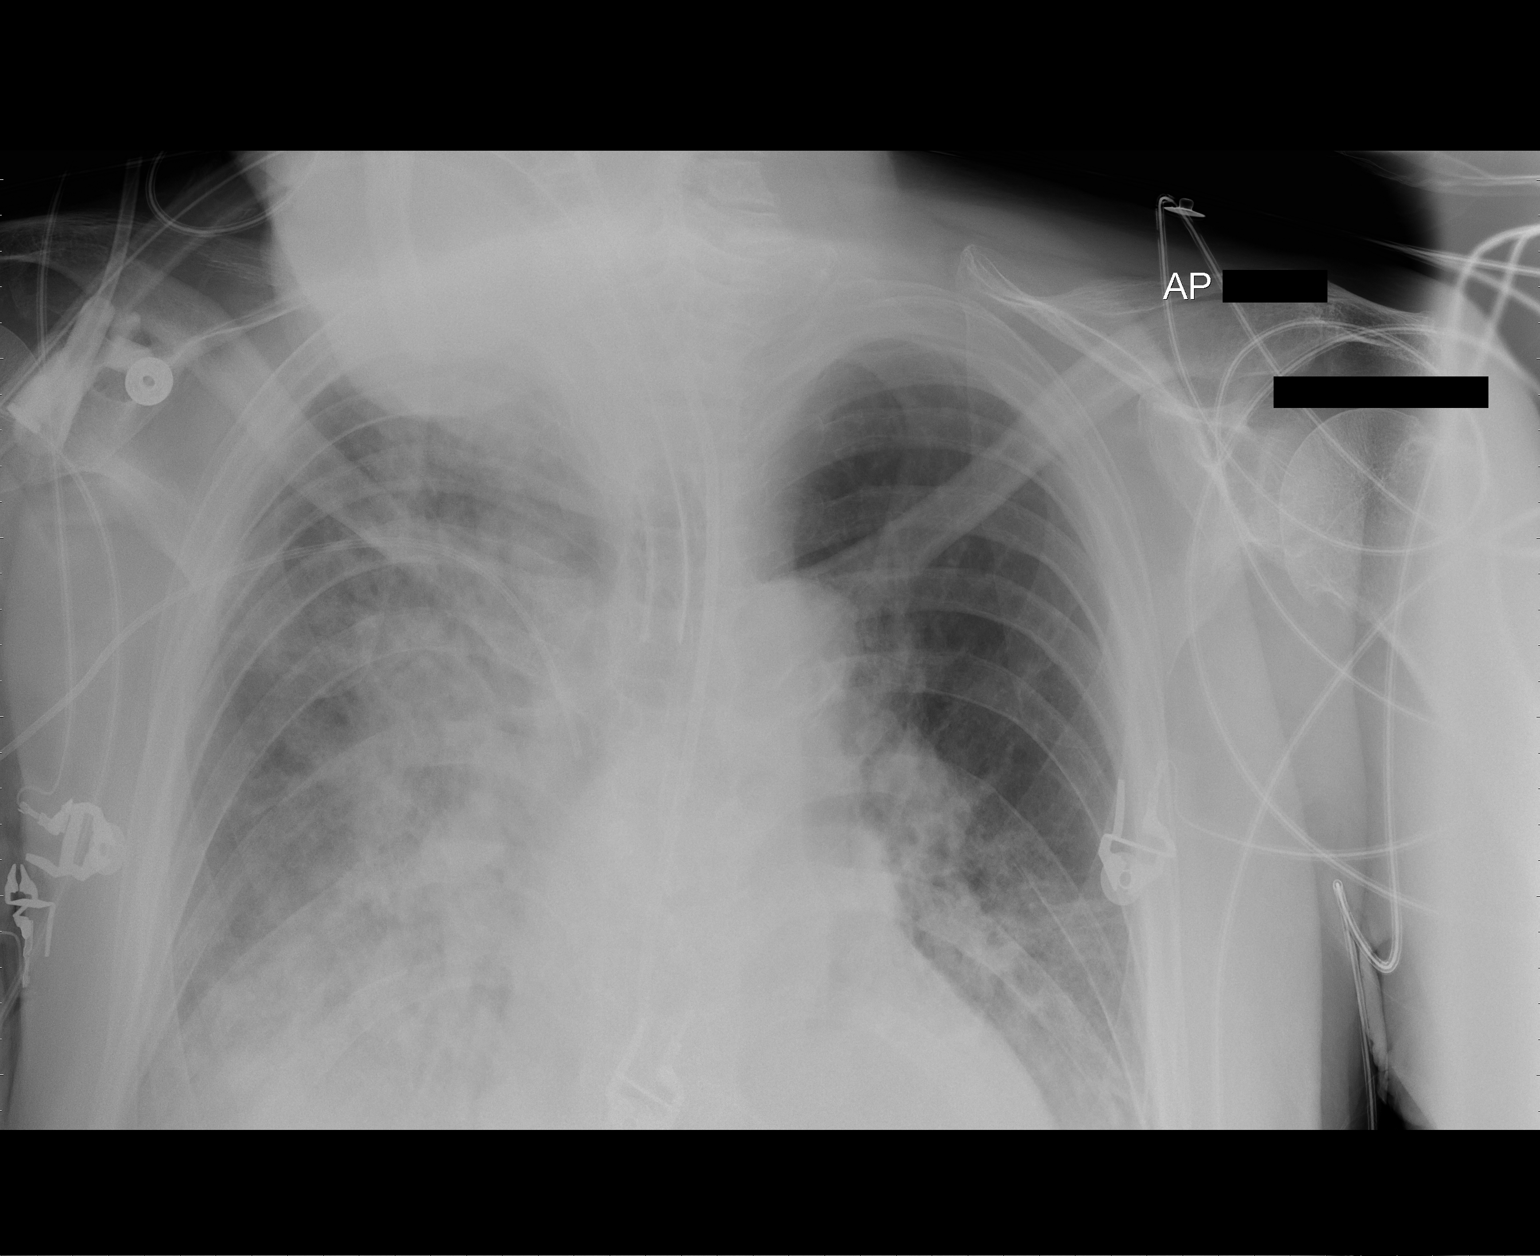

[1 of 1 positions shown; findings below may reference images not displayed]

FINDINGS: Bilateral airspace disease without definite change allowing for differences in technique.  Heart within normal limits.  Support apparatus unchanged.
IMPRESSION: Bilateral airspace disease ? no definite change allowing for differences in technique.

## 2009-04-02 IMAGING — CR DG CHEST 1V PORT
1 series · 1 of 1 positions shown · non-contrast
Comparison: One day prior

CLINICAL DATA: Retained rod. AVN.

CHEST - 1 VIEW

[view not recorded]
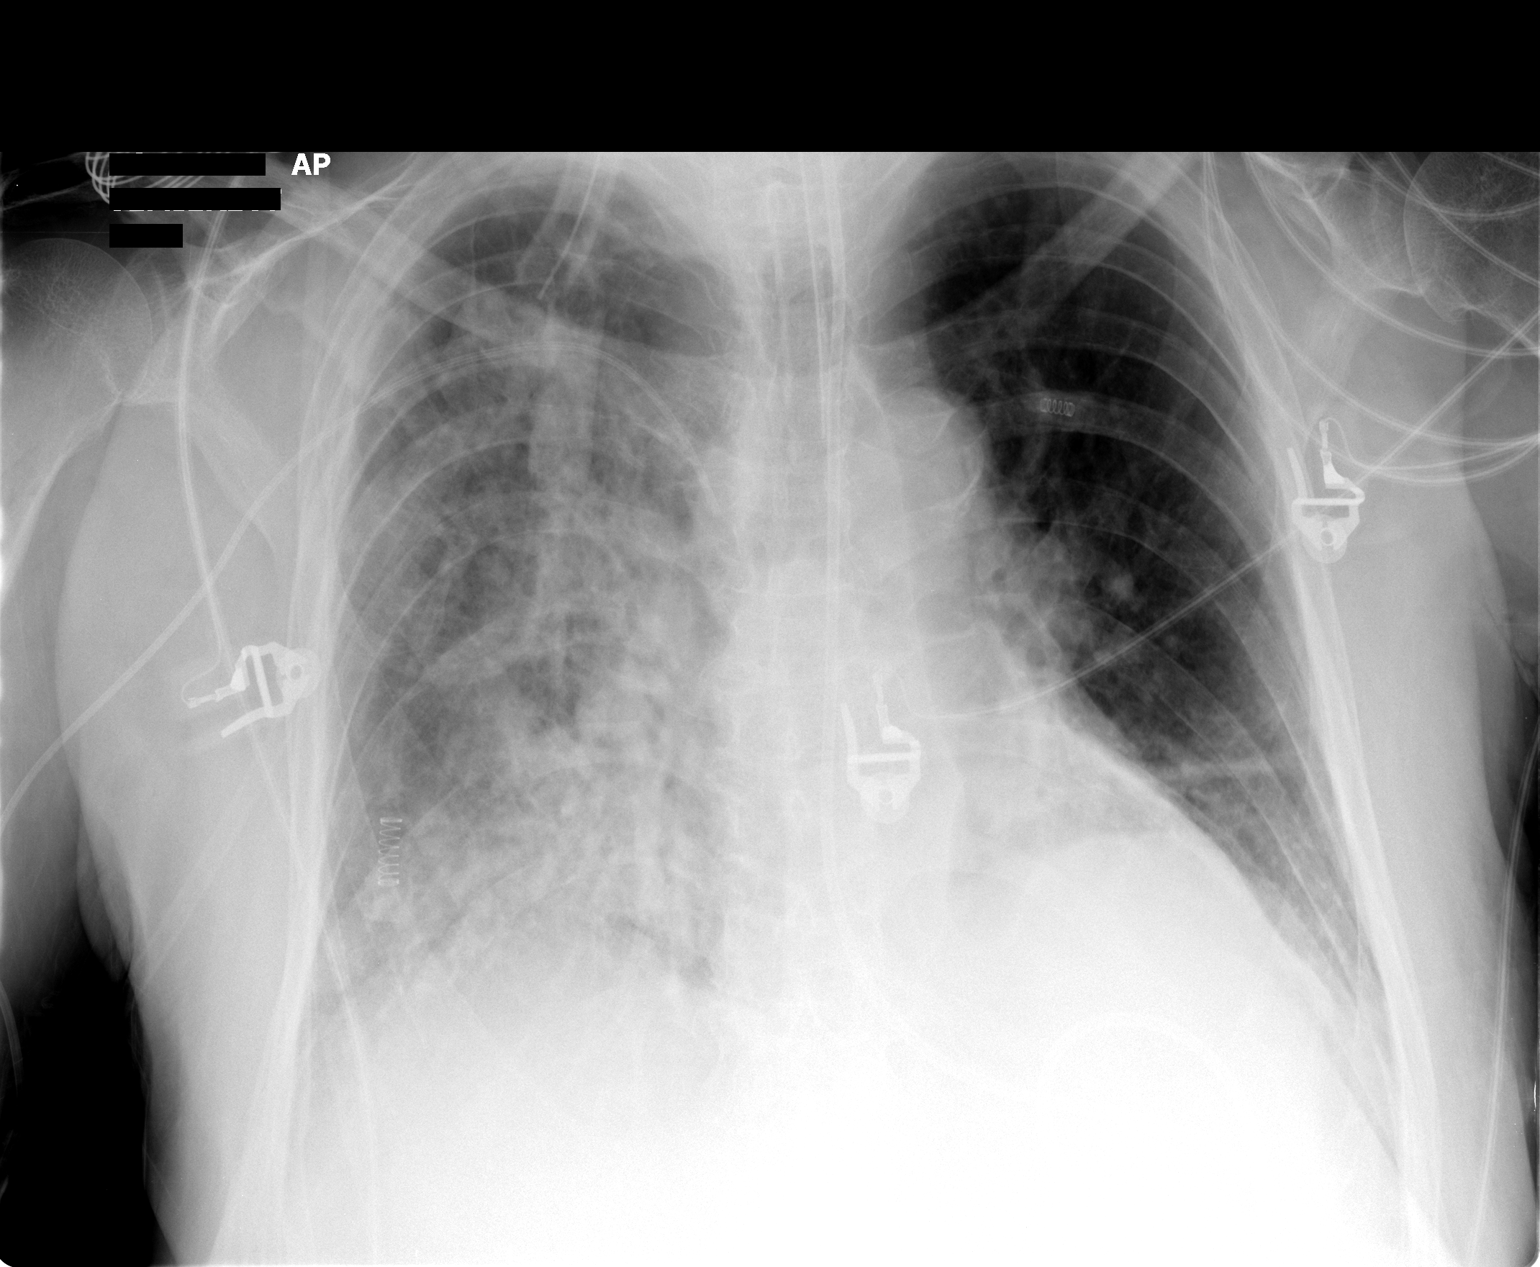

[1 of 1 positions shown; findings below may reference images not displayed]

FINDINGS: Endotracheal tube unchanged. Feeding tube looped in stomach and
extends beyond the inferior aspect of film. right-sided PICC line unchanged.

Normal heart size. Probable layering right pleural effusion. No pneumothorax.
Minimal improvement in aeration with primarily right-sided airspace disease
remaining. Minimal left base atelectasis.

IMPRESSION

1. Slightly improved aeration with primarily right-sided infection versus
asymmetric pulmonary edema remaining.

## 2009-04-03 IMAGING — CR DG CHEST 1V PORT
1 series · 1 of 1 positions shown · non-contrast
Comparison: One day prior

CLINICAL DATA: AVN.

CHEST - 1 VIEW

[view not recorded]
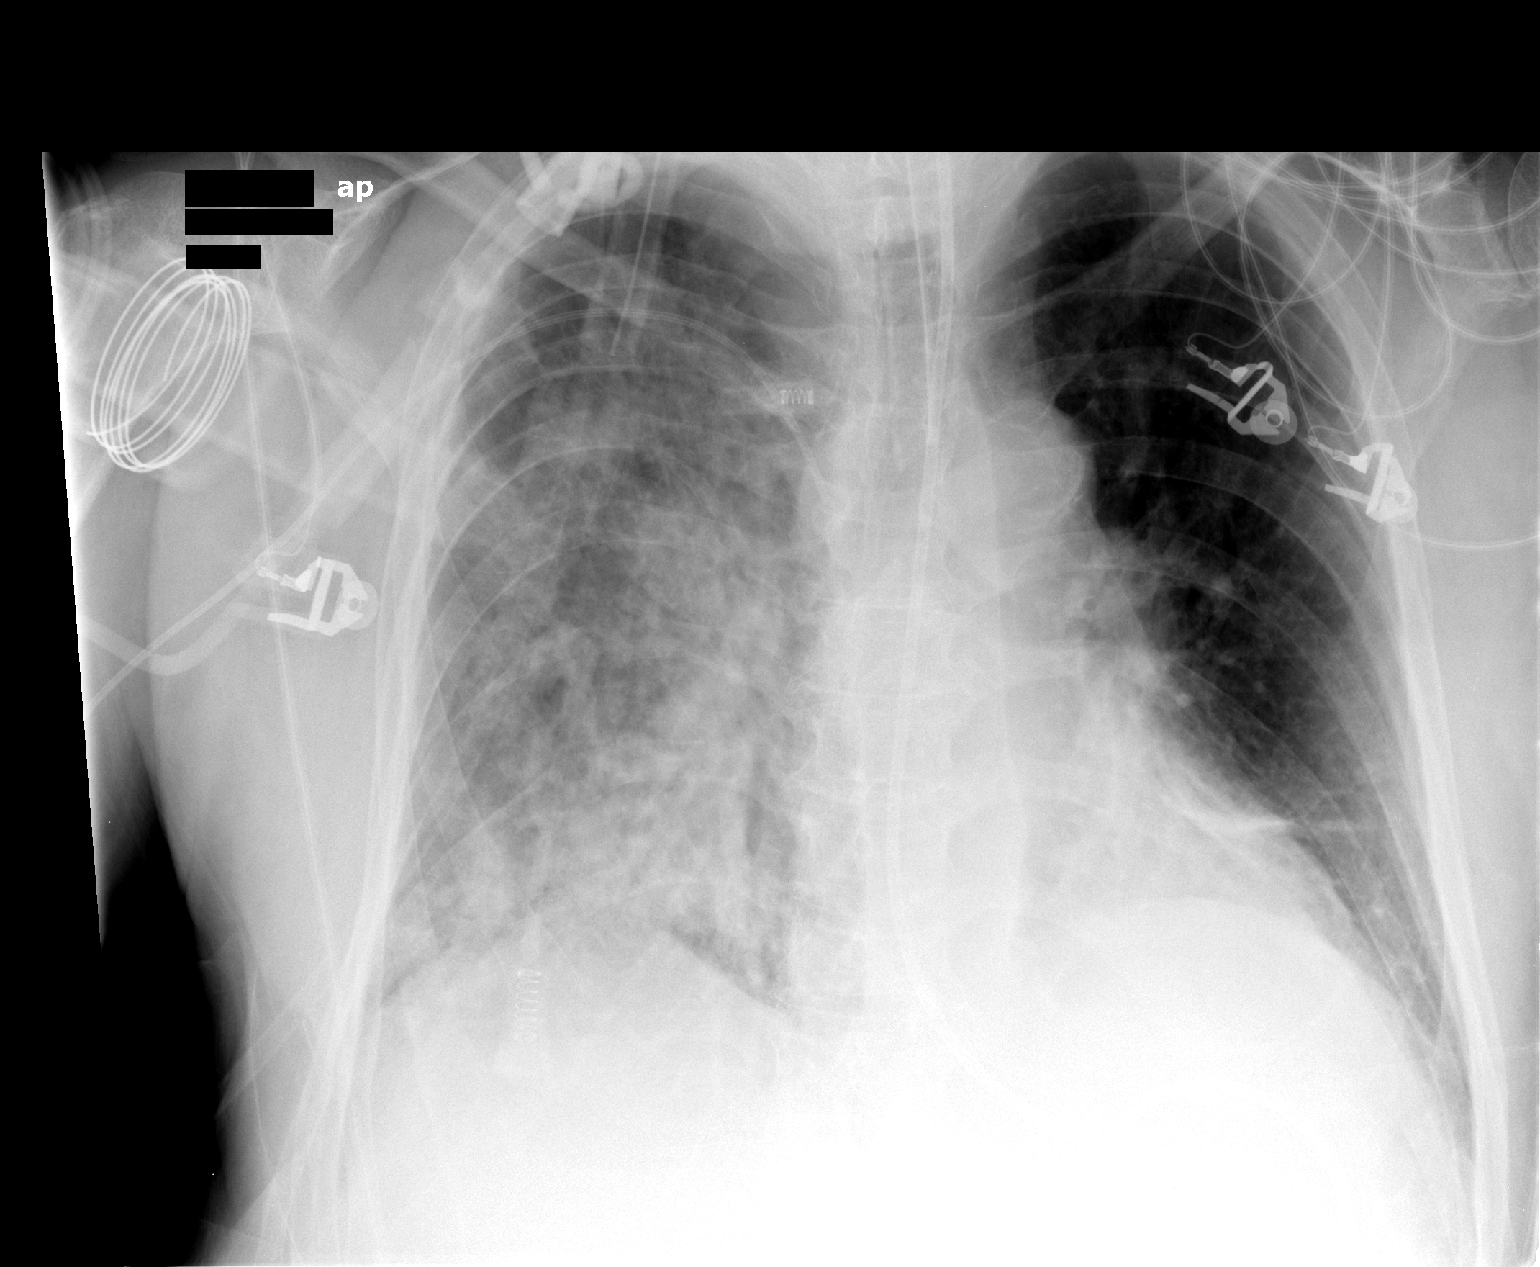

[1 of 1 positions shown; findings below may reference images not displayed]

FINDINGS: Endotracheal tube remains borderline low at approximately 2.8 cm
above the carina. Feeding tube looped in the stomach. Mild motion limited exam.
Normal heart size. No pneumothorax. Possible small layering right pleural
effusion. No significant change in right-sided diffuse airspace disease. Minimal
left base atelectasis remains.

IMPRESSION

1. Similar right-sided air space disease, likely infection/aspiration.
Asymmetric pulmonary edema could look similar.
2. Endotracheal tube borderline low in position. Consider retraction 1-2 cm.

## 2009-04-05 IMAGING — CR DG ABD PORTABLE 1V
1 series · 1 of 1 positions shown · non-contrast
Comparison: none

CLINICAL DATA: Postop from left hip replacement for avascular necrosis. Feeding tube placement. 
 ABDOMEN PORTABLE - 1 VIEW:

[view not recorded]
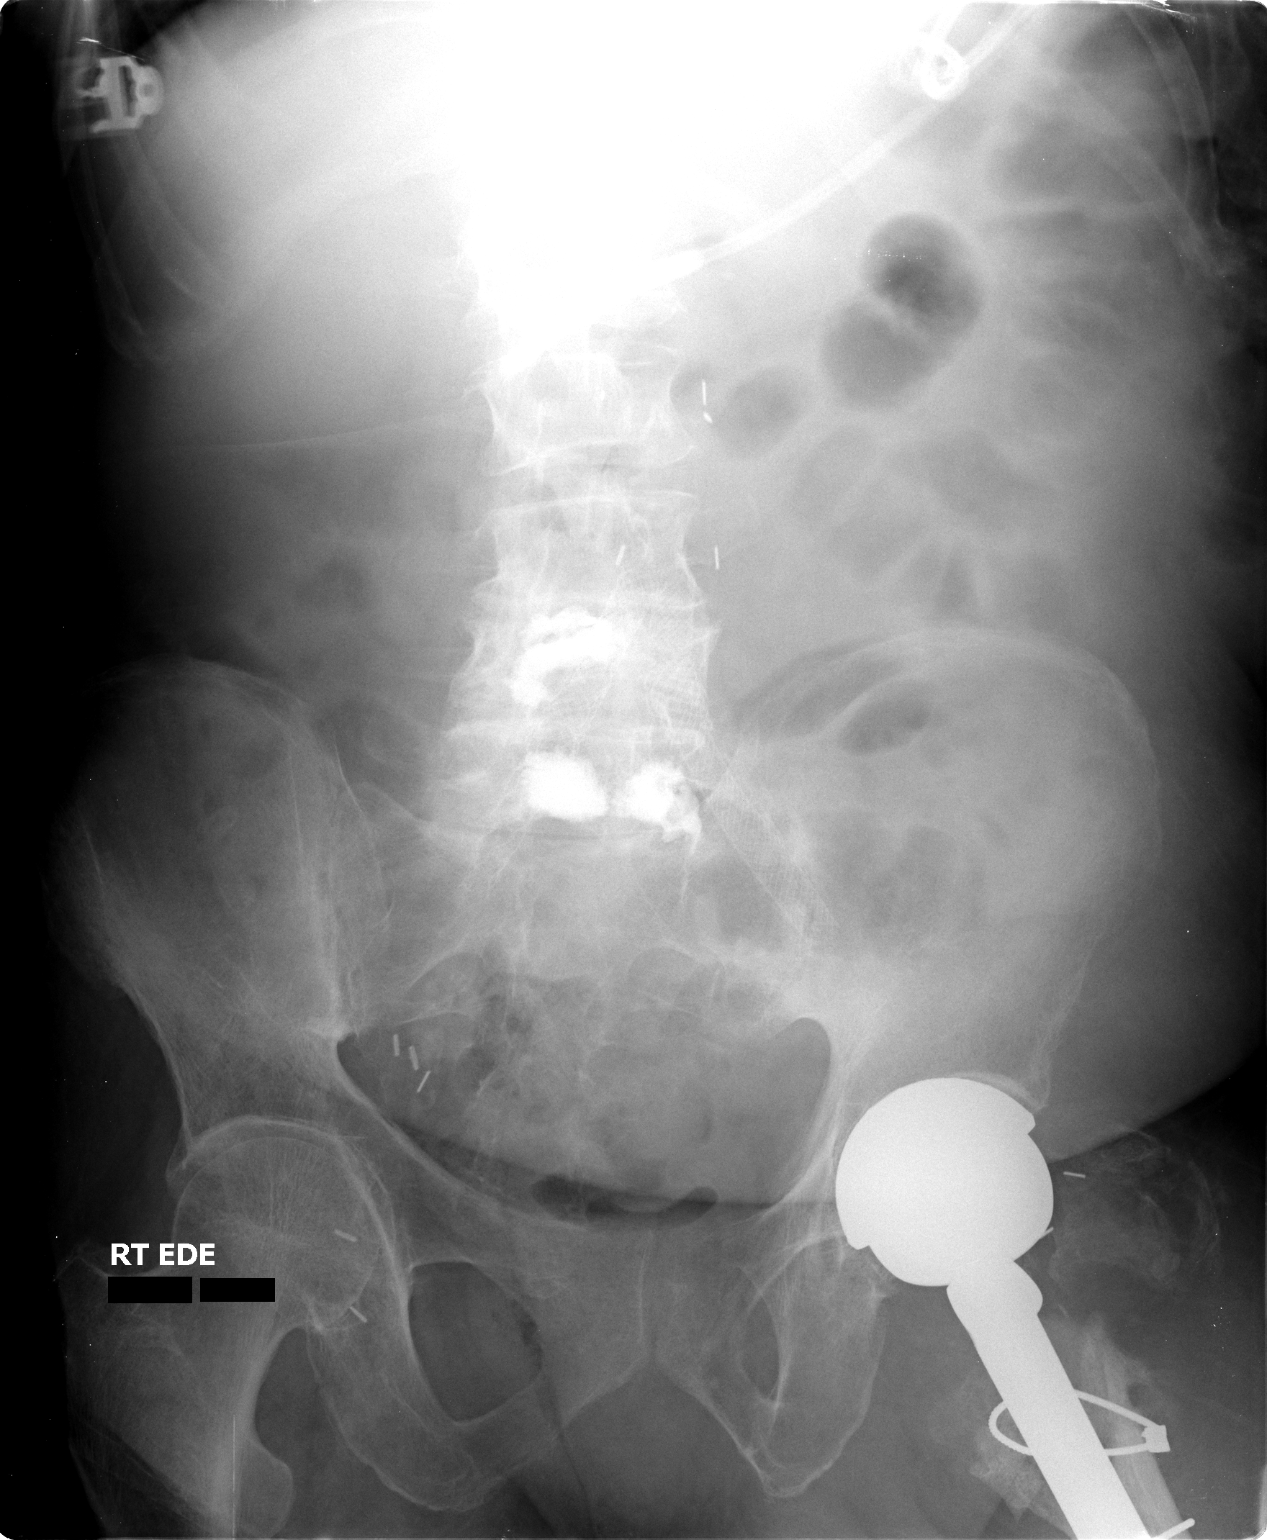

[1 of 1 positions shown; findings below may reference images not displayed]

FINDINGS: Panda feeding tube is seen within the stomach with the tip in the distal antrum.  There is no evidence of dilated bowel loops.
IMPRESSION: Panda feeding tube tip in distal gastric antrum.

## 2009-04-09 IMAGING — CR DG CHEST 1V PORT
1 series · 1 of 1 positions shown · non-contrast
Comparison: none

CLINICAL DATA: Respiratory distress.
PORTABLE CHEST - 1 VIEW - 05/27/07: 
Comparison Study:  05/23/07.

[view not recorded]
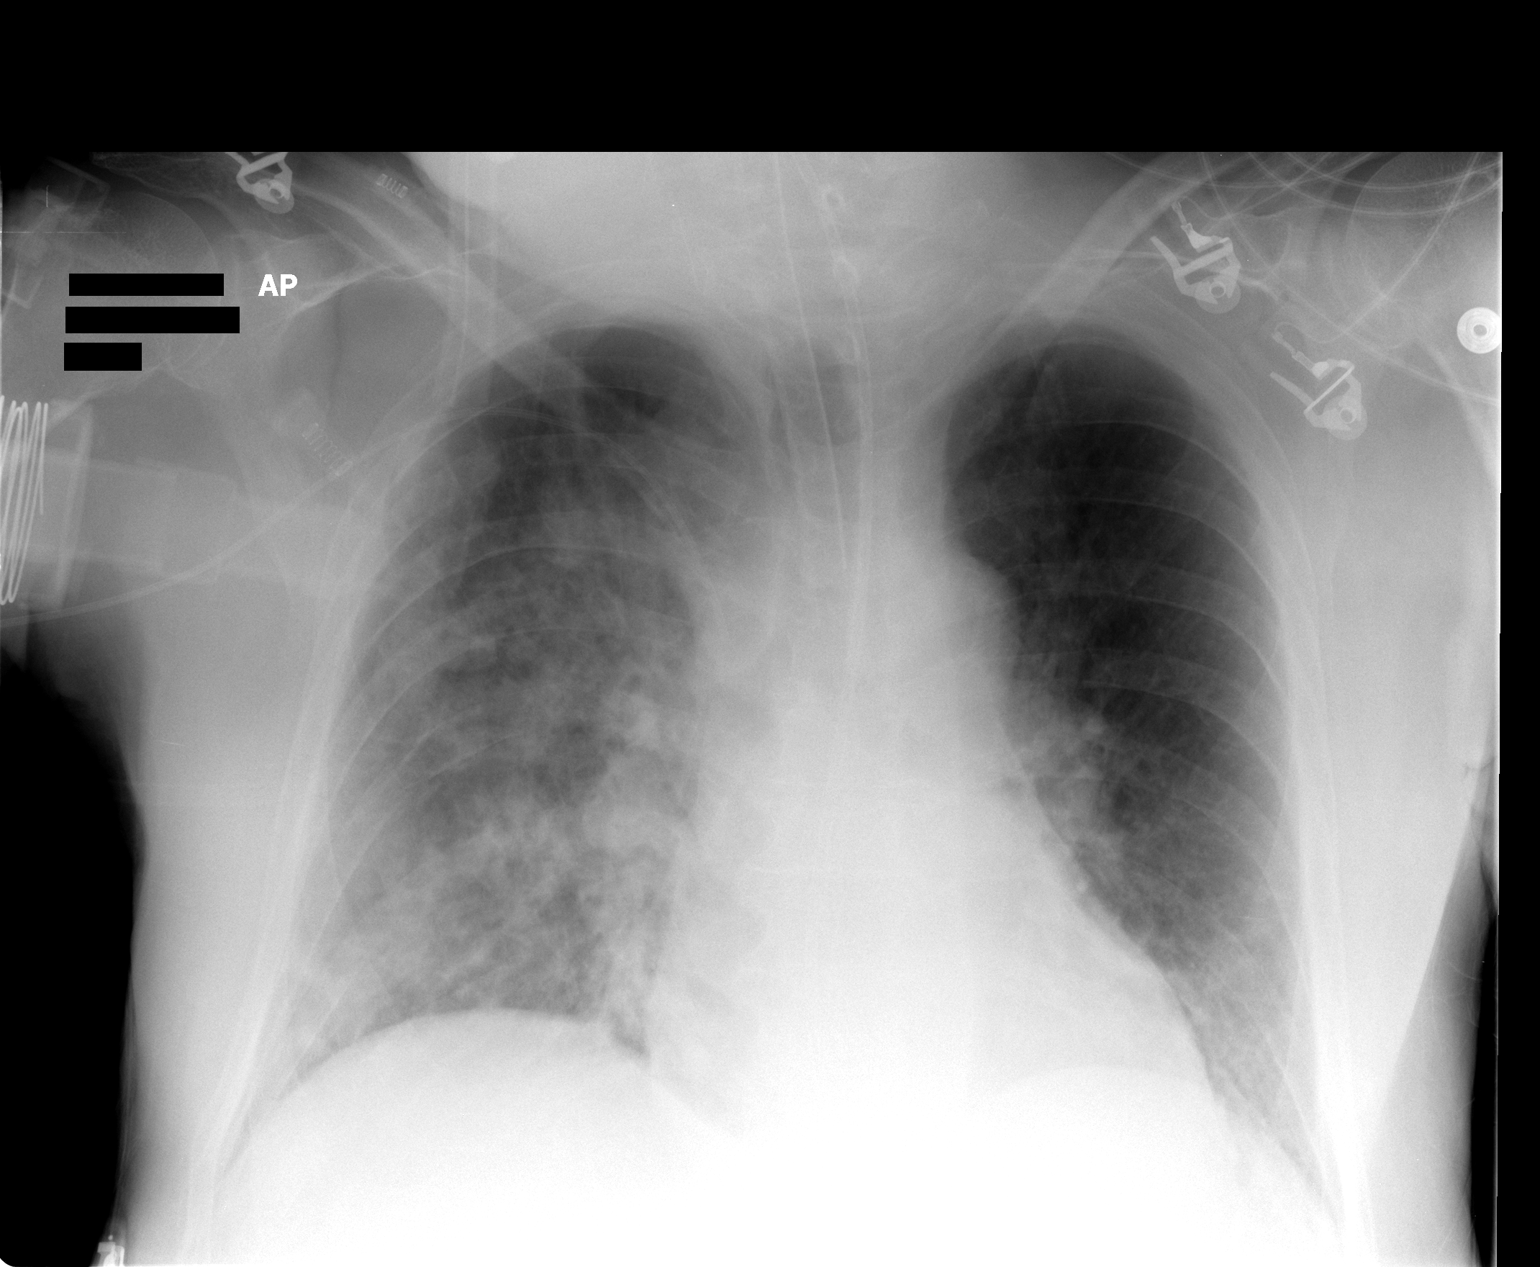

[1 of 1 positions shown; findings below may reference images not displayed]

FINDINGS: The endotracheal tube is within the thoracic inlet.  The PICC catheter is in the SVC.  Air space process in the right lung is redemonstrated with perhaps partial clearing.  The heart is stable.
IMPRESSION: Partial clearing of air space process  right lung.

## 2009-04-11 IMAGING — CR DG CHEST 1V PORT
1 series · 1 of 1 positions shown · non-contrast
Comparison: Chest, 05/28/07.

CLINICAL DATA: AVN left femoral head.
 PORTABLE CHEST - 1 VIEW:

[view not recorded]
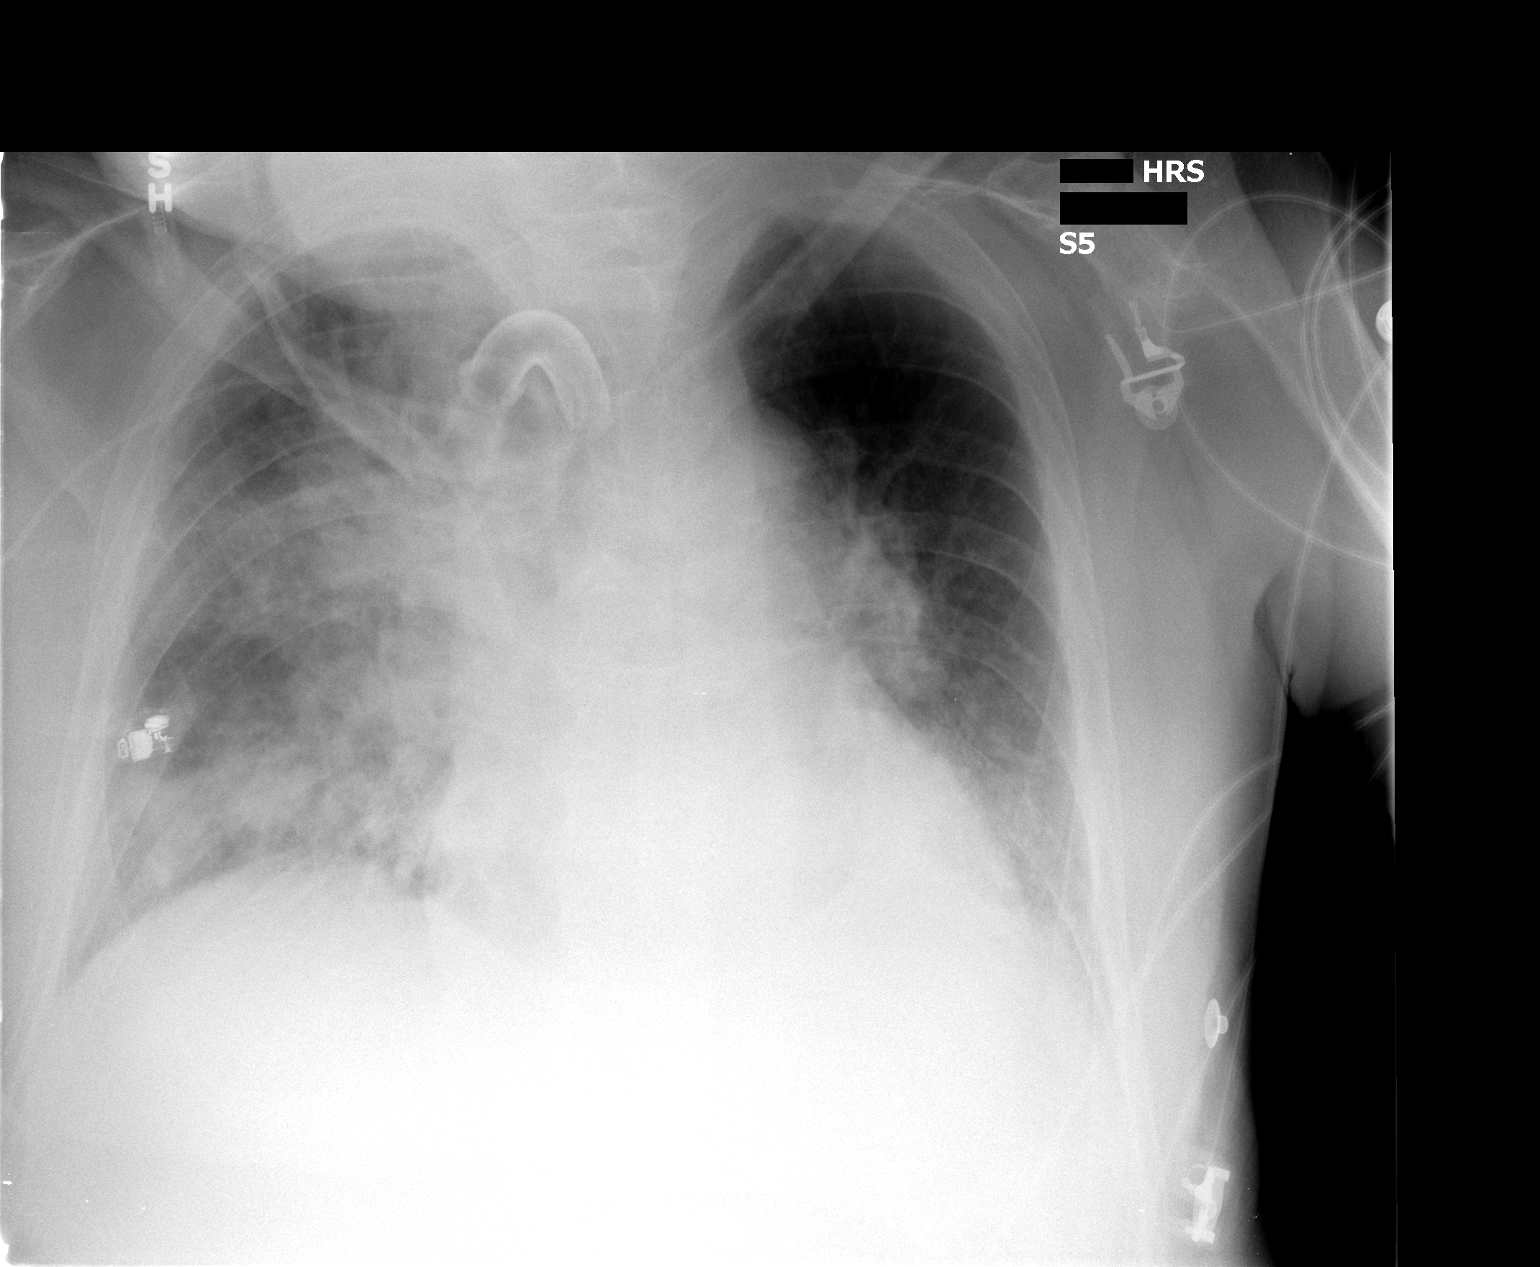

[1 of 1 positions shown; findings below may reference images not displayed]

FINDINGS: Interval replacement of endotracheal tube with tracheostomy tube, which is well positioned.  No evidence of pneumothorax.  Stable mildly enlarged cardiac silhouette.  There is a patch perihilar right airspace disease similar to prior. Low lung volumes.
IMPRESSION: 1.  Interval exchange of endotracheal tube for tracheostomy tube.
 2.  Right perihilar airspace disease not significantly changed.

## 2009-04-17 IMAGING — CR DG CHEST 1V PORT
1 series · 1 of 1 positions shown · non-contrast
Comparison: 05/29/07.

CLINICAL DATA: Pneumonia.  
 PORTABLE CHEST - 1 VIEW ? 06/04/07:

[view not recorded]
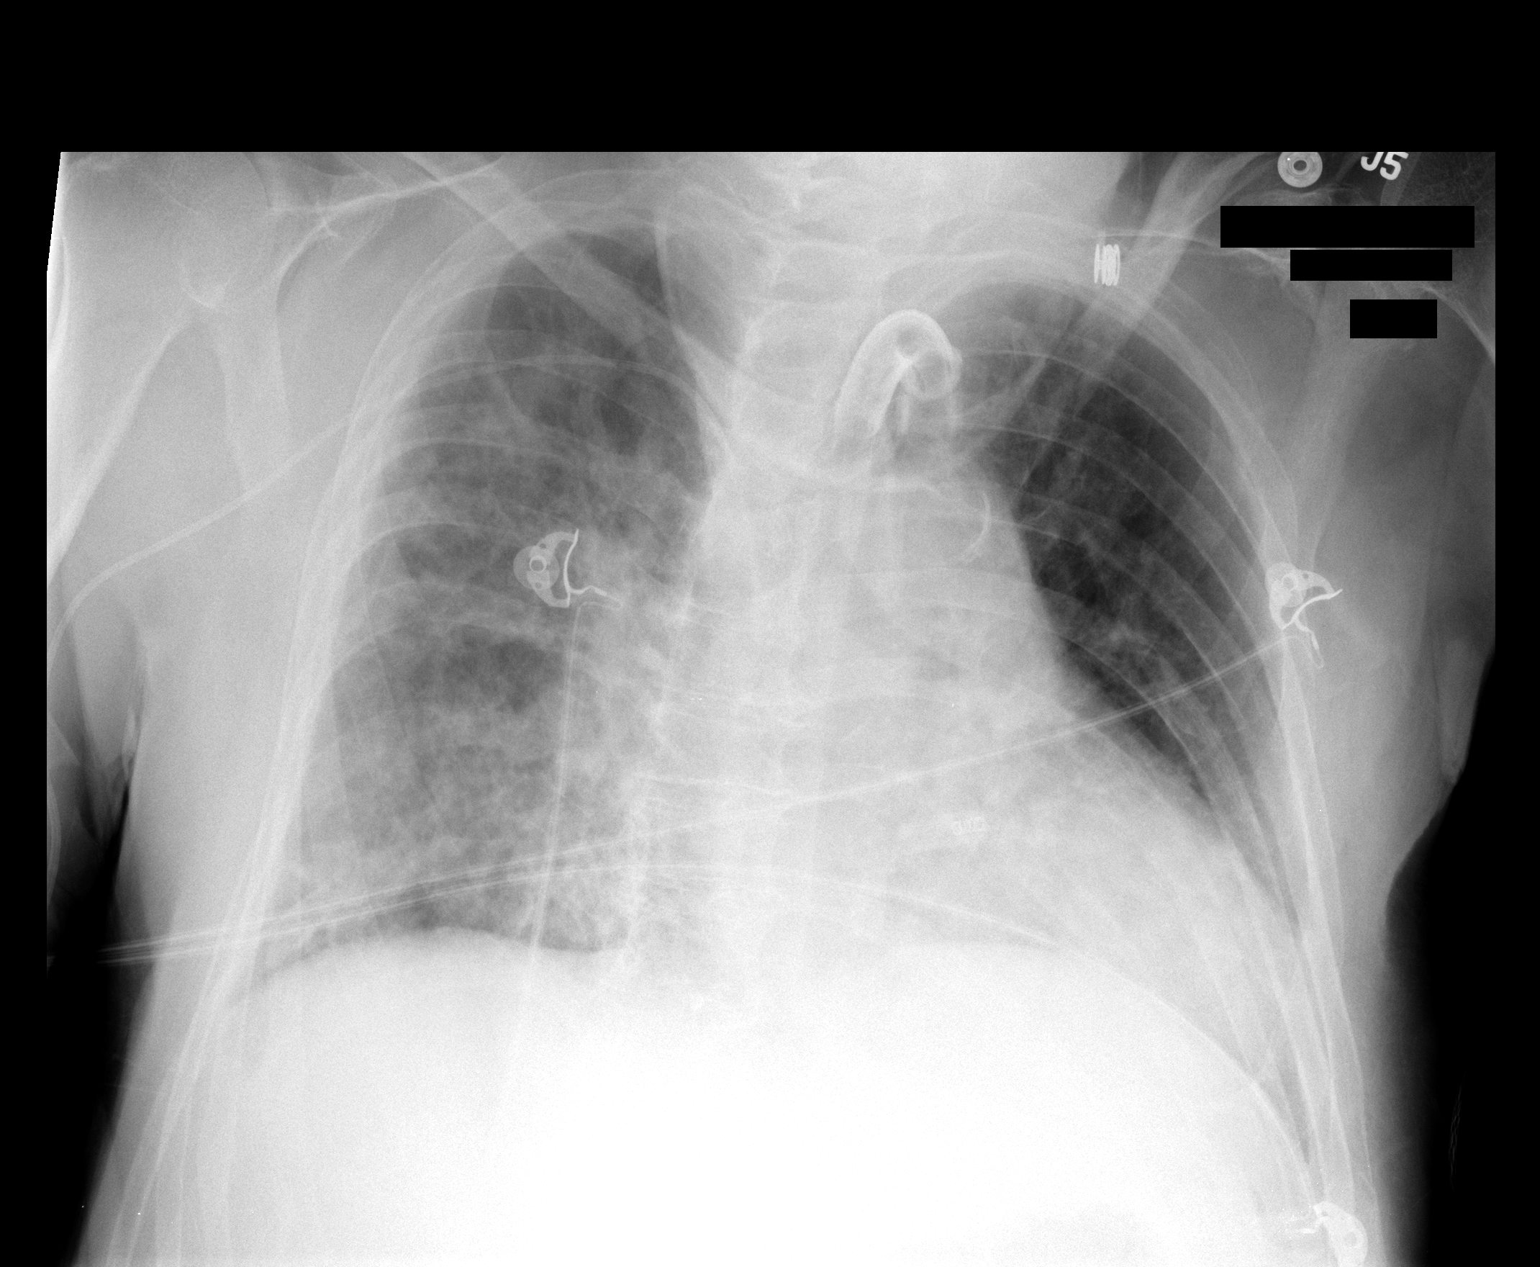

[1 of 1 positions shown; findings below may reference images not displayed]

FINDINGS: There has been significant clearing of the right lung infiltrates, although moderate infiltrates still persist.  Right PICC line tip is in good position in the superior vena cava.  Tracheostomy tube is unchanged.  The left lung is clear.
IMPRESSION: Significant improvement in the right pneumonia.

## 2009-04-25 IMAGING — CR DG CHEST 1V
1 series · 1 of 1 positions shown · non-contrast
Comparison: none

CLINICAL DATA: Fever.
 CHEST - 1 VIEW:

[view not recorded]
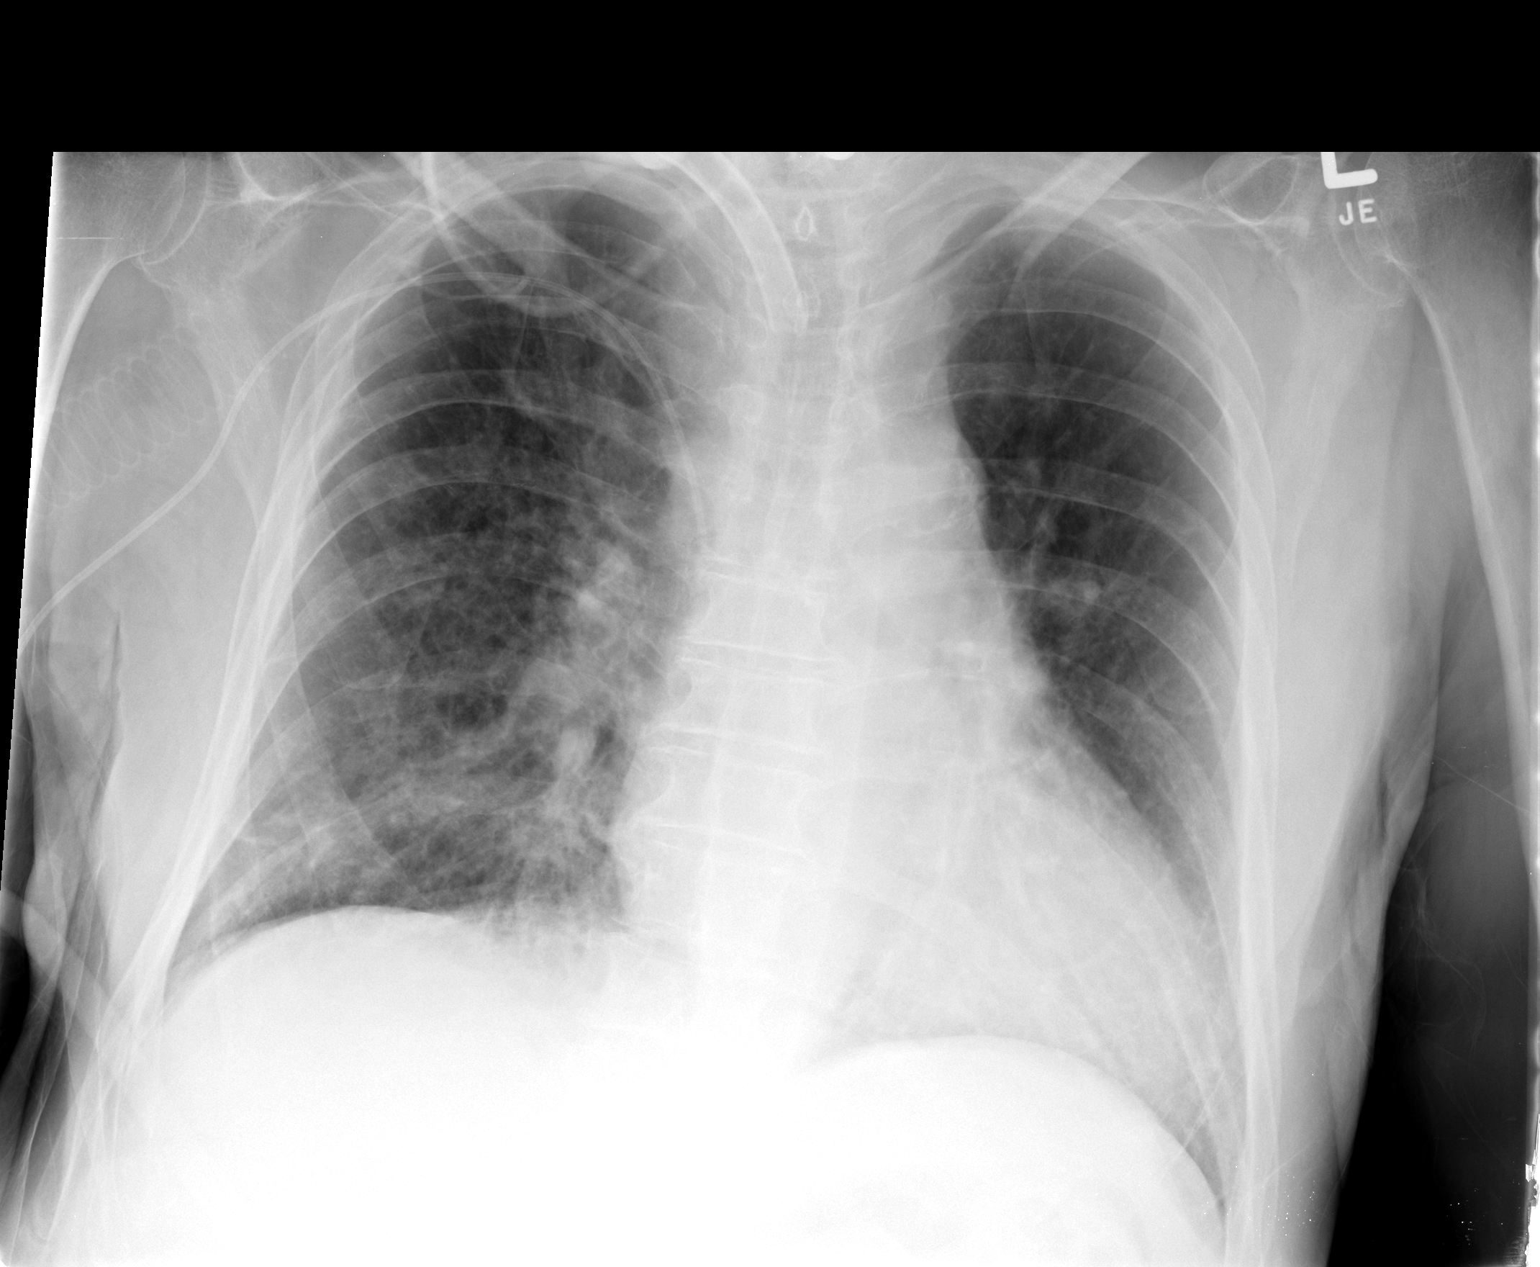

[1 of 1 positions shown; findings below may reference images not displayed]

FINDINGS: Airspace disease throughout the right lung has improved. At the left base it has nearly resolve. No pneumothorax. The tracheostomy tube and right upper extremity PICC are stable. The heart remains borderline enlarged.
IMPRESSION: Improved pneumonia.

## 2009-04-27 IMAGING — CR DG CHEST 1V PORT
1 series · 1 of 1 positions shown · non-contrast
Comparison: 06/12/2007

CLINICAL DATA: Pneumonia. 
 PORTABLE CHEST [DATE]/[id number] HOURS:

[view not recorded]
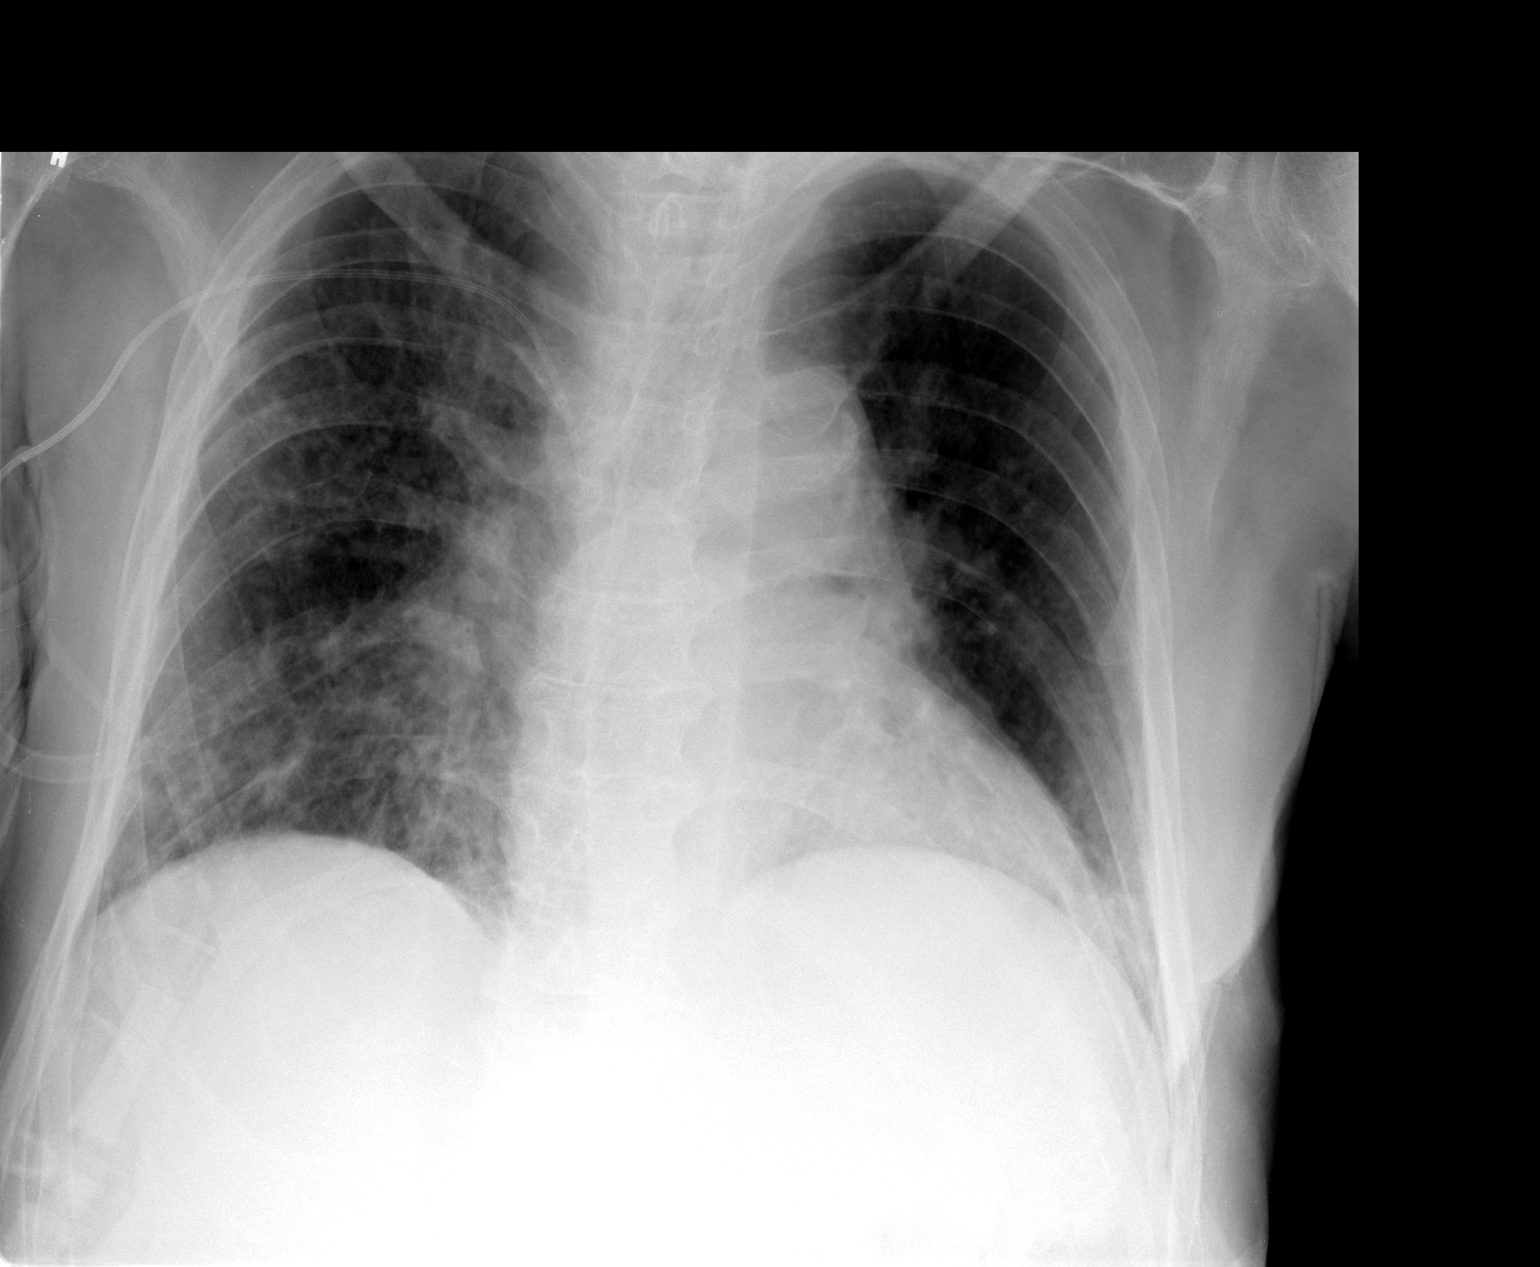

[1 of 1 positions shown; findings below may reference images not displayed]

FINDINGS: Tracheostomy has been removed.  PICC line in stable position. There is improving right lower lobe pneumonia. No edema or pleural fluid.
IMPRESSION: Improving right lower lobe pneumonia.

## 2009-05-27 ENCOUNTER — Emergency Department (HOSPITAL_COMMUNITY): Admission: EM | Admit: 2009-05-27 | Discharge: 2009-05-27 | Payer: Self-pay | Admitting: *Deleted

## 2009-05-27 ENCOUNTER — Inpatient Hospital Stay (HOSPITAL_COMMUNITY): Admission: EM | Admit: 2009-05-27 | Discharge: 2009-05-31 | Payer: Self-pay | Admitting: Emergency Medicine

## 2009-07-11 ENCOUNTER — Encounter: Admission: RE | Admit: 2009-07-11 | Discharge: 2009-07-11 | Payer: Self-pay | Admitting: Internal Medicine

## 2009-09-15 ENCOUNTER — Emergency Department (HOSPITAL_COMMUNITY): Admission: EM | Admit: 2009-09-15 | Discharge: 2009-09-15 | Payer: Self-pay | Admitting: Emergency Medicine

## 2010-01-03 IMAGING — CR DG ABDOMEN ACUTE W/ 1V CHEST
1 series · 1 of 1 positions shown · non-contrast
Comparison: Abdominal radiographs 05/23/2007 and chest radiographs
06/14/2007.

CLINICAL DATA: Nausea and vomiting for 2 days.  Low abdominal pain.

ACUTE ABDOMEN SERIES (ABDOMEN 2 VIEW & CHEST 1 VIEW)

[view not recorded]
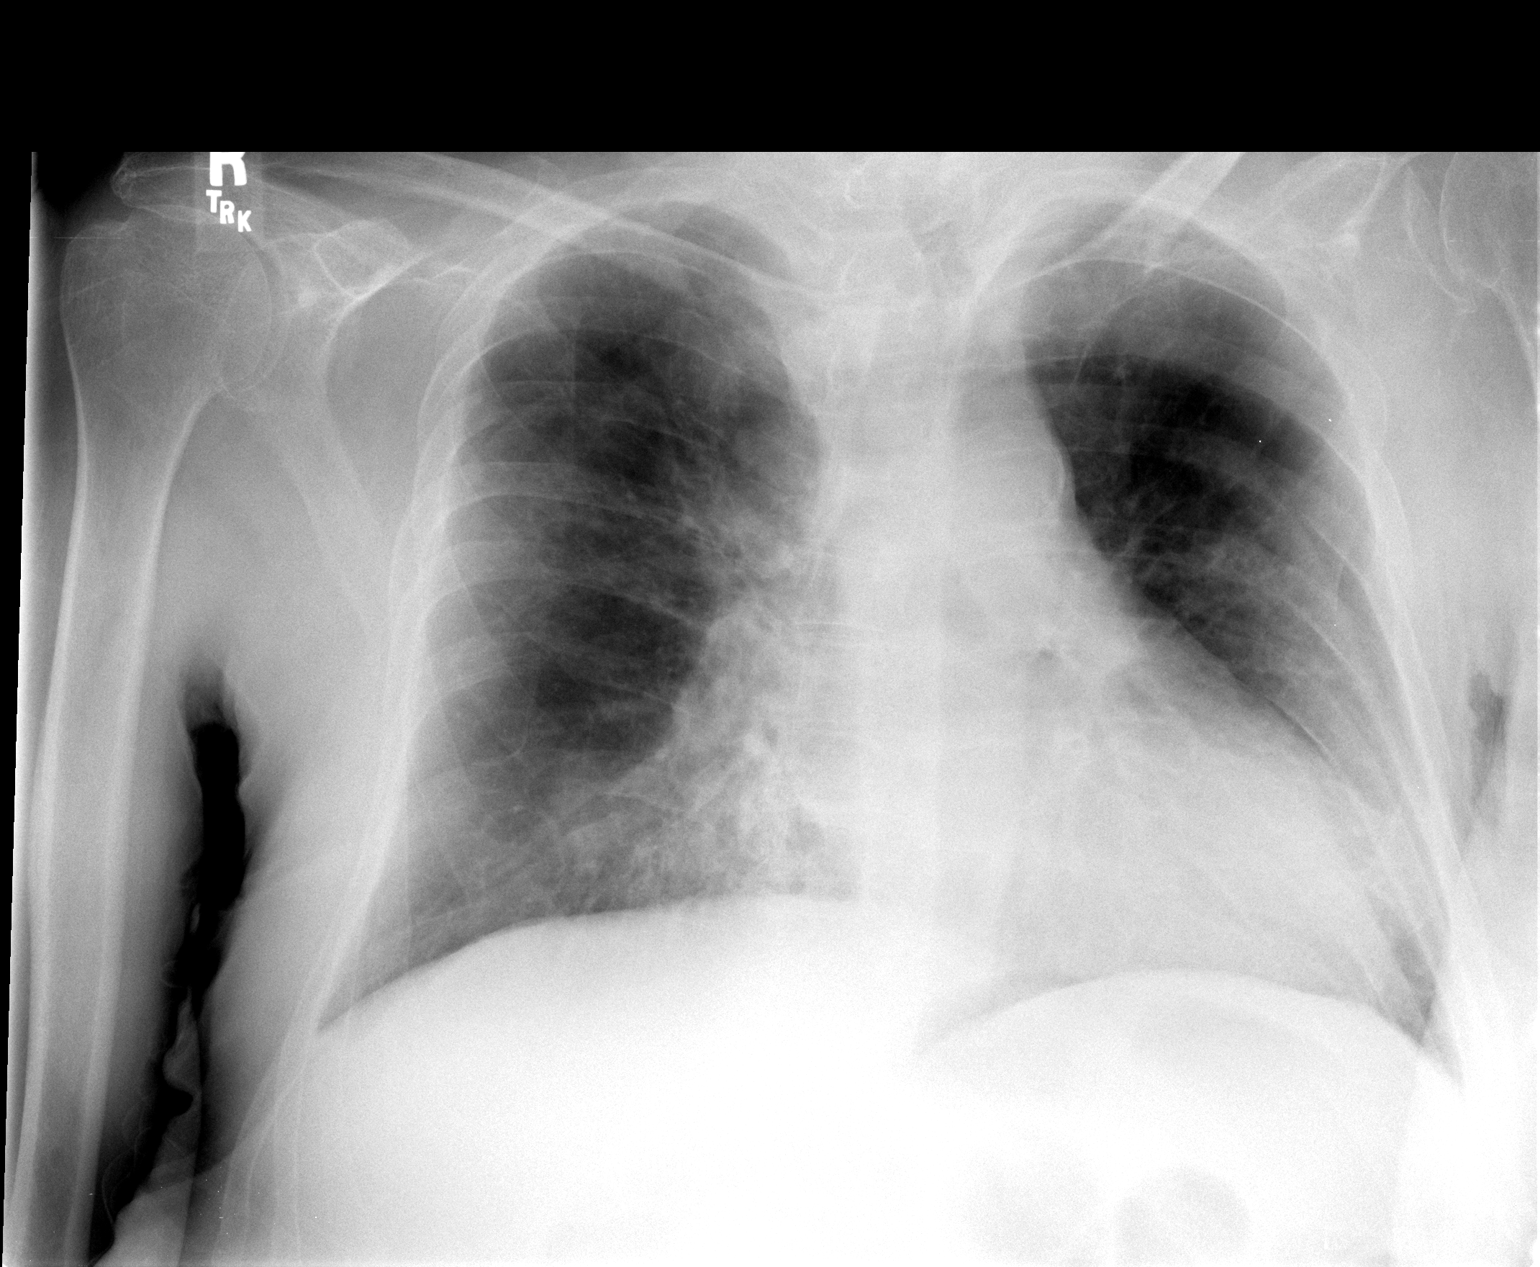

[1 of 1 positions shown; findings below may reference images not displayed]

FINDINGS: Frontal examination of the chest demonstrates stable
cardiomegaly.  The interstitial markings are prominent, although
compared with prior examination, the overall pulmonary aeration has
improved.  No focal airspace disease is demonstrated currently.
There is no pleural effusion.

Supine and decubitus views of the abdomen demonstrate new moderate
small bowel distension with multiple air-fluid levels on the erect
examination.  The colon appears relatively decompressed.  There is
a large amount of stool in the rectum.  Iliac stents, post
vertebroplasty changes at L4-L5, diffuse osteopenia and previous
left hip arthroplasty are noted.  There is no evidence of free
intraperitoneal air.
IMPRESSION: 1.  Small bowel obstruction.  This could be further evaluated with
CT if clinically warranted.
2.  No acute chest findings.  Remaining pulmonary opacities are
probably chronic and appear improved compared with prior studies.

## 2010-01-03 IMAGING — CT CT PELVIS W/ CM
2 of 5 series · 16 of 46 positions shown, 18 images · IV contrast (agent unspecified)
Comparison: None

CT ABDOMEN

CLINICAL DATA: Abdominal pain

CT ABDOMEN AND PELVIS WITH CONTRAST
TECHNIQUE: Multidetector CT imaging of the abdomen and pelvis was
performed using the standard protocol following bolus
administration of intravenous contrast.
Contrast: 100 ml [id number]

[Series 2: routine abdomen · axial · 0.76mm/px · z∈[-408,+7]mm · 13 of 95 slices shown, 15 images]
[im 6/95  soft-tissue]
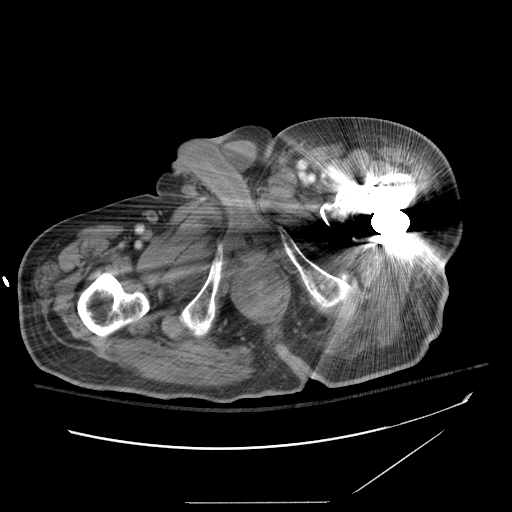
[im 6/95  bone]
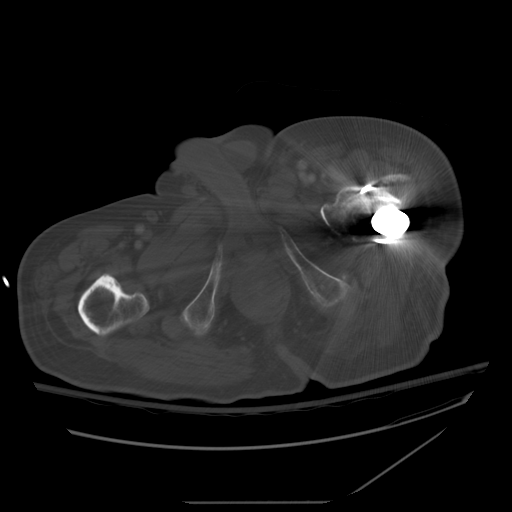
[im 11/95  soft-tissue]
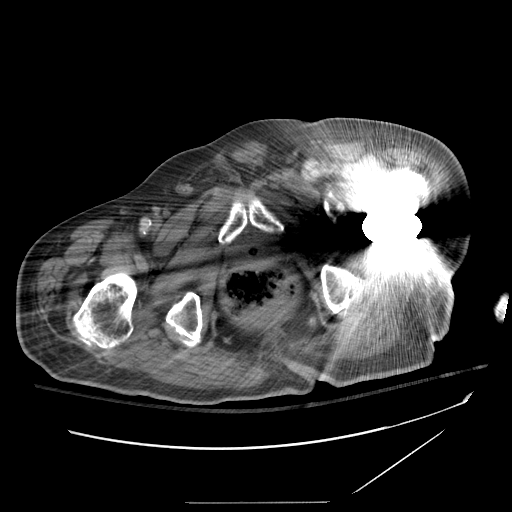
[im 21/95  soft-tissue]
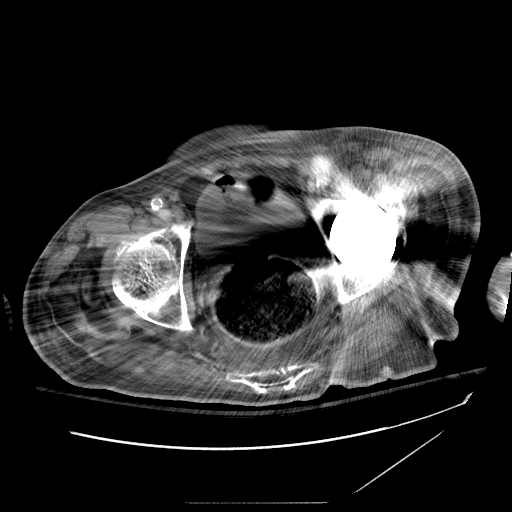
[im 27/95  soft-tissue]
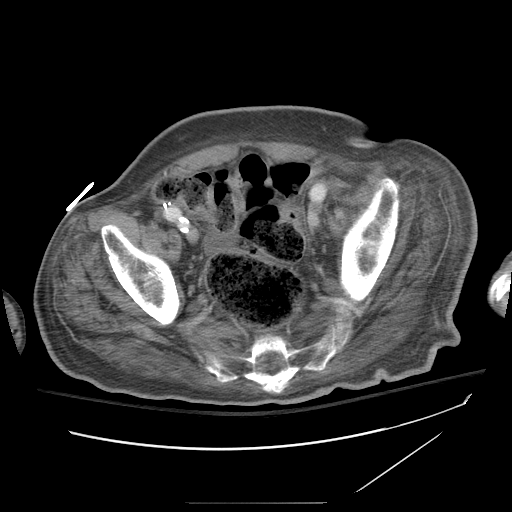
[im 32/95  soft-tissue]
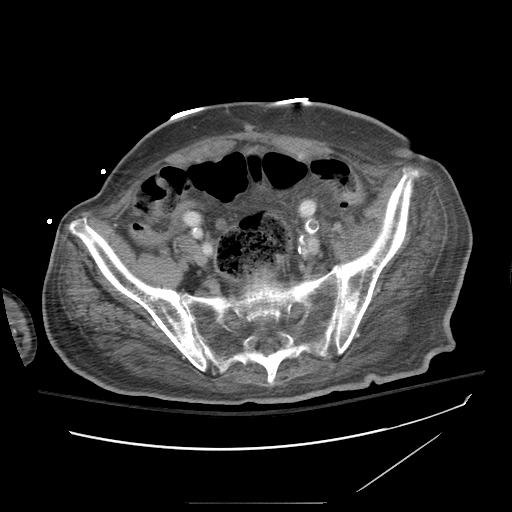
[im 42/95  soft-tissue]
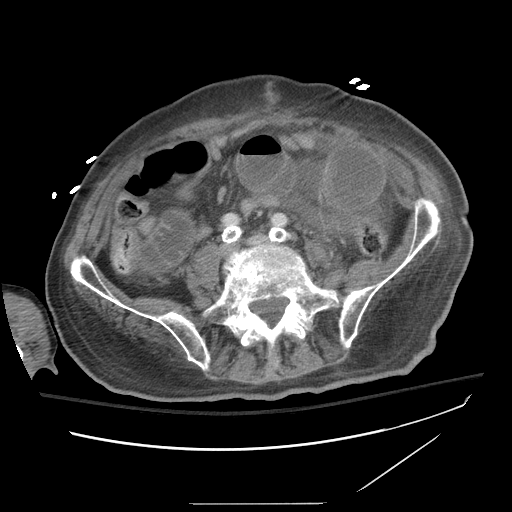
[im 48/95  soft-tissue]
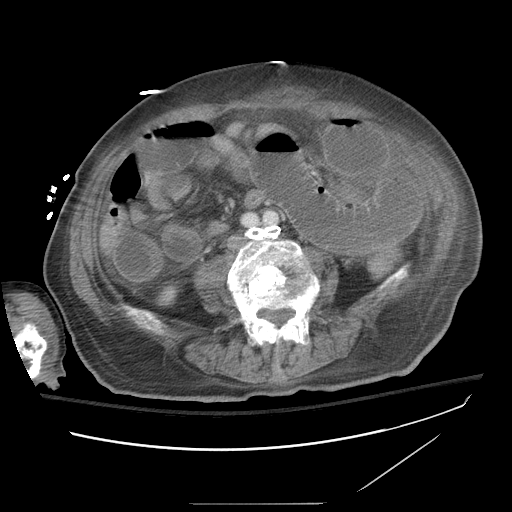
[im 53/95  soft-tissue]
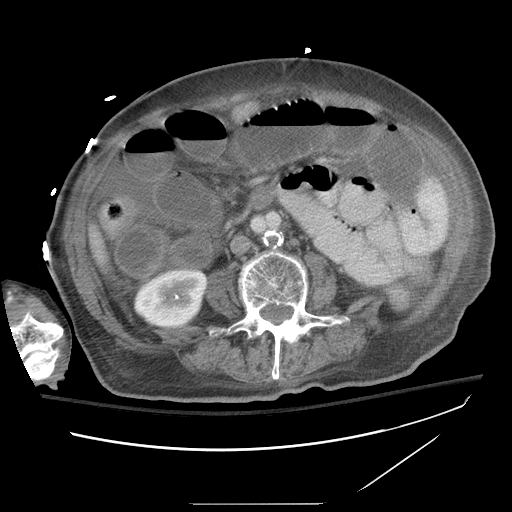
[im 63/95  soft-tissue]
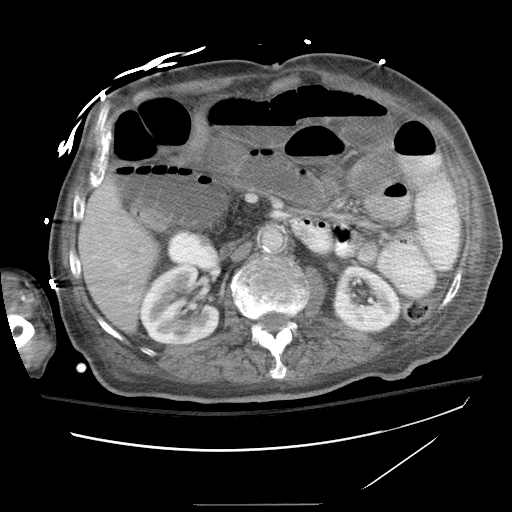
[im 63/95  bone]
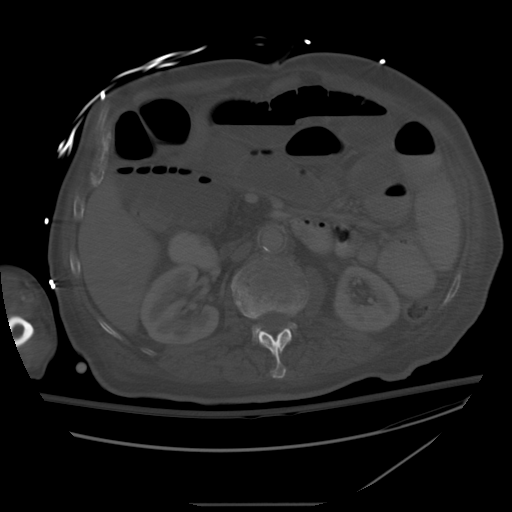
[im 68/95  soft-tissue]
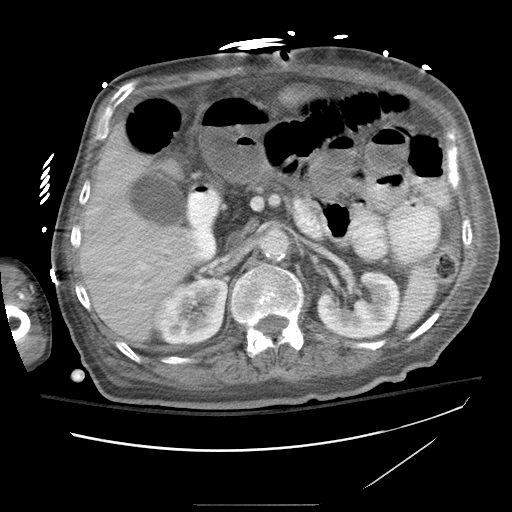
[im 74/95  soft-tissue]
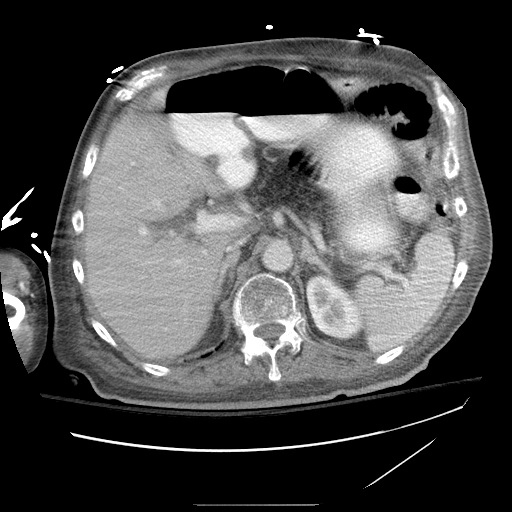
[im 84/95  soft-tissue]
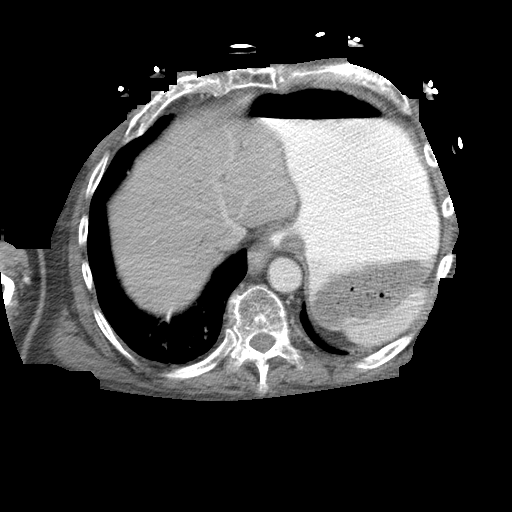
[im 89/95  soft-tissue]
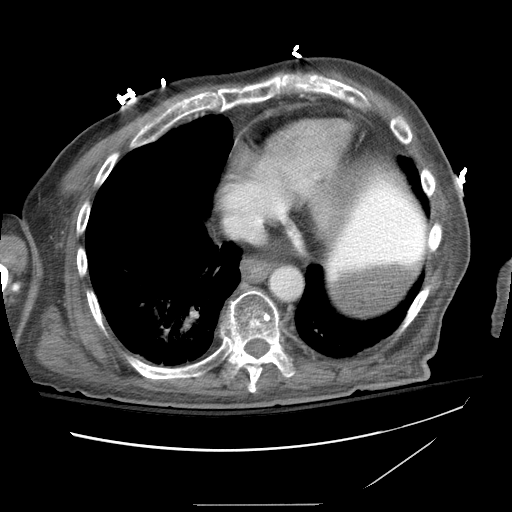

[Series 401: reformatted · coronal · 0.90mm/px · 3 of 105 slices shown]
[im 35/105  soft-tissue]
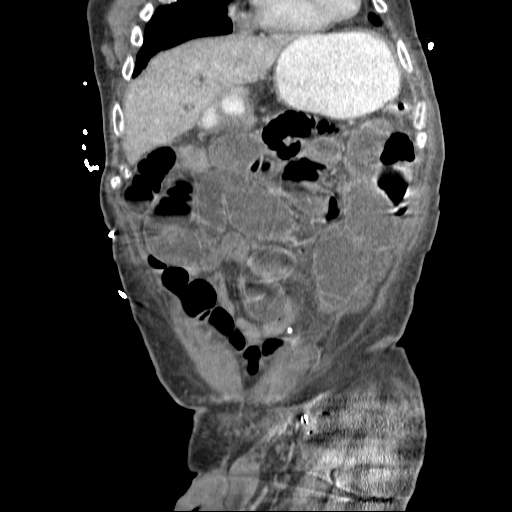
[im 47/105  soft-tissue]
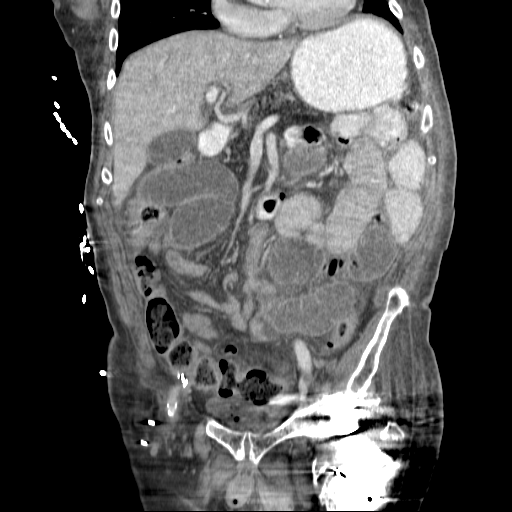
[im 58/105  soft-tissue]
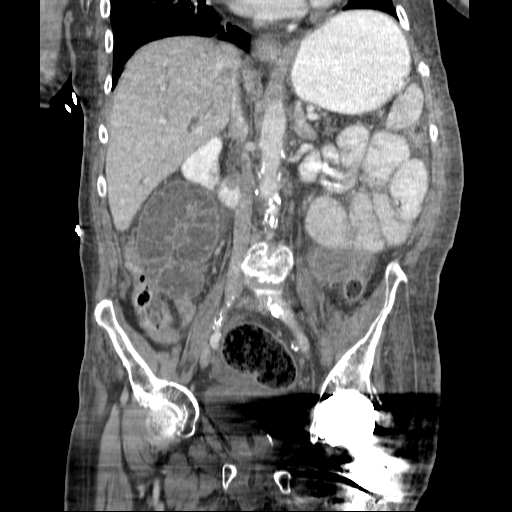

[16 of 46 positions shown; findings below may reference images not displayed]

FINDINGS: The lung bases demonstrate scarring type changes.  There
is mild distal esophageal wall thickening without discrete mass.
There is intra and extrahepatic biliary dilatation.  The common
bile duct measures 13 mm in the porta hepatis.  There is severe
pancreatic atrophy with marked fatty change.  The spleen is normal
in size.  The adrenal glands are slightly enlarged but no masses
are seen.  Kidneys demonstrate mild renal cortical thinning but no
hydronephrosis or mass.  There is a lower pole right renal
calculus.

There are surgical changes related to aortofemoral bypass surgery.

The stomach is distended with contrast.  There is marked distention
of the small bowel. Consistent with a small bowel obstruction.  No
free air or free abdominal fluid.

Examination of the bony structures demonstrates severe
osteoporosis.  There are changes from   Vertebral plasty is at L4
and L5.
IMPRESSION: 1.  Findings consistent with a small bowel obstruction with dilated
small bowel throughout the abdomen.
2.  Surgical changes from aorto-iliac bypass surgery.
3.  Intra and extrahepatic biliary dilatation.  This may be chronic
and due to a distal stricture.  Correlation is suggested with liver
function studies.  No pancreatic head mass is seen.  There is
severe fatty atrophy of the pancreas.
4.  Right lower pole renal calculus.
5.  Possible mild edema or fluid around the gallbladder.

CT PELVIS
FINDINGS: The rectum is distended with stool.  The sigmoid colon
is unremarkable.  There are dilated small bowel loops to the mid
ileal region where there are decompressed/nondilated small bowel
loops.  The transition appears to be in the right upper pelvis at
the level of the iliac crest and is likely due to adhesions.  No
masses seen.

The bladder is grossly normal.  There is artifact from a left hip
prosthesis.
IMPRESSION: 1.  Small bowel obstruction with transition from dilated to
nondilated small bowel in the right upper pelvis likely due to
adhesions.

## 2010-01-05 IMAGING — CR DG ABD PORTABLE 1V
1 series · 1 of 1 positions shown · non-contrast
Comparison: the previous day's study

CLINICAL DATA: Bowel obstruction

ABDOMEN - 1 VIEW

[AP]
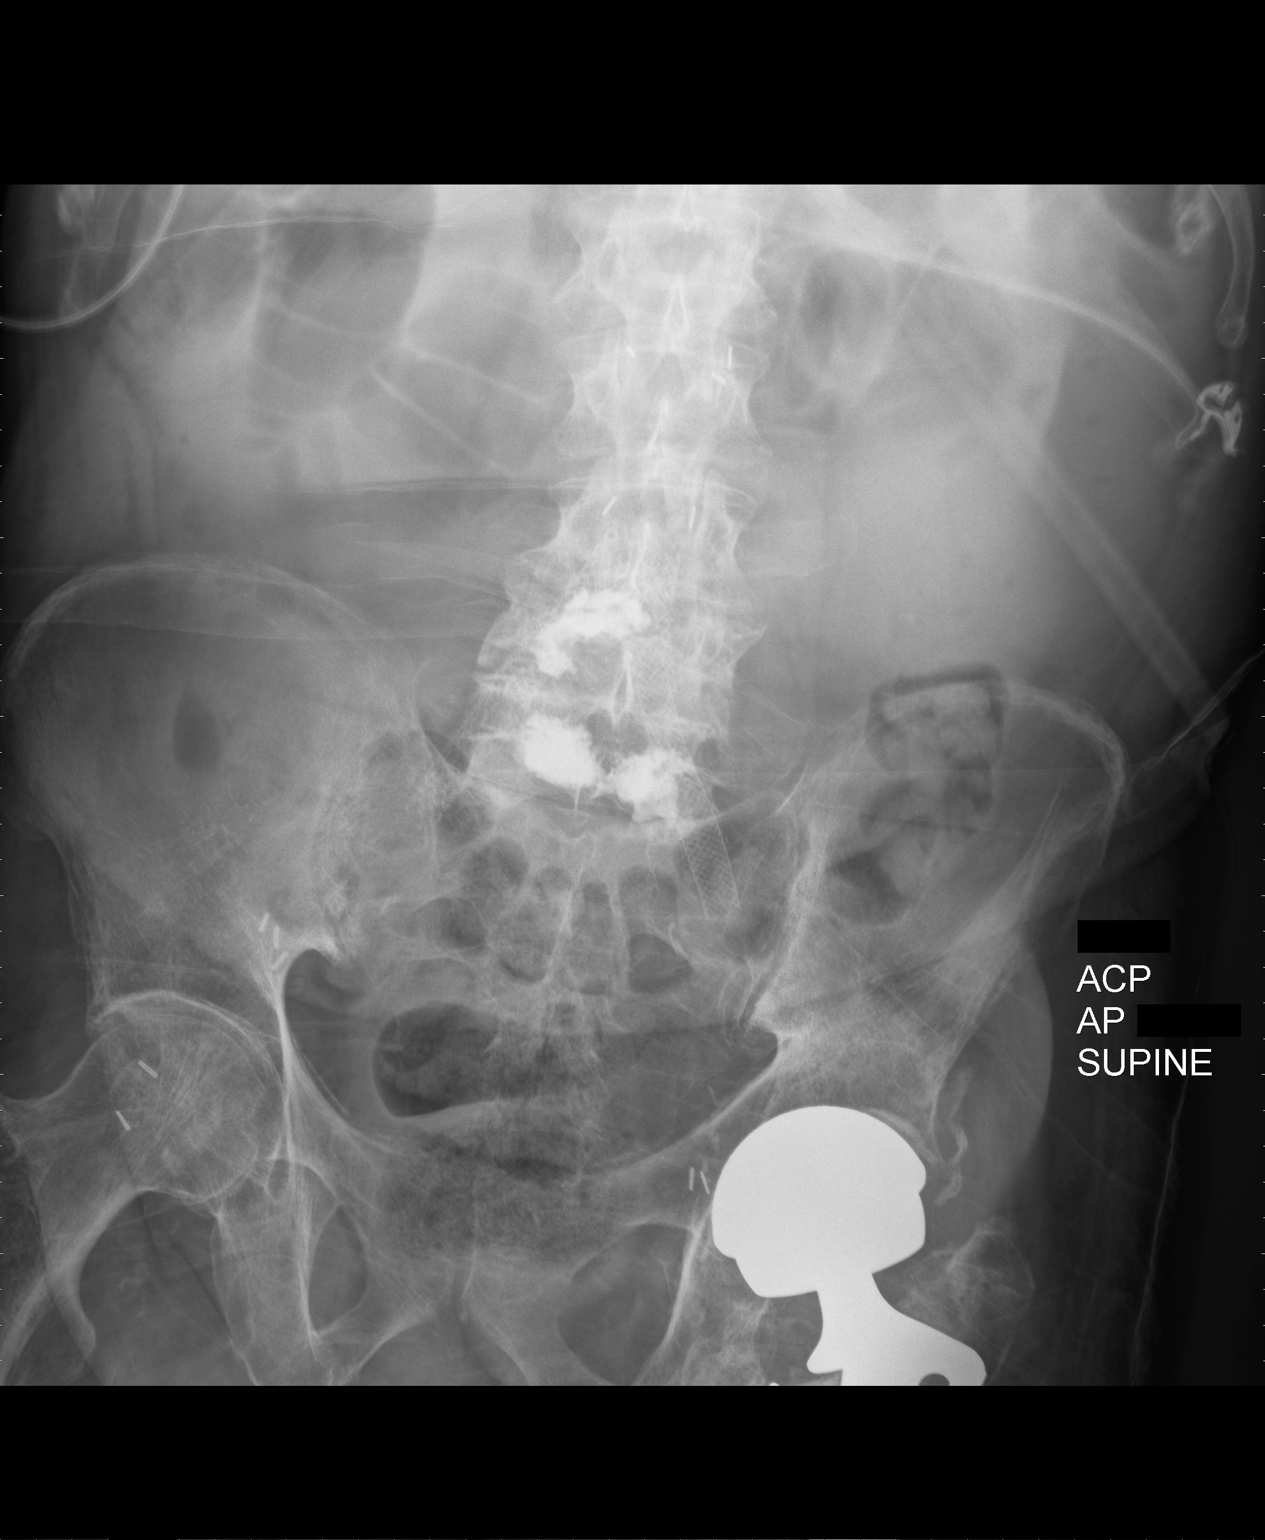

[1 of 1 positions shown; findings below may reference images not displayed]

FINDINGS: Several dilated loops of small bowel are evident in the
upper abdomen, similar in number and degree of dilatation since the
previous film.  The colon appears decompressed.  Vascular clips
project over the inguinal regions and in the mid abdomen.
Bilateral iliac arterial stents.  Changes of   vertebral
augmentation at L4 and L5.  Left hip prosthesis.
IMPRESSION: Little change in apparent small bowel obstruction.

## 2010-01-06 IMAGING — CR DG ABD PORTABLE 1V
1 series · 1 of 1 positions shown · non-contrast
Comparison: 02/22/2008

CLINICAL DATA: Bowel obstruction - nausea vomiting.  Follow-up.

ABDOMEN - 1 VIEW

[view not recorded]
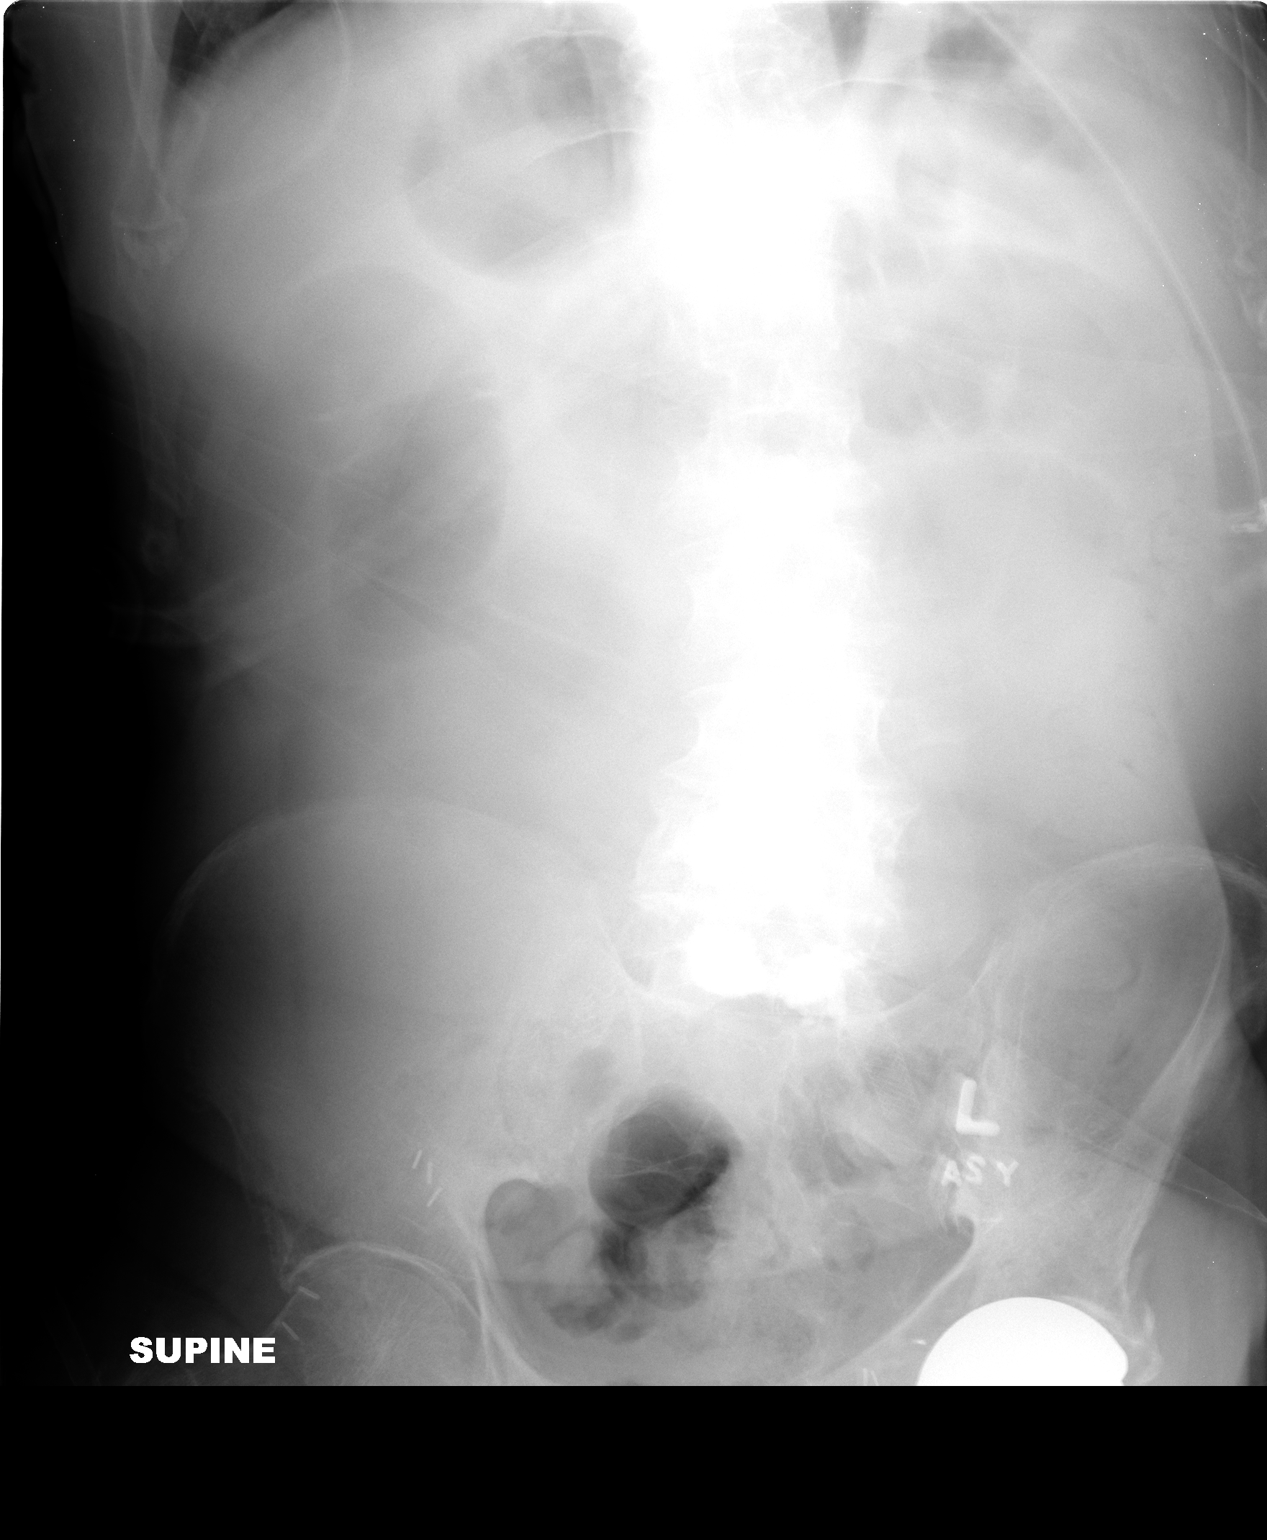

[1 of 1 positions shown; findings below may reference images not displayed]

FINDINGS: Distended small bowel loops are unchanged.
Small amount of gas in the colon and rectum noted.
Evidence of prior vertebroplasty in the lower lumbar spine,
bilateral iliac stents, and left hip replacement are unchanged.
IMPRESSION: Stable exam with unchanged distended small bowel loops.

## 2010-01-07 IMAGING — CR DG ABD PORTABLE 1V
1 series · 1 of 1 positions shown · non-contrast
Comparison: CT abdomen and pelvis 02/20/2008 and plain films the
abdomen [DATE] and 02/22/2008.

CLINICAL DATA: Nausea and vomiting.

ABDOMEN - 1 VIEW

[view not recorded]
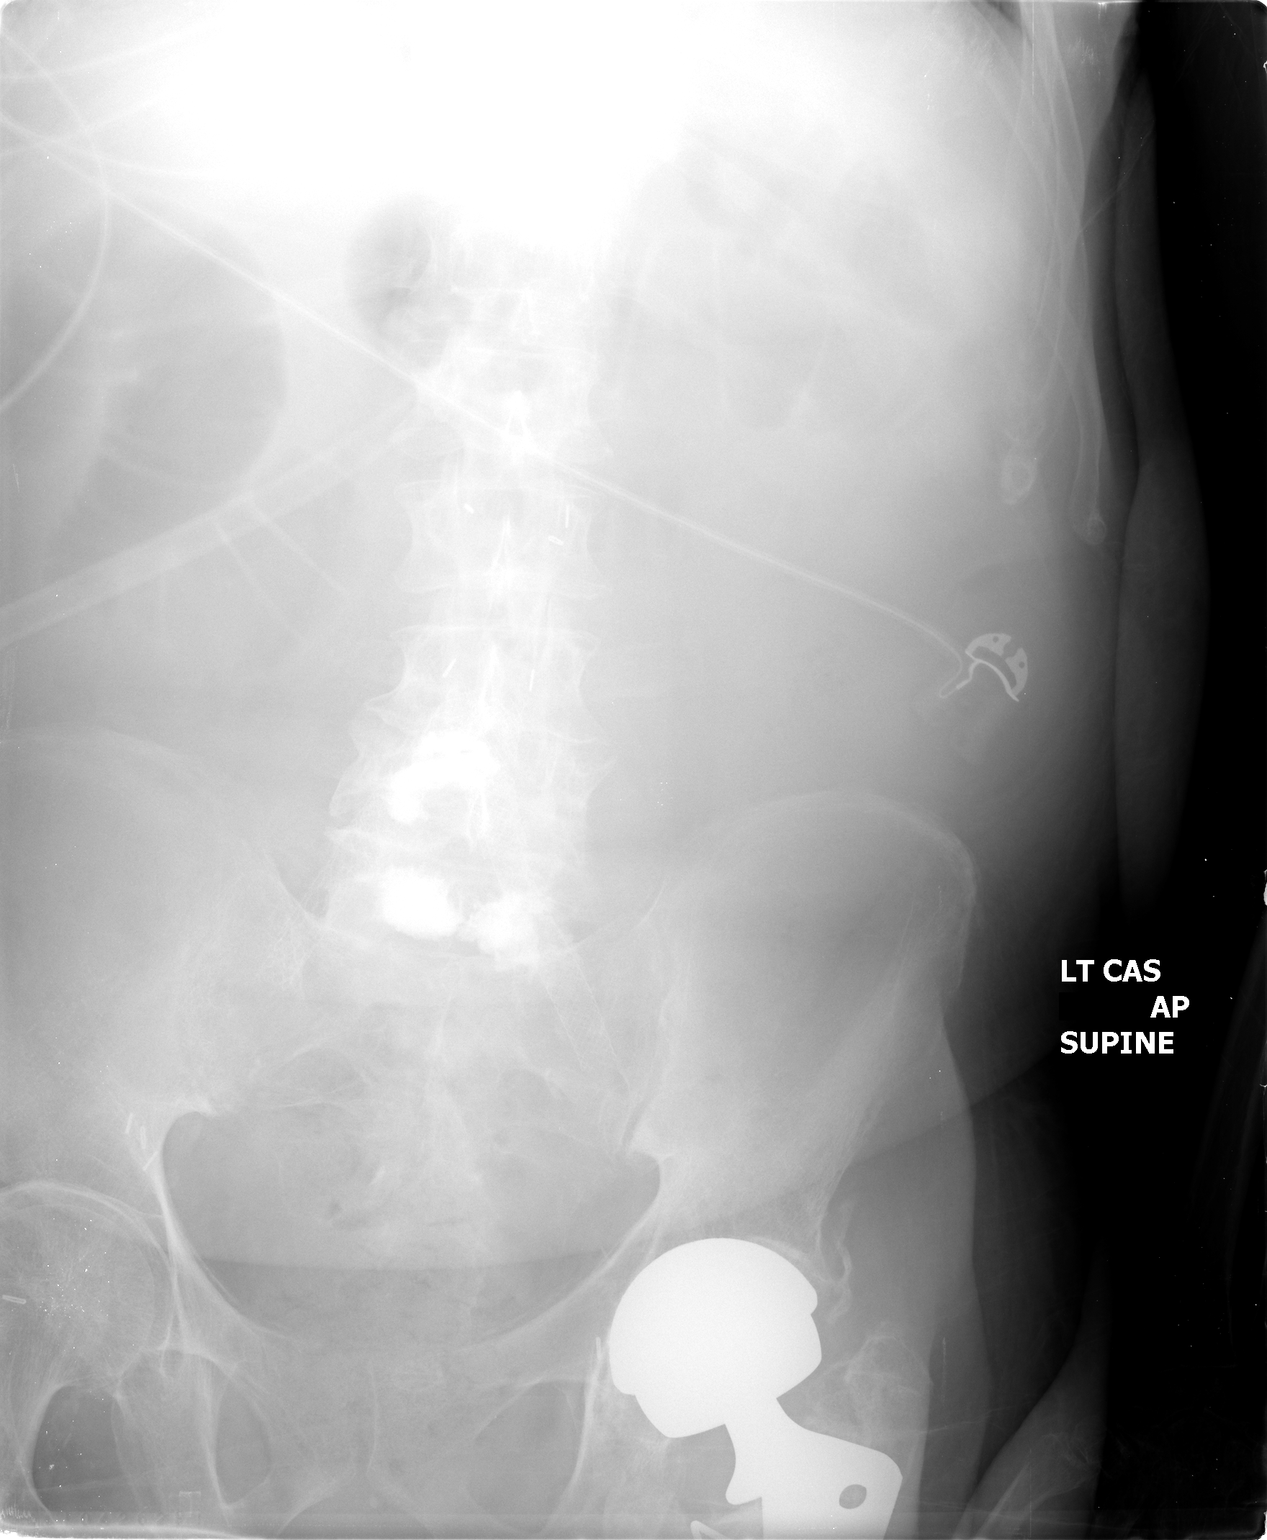

[1 of 1 positions shown; findings below may reference images not displayed]

FINDINGS: There has been no interval change in gaseous distention
of small bowel best seen in the left upper quadrant will a loop
measuring 4.7 cm is identified.  There is some gas and stool in the
rectum.
IMPRESSION: No interval change in small bowel distention compatible with small
bowel obstruction.

## 2010-01-08 IMAGING — CR DG ABDOMEN ACUTE W/ 1V CHEST
3 series · 3 of 3 positions shown · non-contrast
Comparison: [DATE]

CLINICAL DATA: Bowel obstruction

ACUTE ABDOMEN SERIES (ABDOMEN 2 VIEW & CHEST 1 VIEW)

[w abdomen decub *]
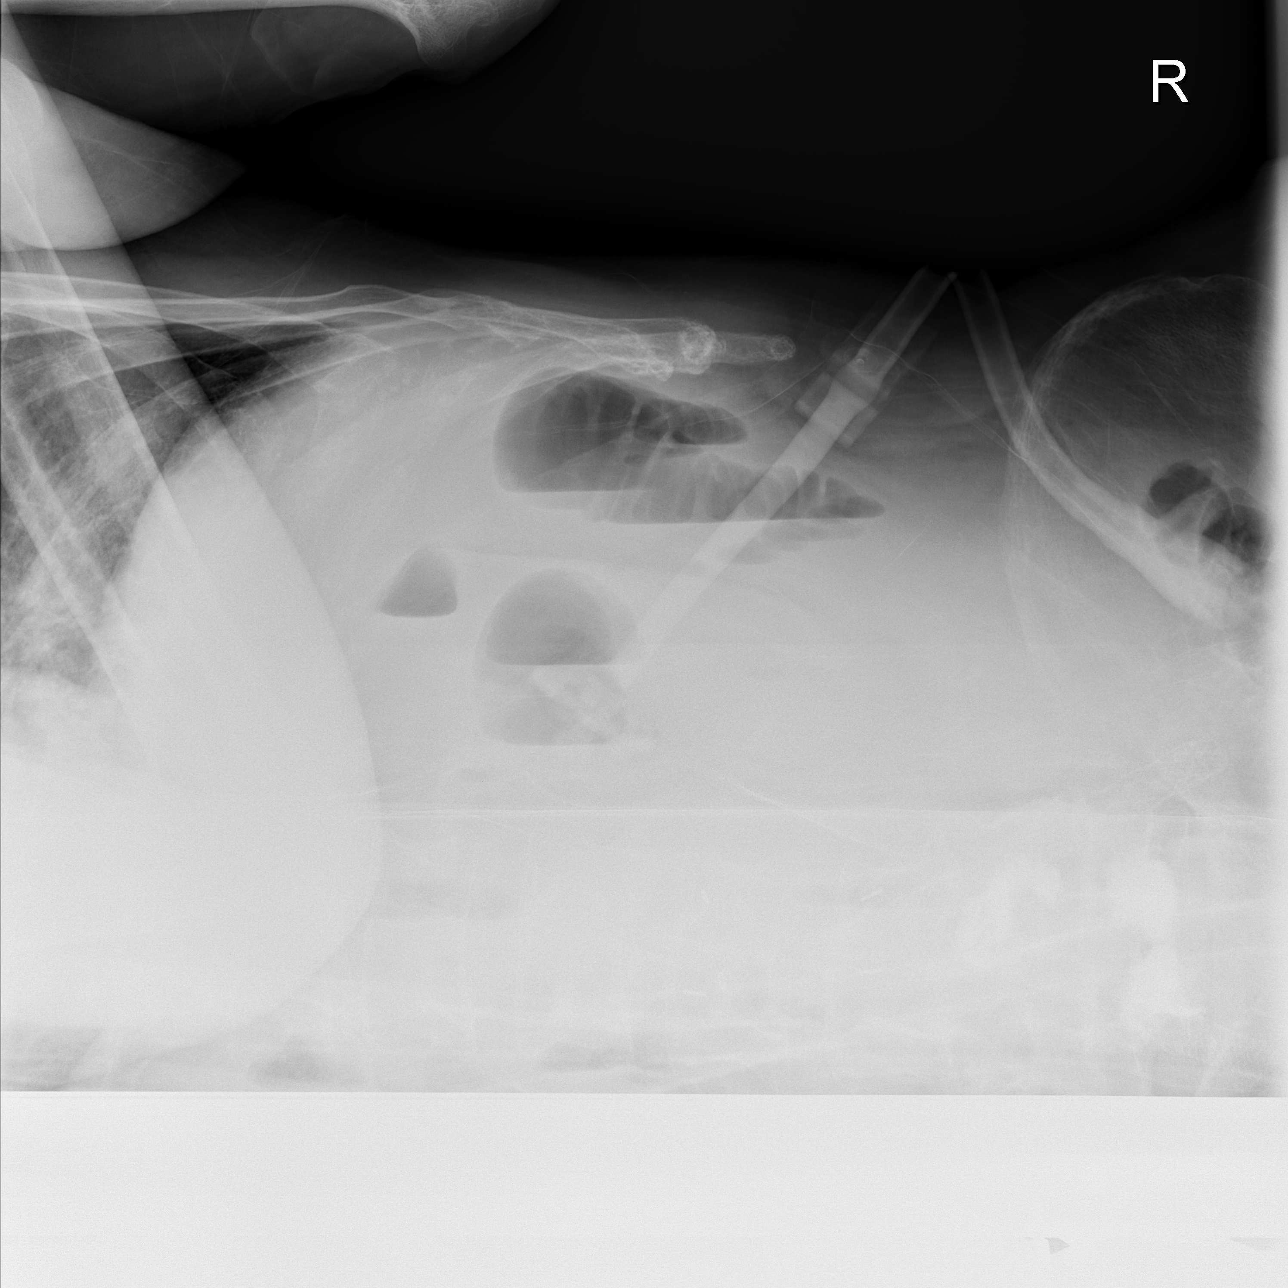

[t abdomen supine]
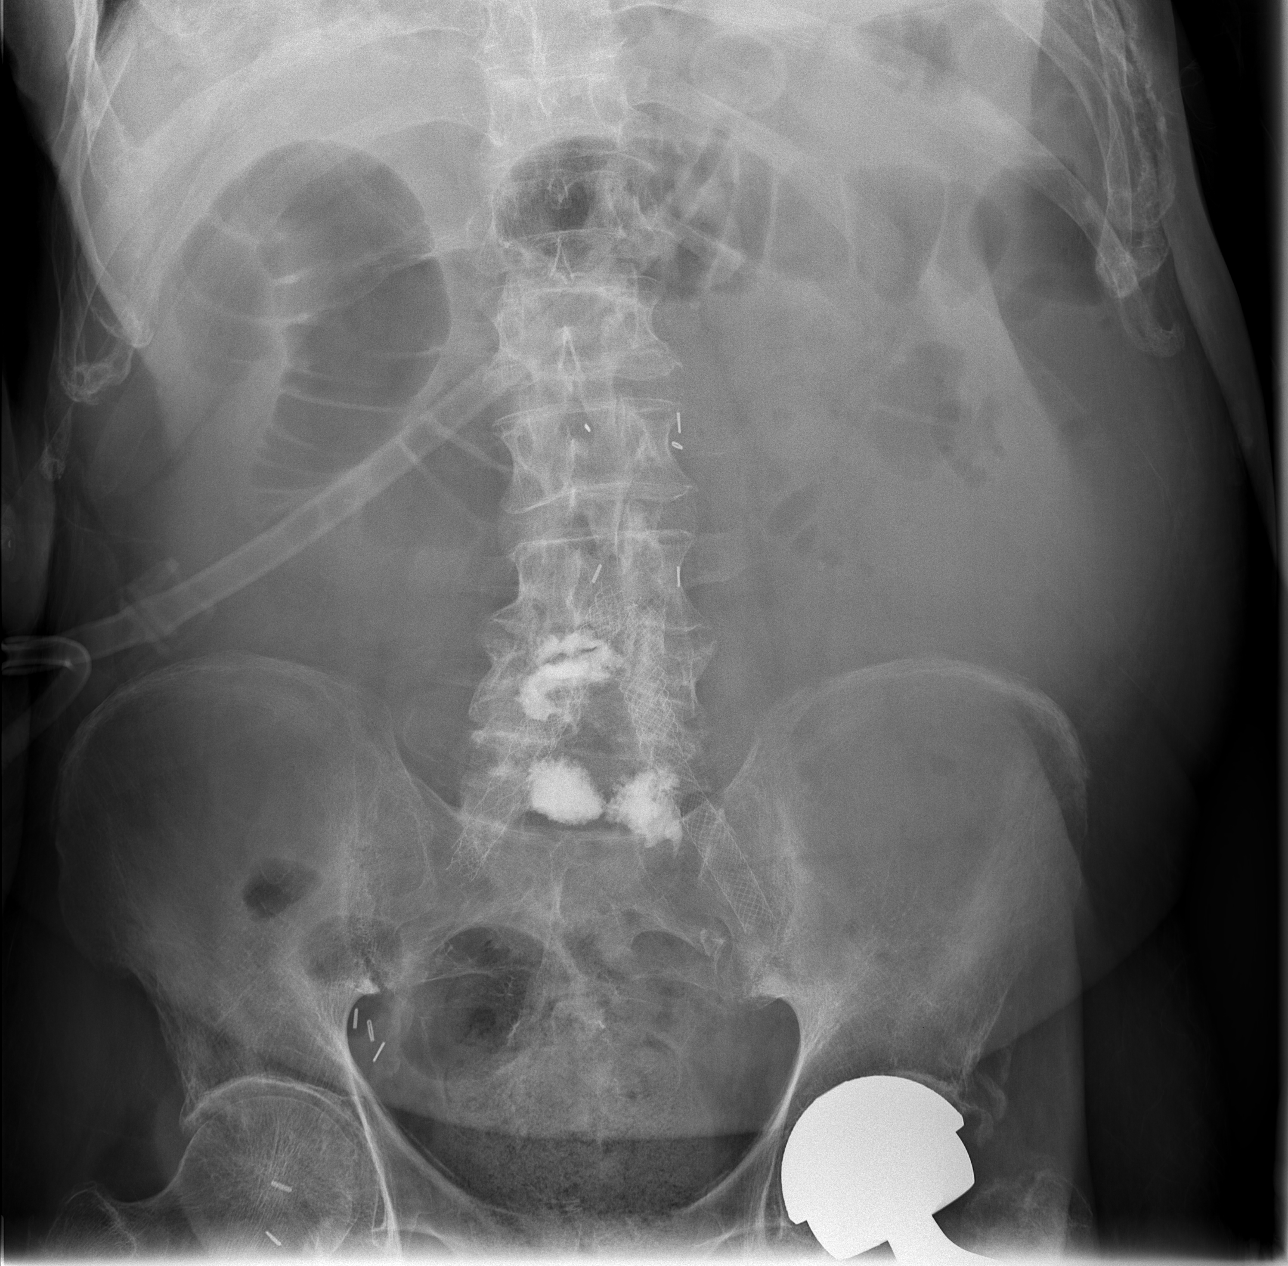

[t chest supine]
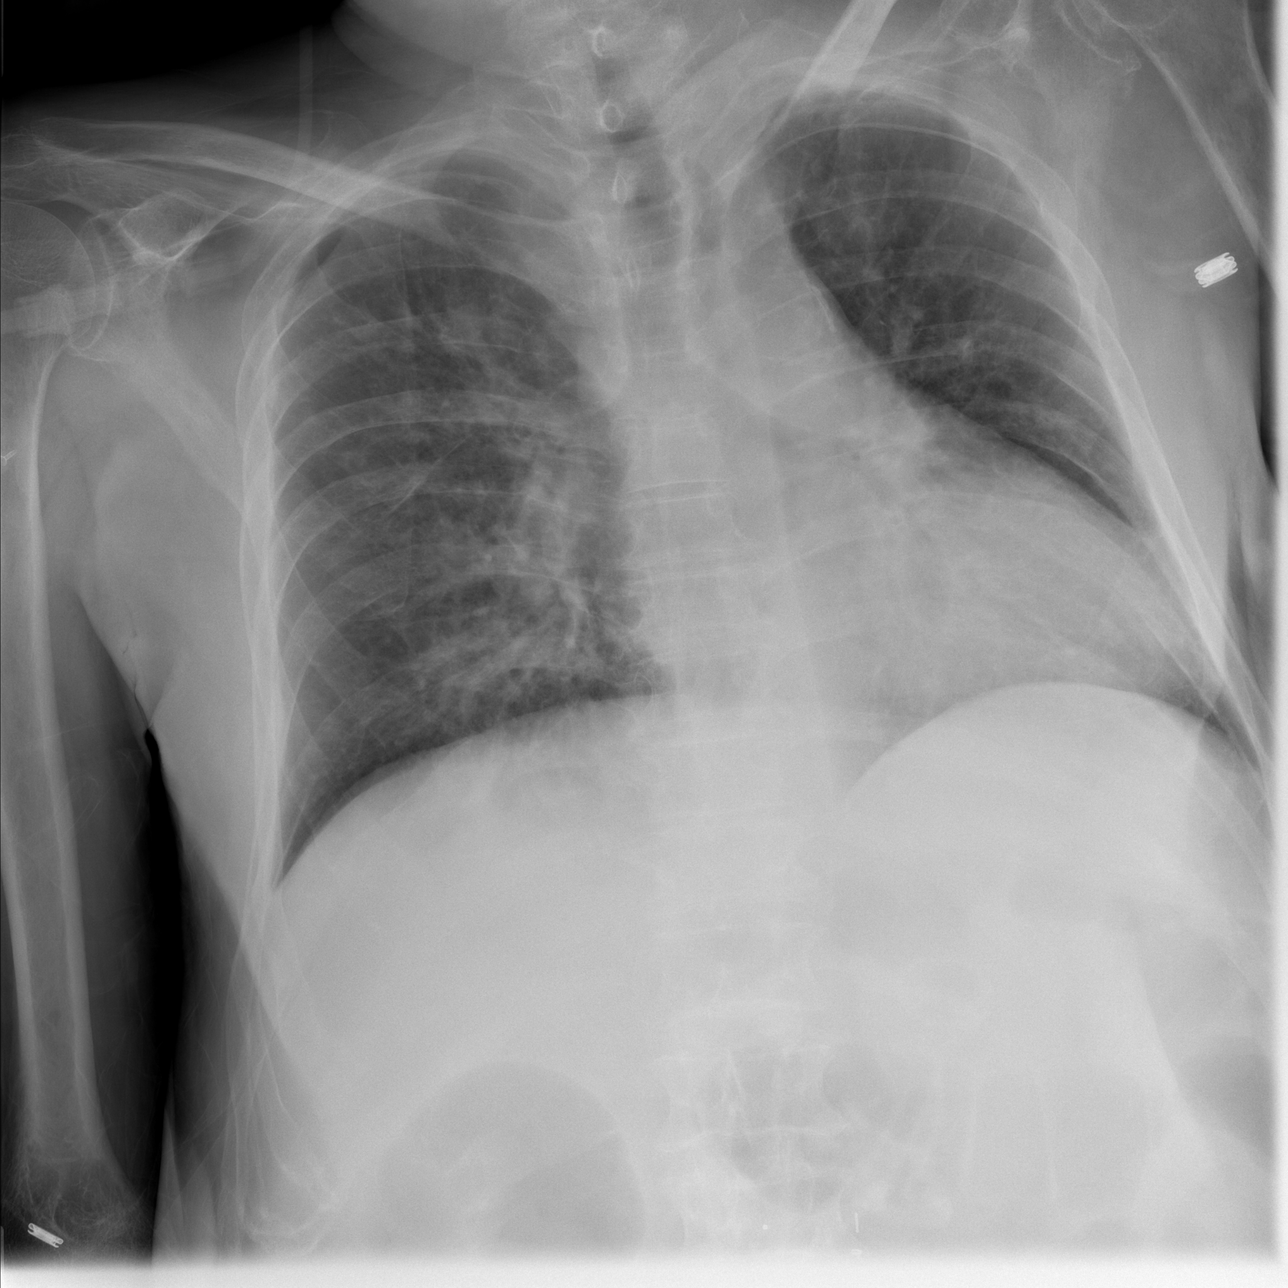

[3 of 3 positions shown; findings below may reference images not displayed]

FINDINGS: There continue to be a dilated fluid and air filled loops
of small bowel consistent with partial small bowel obstruction.
Gastrostomy remains in place.  There is some gas and fecal matter
within the colon.  No free air.

One-view chest shows chronic interstitial markings at the bases.
One could not rule out a degree of basilar atelectasis or
infiltrate, but no pronounced disease is evident.
IMPRESSION: Persistent partial small bowel obstruction.  No free air.

## 2010-01-09 IMAGING — CR DG CHEST 1V
1 series · 1 of 1 positions shown · non-contrast
Comparison: 02/26/2008

CLINICAL DATA: Bowel obstruction.  Nausea.  Vomiting.

CHEST - 1 VIEW

[view not recorded]
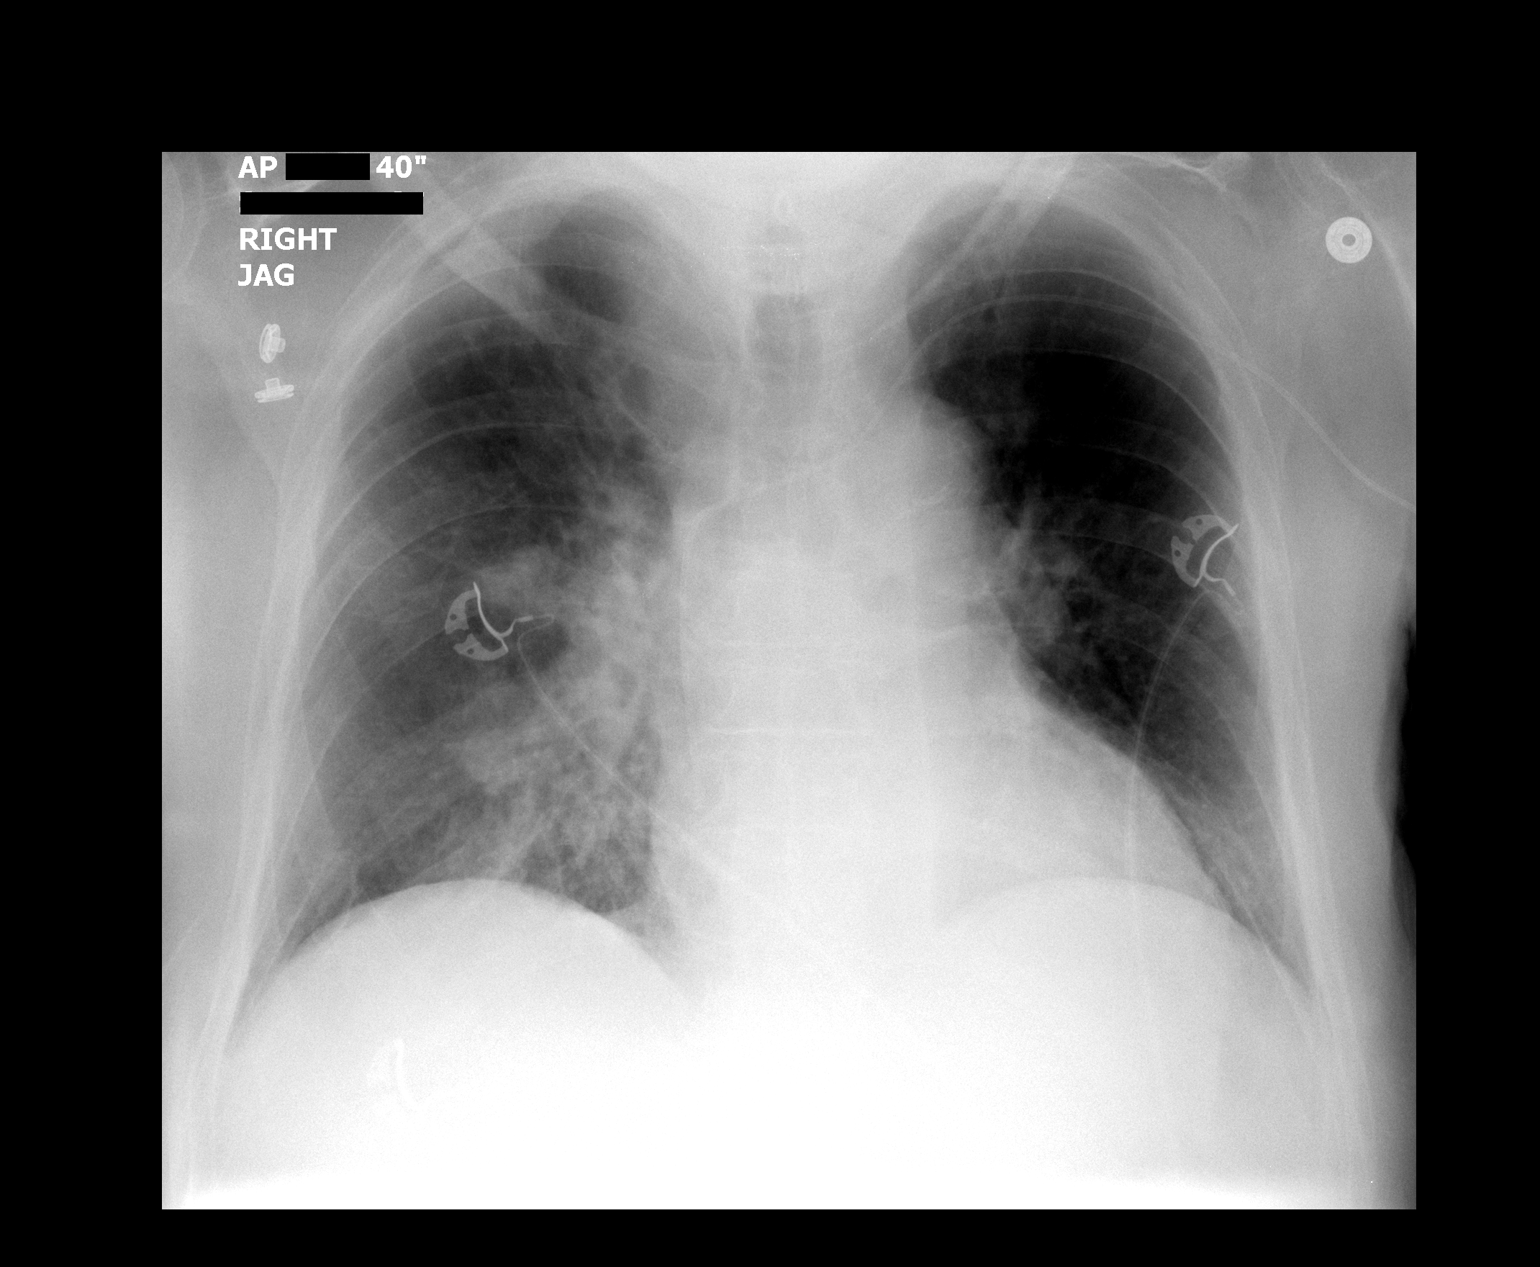

[1 of 1 positions shown; findings below may reference images not displayed]

FINDINGS: Right hilar fullness persists.  Cardiopericardial
silhouette unchanged.  Left upper extremity PICC is present with
the tip at the cavoatrial junction.  Improving aeration of the
right upper lobe.  Persistent airspace disease at the medial right
lower lobe.  Continued follow-up to ensure clearing is recommended
to exclude underlying mass lesion.  Left lung clear.
IMPRESSION: 1.  Improving aeration of the right upper lobe.
2.  Persistent right lower lobe airspace disease/pneumonia.

## 2010-01-09 IMAGING — US IR FLUORO GUIDE CV LINE*L*
1 series · 1 of 1 positions shown · non-contrast
Comparison: none

CLINICAL DATA: 72-year-old male bowel obstruction, nausea,
vomiting, PICC line placement for T N A
TECHNIQUE: The left arm was prepped with chlorhexidine, draped in
the usual sterile fashion using maximum barrier technique and
infiltrated locally with 1% Lidocaine.  Ultrasound demonstrated
patency of the left brachial vein.  Under real-time ultrasound
guidance, this vein was accessed with a 21 gauge micropuncture
needle.  Ultrasound image documentation was performed.  The needle
was exchanged over a guidewire for a peel-away sheath through which
a 5 French double lumen lumen PICC trimmed to 46cm was advanced,
positioned with its tip at the distal SVC/right atrial junction.
Fluoroscopy during the procedure and fluoro spot radiograph
confirms appropriate catheter position.  The catheter was flushed,
secured to the skin with Prolene sutures, and covered with a
sterile dressing.  No immediate complication.

Fluoroscopy Time: 3.3 minutes.

[Series 1: ir fluoro guide cv line*left* · 1 of 1 slices shown]
[im 1/1]
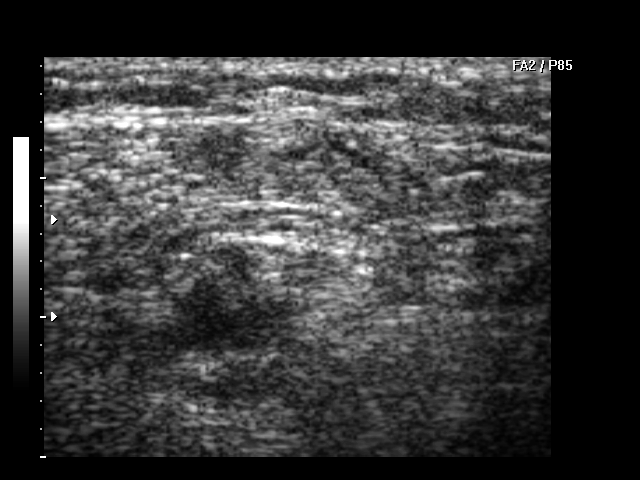

[1 of 1 positions shown; findings below may reference images not displayed]

UPPER EXTREMITY PICC PLACEMENT WITH ULTRASOUND AND FLUORO GUIDANCE

Initially, the malpositioned right upper extremity PICC line was
visualized under fluoroscopy.  This was originally inserted by the
IV therapy team.  Over a guide wire, this PICC line cannot be
advanced centrally.  Contrast injection confirms a near occlusive
chronic stenosis of the right subclavian vein.  Therefore, this
PICC line was removed and a left upper extremity PICC line was
inserted as detailed below.
IMPRESSION: Technically successful left arm PICC placement with ultrasound and
fluoroscopic guidance.  The catheter is ready for use.

New occlusive chronic stenosis of the right subclavian vein.

## 2010-01-10 IMAGING — CR DG ABDOMEN 2V
3 series · 3 of 3 positions shown · non-contrast
Comparison: 02/25/2008

CLINICAL DATA: Follow-up small bowel obstruction.  Abdominal pain.
Vomiting.

ABDOMEN - 2 VIEW

[t abdomen supine (1 of 2)]
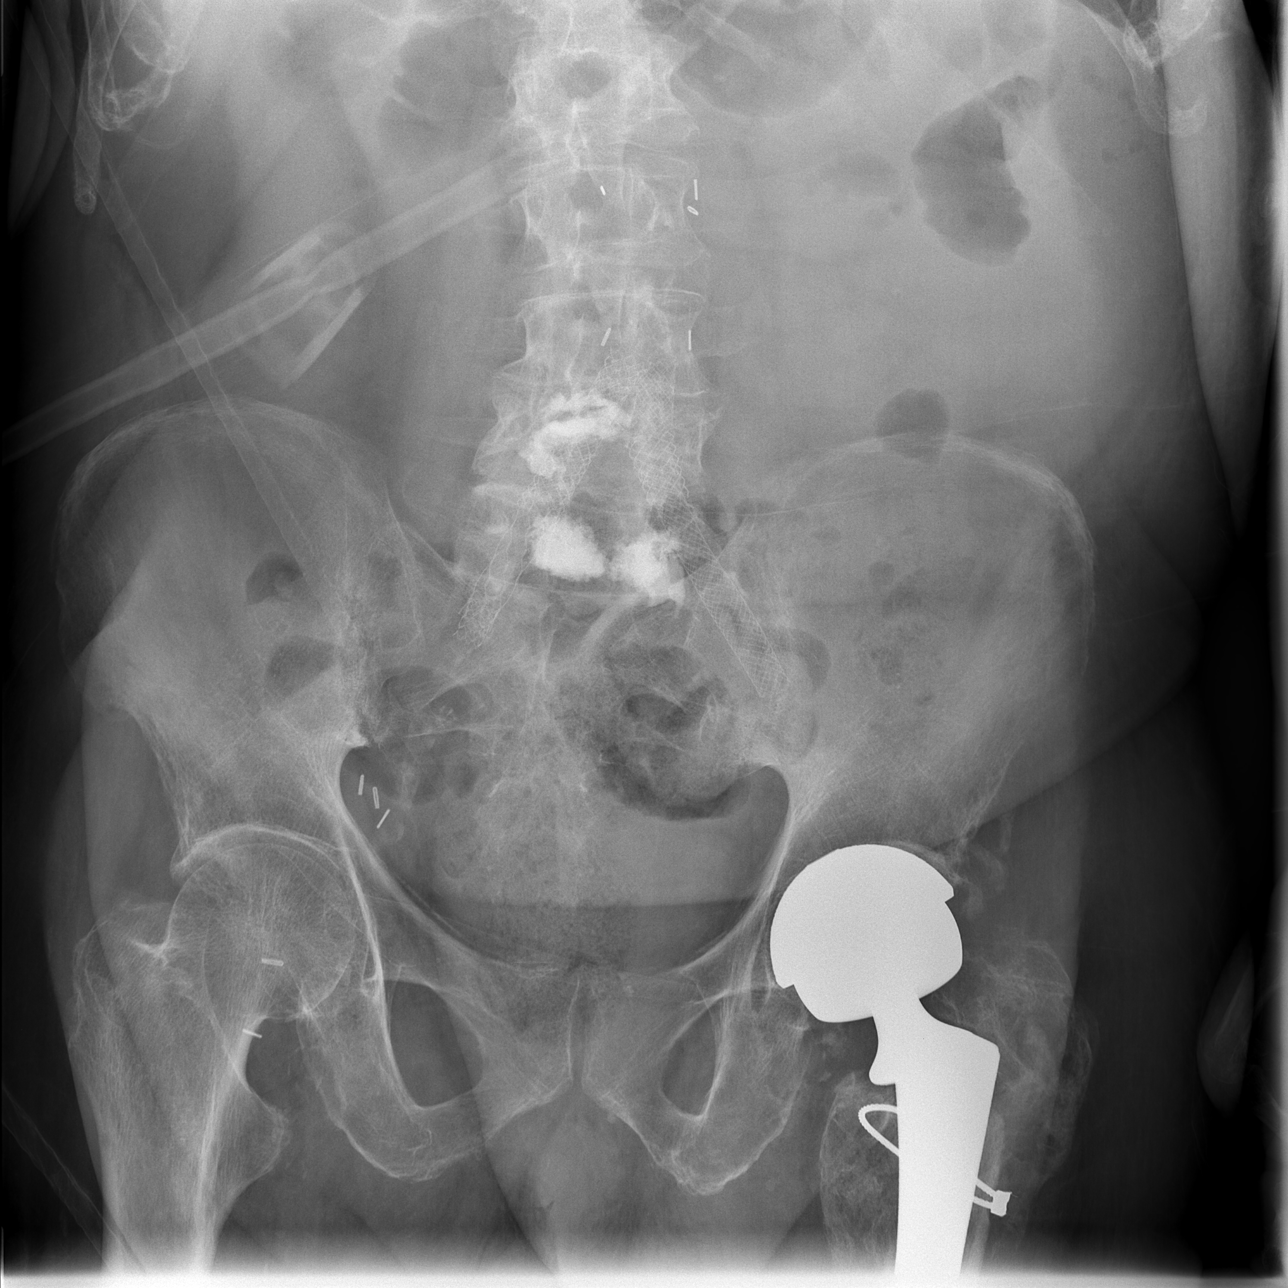

[t abdomen supine (2 of 2)]
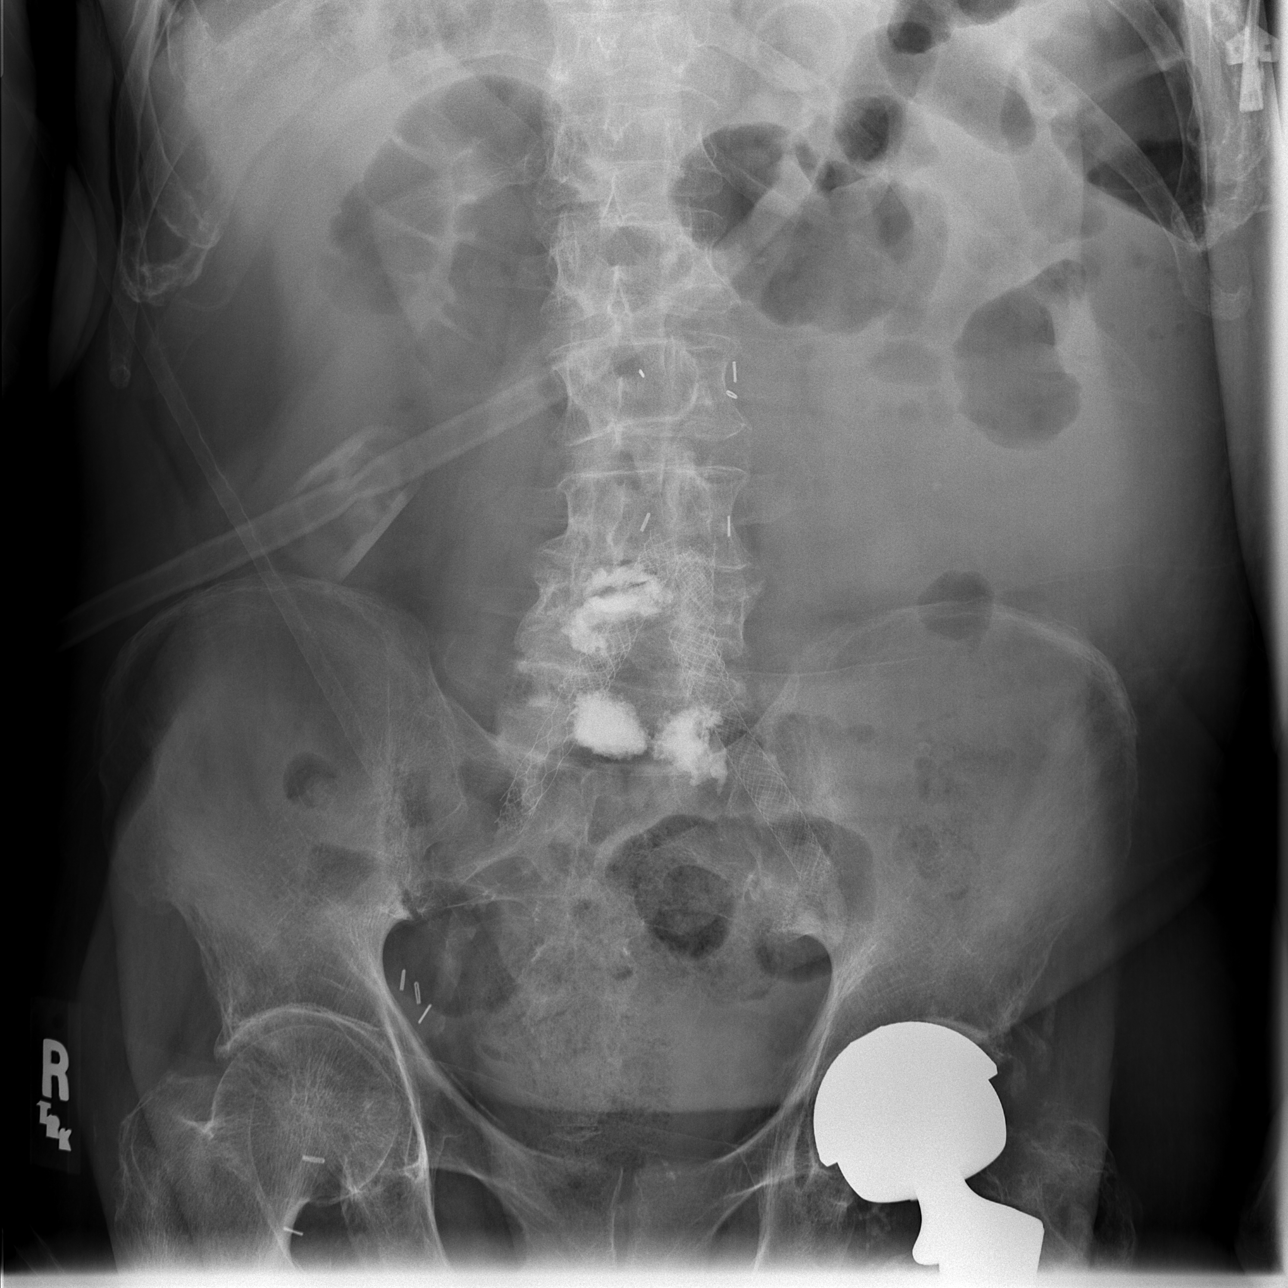

[w abdomen decub]
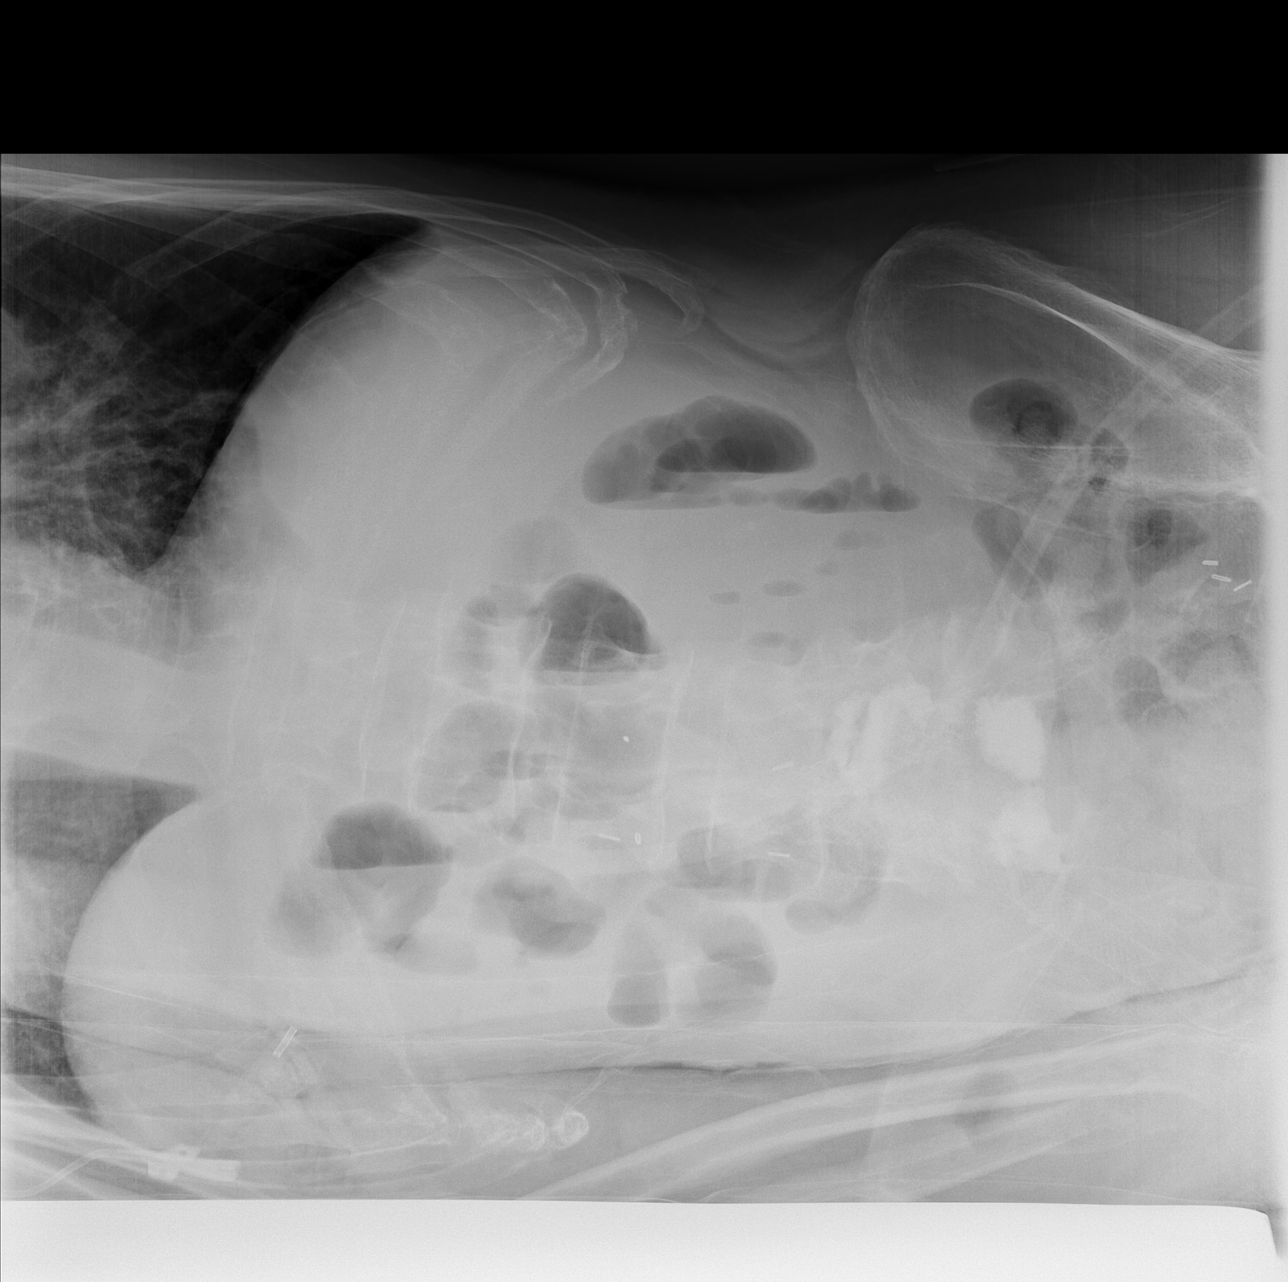

[3 of 3 positions shown; findings below may reference images not displayed]

FINDINGS: Mildly dilated small bowel loops and multiple air-fluid
levels are again seen within the abdomen and are not significantly
changed. Percutaneous gastrostomy tube remains in place.  There is
no evidence of free peritoneal air.  No significant changes are
seen compared to prior study.
IMPRESSION: Mildly dilated small bowel loops, without significant change since
prior exam.

## 2010-01-13 IMAGING — CR DG ABDOMEN 2V
4 series · 4 of 4 positions shown · non-contrast
Comparison: [DATE]

CLINICAL DATA: Nausea and vomiting.  Possible bowel obstruction.

ABDOMEN - 2 VIEW

[w abdomen decub (1 of 2)]
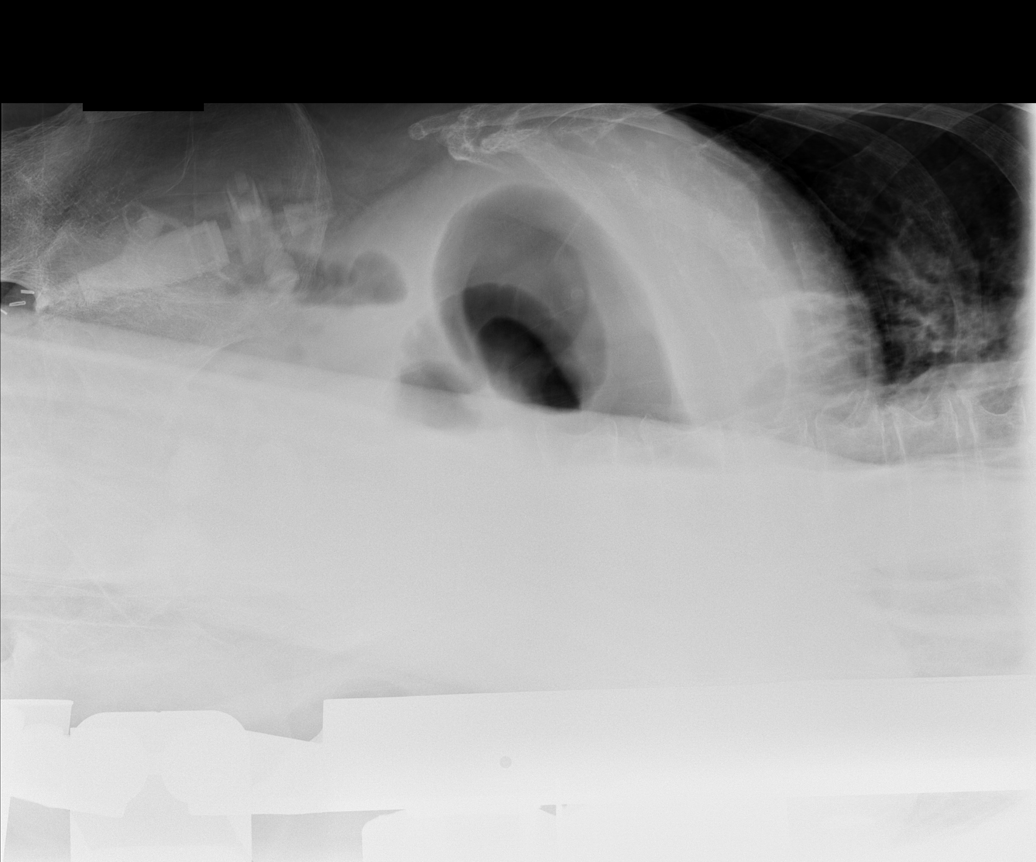

[w abdomen decub (2 of 2)]
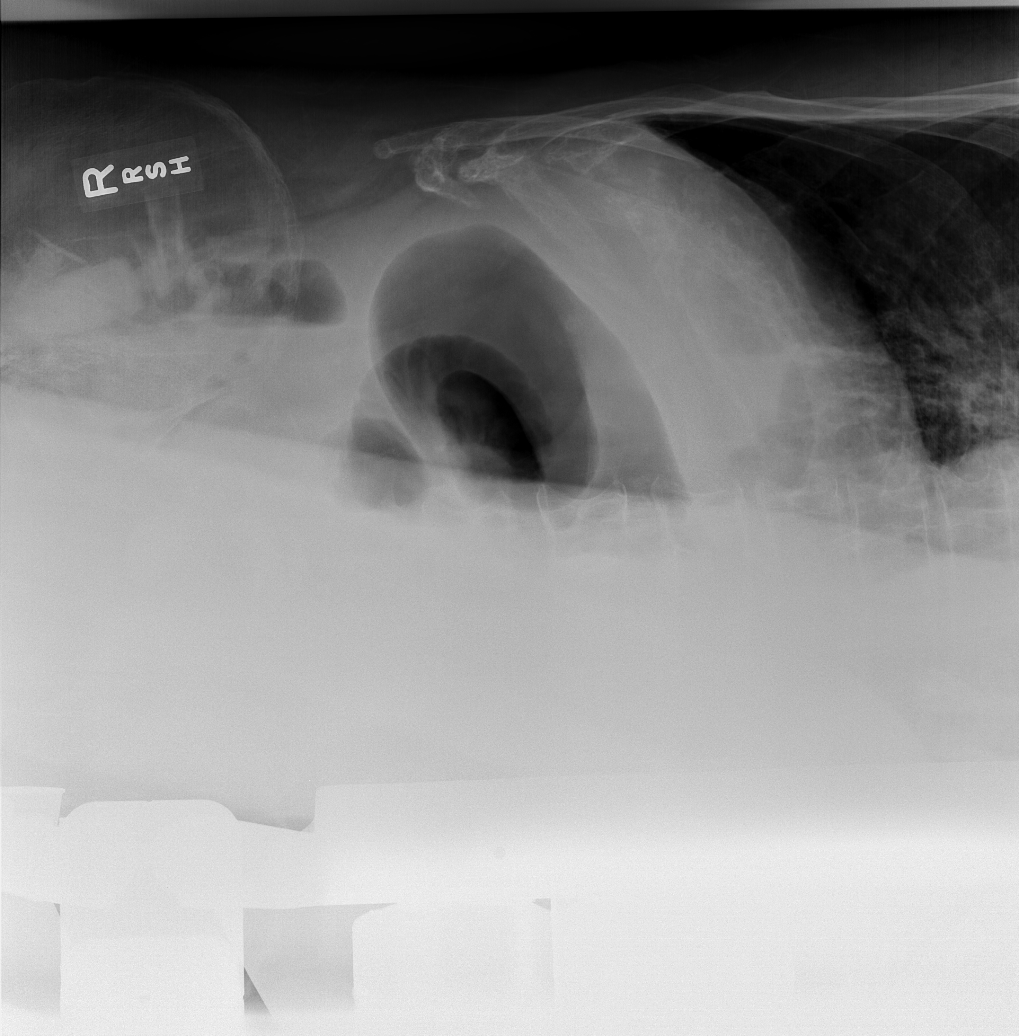

[t abdomen supine (1 of 2)]
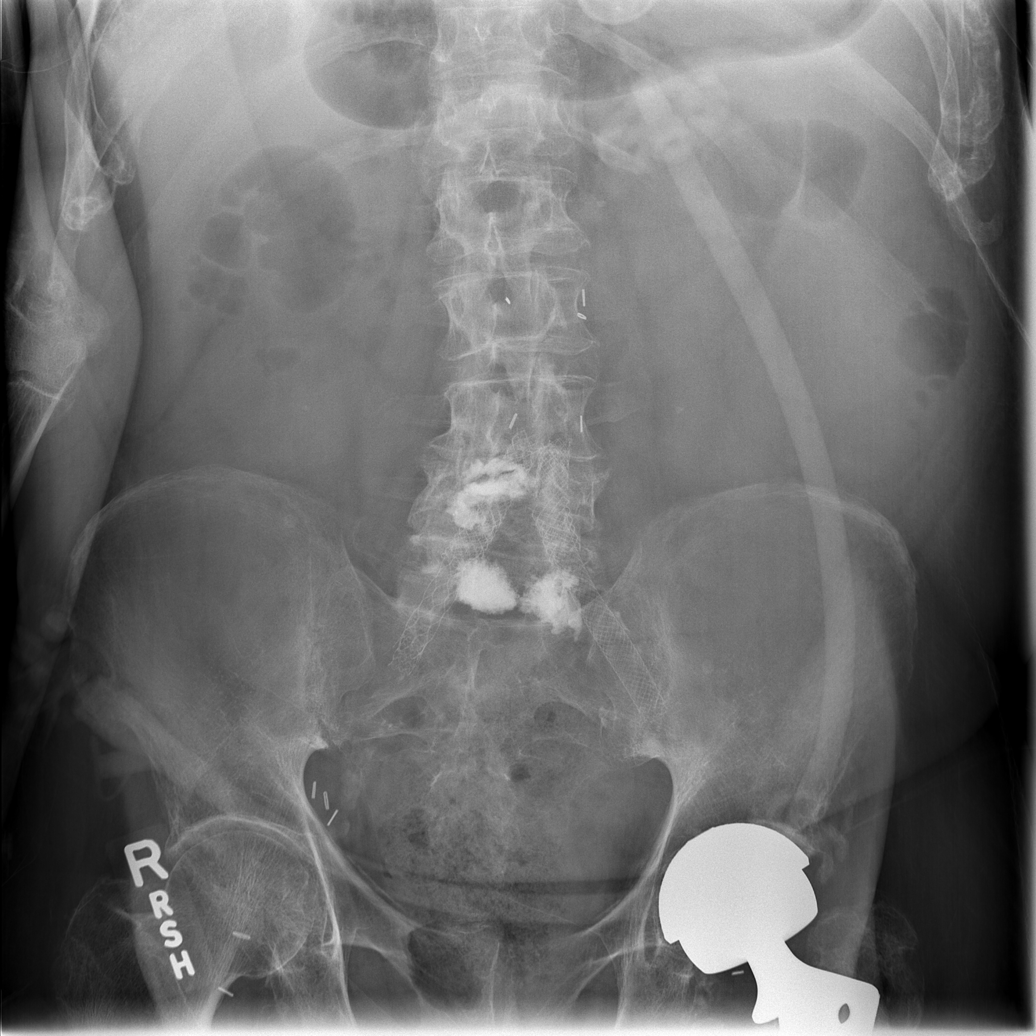

[t abdomen supine (2 of 2)]
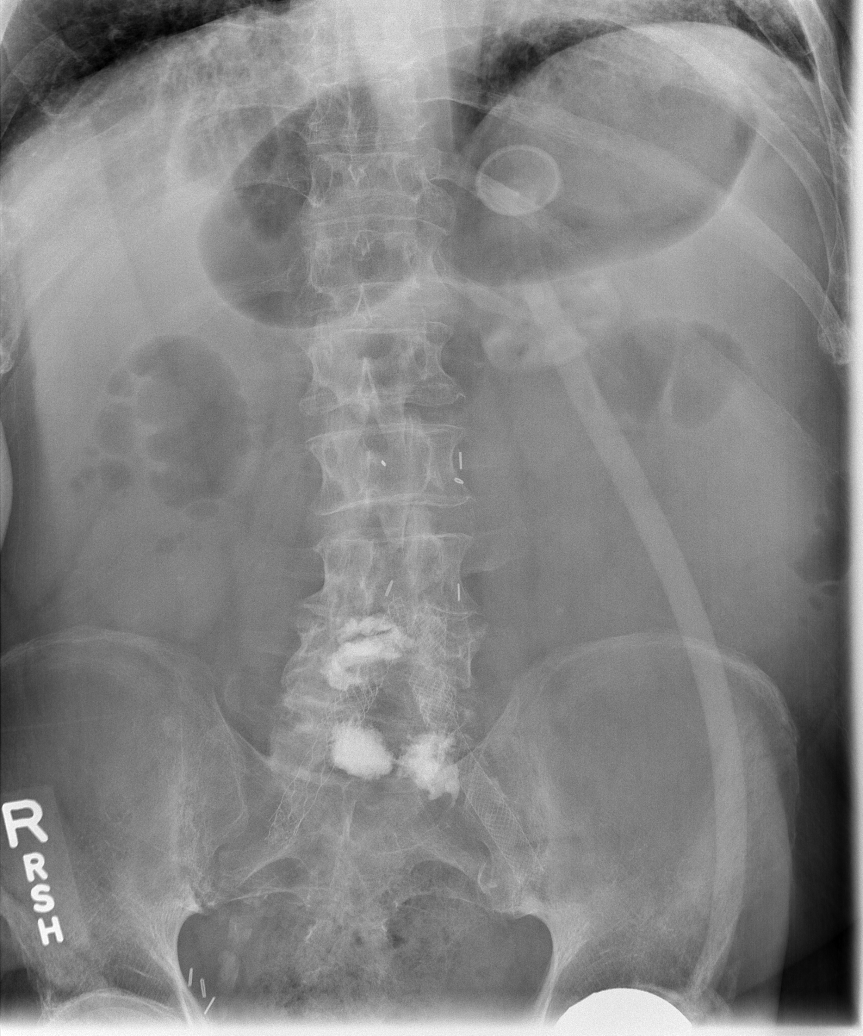

[4 of 4 positions shown; findings below may reference images not displayed]

FINDINGS: Gastrostomy is in place.  There are dilated fluid and air
filled loops of small intestine with a paucity of gas distal to
that.  The findings could reflect partial small bowel obstruction.
No free air.
IMPRESSION: Dilated fluid and air filled loops of proximal small bowel with a
paucity of air distal to that.  The findings could reflect partial
small bowel obstruction.  No free air.

## 2010-01-14 IMAGING — CR DG CHEST 2V
1 series · 1 of 1 positions shown · non-contrast
Comparison: 02/26/2008

CLINICAL DATA: Follow-up chest status.  Cough.

CHEST - 2 VIEW

[w chest lat]
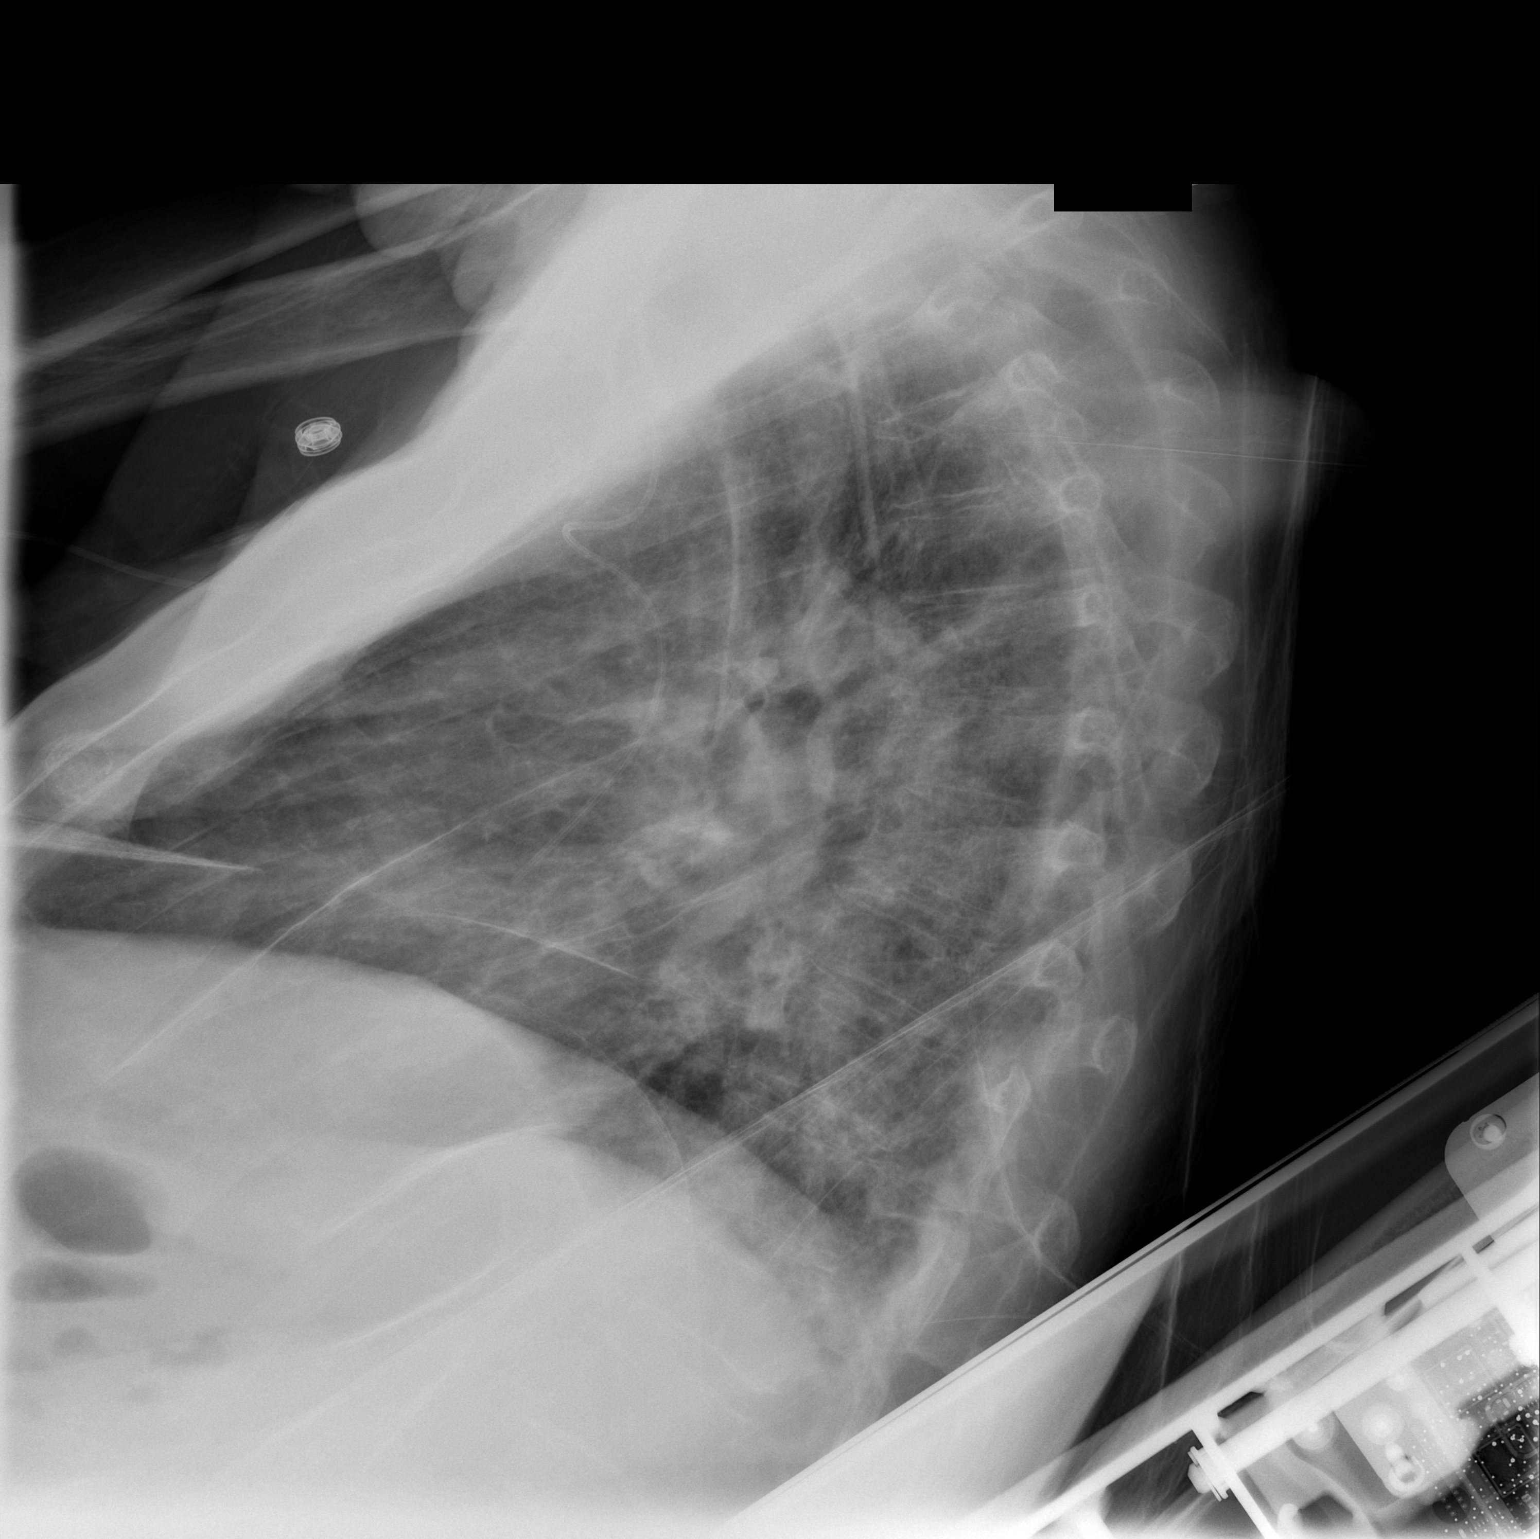

[1 of 1 positions shown; findings below may reference images not displayed]

FINDINGS: Right medial lower lung zone airspace opacity persist.
Findings are worrisome for pneumonia.  Cardiomegaly and pulmonary
vascular congestion.  No pleural effusions.  PICC line tip is near
the SVC - right atrial junction.
IMPRESSION: Right lower lung zone infiltrate persist.

## 2010-01-15 IMAGING — CR DG ABDOMEN 1V
2 series · 2 of 2 positions shown · non-contrast
Comparison: 03/01/2008

CLINICAL DATA: Small bowel obstruction.

ABDOMEN - 1 VIEW

[t abdomen supine (1 of 2)]
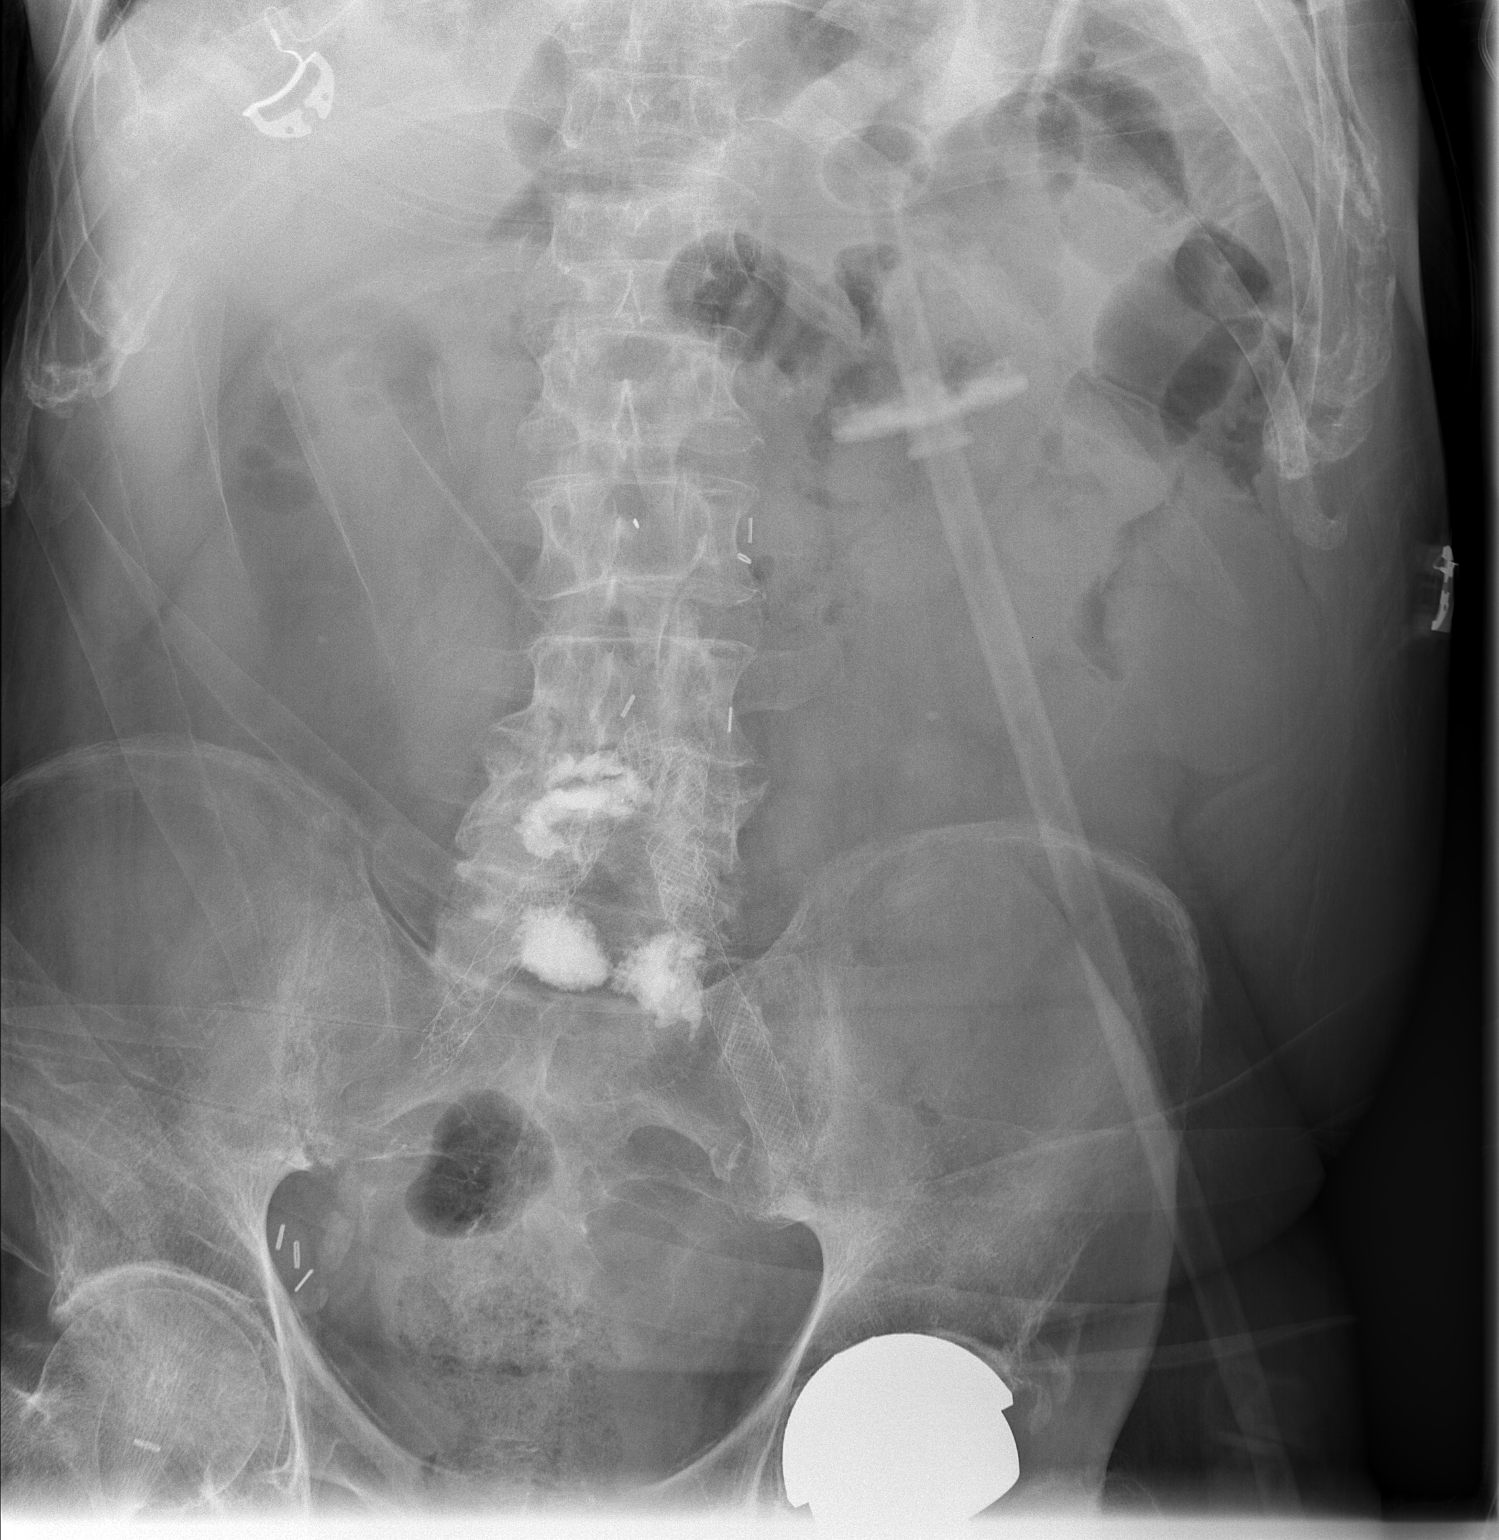

[t abdomen supine (2 of 2)]
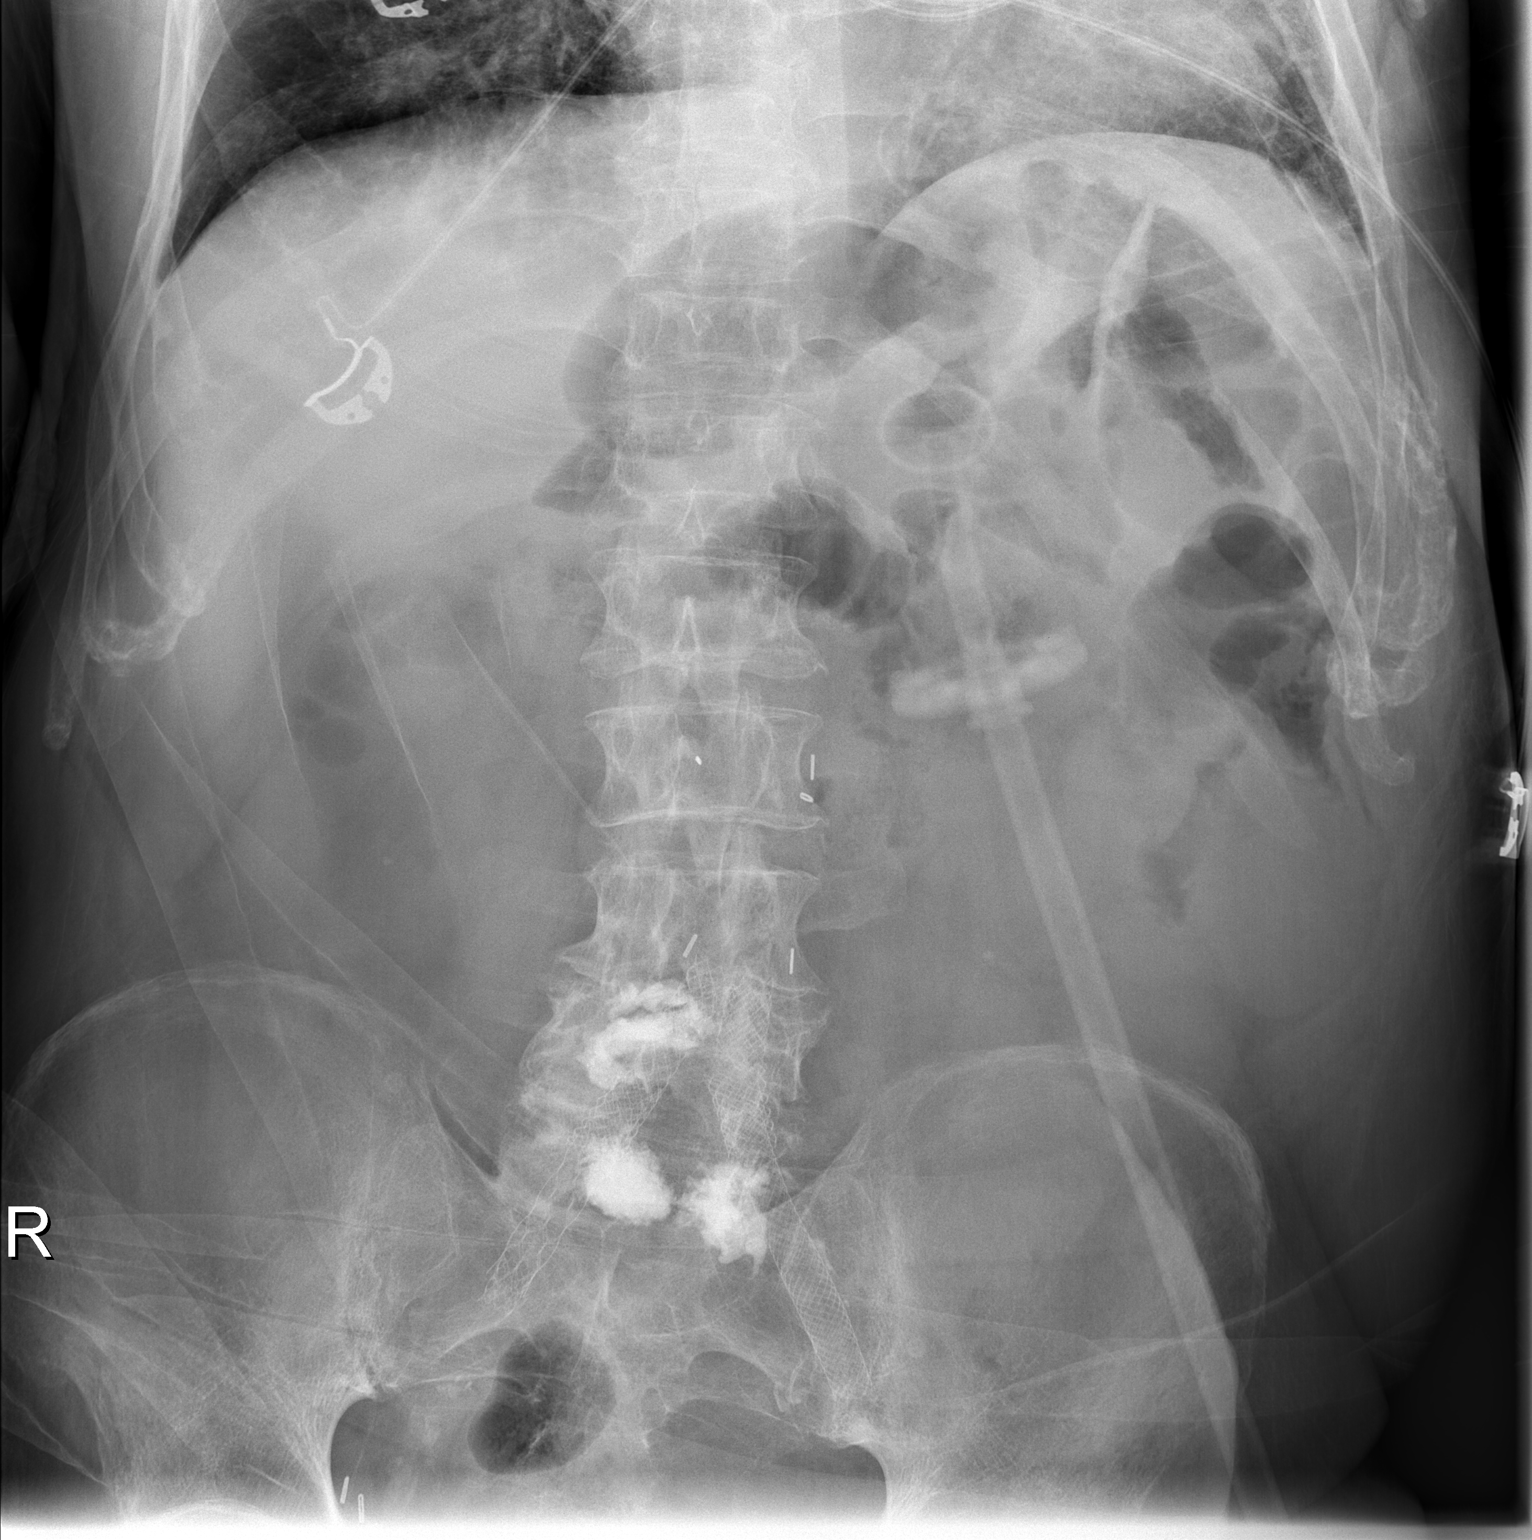

[2 of 2 positions shown; findings below may reference images not displayed]

FINDINGS: The feeding gastrostomy tube appears stable.  There are
scattered loops of small bowel with air but no significant
distention or air fluid levels.  There is some air in the left
colon and stool in the rectosigmoid area.  The soft tissue shadows
of the abdomen are maintained.  Stable surgical changes.  The lung
bases are grossly clear.
IMPRESSION: 1.  No significant change in bowel gas pattern.  There are air
filled loops of small bowel without significant distention.  No
findings for perforation.

## 2010-01-19 IMAGING — CT CT ABDOMEN W/ CM
2 of 5 series · 16 of 46 positions shown, 18 images · IV contrast (agent unspecified)
Comparison: Abdominal radiographs 03/03/2008 and earlier.  CT
02/20/2008.

CT ABDOMEN

CLINICAL DATA: 72-year-old male with bowel obstruction, nausea and
vomiting.

CT ABDOMEN AND PELVIS WITH CONTRAST
TECHNIQUE: Multidetector CT imaging of the abdomen and pelvis was
performed using the standard protocol following bolus
administration of intravenous contrast.
Contrast: 100 ml 0mnipaque-PCC.

[Series 2: routine abdomen · axial · 0.73mm/px · z∈[-440,-0]mm · 13 of 98 slices shown, 15 images]
[im 5/98  soft-tissue]
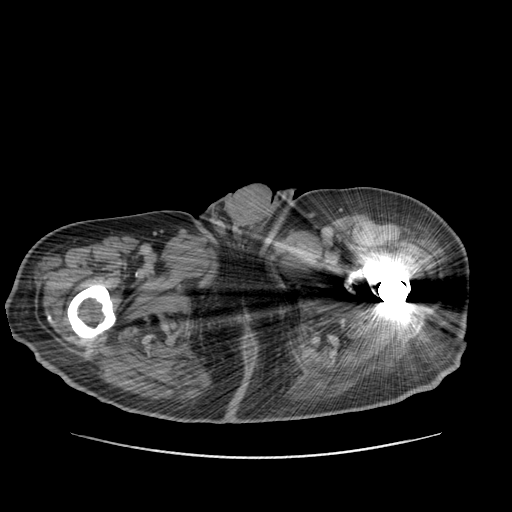
[im 5/98  bone]
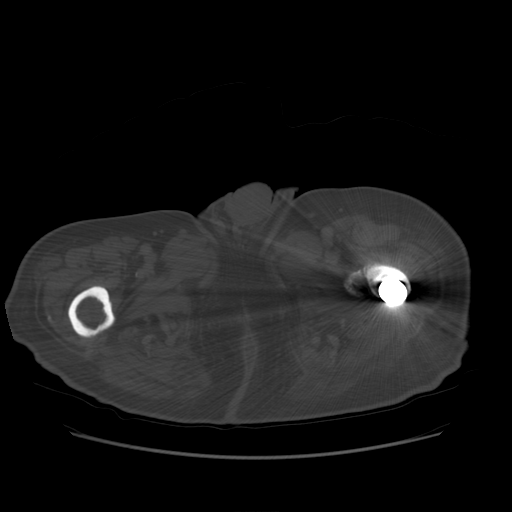
[im 15/98  soft-tissue]
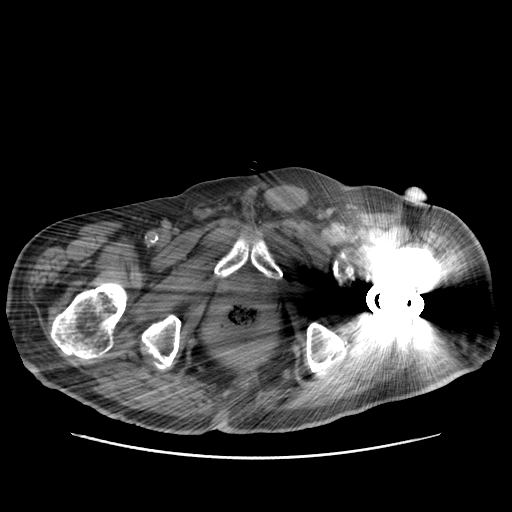
[im 20/98  soft-tissue]
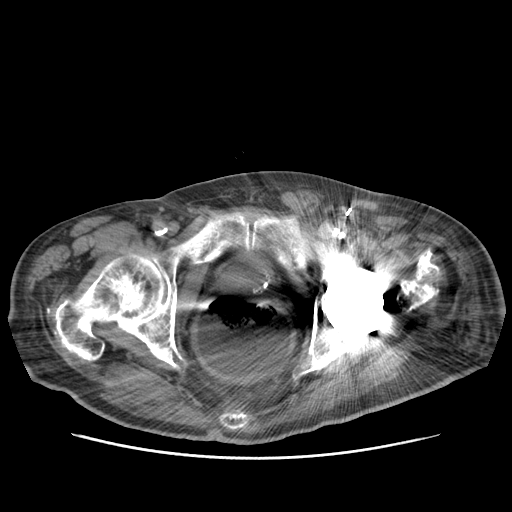
[im 30/98  soft-tissue]
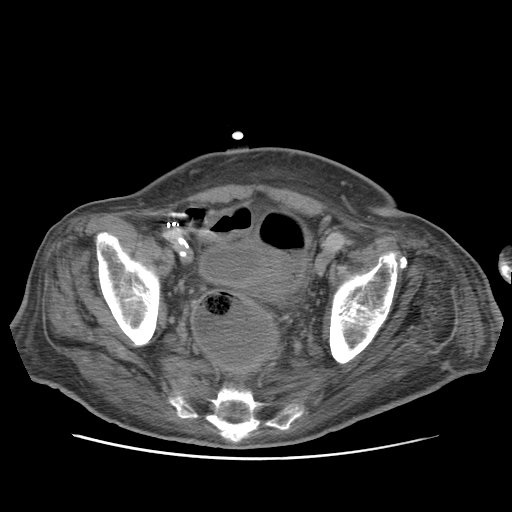
[im 34/98  soft-tissue]
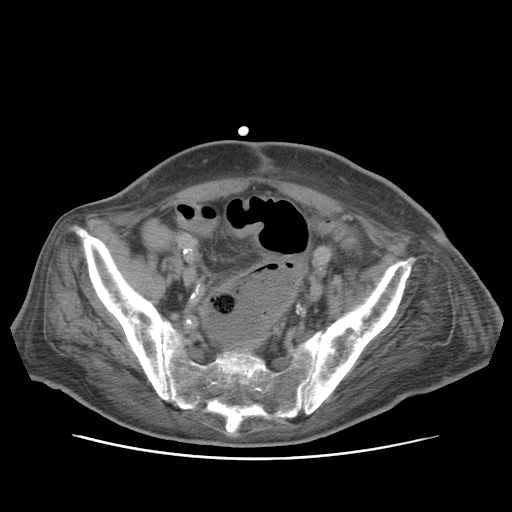
[im 44/98  soft-tissue]
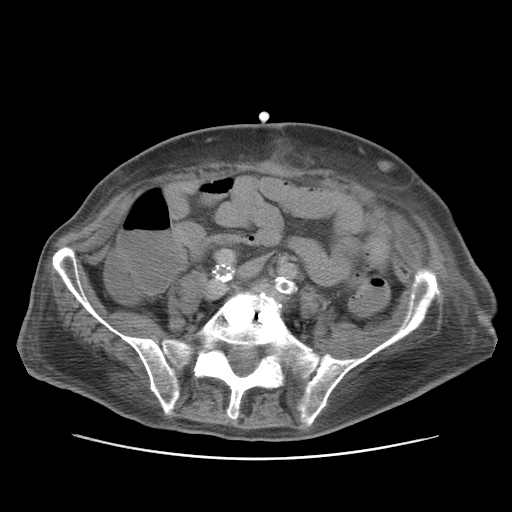
[im 49/98  soft-tissue]
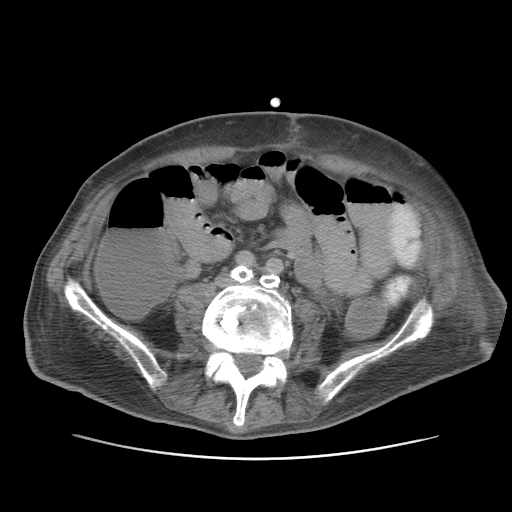
[im 54/98  soft-tissue]
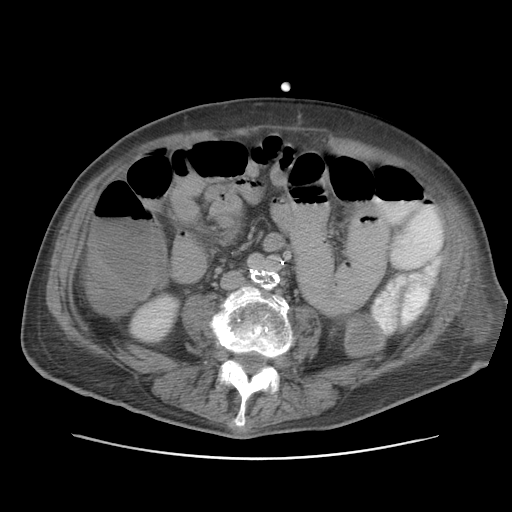
[im 64/98  soft-tissue]
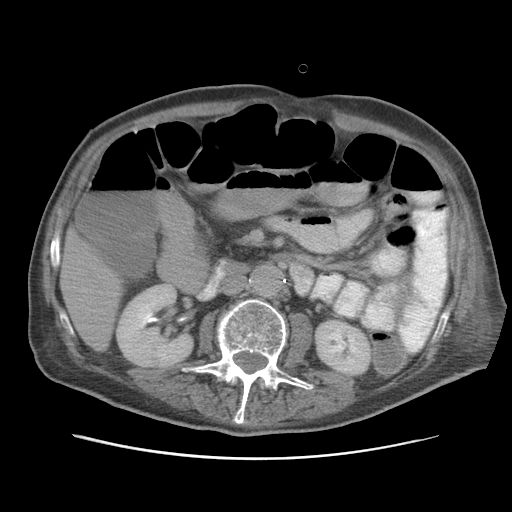
[im 64/98  bone]
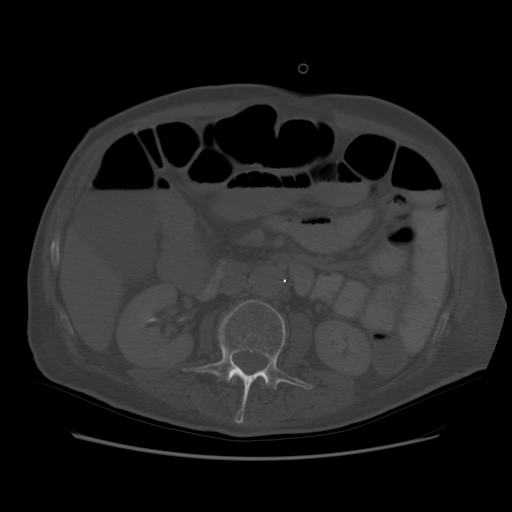
[im 68/98  soft-tissue]
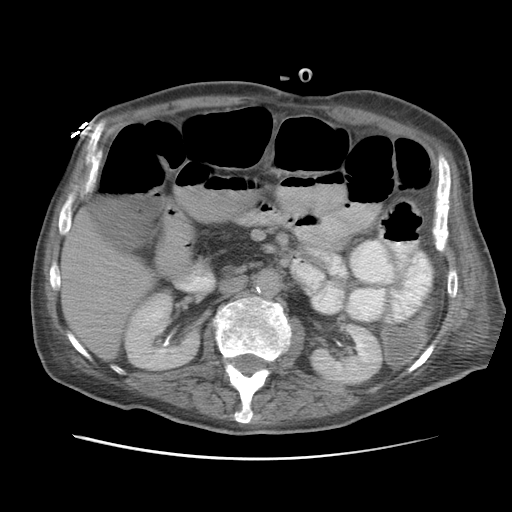
[im 78/98  soft-tissue]
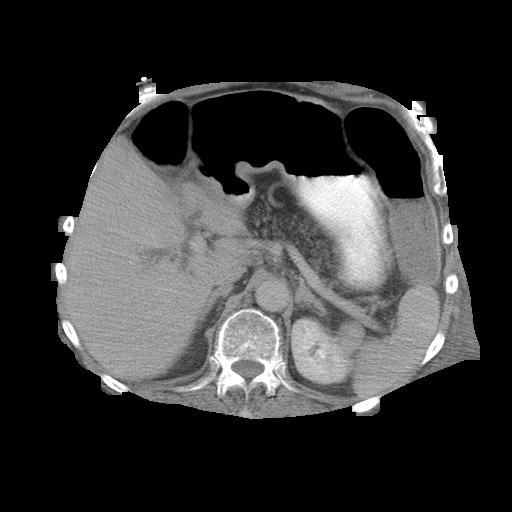
[im 83/98  soft-tissue]
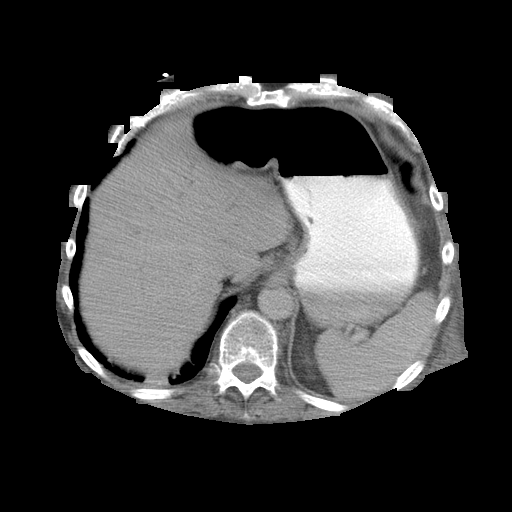
[im 93/98  soft-tissue]
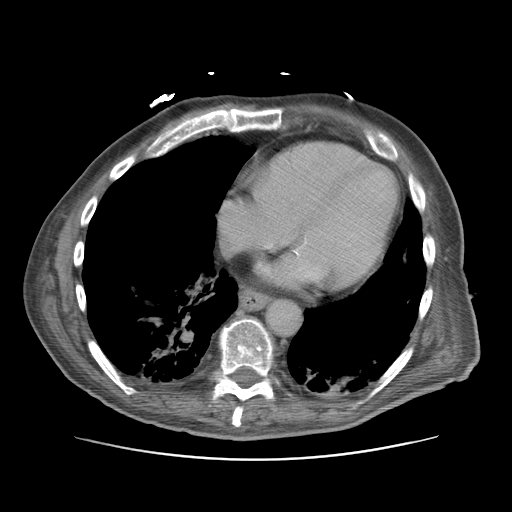

[Series 401: cor · coronal · 0.96mm/px · 3 of 158 slices shown]
[im 53/158  soft-tissue]
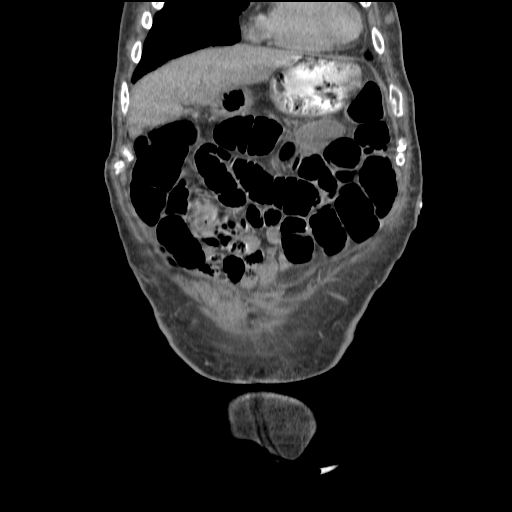
[im 70/158  soft-tissue]
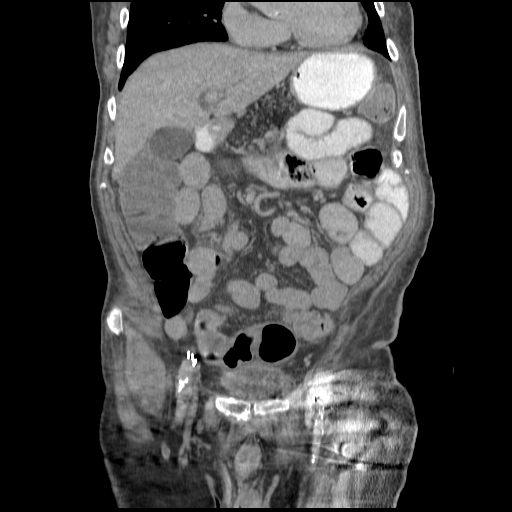
[im 88/158  soft-tissue]
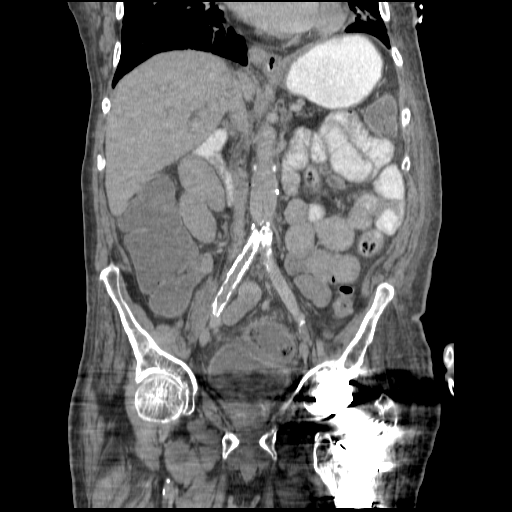

[16 of 46 positions shown; findings below may reference images not displayed]

FINDINGS: Stable lower lumbar vertebral body fractures status post
vertebroplasty.  A focal defect through the L3 superior endplate on
series 400 image 92 is similar to the prior exam and appears
related to a Schmorl's node on the axial images.  Mild T11 loss of
height is stable.  No definite acute osseous abnormality.
Increased lower lobe and lingular atelectasis.  Stable liver with
intra and extrahepatic biliary ductal prominence.  Spleen, adrenal
glands, stomach, duodenum and proximal small bowel loops are within
normal limits.  The pancreas is completely fatty infiltrated as
before.  Nonobstructed kidneys.  Stable sub centimeter low density
areas in the right kidney.  Gallbladder is stable and within normal
limits.  Stable postoperative appearance of the abdominal aorta
from aorto-iliac bypass. Mild left lateral abdomen/flank
subcutaneous stranding edema.

Interval improved appearance of small bowel loops since the prior
CT.  Oral contrast was administered and has not yet reached the
terminal ileum or cecum.  Jejunal and ileal loops have a more
uniform caliber throughout the abdomen.  The loops measure up to
3.3 cm in diameter.  A redundant sigmoid colon is evident and
discussed further in the pelvis section, see below.  A percutaneous
gastrostomy tube is stable.  No pneumoperitoneum.  Mild diffuse
mesenteric haziness.  There is fluid throughout the proximal colon.
IMPRESSION: 1.  Interval improved appearance of bowel loops suggesting
decreased small bowel obstruction.  Oral contrast has not yet
reached the distal small bowel or colon, and numerous loops remain
at the upper limits of normal to mildly dilated.
2.  The bilateral lower lobe atelectasis.
3.  Stable lower lumbar vertebral body fractures status post
vertebroplasty.  L3 superior endplate deformity is favored to
represent a Schmorl's node.  If there are symptoms referable to the
lumbar spine, MRI would evaluate further.

CT PELVIS
FINDINGS: Redundant sigmoid colon.  Mild sigmoid and rectal wall
thickening despite distention with fluid.  Some stool is also
contained in the distal colon.  No free fluid.  Stable
postoperative changes status post bilateral aorto-iliac bypass and
left total hip arthroplasty. No acute osseous abnormality
identified.  Fluid tracks in the left inguinal canal to the
scrotum, unchanged.
IMPRESSION: 1.  Rectosigmoid colonic wall thickening despite distention with
fluid.  Proctitis is not excluded.
2.  Otherwise no acute findings in the pelvis.

## 2010-01-22 IMAGING — CR DG ABDOMEN 1V
1 series · 1 of 1 positions shown · non-contrast
Comparison: 03/03/2008 and 03/07/2008

CLINICAL DATA: Follow-up bowel obstruction.  Nausea and vomiting.

ABDOMEN - 1 VIEW

[view not recorded]
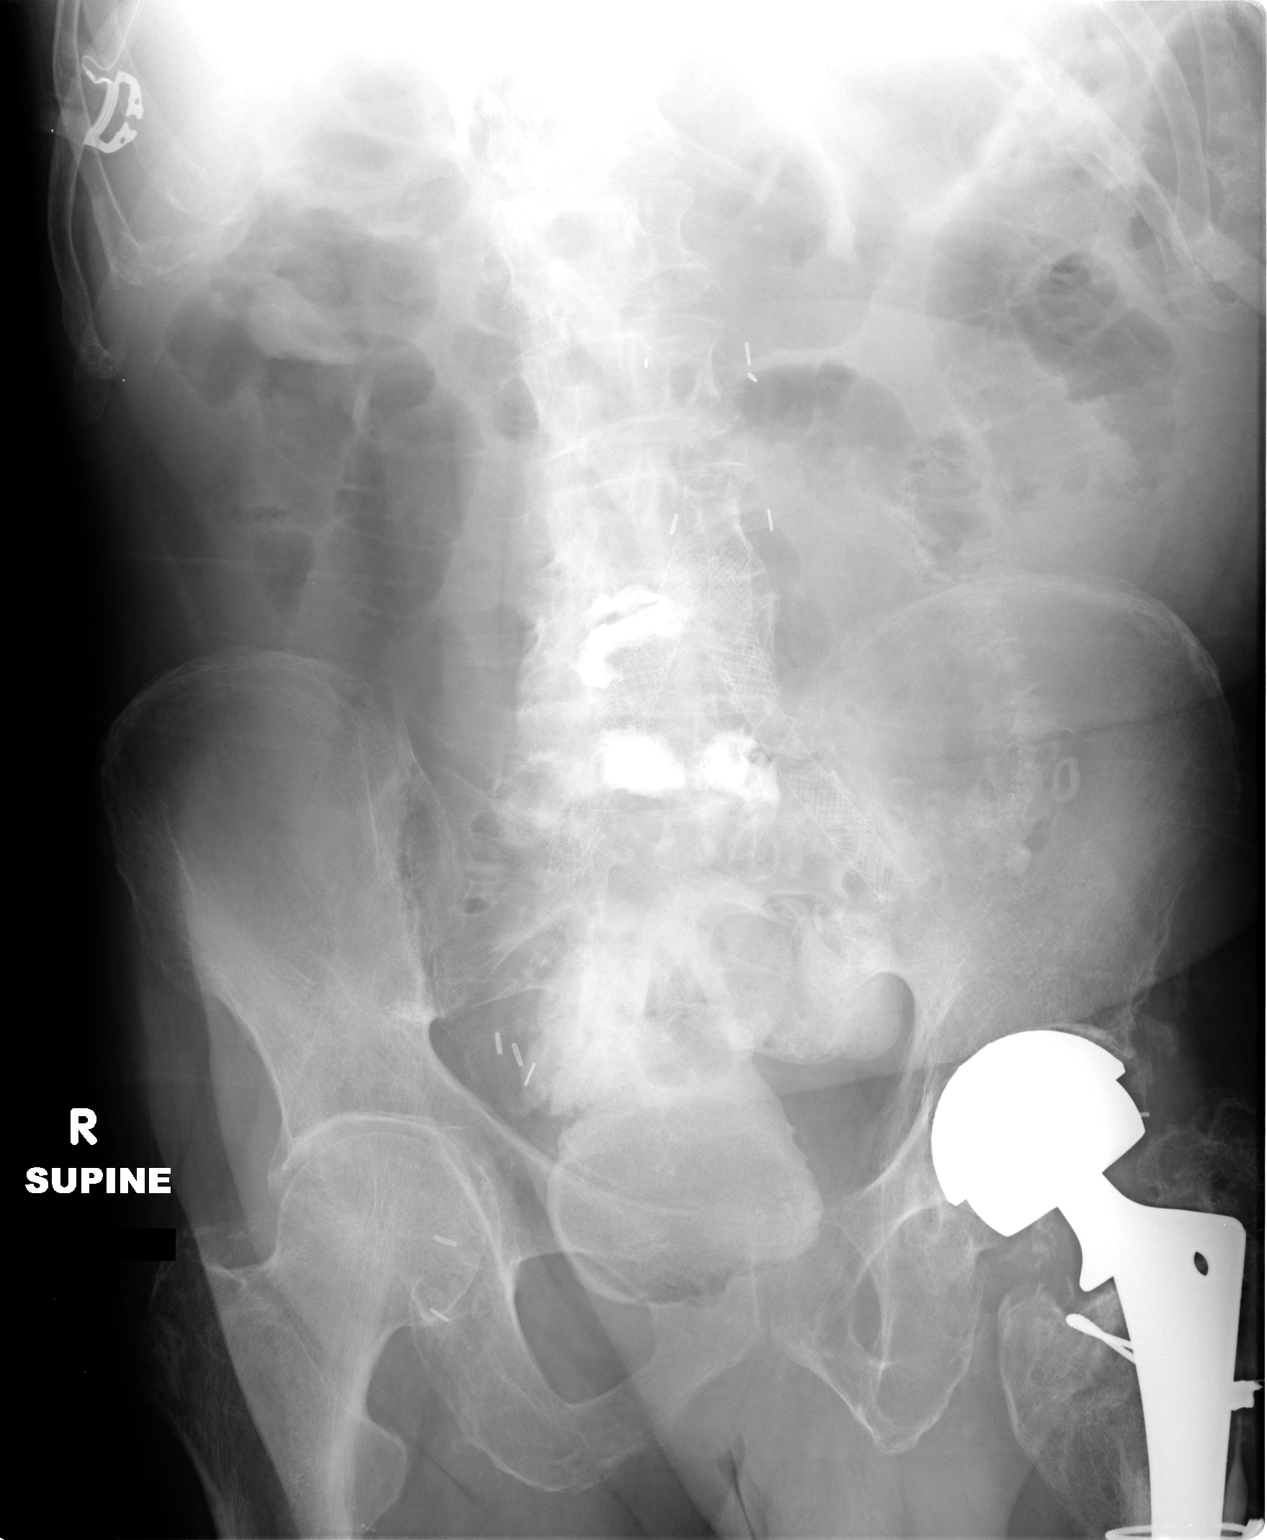

[1 of 1 positions shown; findings below may reference images not displayed]

FINDINGS: Contrast from recent CT is now seen in the rectosigmoid
colon.  Mild gaseous distention of small bowel loops and transverse
colon is seen which may be due to ileus or a partial small bowel
obstruction.  The sigmoid colon diverticulosis is noted.  Bilateral
iliac artery stents are seen as well as left hip prosthesis.
IMPRESSION: Mild dilatation of small bowel loops and transverse colon, with
contrast now seen in the rectosigmoid colon.  This may be due to an
ileus or improving partial small bowel obstruction.

## 2010-03-15 IMAGING — CT CT ABDOMEN W/ CM
2 of 5 series · 13 of 32 positions shown, 18 images · IV contrast (water/omni  & 100 ML OMNI 300)
Comparison: 03/07/2008

CT ABDOMEN

CLINICAL DATA: 72-year-old with vomiting and diarrhea.

CT ABDOMEN AND PELVIS WITH CONTRAST
TECHNIQUE: Multidetector CT imaging of the abdomen and pelvis was
performed using the standard protocol following bolus
administration of intravenous contrast.
Contrast: 100 ml Bmnipaque-HOO

[Series 2: routine abdomen · axial · 0.90mm/px · z∈[-311,+14]mm · 6 of 91 slices shown, 11 images]
[im 13/91  soft-tissue]
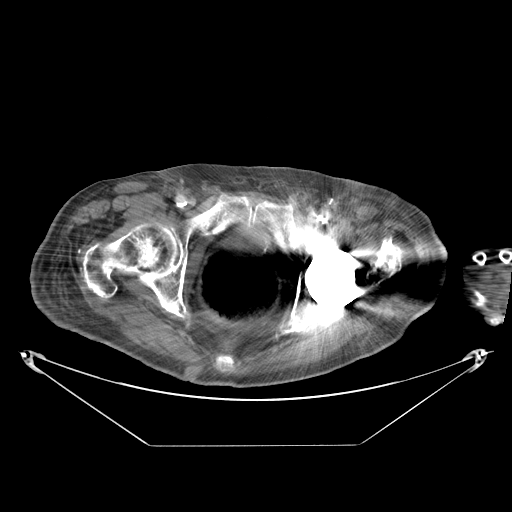
[im 13/91  bone]
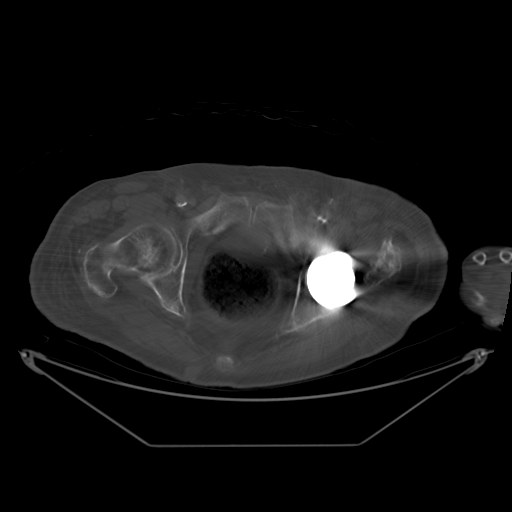
[im 26/91  soft-tissue]
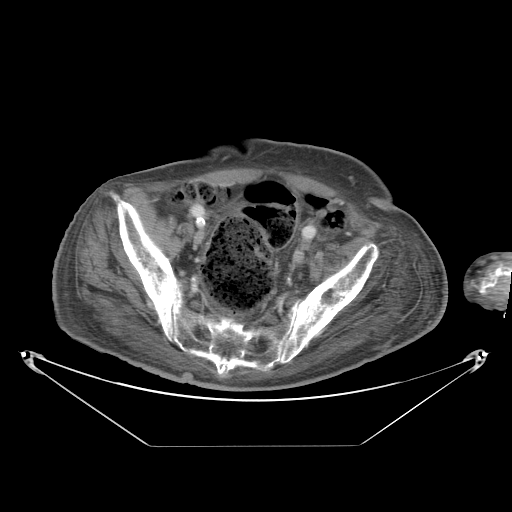
[im 39/91  soft-tissue]
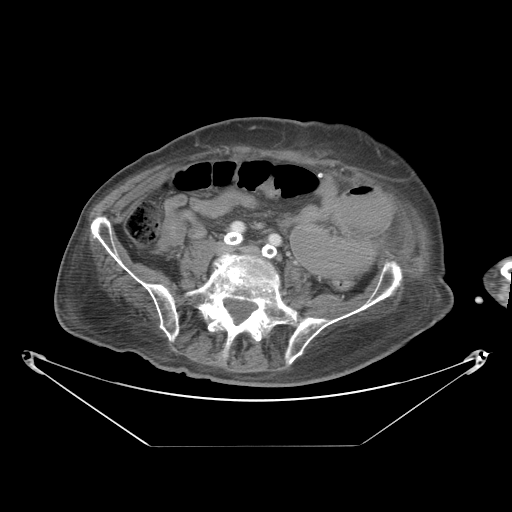
[im 39/91  lung]
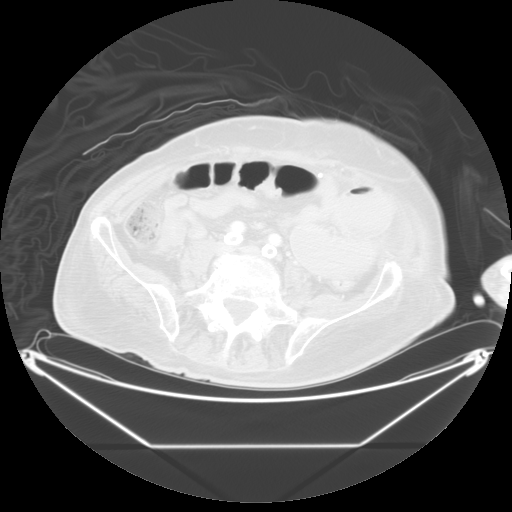
[im 52/91  soft-tissue]
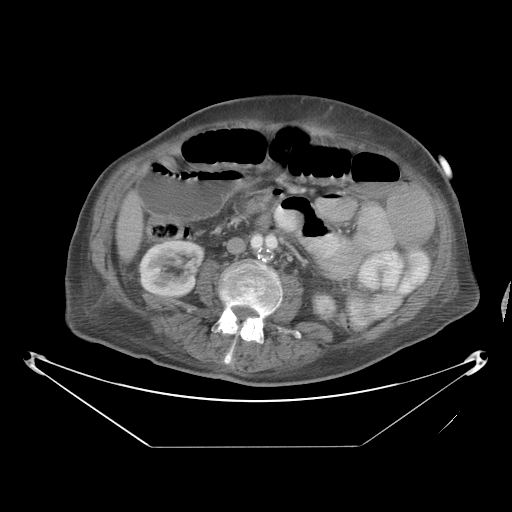
[im 52/91  lung]
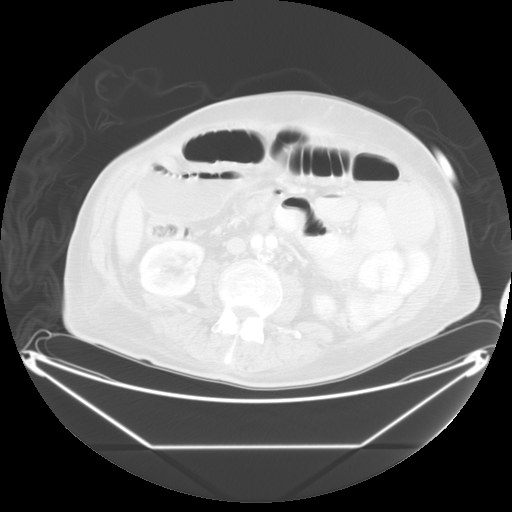
[im 65/91  soft-tissue]
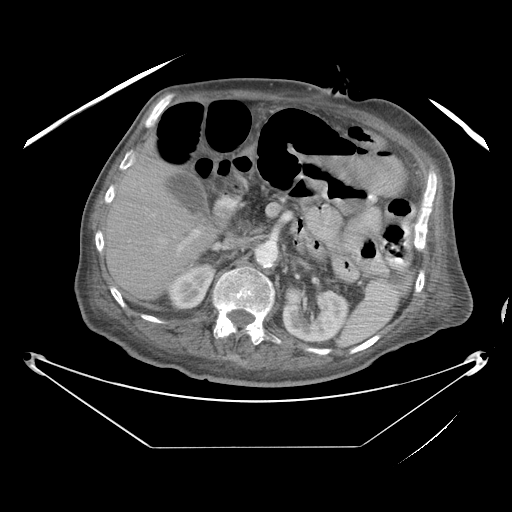
[im 65/91  lung]
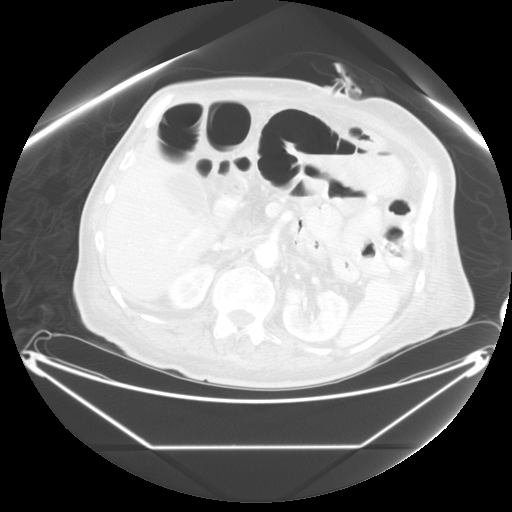
[im 78/91  soft-tissue]
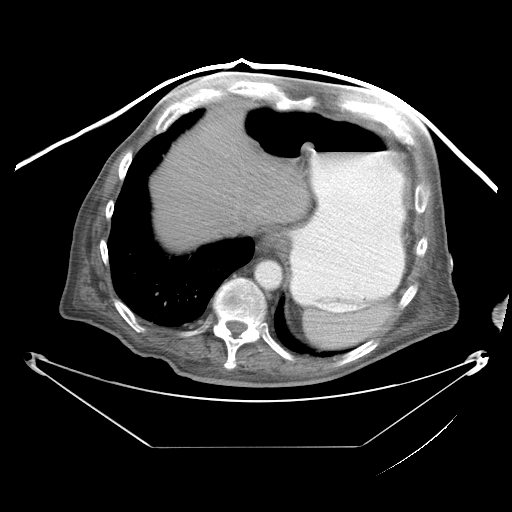
[im 78/91  lung]
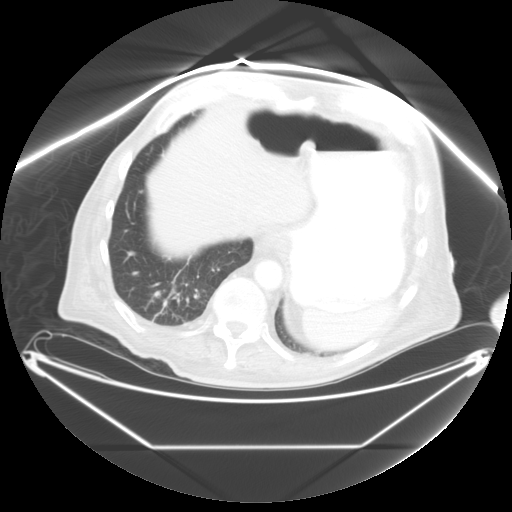

[Series 400: sag · sagittal · 0.90mm/px · 7 of 133 slices shown]
[im 14/133  soft-tissue]
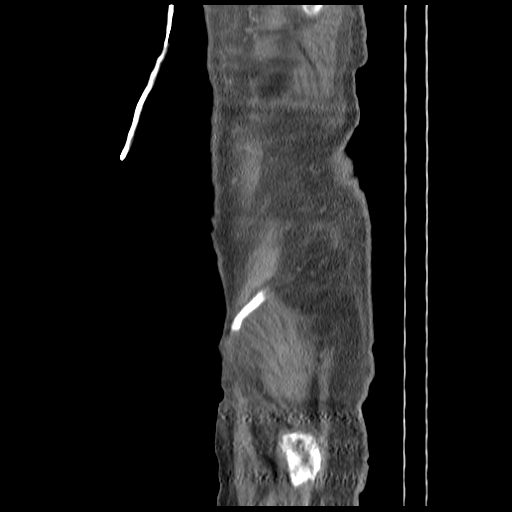
[im 27/133  soft-tissue]
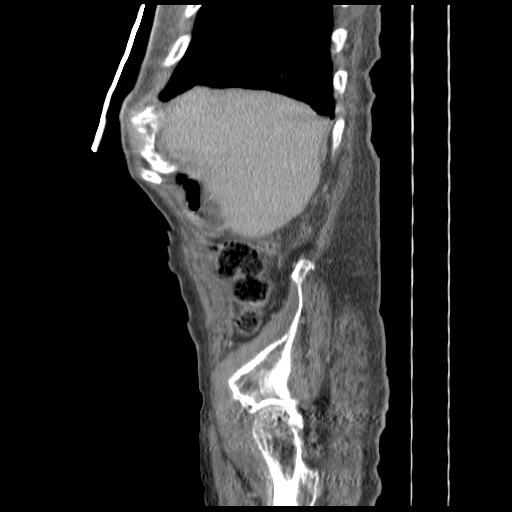
[im 40/133  soft-tissue]
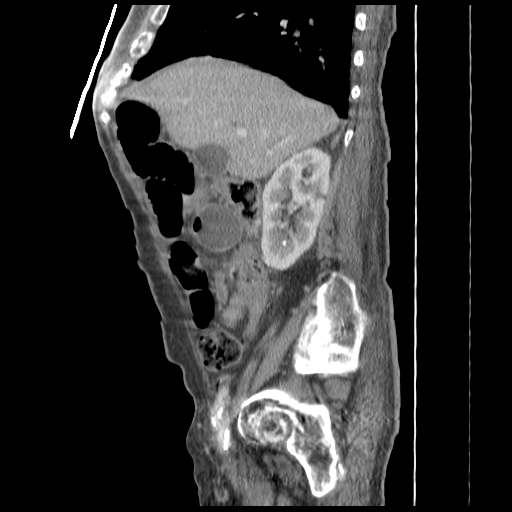
[im 53/133  soft-tissue]
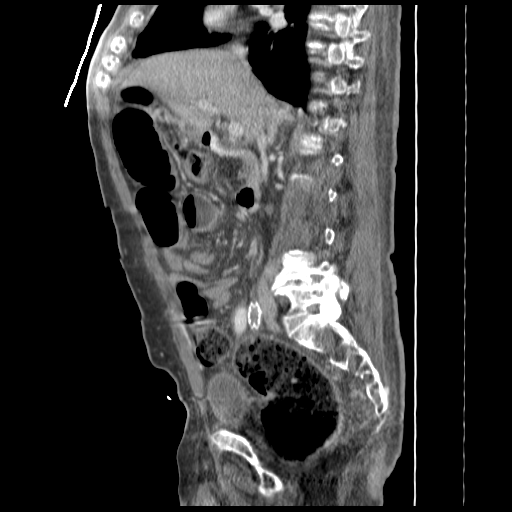
[im 80/133  soft-tissue]
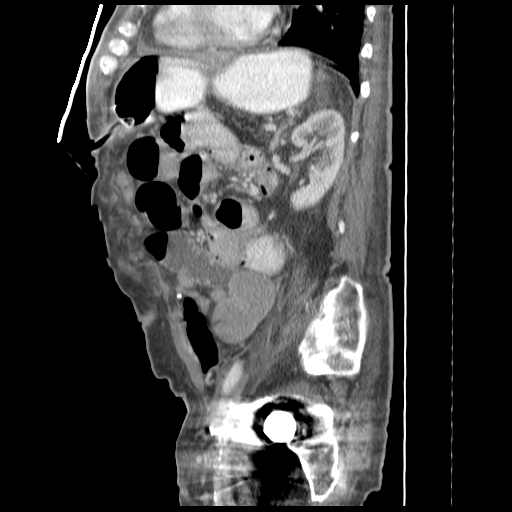
[im 93/133  soft-tissue]
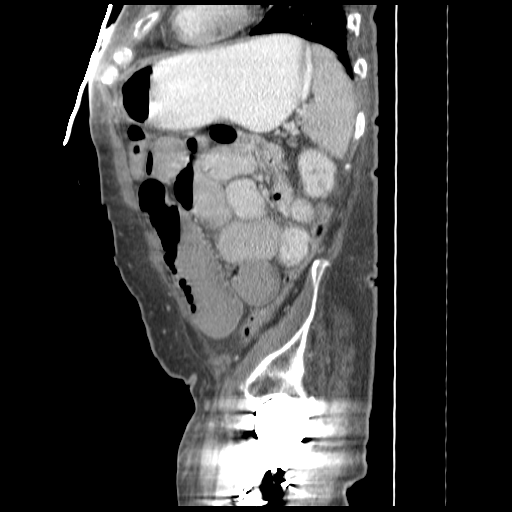
[im 106/133  soft-tissue]
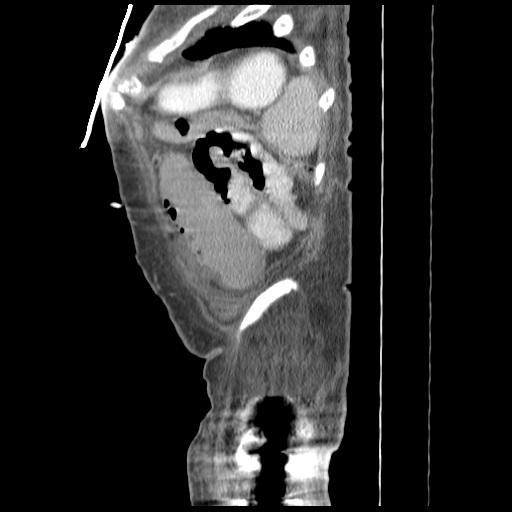

[13 of 32 positions shown; findings below may reference images not displayed]

FINDINGS: Lung bases demonstrate interstitial thickening, probably
related to chronic changes and atelectasis.  The lung findings are
similar to the previous examination.  Question a small nodule in
the left lung base which is unchanged and measures close to 4 mm.
The patient has a gastrostomy tube present.  There is no evidence
to suggest free intraperitoneal air.  There is a stable appearance
of the liver, portal venous system and gallbladder.  The patient
has a small amount of pericardial fluid that appears stable.
Punctate density in the lower pole of the right kidney suggestive
for a nonobstructive stone.  There is a stable appearance of the
adrenal tissue and kidneys.  Evidence for bilateral renal cortical
cysts.  There is diffuse fat infiltration of the pancreas.  Again
noted is a small ventral hernia containing fat.  The patient is
status post an aortobifemoral bypass.  There is flow in the aortic
graft and bilateral limbs.  There is dilatation of the proximal
small bowel loops.  The distal small bowel loops are decompressed.
The patient has stool throughout the colon and a large amount stool
in the distal colon.  Possible transition point associated with the
small bowel dilatation in the mid abdomen, which is best seen on
the coronal reformats, sequence 401 image 40.  There is a bone
cement in the L5 and L4 vertebral bodies consistent with previous
vertebral body augmentation procedures.
IMPRESSION: Dilated loops of small bowel consistent with a moderate to high
grade small bowel obstruction.  The transition point is thought to
be in the mid abdomen. There is no significant free fluid within
the abdomen.

Status post aortic bypass procedure.

 Nonobstructive right kidney stone.

CT PELVIS
FINDINGS: The patient is status post a left hip replacement which
has a large amount of artifact in this area.  Poor characterization
of the left groin vascular structures.  There is a large amount of
stool in the distal sigmoid colon and rectum.  No significant free
fluid in the pelvis.  No gross abnormality to the prostate or
urinary bladder.
IMPRESSION: Large amount of stool in the rectum.

Left hip replacement.

## 2010-03-15 IMAGING — CR DG ABDOMEN ACUTE W/ 1V CHEST
1 series · 1 of 1 positions shown · non-contrast
Comparison: 03/10/2008

CLINICAL DATA: Abdominal pain and vomiting.

ACUTE ABDOMEN SERIES (ABDOMEN 2 VIEW & CHEST 1 VIEW)

[view not recorded]
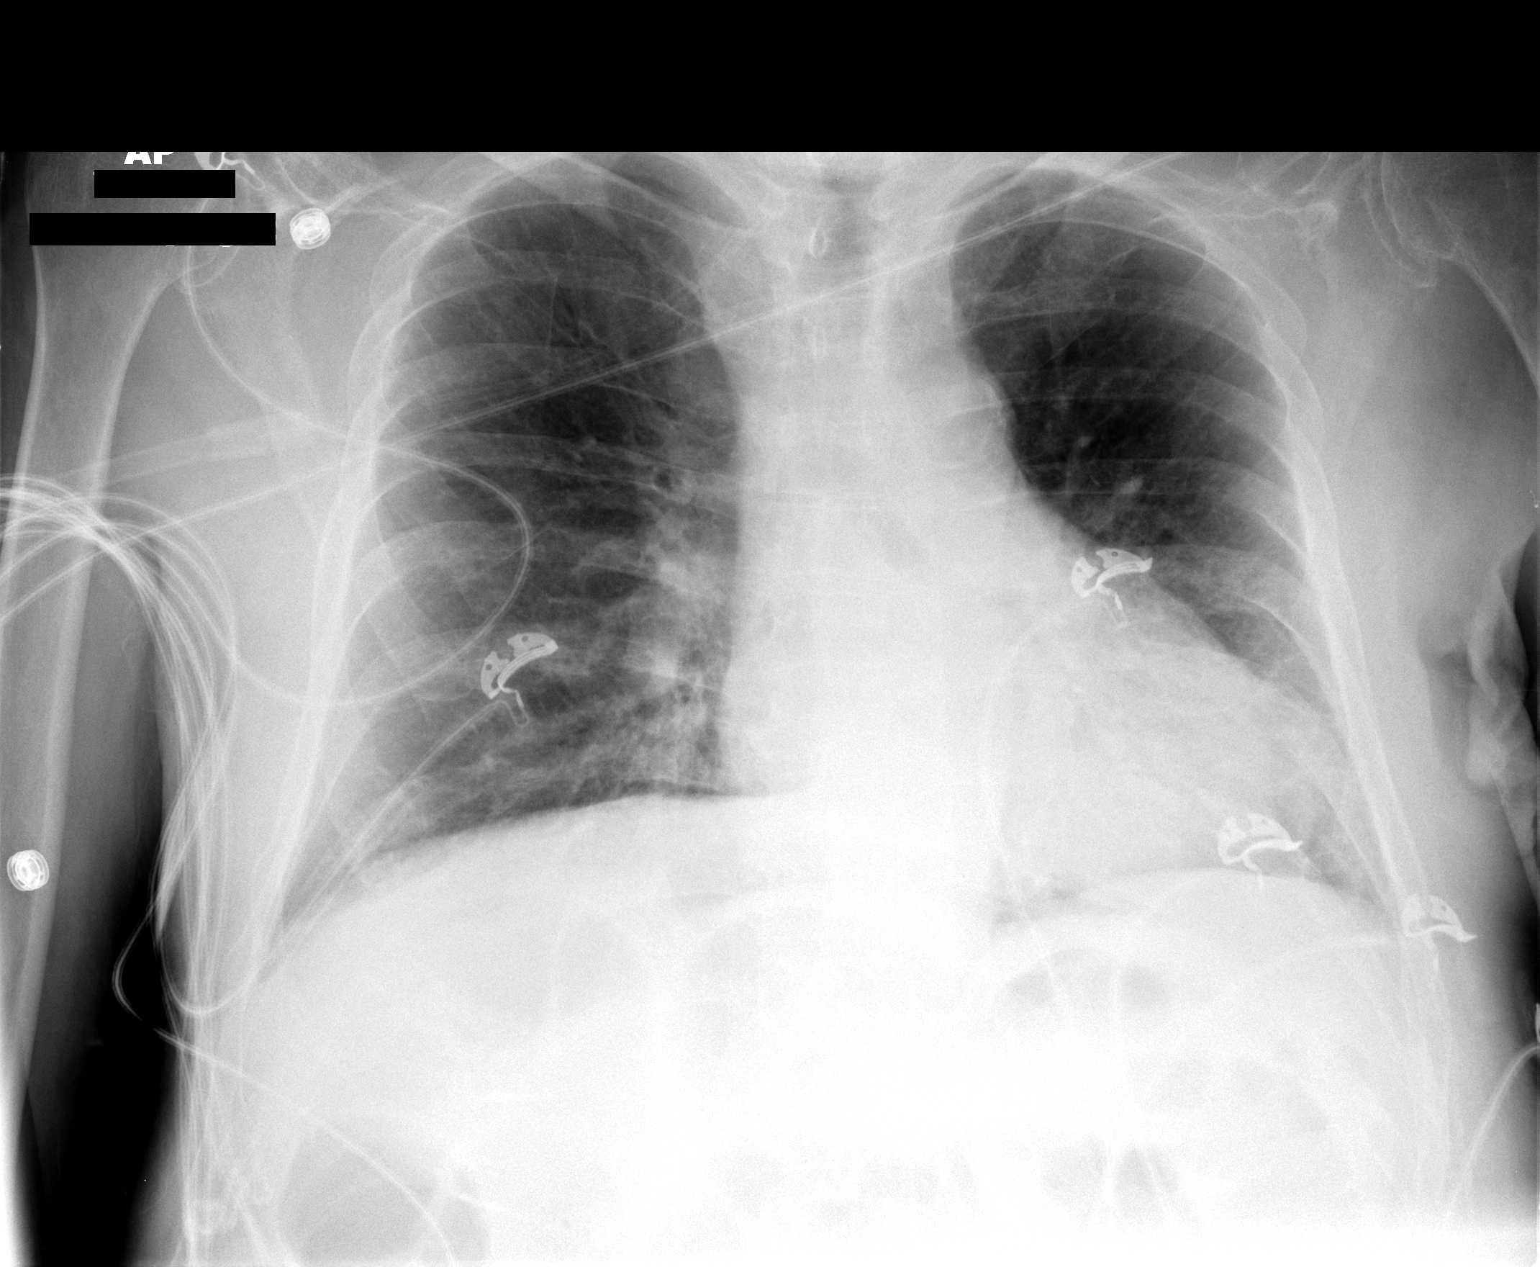

[1 of 1 positions shown; findings below may reference images not displayed]

FINDINGS: Multiple dilated small bowel loops are seen containing
air fluid levels.  There is a paucity of colonic gas.  This is
consistent with a mid to distal small bowel obstruction.

A gastrostomy tube is seen.  There is no evidence of free air.
Bilateral iliac artery stents are noted.

Heart size is within normal limits.  Both lungs are clear.
IMPRESSION: 1.  Mid to distal small bowel obstruction.
2.  No active cardiopulmonary disease.

## 2010-03-15 IMAGING — CR DG CHEST 2V
1 series · 1 of 1 positions shown · non-contrast
Comparison: 03/02/2008

CLINICAL DATA: Weakness, shortness of breath

CHEST - 2 VIEW

[w chest lat *]
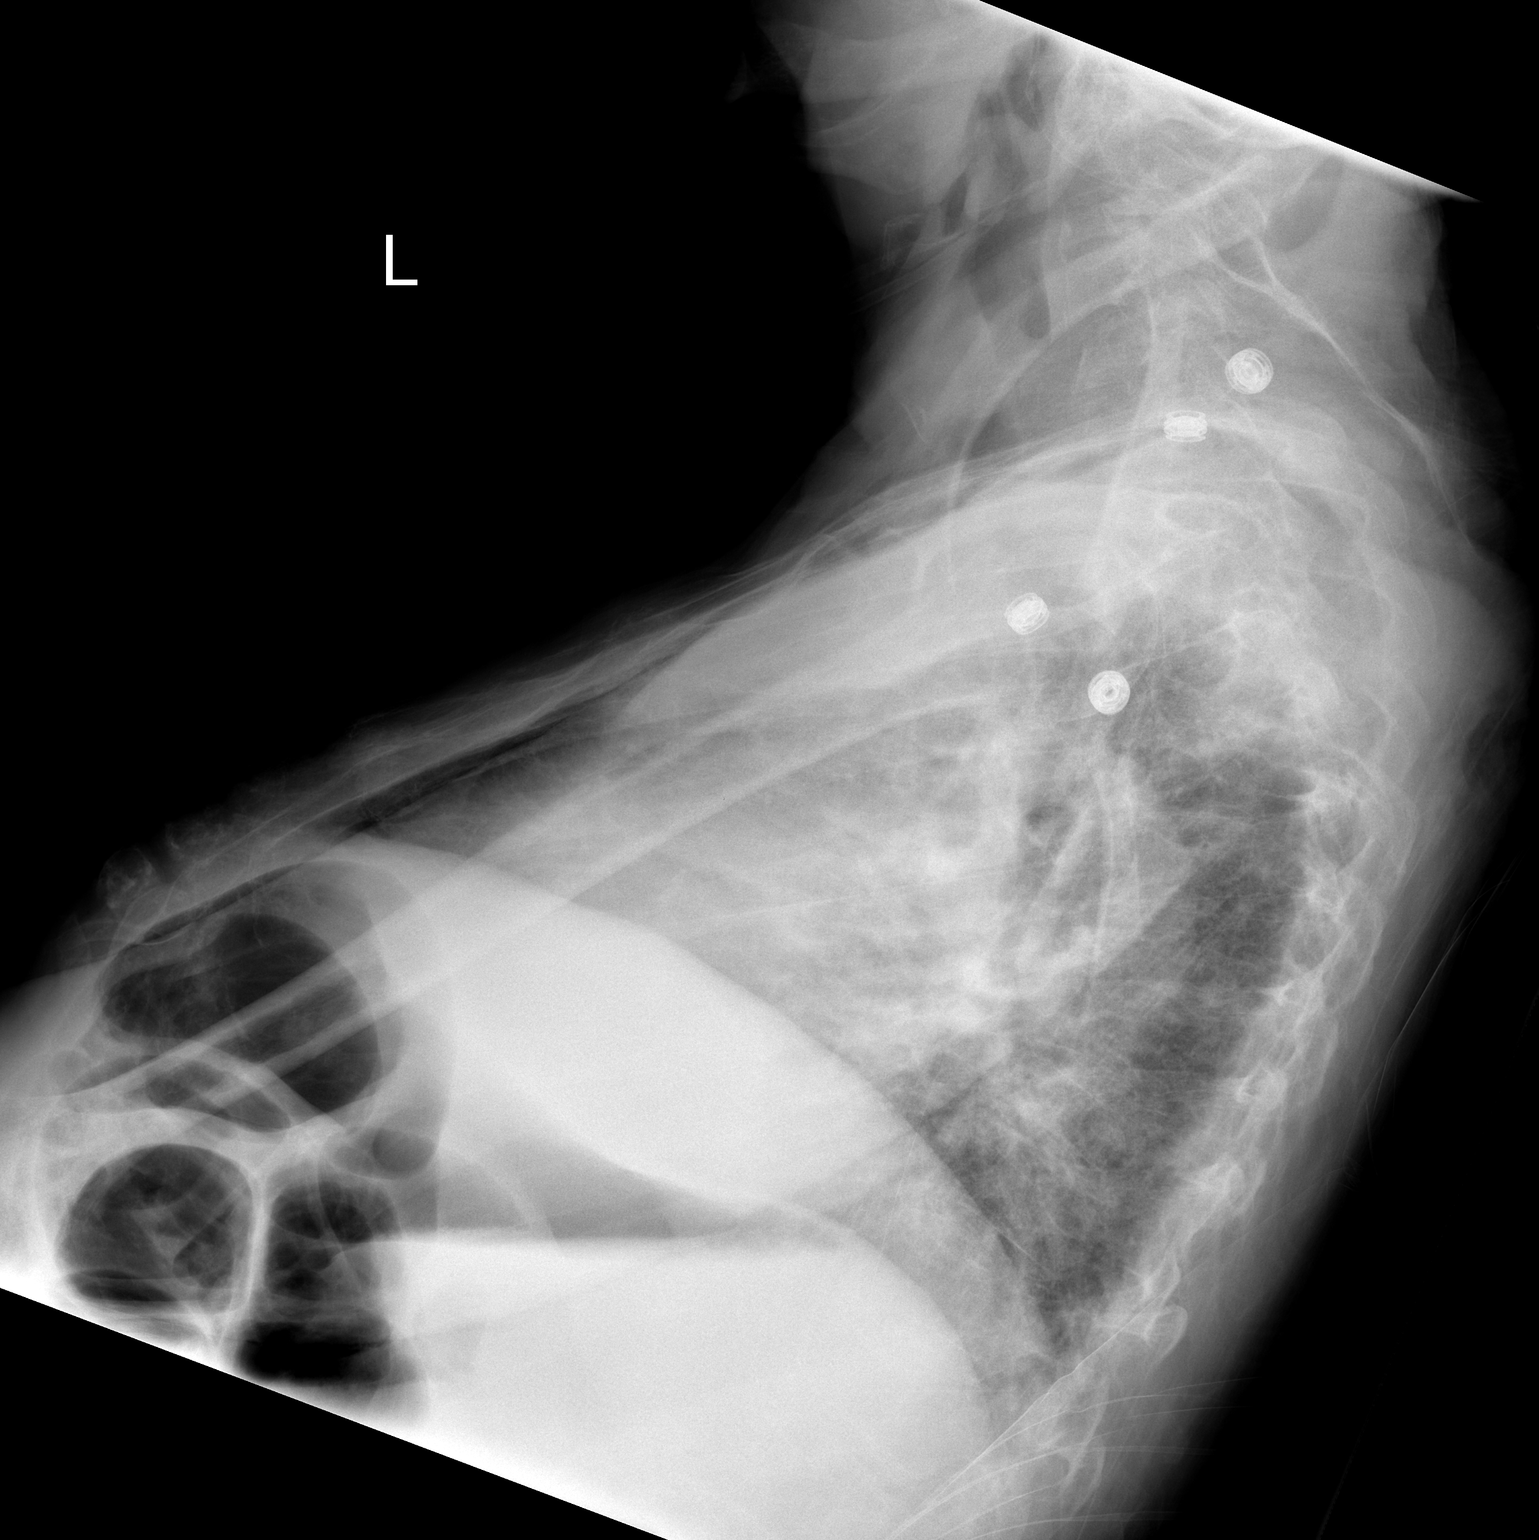

[1 of 1 positions shown; findings below may reference images not displayed]

FINDINGS: Lung volumes are extremely low with crowding of the
bronchovascular markings.  The arms overlie the chest on the
lateral view.  Chronically increased interstitial lung markings are
noted, no gross evidence for new focal pulmonary opacity.
Previously seen right lower lobe opacity is not apparent on today's
exam.  Bones are not well evaluated with this technique.
IMPRESSION: Extremely low lung volumes with prominent interstitial markings but
no focal opacity.  Recommend PA and lateral radiographs at full
inspiration in the department when the patient is clinically able,
if symptoms persist.

## 2010-03-16 IMAGING — CR DG ABDOMEN 1V
1 series · 1 of 1 positions shown · non-contrast
Comparison: 05/02/2007.

CLINICAL DATA: Followup small bowel obstruction.

ABDOMEN - 1 VIEW

[t abdomen supine]
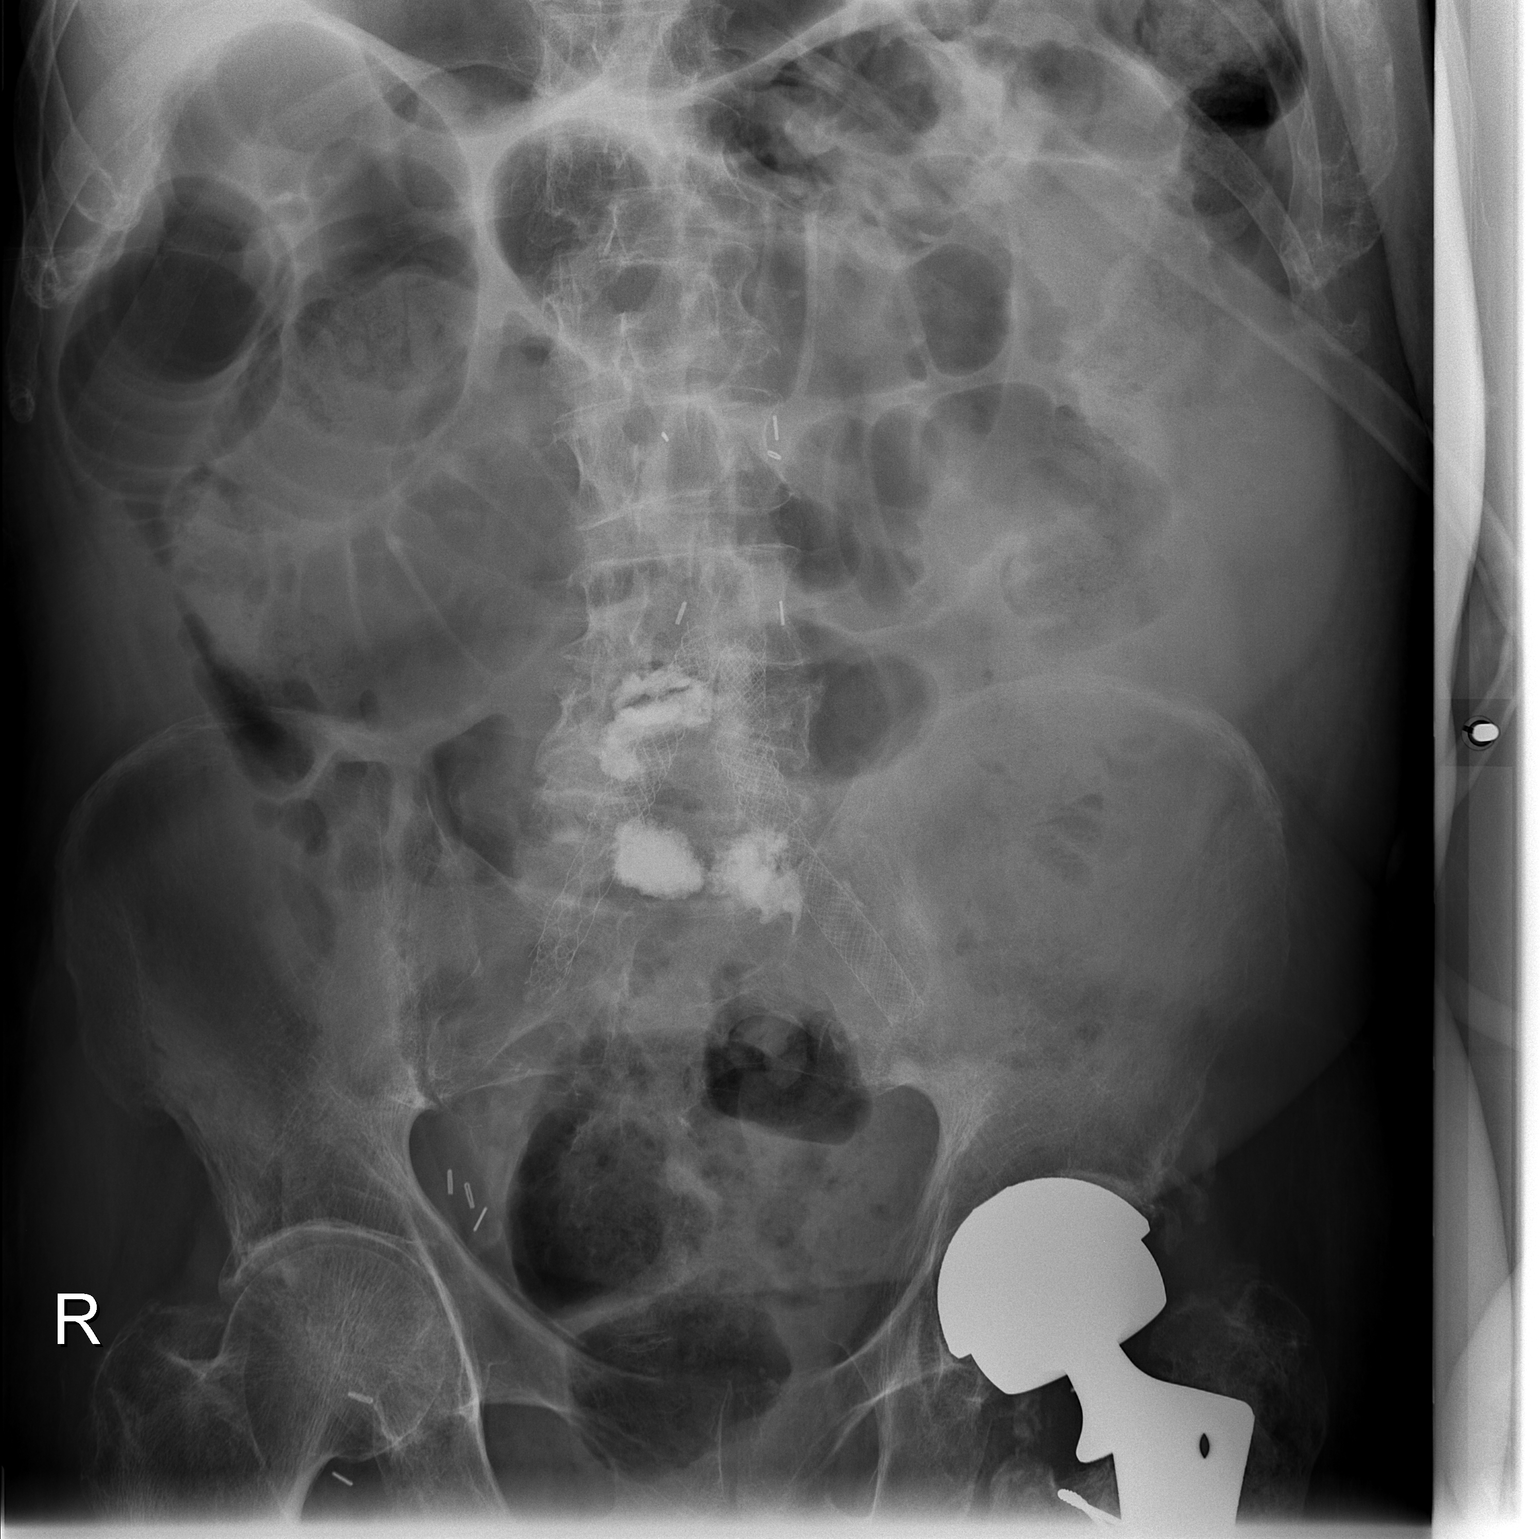

[1 of 1 positions shown; findings below may reference images not displayed]

FINDINGS: Persistent gas distended small bowel loops measuring up
to 5.7 cm.  Previously maximal dimension 7 cm.  The appearance is
consistent with small bowel obstruction.  The possibility of free
intraperitoneal air cannot be assessed on a supine view.  Status
post kyphoplasty lower lumbar spine.  Prior aortic bypass and left
hip surgery.
IMPRESSION: Persistent small bowel obstructive pattern.

## 2010-03-17 IMAGING — CR DG ABDOMEN 1V
1 series · 1 of 1 positions shown · non-contrast
Comparison: the previous day's study

CLINICAL DATA: Small bowel obstruction

ABDOMEN - 1 VIEW

[t abdomen supine]
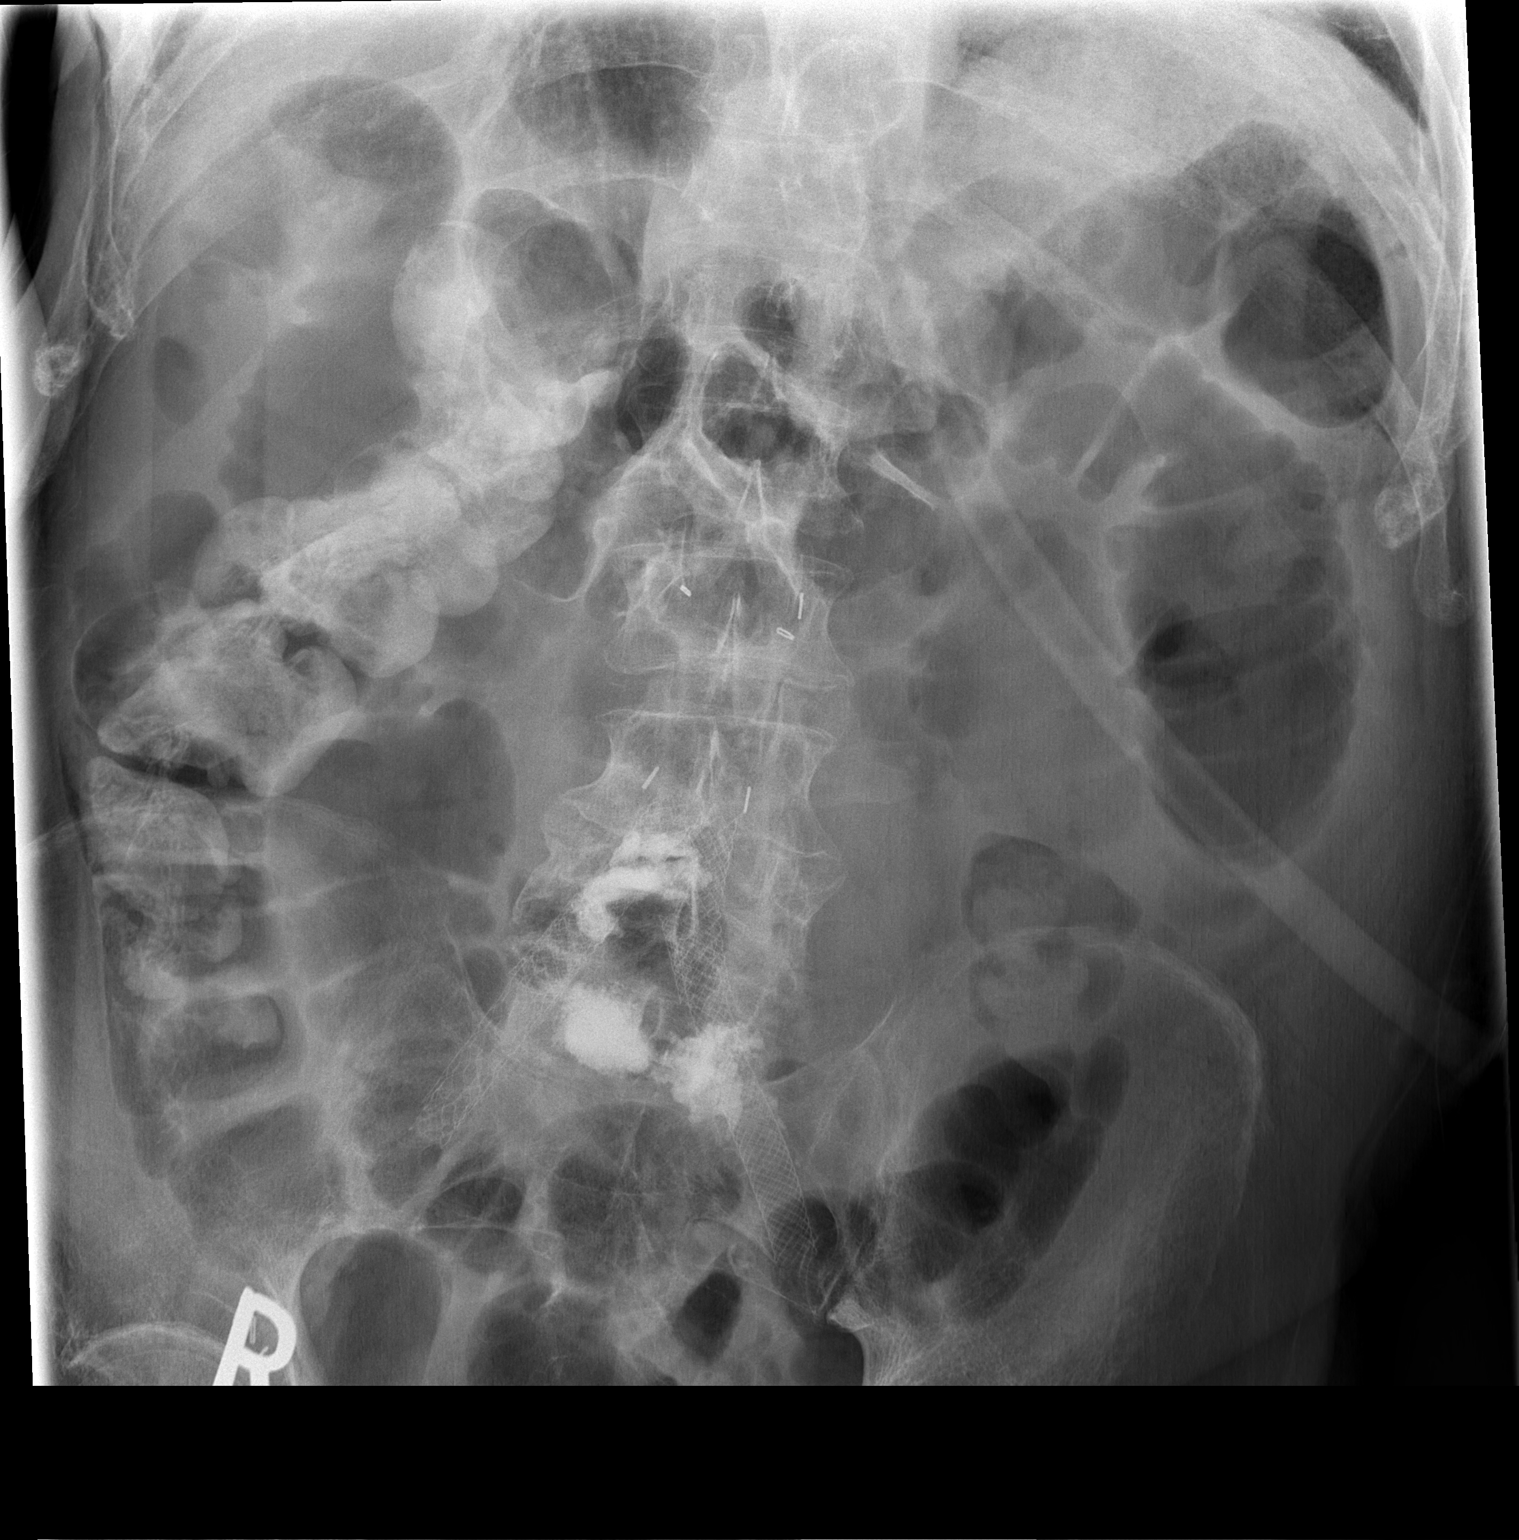

[1 of 1 positions shown; findings below may reference images not displayed]

FINDINGS: There are a few scattered   dilated small bowel loops in
the upper abdomen.  Colon is decompressed, with interval
progression of oral contrast material into the ascending portion.
Vascular clips project in the mid abdomen.  Bilateral iliac
arterial stents.  Changes of vertebral augmentation in the lower
lumbar spine at two levels.  Left hip arthroplasty partially seen.
IMPRESSION: Little interval change in the apparent partial small bowel
obstruction.

## 2010-03-18 IMAGING — CR DG ABDOMEN 1V
1 series · 1 of 1 positions shown · non-contrast
Comparison: 05/03/2008

CLINICAL DATA: Follow up small bowel obstruction

ABDOMEN - 1 VIEW

[t abdomen supine]
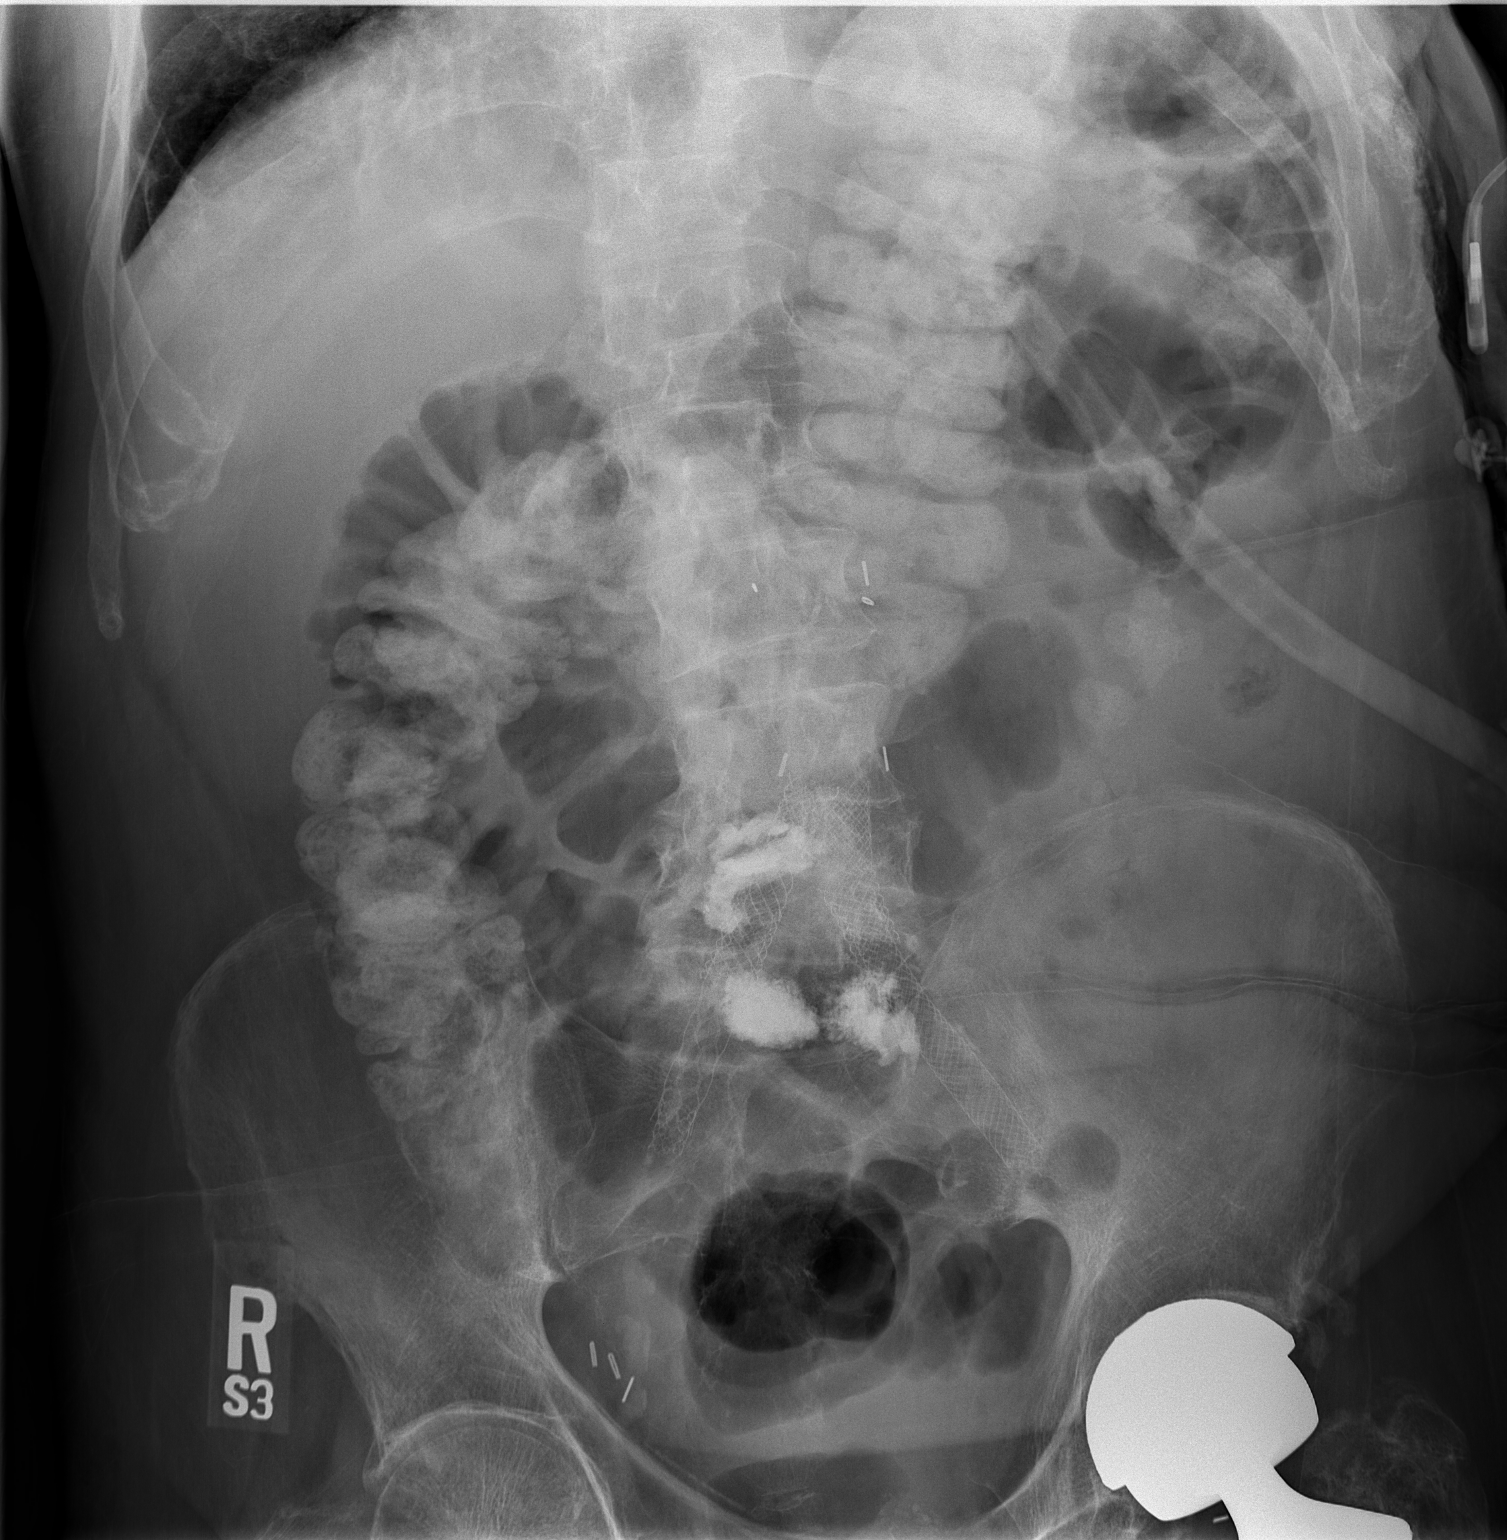

[1 of 1 positions shown; findings below may reference images not displayed]

FINDINGS: There is been interval improvement in abnormal small
bowel distention.

Enteric contrast material is identified within the colon.

Iliac stents are noted.
IMPRESSION: 1.  Improvement in small bowel distention.

## 2010-04-07 IMAGING — CR DG ABDOMEN ACUTE W/ 1V CHEST
1 series · 1 of 1 positions shown · non-contrast
Comparison: 05/04/2008

CLINICAL DATA: , abdominal pain

ACUTE ABDOMEN SERIES (ABDOMEN 2 VIEW & CHEST 1 VIEW)

[view not recorded]
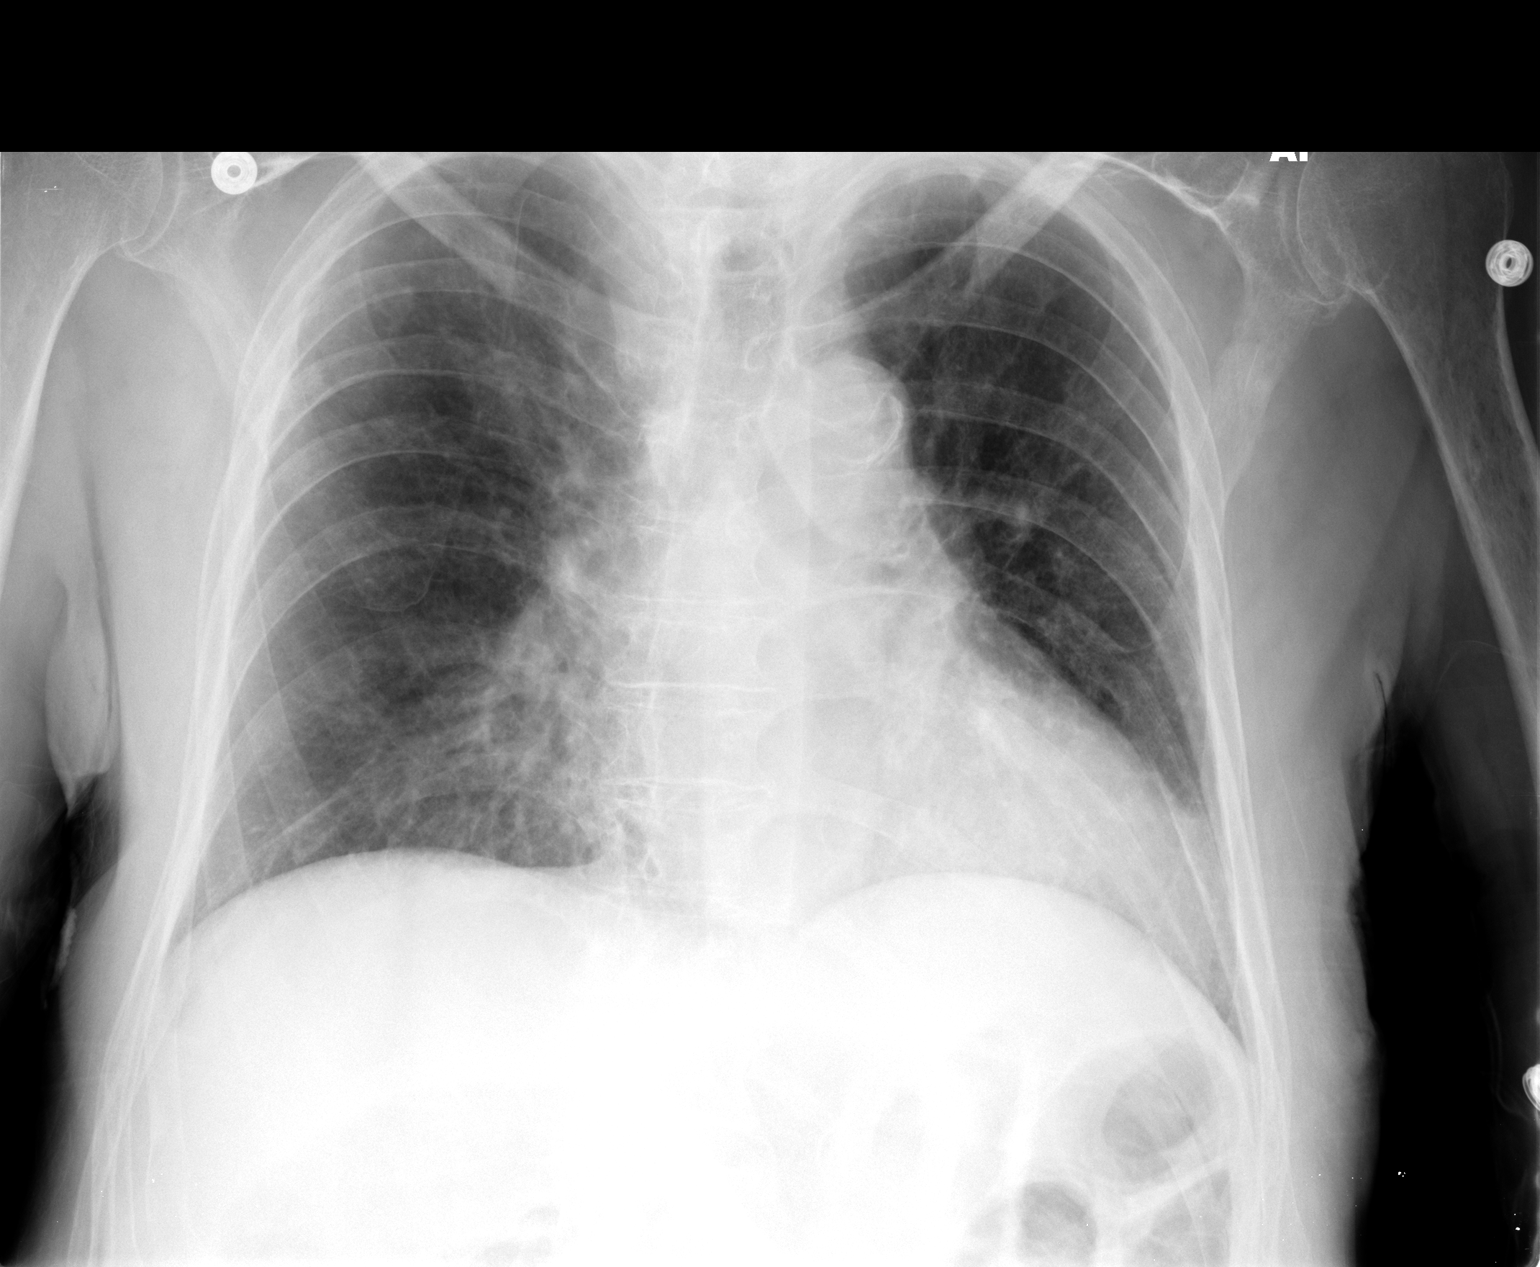

[1 of 1 positions shown; findings below may reference images not displayed]

FINDINGS: There are multiple abnormally dilated loops of small
bowel with air-fluid levels. There is a decrease in colonic gas.

Bilateral iliac stents are in place.

The patient is status post vertebroplasty of L4 and L5.
IMPRESSION: 1.  Abnormally increased caliber of the small bowel loops with air-
fluid levels worrisome for obstruction.

## 2010-07-12 LAB — CBC
HCT: 34.5 % — ABNORMAL LOW (ref 39.0–52.0)
HCT: 34.7 % — ABNORMAL LOW (ref 39.0–52.0)
Hemoglobin: 10.9 g/dL — ABNORMAL LOW (ref 13.0–17.0)
Hemoglobin: 11.8 g/dL — ABNORMAL LOW (ref 13.0–17.0)
MCHC: 34.2 g/dL (ref 30.0–36.0)
MCHC: 34.6 g/dL (ref 30.0–36.0)
MCV: 95.4 fL (ref 78.0–100.0)
Platelets: 145 10*3/uL — ABNORMAL LOW (ref 150–400)
RBC: 3.29 MIL/uL — ABNORMAL LOW (ref 4.22–5.81)
RBC: 3.64 MIL/uL — ABNORMAL LOW (ref 4.22–5.81)
RDW: 13.3 % (ref 11.5–15.5)
RDW: 13.8 % (ref 11.5–15.5)
WBC: 5.8 10*3/uL (ref 4.0–10.5)

## 2010-07-12 LAB — GLUCOSE, CAPILLARY
Glucose-Capillary: 109 mg/dL — ABNORMAL HIGH (ref 70–99)
Glucose-Capillary: 120 mg/dL — ABNORMAL HIGH (ref 70–99)
Glucose-Capillary: 130 mg/dL — ABNORMAL HIGH (ref 70–99)
Glucose-Capillary: 131 mg/dL — ABNORMAL HIGH (ref 70–99)
Glucose-Capillary: 160 mg/dL — ABNORMAL HIGH (ref 70–99)
Glucose-Capillary: 171 mg/dL — ABNORMAL HIGH (ref 70–99)
Glucose-Capillary: 180 mg/dL — ABNORMAL HIGH (ref 70–99)
Glucose-Capillary: 188 mg/dL — ABNORMAL HIGH (ref 70–99)
Glucose-Capillary: 191 mg/dL — ABNORMAL HIGH (ref 70–99)
Glucose-Capillary: 198 mg/dL — ABNORMAL HIGH (ref 70–99)

## 2010-07-12 LAB — CULTURE, BLOOD (ROUTINE X 2): Culture: NO GROWTH

## 2010-07-12 LAB — BASIC METABOLIC PANEL
Calcium: 7.9 mg/dL — ABNORMAL LOW (ref 8.4–10.5)
Chloride: 100 mEq/L (ref 96–112)
Creatinine, Ser: 0.62 mg/dL (ref 0.4–1.5)
Creatinine, Ser: 0.83 mg/dL (ref 0.4–1.5)
GFR calc Af Amer: >60 mL/min (ref >60)
GFR calc Af Amer: >60 mL/min (ref >60)
GFR calc non Af Amer: >60 mL/min (ref >60)
GFR calc non Af Amer: >60 mL/min (ref >60)
GFR calc non Af Amer: >60 mL/min (ref >60)
Glucose, Bld: 137 mg/dL — ABNORMAL HIGH (ref 70–99)
Potassium: 3.4 mEq/L — ABNORMAL LOW (ref 3.5–5.1)
Potassium: 5 mEq/L (ref 3.5–5.1)
Sodium: 136 mEq/L (ref 135–145)
Sodium: 139 mEq/L (ref 135–145)

## 2010-07-12 LAB — DIFFERENTIAL
Basophils Absolute: 0 10*3/uL (ref 0.0–0.1)
Basophils Absolute: 0 10*3/uL (ref 0.0–0.1)
Basophils Relative: 0 % (ref 0–1)
Basophils Relative: 0 % (ref 0–1)
Eosinophils Absolute: 0 10*3/uL (ref 0.0–0.7)
Eosinophils Relative: 0 % (ref 0–5)
Lymphocytes Relative: 12 % (ref 12–46)
Lymphocytes Relative: 8 % — ABNORMAL LOW (ref 12–46)
Monocytes Absolute: 0.8 10*3/uL (ref 0.1–1.0)
Monocytes Absolute: 1 10*3/uL (ref 0.1–1.0)
Neutro Abs: 9.3 10*3/uL — ABNORMAL HIGH (ref 1.7–7.7)
Neutrophils Relative %: 81 % — ABNORMAL HIGH (ref 43–77)

## 2010-07-12 LAB — URINE MICROSCOPIC-ADD ON

## 2010-07-12 LAB — POCT I-STAT, CHEM 8
Calcium, Ion: 1.05 mmol/L — ABNORMAL LOW (ref 1.12–1.32)
HCT: 35 % — ABNORMAL LOW (ref 39.0–52.0)
Hemoglobin: 11.9 g/dL — ABNORMAL LOW (ref 13.0–17.0)
Sodium: 135 mEq/L (ref 135–145)
TCO2: 27 mmol/L (ref 0–100)

## 2010-07-12 LAB — MAGNESIUM: Magnesium: 2.1 mg/dL (ref 1.5–2.5)

## 2010-07-12 LAB — URINALYSIS, ROUTINE W REFLEX MICROSCOPIC
Ketones, ur: NEGATIVE mg/dL
Nitrite: NEGATIVE
pH: 5.5 (ref 5.0–8.0)

## 2010-07-12 LAB — HEMOGLOBIN A1C
Hgb A1c MFr Bld: 6.5 % — ABNORMAL HIGH (ref 4.6–6.1)
Mean Plasma Glucose: 140 mg/dL

## 2010-07-30 LAB — POCT CARDIAC MARKERS
CKMB, poc: <1 ng/mL — ABNORMAL LOW (ref 1.0–8.0)
CKMB, poc: <1 ng/mL — ABNORMAL LOW (ref 1.0–8.0)
Myoglobin, poc: 136 ng/mL (ref 12–200)
Myoglobin, poc: 83.7 ng/mL (ref 12–200)
Troponin i, poc: <0.05 ng/mL (ref 0.00–0.09)

## 2010-07-30 LAB — LIPID PANEL
Cholesterol: 159 mg/dL (ref 0–200)
LDL Cholesterol: 100 mg/dL — ABNORMAL HIGH (ref 0–99)
Total CHOL/HDL Ratio: 3.6 RATIO
Triglycerides: 75 mg/dL (ref <150)
VLDL: 15 mg/dL (ref 0–40)

## 2010-07-30 LAB — COMPREHENSIVE METABOLIC PANEL
Albumin: 3.2 g/dL — ABNORMAL LOW (ref 3.5–5.2)
BUN: 12 mg/dL (ref 6–23)
Calcium: 8.9 mg/dL (ref 8.4–10.5)
Creatinine, Ser: 0.73 mg/dL (ref 0.4–1.5)
Glucose, Bld: 114 mg/dL — ABNORMAL HIGH (ref 70–99)
Total Protein: 6.4 g/dL (ref 6.0–8.3)

## 2010-07-30 LAB — BASIC METABOLIC PANEL
CO2: 29 mEq/L (ref 19–32)
Calcium: 8.4 mg/dL (ref 8.4–10.5)
Creatinine, Ser: 0.68 mg/dL (ref 0.4–1.5)
GFR calc Af Amer: >60 mL/min (ref >60)
GFR calc non Af Amer: >60 mL/min (ref >60)
Sodium: 143 mEq/L (ref 135–145)

## 2010-07-30 LAB — CULTURE, BLOOD (ROUTINE X 2)

## 2010-07-30 LAB — URINALYSIS, ROUTINE W REFLEX MICROSCOPIC
Bilirubin Urine: NEGATIVE
Hgb urine dipstick: NEGATIVE
Ketones, ur: NEGATIVE mg/dL
Protein, ur: NEGATIVE mg/dL
Urobilinogen, UA: 1 mg/dL (ref 0.0–1.0)

## 2010-07-30 LAB — POCT I-STAT, CHEM 8
BUN: 13 mg/dL (ref 6–23)
Creatinine, Ser: 0.7 mg/dL (ref 0.4–1.5)
Hemoglobin: 15.3 g/dL (ref 13.0–17.0)
Potassium: 3.8 mEq/L (ref 3.5–5.1)
Sodium: 140 mEq/L (ref 135–145)

## 2010-07-30 LAB — DIFFERENTIAL
Eosinophils Absolute: 0.1 10*3/uL (ref 0.0–0.7)
Lymphocytes Relative: 44 % (ref 12–46)
Lymphs Abs: 3.1 10*3/uL (ref 0.7–4.0)
Monocytes Relative: 7 % (ref 3–12)
Neutrophils Relative %: 47 % (ref 43–77)

## 2010-07-30 LAB — CBC
Hemoglobin: 11.8 g/dL — ABNORMAL LOW (ref 13.0–17.0)
MCHC: 34 g/dL (ref 30.0–36.0)
MCV: 94.9 fL (ref 78.0–100.0)
RBC: 3.67 MIL/uL — ABNORMAL LOW (ref 4.22–5.81)
RBC: 4.55 MIL/uL (ref 4.22–5.81)
RDW: 14.2 % (ref 11.5–15.5)
WBC: 7 10*3/uL (ref 4.0–10.5)

## 2010-07-30 LAB — HEMOGLOBIN A1C
Hgb A1c MFr Bld: 5.9 % (ref 4.6–6.1)
Mean Plasma Glucose: 123 mg/dL

## 2010-07-30 LAB — PROTIME-INR: Prothrombin Time: 12.9 seconds (ref 11.6–15.2)

## 2010-07-30 LAB — TSH: TSH: 6.807 u[IU]/mL — ABNORMAL HIGH (ref 0.350–4.500)

## 2010-07-30 LAB — URINE CULTURE

## 2010-07-30 LAB — LACTIC ACID, PLASMA: Lactic Acid, Venous: 4.2 mmol/L — ABNORMAL HIGH (ref 0.5–2.2)

## 2010-08-07 LAB — URINE CULTURE

## 2010-08-07 LAB — GLUCOSE, CAPILLARY
Glucose-Capillary: 100 mg/dL — ABNORMAL HIGH (ref 70–99)
Glucose-Capillary: 100 mg/dL — ABNORMAL HIGH (ref 70–99)
Glucose-Capillary: 106 mg/dL — ABNORMAL HIGH (ref 70–99)
Glucose-Capillary: 108 mg/dL — ABNORMAL HIGH (ref 70–99)
Glucose-Capillary: 111 mg/dL — ABNORMAL HIGH (ref 70–99)
Glucose-Capillary: 126 mg/dL — ABNORMAL HIGH (ref 70–99)
Glucose-Capillary: 140 mg/dL — ABNORMAL HIGH (ref 70–99)
Glucose-Capillary: 153 mg/dL — ABNORMAL HIGH (ref 70–99)
Glucose-Capillary: 163 mg/dL — ABNORMAL HIGH (ref 70–99)
Glucose-Capillary: 169 mg/dL — ABNORMAL HIGH (ref 70–99)
Glucose-Capillary: 171 mg/dL — ABNORMAL HIGH (ref 70–99)
Glucose-Capillary: 74 mg/dL (ref 70–99)
Glucose-Capillary: 88 mg/dL (ref 70–99)

## 2010-08-07 LAB — CBC
Hemoglobin: 11.9 g/dL — ABNORMAL LOW (ref 13.0–17.0)
Hemoglobin: 11.9 g/dL — ABNORMAL LOW (ref 13.0–17.0)
Hemoglobin: 15.3 g/dL (ref 13.0–17.0)
MCHC: 32.5 g/dL (ref 30.0–36.0)
MCHC: 33.2 g/dL (ref 30.0–36.0)
MCV: 95.8 fL (ref 78.0–100.0)
RBC: 3.78 MIL/uL — ABNORMAL LOW (ref 4.22–5.81)
RBC: 4.93 MIL/uL (ref 4.22–5.81)
RDW: 13.3 % (ref 11.5–15.5)
RDW: 13.9 % (ref 11.5–15.5)
RDW: 14 % (ref 11.5–15.5)
WBC: 5.2 10*3/uL (ref 4.0–10.5)
WBC: 8.8 10*3/uL (ref 4.0–10.5)

## 2010-08-07 LAB — BASIC METABOLIC PANEL
CO2: 26 mEq/L (ref 19–32)
Calcium: 8.6 mg/dL (ref 8.4–10.5)
Calcium: 8.7 mg/dL (ref 8.4–10.5)
Creatinine, Ser: 0.59 mg/dL (ref 0.4–1.5)
GFR calc Af Amer: >60 mL/min (ref >60)
GFR calc Af Amer: >60 mL/min (ref >60)
GFR calc non Af Amer: >60 mL/min (ref >60)
GFR calc non Af Amer: >60 mL/min (ref >60)
Glucose, Bld: 164 mg/dL — ABNORMAL HIGH (ref 70–99)
Glucose, Bld: 71 mg/dL (ref 70–99)
Sodium: 137 mEq/L (ref 135–145)
Sodium: 144 mEq/L (ref 135–145)

## 2010-08-07 LAB — RENAL FUNCTION PANEL
Albumin: 2.1 g/dL — ABNORMAL LOW (ref 3.5–5.2)
Calcium: 8 mg/dL — ABNORMAL LOW (ref 8.4–10.5)
GFR calc Af Amer: >60 mL/min (ref >60)
GFR calc non Af Amer: >60 mL/min (ref >60)
Phosphorus: 2.6 mg/dL (ref 2.3–4.6)
Potassium: 3.9 mEq/L (ref 3.5–5.1)
Sodium: 133 mEq/L — ABNORMAL LOW (ref 135–145)

## 2010-08-07 LAB — URINE MICROSCOPIC-ADD ON

## 2010-08-07 LAB — COMPREHENSIVE METABOLIC PANEL
ALT: 13 U/L (ref 0–53)
AST: 16 U/L (ref 0–37)
Alkaline Phosphatase: 93 U/L (ref 39–117)
CO2: 26 mEq/L (ref 19–32)
CO2: 27 mEq/L (ref 19–32)
Calcium: 8.5 mg/dL (ref 8.4–10.5)
Calcium: 8.9 mg/dL (ref 8.4–10.5)
Chloride: 107 mEq/L (ref 96–112)
Creatinine, Ser: 0.81 mg/dL (ref 0.4–1.5)
GFR calc Af Amer: >60 mL/min (ref >60)
GFR calc non Af Amer: >60 mL/min (ref >60)
GFR calc non Af Amer: >60 mL/min (ref >60)
Glucose, Bld: 136 mg/dL — ABNORMAL HIGH (ref 70–99)
Glucose, Bld: 90 mg/dL (ref 70–99)
Potassium: 3.8 mEq/L (ref 3.5–5.1)
Sodium: 143 mEq/L (ref 135–145)
Total Protein: 6.6 g/dL (ref 6.0–8.3)

## 2010-08-07 LAB — BRAIN NATRIURETIC PEPTIDE: Pro B Natriuretic peptide (BNP): 126 pg/mL — ABNORMAL HIGH (ref 0.0–100.0)

## 2010-08-07 LAB — CULTURE, BLOOD (ROUTINE X 2): Culture: NO GROWTH

## 2010-08-07 LAB — URINALYSIS, ROUTINE W REFLEX MICROSCOPIC
Glucose, UA: NEGATIVE mg/dL
Nitrite: NEGATIVE
Specific Gravity, Urine: 1.024 (ref 1.005–1.030)
pH: 8 (ref 5.0–8.0)

## 2010-08-07 LAB — DIFFERENTIAL
Eosinophils Absolute: 0 10*3/uL (ref 0.0–0.7)
Lymphocytes Relative: 11 % — ABNORMAL LOW (ref 12–46)
Lymphs Abs: 2.2 10*3/uL (ref 0.7–4.0)
Monocytes Relative: 5 % (ref 3–12)
Neutro Abs: 16.4 10*3/uL — ABNORMAL HIGH (ref 1.7–7.7)
Neutrophils Relative %: 83 % — ABNORMAL HIGH (ref 43–77)

## 2010-08-07 LAB — LIPASE, BLOOD: Lipase: 21 U/L (ref 11–59)

## 2010-08-08 LAB — DIFFERENTIAL
Eosinophils Relative: 0 % (ref 0–5)
Lymphocytes Relative: 17 % (ref 12–46)
Lymphs Abs: 2.6 10*3/uL (ref 0.7–4.0)
Monocytes Absolute: 0.8 10*3/uL (ref 0.1–1.0)

## 2010-08-08 LAB — CBC
MCHC: 33.6 g/dL (ref 30.0–36.0)
MCV: 92.4 fL (ref 78.0–100.0)
Platelets: 255 10*3/uL (ref 150–400)

## 2010-08-08 LAB — COMPREHENSIVE METABOLIC PANEL
AST: 23 U/L (ref 0–37)
Albumin: 3.3 g/dL — ABNORMAL LOW (ref 3.5–5.2)
Calcium: 9.1 mg/dL (ref 8.4–10.5)
Creatinine, Ser: 0.81 mg/dL (ref 0.4–1.5)
GFR calc Af Amer: >60 mL/min (ref >60)
Total Protein: 7.1 g/dL (ref 6.0–8.3)

## 2010-08-08 LAB — LIPASE, BLOOD: Lipase: 20 U/L (ref 11–59)
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
## 2010-09-05 NOTE — H&P (Signed)
NAME:  [name], [name] NO.:  [id number]   MEDICAL RECORD NO.:  [id number]          PATIENT TYPE:  EMS   LOCATION:  [facility]                         FACILITY:  [facility]   PHYSICIAN:  Lucita Ferrara, MD         DATE OF BIRTH:  1935-10-26   DATE OF ADMISSION:  02/20/2008  DATE OF DISCHARGE:                              HISTORY & PHYSICAL   HISTORY OF PRESENT ILLNESS:  The patient is a 75 year old who presented  to [facility] with intractable diffuse abdominal pain x  36 hours.  The patient was brought in by family members.  The patient  has got a history of cerebrovascular accident and dementia.  The patient  supposedly had been vomiting, nonbilious material and cannot keep  anything down.  Vomitus is described as dark.  The patient has a PEG  tube placed secondary to dysphagia which the patient actually is no  longer using.  However, the patient did use the PEG tube yesterday for  fluids.  The patient has a pretty complicated past medical history.  He  supposedly had a cerebrovascular accident nearly 1 year ago.  He has  been admitted numerous times for other diagnoses including, but not  limited to respiratory failure, recurrent aspiration pneumonia, MRSA, he  is status post tracheostomy.  He has atrial fibrillation.   REVIEW OF SYSTEMS:  A 12-point is otherwise negative.  The patient has  no melena, hematochezia or hematemesis.   PAST MEDICAL HISTORY:  As above.  1. Cerebrovascular accident.  2. MRSA.  3. Status post respiratory failure.  4. Recurrent aspiration pneumonia.  5. Atrial fibrillation.  6. Debility.  7. Diarrhea.  8. Dysphagia.  9. Trache dependence.   SOCIAL HISTORY:  Denies drugs, alcohol, tobacco.  Currently residing in  a skilled nursing facility.   ALLERGIES:  PENICILLIN.   MEDICATIONS AT SKILLED NURSING FACILITY:  1. Synthroid.  2. Plavix.  3. Lasix.  4. Cymbalta.  5. Hydrochlorothiazide.  6. Acetaminophen.  7. Lantus.   PHYSICAL EXAMINATION:  GENERAL:  The patient is in no acute distress.  The patient has obvious right-sided hemiparesis.  VITAL SIGNS:  Blood pressure is 112/61, pulse 89, respirations 22,  temperature 98.3, pulse ox 94% on room air.  HEENT:  Normocephalic, atraumatic.  Sclerae anicteric.  The patient's  left eye is completely shut.  NECK:  Supple.  No JVD.  ABDOMEN:  Soft, but there is some focal tenderness especially in the  right lower quadrant.   LABORATORY DATA:  Urinalysis is negative leukocytes, nitrites, moderate  amount of bilirubin, high specific gravity of 1.034, lipase 17.  Complete metabolic panel shows elevated glucose of 235, BUN 20,  creatinine 0.69.  His lactic acid is 1.6, his INR is 1.1.  His CBC shows  a white count of 13.8, hemoglobin 12.8, hematocrit 38.4, platelet count  of 213.  Cardiac markers essentially negative.   DIAGNOSTICS:  1. CT scan of the abdomen and pelvis consistent with small bowel      obstruction with dilated small bowel throughout the abdomen,      surgical changes  of aortoiliac bypass surgery, intra-and extra-      biliary dilatation chronic, renal calculus.  Edema or fluid around      the gallbladder noted.  2. Digital x-ray shows a small bowel obstruction.   ASSESSMENT/PLAN:  The patient is a 75 year old with numerous complicated  medical history including status post cerebrovascular accident, status  post tracheostomy, who presented with abdominal pain and was found to  have a small bowel obstruction.  The patient was evaluated by the  surgical team here in the emergency room who has determined that the  patient is first of all not a surgical candidate.  Second of all, the  patient would be best managed with nonsurgical management including  decompression of the bowel with suction low intermittent.  In this  regard, we will go ahead and admit the patient to the stepdown unit.  We  will continue decompression and monitor the patient's  overall  hemodynamic status.  We will continue his Plavix for now given the  patient had a history of cerebrovascular accident.  We will continue IV  hydration as the patient is somewhat hemoconcentrated and has a high  urine specific gravity.  We will get a TSH and continue his Synthroid.  We will certainly put on sliding-scale insulin.  We will decrease his  Lasix dose for now.  The rest of the plans are dependent on his  progress, followup and recommendations.      Lucita Ferrara, MD  Electronically Signed     RR/MEDQ  D:  02/20/2008  T:  02/21/2008  Job:  [id number]
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
## 2010-09-05 NOTE — Discharge Summary (Signed)
NAME:  [name], NYCE NO.:  [id number]   MEDICAL RECORD NO.:  [id number]          PATIENT TYPE:  INP   LOCATION:  4737                         FACILITY:  [facility]   PHYSICIAN:  Elliot Cousin, M.D.    DATE OF BIRTH:  November 30, 1935   DATE OF ADMISSION:  02/20/2008  DATE OF DISCHARGE:  03/12/2008                               DISCHARGE SUMMARY   ADDENDUM   Please see the previous discharge summary dictated by Dr. Sherrie Mustache on  February 29, 2008.  This is an addendum.   HOSPITAL COURSE AND DISCHARGE DIAGNOSES:  1. SMALL-BOWEL OBSTRUCTION:  The patient's PEG tube was clamped after      it drained approximately 900 mL the day before on or approximating      March 01, 2008.  The next day, the patient experienced more      nausea and vomiting.  The General Surgery team decided to unclamp      the PEG tube to allow the tube to drain more.  A followup abdominal      x-ray on March 03, 2008, revealed no significant change in bowel      gas pattern.  The abdominal x-ray, in essence, revealed air-filled      loops of small-bowel without significant distention and no findings      for perforation.  After approximately 48 hours of allowing the PEG      tube to drain, it was clamped once again.  The patient was started      on a clear liquid diet followed by a full liquid diet.  He did      continue to have nausea, but no vomiting.  An abdominal and pelvic      CT was ordered for further evaluation on March 07, 2008.  The CT      results were significant for an interval improved appearance of      bowel loops suggesting decreased small-bowel obstruction.  The CT      scan of the pelvis revealed rectosigmoid colonic wall thickening      despite distention with fluid.  Per the radiologist's      interpretation, proctitis could not be excluded.  Given these      findings, gastroenterologist, Dr. Russella Dar was consulted.  He      evaluated the patient on March 08, 2008.  He  recommended      conservative treatment as the patient declined invasive evaluation      with a colonoscopy or a sigmoidoscopy which was suggested.      Although the patient had no further vomiting, he developed loose      stools.  At times, his stools were bordering on watery.  Stool      studies were ordered, however, no specimens were collected.  Dr.      Russella Dar recommended decreasing the laxative burden.  Over time, the      patient's diarrhea subsided and his nausea and vomiting completely      resolved.  The patient's diet was advanced to a heart healthy diet.      He did not  have a fondness for hospital food.  Because of a concern      about malnutrition, Osmolite trickle tube feedings were initiated.  2. RECTOSIGMOID THICKENING:  As indicated above, the CT scan of the      abdomen revealed thickening of the rectosigmoid colon.  The finding      was associated with diarrhea.  Stool studies were ordered, however,      apparently no specimens were collected.  The laxative burden was      decreased.  The diarrhea subsided prior to hospital discharge.      There was no evidence of black tarry stools or bright red blood per      rectum.  The patient does have a history of a colonic adenoma and      scattered diverticula per colonoscopy in 2007.  Further evaluation      will be deferred to his primary gastroenterologist, Dr. Marina Goodell.  3. MALNUTRITION AND ANOREXIA.  The patient had been started on TNA      approximately a week and half ago.  The TNA was weaned off.  He was      subsequently ordered a clear liquid and then a full liquid diet.      The patient ate very little.  When the small-bowel obstruction      resolved clinically, his diet was advanced to a heart-healthy diet.      He did not eat well during the hospitalization in part, because he      wanted to go home and he did not like the hospital food.  Because      of concern of malnutrition, Osmolite via the PEG tube was  restarted      at 30 mL an hour to supplement his diet.  I explained to the      patient and his family that the Osmolite should be given only twice      a day and he should be encouraged to eat small frequent meals as      tolerated.  4. MILD PICC-ASSOCIATED CELLULITIS OF THE LEFT UPPER EXTREMITY.  The      patient had a PICC line inserted on February 26, 2008.  Several days      ago, the site was noted to be erythematous and mildly tender.  The      patient was given 1 g of vancomycin empirically.  The PICC line was      removed shortly thereafter.  He experienced no significant fevers      or leukocytosis.  Following the removal of the PICC line, the      extent of the erythema subsided.  5. CHRONIC ATRIAL FIBRILLATION WITH ASYMPTOMATIC BRADYCARDIA.  The      patient was restarted on bisoprolol 5 mg daily.  However, his heart      rate began to fall into the low 50s.  The patient had no complaints      of dizziness, shortness of breath, or palpitations.  The bisoprolol      dose was decreased to 2.5 mg daily.   CONSULTATIONS:  1. Gastroenterologist, Dr. Russella Dar and colleagues.  2. [facility] surgeons.  3. Registered dietician.   PROCEDURE PERFORMED:  1. Abdominal x-ray on March 10, 2008.  The results revealed      improving partial small-bowel obstruction.  2. CT scan of the abdomen and pelvis on March 07, 2008.  The      results revealed interval improved appearance  of small-bowel loops      suggesting decreased small-bowel obstruction.  Oral contrast has      not yet reached to the distal small-bowel or colon and numerous      loops remain at the upper limits of normal to mildly dilated.      Bilateral lower lobe atelectasis.  Stable lower lumbar vertebral      body fractures status post vertebroplasty.  L3 superior endplate      deformity.  CT scan of the pelvis revealed rectosigmoid colonic      wall thickening despite distention with fluid.  Proctitis is not       excluded.  Otherwise, no acute findings in the pelvis.  3. Status post removal of PICC on March 09, 2008.   DISCHARGE MEDICATIONS:  1. Osmolite 1.2 calories tube feeding 1 can via the PEG tube every 8-      12 hours or twice to three times daily.  After each can, add 6      ounces of water through the PEG tube.  2. Synthroid 100 mcg daily.  3. Furosemide 20 mg daily.  4. Plavix 75 mg daily.  5. Cymbalta 60 mg daily.  6. Lantus insulin 15 units subcu nightly.  7. Bisoprolol 5 mg half a tablet every other day.  8. Multivitamin once daily.  9. Colace 100 mg once or twice daily.   DISCHARGE/DISPOSITION:  The patient is being discharged home in improved  and stable condition.  He was advised to follow up with his primary care  physician Dr. Nila Nephew in 1-2 weeks.  Home Health Services have been  resumed.      Elliot Cousin, M.D.  Electronically Signed    DF/MEDQ  D:  03/12/2008  T:  03/13/2008  Job:  [id number]   cc:   Erskine Speed, M.D.  Adolph Pollack, M.D.
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
## 2010-09-05 NOTE — Discharge Summary (Signed)
NAME:  [name], [name] NO.:  [id number]   MEDICAL RECORD NO.:  [id number]          PATIENT TYPE:  INP   LOCATION:  5028                         FACILITY:  [facility]   PHYSICIAN:  Elliot Cousin, M.D.    DATE OF BIRTH:  1936/02/11   DATE OF ADMISSION:  05/01/2008  DATE OF DISCHARGE:  05/07/2008                               DISCHARGE SUMMARY   DISCHARGE DIAGNOSES:  1. Recurrent partial small bowel obstruction.  Resolved without      operative intervention. (The patient does not desire operative      repair or surgical intervention for recurrent small bowel      obstructions).  2. Bradycardia secondary to a combination of Catapres and bisoprolol.      TSH was within normal limits at 3.096.  3. Proteus urinary tract infection; resistant to Cipro.  4. Status post percutaneous endoscopic gastrostomy removal on May 05, 2008, by Dr. Juanda Chance.  5. Type 2 diabetes mellitus.  6. Chronic atrial fibrillation.  7. Hypertension.   DISCHARGE MEDICATIONS:  1. Reglan 5 mg 30 minutes before breakfast and 30 minutes before      dinner.  2. Ceftin 500 mg b.i.d. for 5 more days.  3. Tessalon Perles 100 mg 3 times daily as needed for cough.  4. MiraLax laxative 17 g in 8 ounces of favorite beverage daily.  5. Lantus 15 units subcu nightly.  6. Cymbalta 60 mg daily.  7. Bisoprolol 5 mg half a tablet daily (start in the morning and if      your heart rate is greater than 55 beats per minute).  8. Furosemide 20 mg daily.  9. Synthroid 100 mcg daily.  10.Plavix 75 mg daily.  11.Darvocet-N 100 one tablet every 6 hours as needed for pain.  12.Amitriptyline 25 mg nightly.   DISCHARGE DISPOSITION:  The patient is being discharged to home in  improved and stable condition.  He was advised to follow up with his  primary care physician, Dr. Nila Nephew in 1-2 weeks.   CONSULTATIONS:  Gastroenterologists,Daniel Marye Round, MD, and Hedwig Morton.  Juanda Chance, MD.   PROCEDURE PERFORMED:  1.  Peripherally inserted central catheter placement on May 03, 2008.  Removed on May 07, 2008.  2. Abdominal x-ray on May 04, 2008.  The results revealed      improvement in small bowel obstruction.  3. Abdominal x-ray on May 03, 2008.  The results revealed little      interval change in the apparent partial small bowel obstruction.   HISTORY OF PRESENT ILLNESS:  The patient is a 75 year old man with a  past medical history significant for recurrent partial small bowel  obstructions, status post stroke, type 2 diabetes mellitus, and chronic  atrial fibrillation.  He presented to the emergency department on  May 01, 2008, with a chief complaint of intractable nausea, vomiting,  and abdominal pain.  When he was evaluated in the emergency department,  a CT scan of the abdomen and pelvis was ordered and it revealed dilated  loops of small bowel consistent with a  moderate-to-high-grade small  bowel obstruction and a large amount of stool in the rectum.  The  patient was, therefore, admitted for further evaluation and management.   For additional details, please see the dictated history and physical.   HOSPITAL COURSE:  1. RECURRENT SMALL BOWEL OBSTRUCTION.  The patient was made n.p.o.      The PEG tube which had been inserted a few years previously, was      applied to the suction for decompression.  The tentative plan was      to place an NG tube if the PEG tube did not drain.  The PEG tube      did drain effectively.  He was started on maintenance IV fluids.      Symptomatic treatment was provided with as needed Zofran and as      needed morphine.  Subsequently Reglan was added at 10 mg IV q.6 h.      scheduled.  Prophylactic Protonix was added at 40 mg IV daily as      well.  Because of the fecal impaction, the patient was given a      number of enemas and suppositories with relatively good results.      Subsequently, gastroenterologist, Dr. Christella Hartigan was consulted.   He      agreed with the medical management.  He discussed possible surgical      repair with the patient and family, and the patient was adamantly      opposed to any type of surgical intervention.  Serial abdominal x-      rays were ordered.  The followup abdominal x-ray did not reveal      significant improvement; however, the abdominal x-ray on May 04, 2008, revealed significant decrease in dilated small bowel      loops.  The followup gastroenterology consultation was provided by      Dr. Juanda Chance.  She recommended that the PEG tube be removed.  She      subsequently removed it on May 05, 2008, with the consent of      the patient and the patient's wife.  He was started on a clear      liquid diet, which was subsequently advanced to a carbohydrate-      modified diet.  The patient tolerated the advancement well.  He is      currently eating without any nausea, vomiting, or abdominal pain.      He will be discharged to home on b.i.d. dosing of Reglan and      MiraLax,  hopefully to decrease the risk of recurrent obstruction.  2. CHRONIC ATRIAL FIBRILLATION, HYPERTENSION, AND BRADYCARDIA.  The      patient has a history of chronic atrial fibrillation treated with      Plavix for anticoagulation and bisoprolol for rate control.      Because he was n.p.o., the bisoprolol and Plavix were withheld      initially.  He was started on Catapres for blood pressure control.      Once he was able to start an oral diet, an order was written to      discontinue the Catapres patch and to restart the bisoprolol.      However, the Catapres patch was not discontinued and therefore, the      patient's heart rate decreased to the upper 40s.  The Catapres      patch was removed.  Today, his heart rate is in  the lower 50s.  He      and his wife were advised to restart the bisoprolol tomorrow on      May 08, 2008, if his heart rate is greater than 55 beats per      minute.  His blood pressure  was well controlled during the      hospitalization.  Plavix has been restarted.  3. PROTEUS URINARY TRACT INFECTION.  A Foley catheter was inserted at      the time of the initial hospital assessment.  A urinalysis was      ordered and it revealed white blood cells.  He was started      empirically on ciprofloxacin.  However, the Cipro was discontinued      when the sensitivities revealed that the Proteus was resistant to      Cipro.  Therefore, the patient was started on Rocephin.  He has      received a total of 5 days of therapy with antibiotics.  He will be      discharged to home on 5 more days of Ceftin 500 mg b.i.d.  He is      currently afebrile, and his white count is within normal limits.      The Foley catheter has been removed, and he has no complaints of      painful urination.  4. TYPE 2 DIABETES MELLITUS.  The patient's capillary blood glucose      was well controlled.  5. HYPOTHYROIDISM.  The patient was maintained on replacement therapy.      His TSH was within normal limits at 3.096.      Elliot Cousin, M.D.  Electronically Signed     DF/MEDQ  D:  05/07/2008  T:  05/07/2008  Job:  [id number]   cc:   Erskine Speed, M.D.

## 2010-09-05 NOTE — Discharge Summary (Signed)
NAME:  [name], [name] NO.:  [id number]   MEDICAL RECORD NO.:  [id number]          PATIENT TYPE:  INP   LOCATION:  5529                         FACILITY:  [facility]   PHYSICIAN:  Altha Harm, MDDATE OF BIRTH:  [birth year]   DATE OF ADMISSION:  10/28/2008  DATE OF DISCHARGE:  10/29/2008                               DISCHARGE SUMMARY   DISCHARGE DISPOSITION:  Home.   FINAL DISCHARGE DIAGNOSES:  1. Acute choking episode.  2. Lactic acidosis resolved.  3. Dysphagia.   SECONDARY DIAGNOSES:  1. Prehospital presumed aspiration pneumonia.  2. Diabetes type 2.  3. Hypothyroidism.  4. Chronic atrial fibrillation not on anticoagulation.  5. Recurrent small bowel obstruction.  6. History of cerebrovascular accident with resultant left      hemiparesis.   DISCHARGE MEDICATIONS:  1. Lantus 15 units subcu daily.  2. Cymbalta 60 mg p.o. daily.  3. Lasix 20 mg p.o. daily.  4. Bisoprolol 5 mg p.o. daily.  5. Synthroid 100 mcg p.o. daily.  6. Plavix 75 mg p.o. daily.  7. Amitriptyline 25 mg p.o. daily.  8. Darvocet-N 100 one to two tablets p.o. q.4 h p.r.n.  9. Flagyl 500 mg p.o. t.i.d. x7 days.  10.Ciprofloxacin 250 mg p.o. b.i.d. x7 days.   CONSULTANTS:  Nelda Bucks, MD, pulmonary critical care.   PROCEDURES:  None.   DIAGNOSTIC STUDIES:  Portable chest x-ray one-view which shows no  significant changes in right middle lobe air space disease.  Please note  that the patient had an outpatient chest x-ray done on July 7 which  showed focal air space disease in the right middle lobe suggesting  pneumonia.  Soft tissue neck x-ray which showed soft tissues of the neck  within normal limits.  No unexpected radiopaque foreign body identified.  CT of the head without contrast done on admission which showed no  intracranial hemorrhage.  Remote infarcts without CT evidence of large  acute infarct.  Metallic structures near the orbital apex bilaterally.  Portable  chest x-ray done on July 9 which showed no acute findings.  There has been interval improvement in the aeration of the left lower  lobe.  Modified barium swallow which showed mild and/or minimal  pharyngeal dysphagia.  Diet recommendations for a dysphagia 3 diet with  chopped meats and thin liquids with aspiration precautions.   ALLERGIES:  PENICILLINS and HYDROMORPHONE.   CODE STATUS:  Full code.   PRIMARY CARE PHYSICIAN:  Erskine Speed, M.D.   CHIEF COMPLAINT:  Choking episode/altered mental status.   HISTORY OF PRESENT ILLNESS:  Please refer to the H and P by Dr.  Ardyth Harps for details of the HPI.  However, in short, this is a 75-year-  old gentleman who 2 days prior to admission was diagnosed with pneumonia  and started on treatment.  The patient had a history of dysphagia  requiring a PEG which was removed and the patient was now taking food by  mouth for the last year.  The patient had been having lunch with his  wife at approximately 11:00 when he had a choking episode requiring  Heimlich  maneuver.  The patient had some alteration in his mental status  and was brought to the emergency room.   HOSPITAL COURSE:  The patient upon arrival to the emergency room  continued to have some alteration in mental status.  The patient was  admitted on observation basis.  Study swallow was carried out and the  patient was observed for any further deterioration of his clinical  state.  Over the past 24 hours the patient has actually had improvement  in his clinical condition.  He is now awake, alert and oriented x3.  He  has tolerated an appropriate texture diet without any difficulty.  The  patient was started on antibiotics prior to coming to the hospital.  However, his family is not able to articulate what antibiotic it is.  However, here in the hospital the patient was treated with Flagyl and  ciprofloxacin and will be continued on that regimen for 7 days for full  treatment course of  the pneumonia to cover anaerobes.  The patient has  been without fever.  He has been without an elevation of his white blood  cell count and at this time is without any hypoxia.  The patient will be  continued on his usual medications as he has been taking as an  outpatient.   In terms of his diabetes his hemoglobin A1c is 5.6 which demonstrates  good control of the patient's diabetes on his current regimen and that  will be continued.  His TSH was within normal limits and the patient is  being continued as his usual dose of Synthroid.  The patient should  follow up with his primary care physician post course of antibiotics for  evaluation of resolution of his respiratory symptoms.  The patient and  his wife have been instructed on the appropriate diet texture and  aspiration precautions care notes have been given to the patient and his  wife.  At this time the patient is afebrile.  He is clinically stable  and ready for discharge.   FOLLOWUP:  The patient should follow up with Dr. Nila Nephew as noted  post completion of antibiotics or as needed.   Total time spent on this patient discharge process 40 minutes.      Altha Harm, MD  Electronically Signed     MAM/MEDQ  D:  10/29/2008  T:  10/29/2008  Job:  [id number]

## 2010-09-05 NOTE — Consult Note (Signed)
NAME:  [name], [name] NO.:  [id number]   MEDICAL RECORD NO.:  [id number]          PATIENT TYPE:  INP   LOCATION:  2308                         FACILITY:  [facility]   PHYSICIAN:  Hedwig Morton. Juanda Chance, MD     DATE OF BIRTH:  10-14-1935   DATE OF CONSULTATION:  05/27/2007  DATE OF DISCHARGE:                                 CONSULTATION   PROCEDURE:  Percutaneous endoscopic gastrostomy.   INDICATIONS:  This 75 year old gentleman suffered a hemorrhagic CVA  after a hip surgery on May 12, 2007.  He has had neurogenic  dysphagia and his mental status has been altered.  His nutritional  support has consisted currently of  Panda tube feedings of Promote at 70  mL an hour which have been tolerated quite well.  The family has  requested long term nutritional support and PEG has been discussed with  the family and consent was obtained from the wife, as well as from the  son.   ENDOSCOPE:  Olympus videoscope.   SEDATION:  Versed 3 mg IV, fentanyl 75 mcg IV.   FINDINGS:  Olympus 10 cm video scope passed in the direction of  the  posterior pharynx and into the esophagus.  The patient was given bypass  oxygen saturations normal were while he is on the respirator.  His  endotracheal cuff did not need to be deflated.  The Panda tube was  removed prior to the endoscopy.  The gastrostomy site was located in the  epigastrium to the left of the midline by transillumination of the  abdominal wall with endoscope.  The skin was infiltrated with 1%  Xylocaine.  Small incision was made and Seldinger needle passed into the  gastric lumen under direct vision.  Through the Seldinger needle, guide  wire was then passed through the gastric lumen and was grasped with a  snare which was placed through the endoscope.  With the help of the  snare, the guide wire was then pulled back through the esophagus and  outside of the mouth.  The gastrostomy tube was then placed over the  guide wire, the  tapered end first, and advanced through the esophagus,  through the stomach, into the gastrostomy opening.  The mushroom T-  bumper of the gastrostomy tube was snug against the abdominal wall.  Retention disks and the adaptors were placed.  The patient tolerated the  procedure well.  Brief endoscopic exam of the esophagus, stomach, and  duodenum was normal.   IMPRESSION:  1. Placement of 24-[employer] push gastrostomy.  2. Normal endoscopic exam.   PLAN:  Routine orders have been written for antireflux measures, skin  care, as well as tube feeding, which will be started tomorrow morning.      Hedwig Morton. Juanda Chance, MD  Electronically Signed     DMB/MEDQ  D:  05/27/2007  T:  05/28/2007  Job:  [id number]   cc:   Feliberto Gottron. Turner Daniels, M.D.
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
## 2010-09-05 NOTE — Op Note (Signed)
NAME:  [name], [name] NO.:  [id number]   MEDICAL RECORD NO.:  [id number]          PATIENT TYPE:  INP   LOCATION:  5011                         FACILITY:  [facility]   PHYSICIAN:  Lucky Cowboy, MD         DATE OF BIRTH:  Jun 09, 1935   DATE OF PROCEDURE:  05/28/2007  DATE OF DISCHARGE:  06/20/2007                               OPERATIVE REPORT   PREOPERATIVE DIAGNOSIS:  Chronic ventilatory dependency, postoperative  heart failure.   POSTOPERATIVE DIAGNOSIS:  Chronic ventilatory dependency, postoperative  heart failure.   PROCEDURE:  Tracheotomy.   SURGEON:  Lucky Cowboy, MD   ANESTHESIA:  General.   ESTIMATED BLOOD LOSS:  Less than 20 mL.   SPECIMENS:  None.   COMPLICATIONS:  None.   INDICATIONS:  This patient is a 75 year old male, who has been  ventilatory dependent.  It is anticipated that he will need prolonged  ventilatory support due to chronic obstructive pulmonary disease and  aspiration pneumonia.  For these reasons, tracheotomy is performed.   FINDINGS:  The patient is noted to have normal tracheal anatomy.  Tracheotomy tube was #8 and placed through between rings 2 and 3.   PROCEDURE:  The patient was taken to the operating room and placed on  the table in a supine position.  He was then placed under general  anesthesia through the existing lines in his tracheal tube.  The neck  was gently extended.  It was prepped with Betadine and draped in the  usual sterile fashion.  A transverse 2-cm incision was made using a #15  blade over approximately ring 2.  Subcutaneous fat was divided using  Bovie cautery.  The strap muscles were divided using Bovie cautery in  the median raphe.  The thyroid was elevated off the trachea with the  Bovie and right-angled clamps.  It was clamped and divided using Bovie  cautery, then tied off using 2-0 silk suture.  A #15 blade was used to  make an incision between cartilage rings 2 and 3 using a #15 blade.  The  incision  was extended just about 2 mm with thyroid scissors.  A 0 silk  suture was placed around ring 2 and ring 3 and a #8 Shiley cuffed  tracheotomy tube placed in the trachea after withdrawal of the existing  cuffed orally intubated tube was retracted.  Breath sounds were equal and CO2 was returned.  The flange of the  tracheotomy tube was secured in 4 quadrant locations using 0 silk  sutures.  Velcro trach tie was applied.  The patient was awakened from  the anesthesia and taken to the Post Anesthesia Care Unit in stable  condition.  There were no complications.      Lucky Cowboy, MD  Electronically Signed     SJ/MEDQ  D:  08/14/2007  T:  08/15/2007  Job:  [id number]

## 2010-09-05 NOTE — H&P (Signed)
NAME:  [name], SCHIFF NO.:  [id number]   MEDICAL RECORD NO.:  [id number]          PATIENT TYPE:  INP   LOCATION:  1833                         FACILITY:  [facility]   PHYSICIAN:  Peggye Pitt, M.D. DATE OF BIRTH:  [birth year]   DATE OF ADMISSION:  10/28/2008  DATE OF DISCHARGE:                              HISTORY & PHYSICAL   PRIMARY CARE PHYSICIAN:  Dr. Erskine Speed.   CHIEF COMPLAINT:  Choking episode/altered mental status.   HISTORY OF PRESENT ILLNESS:  [name] is a very pleasant 75-year-  old Caucasian man with a multitude of medical comorbidities, who was  having [place] food with his wife at 11:30 this morning when he suddenly  had a choking episode and passed out.  The wife called 911.  The  operator instructed her to perform the Heimlich maneuver, per wife's  report she managed to dislodge a large chunk of chicken from his throat  after which he slowly started to wake up.  Upon EMS arrival, he was  unresponsive, but slowly began to wake up.  Upon EMS transfer, he  proceeded to vomit several times.  Pulmonary critical care has evaluated  the patient in the emergency department and does not believe that he is  an appropriate ICU admission and, hence, they have transferred the  admission to our care.  Mr [name] has complete recollection of the  events until the moment where he passed out.  He then remembers waking  up in the ambulance and having several episodes of vomiting.  Of note on  Monday, October 24, 2008, he had seen Dr. Chilton Si, who thought he might have  had a pneumonia.  A chest x-ray was done 2 days later which did in fact  show a pneumonia and Dr. Chilton Si had started him on an unknown antibiotic  for this, which he only took one dose prior to this episode occurring.   ALLERGIES:  HE STATED ALLERGY TO PENICILLIN.   PAST MEDICAL HISTORY:  1. Chronic atrial fibrillation.  2. Hypertension.  3. Hypothyroidism.  4. Type 2 diabetes mellitus.  5. Recurrent small bowel obstruction.  6. History of CVA with resultant left hemiparesis, as well as      dysphagia that required a PEG tube that has now been discontinued.   SOCIAL HISTORY:  He lives with his wife and is wheelchair bound.  No  alcohol, tobacco or illicit drug use.   FAMILY HISTORY:  Noncontributory in this elderly gentleman.   REVIEW OF SYSTEMS:  Negative except as already mentioned in HPI.   PHYSICAL EXAMINATION:  VITAL SIGNS:  Upon admission, blood pressure  117/71, heart rate 78, respirations 20.  O2 saturations 99% on room air  with a temperature of 97.6.  GENERAL:  He is now alert, awake and oriented x3, not in acute distress  with good O2 saturations on room air.  HEENT:  Normocephalic, atraumatic.  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  NECK:  Supple.  No JVD, no lymphadenopathy, no bruits.  HEART:  He has irregularly irregular rhythm.  He has a strong  systolic  ejection murmur that is best heard over the right upper sternal border.  LUNGS:  He has diffuse rhonchi bilaterally.  ABDOMEN:  Soft, nontender, nondistended.  Positive bowel sounds.  EXTREMITIES:  He has no clubbing, cyanosis or edema.   LABORATORY DATA:  Upon admission, sodium 140, potassium 3.8, chloride  106, bicarb 25, BUN 13, creatinine 0.7, glucose of 118.  WBC 7.0,  hemoglobin 14.4, platelet count of 211, lactic acid is elevated at 4.2.  Urinalysis is negative.  Two sets of point of care markers are negative  and INR of 1.0.  A chest x-ray that shows a right middle lobe air space  disease that is unchanged from x-ray done yesterday with new atelectasis  versus air space disease at the medial left lung base.  A CT head that  shows remote infarcts, but no acute intracranial abnormalities.  An EKG  done in the emergency department shows sinus rhythm with first degree AV  block.   ASSESSMENT AND PLAN:  1. Aspiration pneumonia which is secondary to his choking episode.  He       has already been evaluated by critical care in the emergency      department and has been started on Cipro and Flagyl.  We will      repeat a chest x-ray in the morning.  We will panculture.  We will      also keep n.p.o. until speech therapy can evaluate him for a full      swallow evaluation.  2. Lactic acidosis which is secondary to his respiratory arrest.  We      will recheck his lactic acid level in the morning.  3. Hypothyroidism.  We will check a TSH.  Since he is n.p.o., we will      continue his Synthroid at half-normal dose IV.  4. Diabetes.  We will check an A1c.  Continue his home dose of Lantus.  5. Prophylaxis.  While in the hospital, he will be on Protonix for      gastrointestinal prophylaxis and Lovenox for deep venous thrombosis      prophylaxis.      Peggye Pitt, M.D.  Electronically Signed     EH/MEDQ  D:  10/28/2008  T:  10/28/2008  Job:  [id number]   cc:   Erskine Speed, M.D.

## 2010-09-05 NOTE — Consult Note (Signed)
NAME:  [name], STAGE NO.:  [id number]   MEDICAL RECORD NO.:  [id number]          PATIENT TYPE:  INP   LOCATION:  1824                         FACILITY:  [facility]   PHYSICIAN:  Adolph Pollack, M.D.DATE OF BIRTH:  19-Jan-1936   DATE OF CONSULTATION:  02/20/2008  DATE OF DISCHARGE:                                 CONSULTATION   REASON:  Small bowel obstruction.   HISTORY:  This is a 75 year old male, nursing home resident, who states  for 2 days, he has had lower abdominal discomfort and then began having  some nausea and vomiting.  He has been having this typical bowel  movements.  In fact, he had a very large bowel movement here in the  emergency department.  Because of his pain and vomiting, he was brought  to the emergency room.  While here, he was evaluated with abdominal x-  rays which were consistent with small bowel obstruction.  A CT scan was  performed which again was consistent with what appeared to be a small  bowel obstruction with a transition zone.  It is at this point that I  was asked to see him.   PAST MEDICAL HISTORY:  1. Diabetes mellitus.  2. Hypertension.  3. Cerebrovascular accident.  4. Peripheral vascular disease.  5. Right internal carotid artery stenosis.  6. Coronary artery disease.  7. Paroxysmal atrial fibrillation.  8. Dyslipidemia.  9. Hypothyroidism.  10.Ventilatory-dependent respiratory failure.  11.COPD.  12.Pneumonia.   PREVIOUS ABDOMINAL OPERATIONS:  1. Aortofemoral bypass.  2. Gastrostomy.   ALLERGIES:  Penicillin.   MEDICATIONS:  Synthroid, Plavix, Lasix, Cymbalta, Vicodin,  antihypertensive he cannot remember the name.   SOCIAL HISTORY:  He is a former smoker.  Nursing home resident.  He has  family with him here.   REVIEW OF SYSTEMS:  GENITOURINARY:  He states he is not having any  dysuria and he voids on his own without difficulty.  HEENT:  He had left  eye enucleation in the past.  He is also hard of  hearing.   PHYSICAL EXAMINATION:  GENERAL:  An elderly male appears to be in no  acute distress.  He is hard of hearing.  VITAL SIGNS:  Temperature is 98.3, blood pressure is 112/61, pulse 89.  HEENT:  Normal apart from absence of the left eye.  RESPIRATORY:  He has got increased AP diameter.  CHEST:  Breath sounds are equal and clear.  ABDOMEN:  Soft.  It is not distended and hypoactive bowel sounds are  noted.  There is a well-healed midline scar without hernia.  There is  mild left mid abdominal tenderness.  MUSCULOSKELETAL:  There is a healing ulceration on the right heel.  Multiple scars on his legs.   LABORATORY DATA:  White cell count 12,300, hemoglobin 12.8, platelet  count 213,000.  Glucose is 296, BUN 23, creatinine 0.9.  Liver function  tests normal except for slight elevation of alkaline phosphatase at 134  and a depressed albumin at 2.9.   IMPRESSION:  Partial small bowel obstruction versus gastroenteritis with  an ileus - he has distal air on his  plain films and is having a large  bowel movements.  Clinical course is not quite fit with a radiographic  picture at this time.   RECOMMENDATIONS:  I put the PEG tube to low intermittent suction,  hydrate him.  We will repeat his x-rays on February 21, 2008, will have a  dry dressing to the left heel wound.      Adolph Pollack, M.D.  Electronically Signed     TJR/MEDQ  D:  02/20/2008  T:  02/21/2008  Job:  [id number]

## 2010-09-05 NOTE — Discharge Summary (Signed)
NAME:  [name], [name] NO.:  [id number]   MEDICAL RECORD NO.:  [id number]          PATIENT TYPE:  INP   LOCATION:  4730                         FACILITY:  [facility]   PHYSICIAN:  Elliot Cousin, M.D.    DATE OF BIRTH:  [birth year]   DATE OF ADMISSION:  02/21/2008  DATE OF DISCHARGE:                               DISCHARGE SUMMARY   ANTICIPATED DATE OF DISCHARGE:  November 9 or March 02, 2008.   DISCHARGE DIAGNOSES:  1. Small bowel obstruction.  Resolving without surgical intervention      (The patient did not want operative repair if it were needed).  2. Pneumonia.  3. Chronic atrial fibrillation.  4. Type 2 diabetes mellitus.  5. Protein calorie malnutrition.  6. Multifactorial anemia.  7. Hypothyroidism.  8. Chronic left heel ulcer  9. Recent stroke with left-sided hemiparesis and resolved dysphagia.      The patient still has a PEG.   DISCHARGE MEDICATIONS:  1. Synthroid 100 mcg daily.  2. Furosemide 20 mg daily.  3. Plavix 75 mg daily.  4. Cymbalta 60 mg daily.  5. Bisoprolol 5 mg daily.  6. Amitriptyline 25 mg nightly.  7. Lantus insulin 15 units subcu nightly.  8. Avelox 400 mg daily for 3 more days.  9. Flagyl 500 mg t.i.d. for 3 more days.  10.Senokot-S 2 tablets nightly.  11.MiraLax laxative 17 grams added to 8 ounces of beverage daily as      needed for constipation.  12.Nu-Iron 1 tablet daily.  13.Multivitamin with iron 1 tablet daily.   DISCHARGE DISPOSITION:  The patient is approaching medical stability.  If he tolerates the advancement in his diet, the plan is to discharge  him to home on March 01, 2008 or March 02, 2008.   CONSULTATIONS:  Avel Peace and colleagues.   PROCEDURE PERFORMED:  1. Abdominal x-ray on February 27, 2008.  The results revealed mildly      dilated small bowel loops without significant change since prior      exam.  2. Right upper extremity PICC insertion by Interventional Radiology on      February 26, 2008.  The plan is to discontinue the PICC line prior      to discharge.  3. Chest x-ray on February 26, 2008.  The results revealed improving      aeration of the right upper lobe.  Persistent right lower lobe air      space disease/ pneumonia.  4. Acute abdominal series on February 25, 2008.  The results revealed      persistent partial small bowel obstruction.  No free air.  Chronic      interstitial markings at the bases on one view chest x-ray.  5..  CT scan of the abdomen and pelvis with contrast on February 03, 2008.  The results revealed findings consistent with a small bowel  obstruction with dilated small bowel throughout the abdomen.  Surgical  changes from aortal iliac bypass surgery.  Intra and extrahepatic  biliary dilatation.  This may be chronic or due to a distal stricture.  No pancreatic head mass is  seen.  There is severe fatty atrophy of the  pancreas.  Right lower pole renal calculus.  Possible mild edema or  fluid around the gallbladder.  Small bowel obstruction with transition  from dilated to nondilated small bowel in the right upper pelvis likely  due to adhesions.   HISTORY OF PRESENT ILLNESS:  The patient is a 75 year old man with a  past medical history significant for a previous stroke, chronic atrial  fibrillation, status post respiratory failure for aspiration pneumonia  in February 2009, who presented to the emergency department with  intractable diffuse abdominal pain.  Associated with the pain was nausea  and vomiting of nonbilious material.  The patient had a PEG tube placed  previously as a consequence of dysphagia from his prior stroke.  However,  the patient was not using the PEG tube and was apparently  eating without any problems.  When he was evaluated in the emergency  department, he was noted to be hemodynamically stable and afebrile.  His  urinalysis revealed moderate bilirubin and no leukocytes.  His white  blood cell count was slightly  elevated at 13.8.  A CT scan of the  abdomen and pelvis was ordered and it revealed findings consistent with  a small bowel obstruction.  The patient was therefore admitted for  further evaluation and management.   For additional details please see the dictated history and physical.   HOSPITAL COURSE:  1. SMALL BOWEL OBSTRUCTION.  The patient was started on empiric      antibiotic treatment with Flagyl via the PEG tube.  However,      shortly thereafter, the Flagyl was discontinued as there was      evidence of small bowel obstruction.  Maintenance IV fluids were      initiated.  Reglan was started at 5 mg IV every 6 hours.  The IV      fluids were eventually changed to D5 normal saline with potassium      chloride added as it was anticipated that the resolution of the      small bowel obstruction would be slow.  General surgeon Dr.      Abbey Chatters was consulted.  He evaluated the patient and the CT      scan. He informed the patient about the potential need for surgical      repair, however, the patient refused any possiblity of an      operation; his wife agreed with his decision. Dr. Abbey Chatters      recommended attaching the PEG tube to a suction device for      decompression and drainage.  He ordered follow-up x-rays daily.      For the first several days, the abdominal x-rays revealed no      significant changes.  The patient was somewhat tender and distended      on exam and therefore he was started on as-needed Dilaudid.  When      he became more confused on Dilaudid, it was discontinued and      subsequently morphine was ordered p.r.n.  The G tube did put out      quite a bit of bilious and sometimes it appeared to be a fecal like      material.  When he became febrile several days later, Cipro and      Flagyl were started empirically.  Blood cultures were ordered prior      to the administration of the antibiotics.  Another urinalysis was  ordered and was unremarkable.   Two days ago, the patient's abdomen      became somewhat less distended and he was significantly less      symptomatic.  An abdominal x-ray on November 6 revealed no acute      changes in the dilatation of the bowel loops.  However, clinically,      the patient's small bowel obstruction was resolving.  He also began      having bowel movements spontaneously.  Clear liquid diet was      started yesterday and he tolerated it well.  The G tube is still      attached to the Foley catheter for drainage and 24 hours ago, it      drained approximately 900 mL.  The G tube will be clamped today.      The clear liquid diet will continue.  He will be monitored closely      for tolerance.  Because of his prolonged n.p.o. status, TNA was      initiated yesterday for treatment of malnutrition.  The TNA will      eventually be titrated off over the next 48 hours.  2. PNEUMONIA.  As indicated above, the patient became febrile several      days ago.  Blood cultures were ordered, an urinalysis was      reordered, and a chest x-ray was obtained.  So far, his blood      cultures have remained negative.  His urine culture grew out only      15,000 colonies of Proteus.  His chest x-ray revealed persistent      right lower lobe air space disease consistent with pneumonia.  As      indicated above, the patient was started on Flagyl and Cipro      empirically.  Cipro was discontinued and Avelox was started instead      to cover pneumonia.  Over the past 48 hours, the patient has been      completely afebrile.  His white blood cell count has normalized to      7.5.  As of today, he has received 4 days of antibiotic therapy      with Avelox and 5 days of therapy with Flagyl.  Upon discharge, he      will be continued on Avelox and Flagyl orally for three more days.  3. TYPE 2 DIABETES MELLITUS.  The patient was treated with sliding      scale NovoLog and Lantus during the hospitalization.  His capillary      blood  glucose has been well-controlled.  With the administration of      TNA, additional insulin was added to the mixture.  His hemoglobin      A1c was excellent at 5.8.  Upon discharge, the patient will resume      Lantus therapy.  4. HYPERTENSION.  The patient's blood pressure has been well-      controlled during the hospitalization.  When he was made n.p.o.,      intravenous Lopressor was administered every 8 hours.  The      intravenous Lopressor has been discontinued today and he will be      restarted on bisoprolol 5 mg daily, his usual dose.  5. LEFT HEEL ULCER.  The patient has had a chronic left heel ulcer for      a number of months.  Upon examination, it appears to be healing.  The nursing staff was advised to float the heel off of the bed to      relieve pressure.  A simple dry dressing has been applied daily.  6. CHRONIC ATRIAL FIBRILLATION.  The patient's heart rate has been      well-controlled during the hospitalization.  As indicated above, he      was initially treated with intravenous Lopressor.  As of today, the      Lopressor has been discontinued and bisoprolol will be restarted.      Plavix will be restarted as well.  7. CHRONIC ANEMIA.  The patient's hemoglobin was 12.8 at the time of      the initial hospital assessment.  With IV fluid volume repletion,      it fell to a nadir of 9.5.  There was no evidence of gross GU or GI      bleeding.  His TSH was within normal limits at 2.447.  The patient      will continue to be treated supportively with a multivitamin with      iron and Nu-Iron.  Further outpatient evaluation will be deferred      to his primary care physician.   ADDENDUM DISPOSITION:  The patient is approaching medical stability.  If  he is able to tolerate the advancement in his diet, he could be  discharged home tomorrow or March 02, 2008.  Both the occupational  and physical therapists evaluated the patient.  However, according to  the patient's  wife, his wound care physician did not want him to  ambulate on the left heel ulcer.  Skilled nursing has been ordered for  home.      Elliot Cousin, M.D.  Electronically Signed     DF/MEDQ  D:  02/29/2008  T:  02/29/2008  Job:  [id number]   cc:   Adolph Pollack, M.D.

## 2010-09-05 NOTE — Consult Note (Signed)
NAME:  [name], FILES NO.:  [id number]   MEDICAL RECORD NO.:  [id number]          PATIENT TYPE:  INP   LOCATION:  5529                         FACILITY:  [facility]   PHYSICIAN:  Nelda Bucks, MD DATE OF BIRTH:  [birth year]   DATE OF CONSULTATION:  10/28/2008  DATE OF DISCHARGE:                                 CONSULTATION   REASON FOR CONSULTATION:  Choking/altered mental status/aspiration  pneumonia.   HISTORY OF PRESENT ILLNESS:  This is a 75 year old man with diabetes,  hypertension, and hyperlipidemia as well as history of CVA with residual  weakness on the left side.  The patient was eating rice and chicken with  his wife this morning around 11:30 when he suddenly had a chocking  sensation and he passed out.  Wife reported he had performed Heimlich  maneuver and called EMS.  EMS found the patient to be unresponsive, but  he started to awaken on his own.  During this time, he vomited several  times.  Currently, the patient says he is feeling fine and he is near  about his baseline.  He denies any chest pain, breathing problems, any  new focal deficits, fever, bowel or bladder discomfort, or any other  systemic complaints.  The patient has had choking with food after his  last stroke, per the family.  He had been seen in his primary care  doctor's office yesterday and was given an antibiotic for possible  pneumonia.  He was otherwise doing okay.   ALLERGIES:  PENICILLIN, which causes hives.   PAST MEDICAL HISTORY:  1. CVA with left-sided residual weakness and swallowing deficits, h/o      MRSA infection in the past.  2. Diabetes.  3. Hypertension.  4. Hyperlipidemia.  5. Hypothyroidism.  6. Chronic atrial fibrillation.   HOME MEDICATIONS:  1. Lantus 15 subcutaneous daily.  2. Cymbalta 60 mg once daily.  3. Lasix 20 mg once daily.  4. Bisoprolol 5 mg once daily.  5. Synthroid 100 mcg once daily.  6. Plavix 75 mg once daily.  7. Amitriptyline 25 mg  once daily.  8. Darvocet as needed.   SOCIAL HISTORY:  The patient lives with his wife.  He is an ex-smoker.  There is a history of alcohol or drug abuse.   FAMILY HISTORY:  Noncontributory.   REVIEW OF SYSTEMS:  As per HPI.   PHYSICAL EXAMINATION:  VITAL SIGNS:  Blood pressure 117/71, pulse 78 per  minutes, respiratory rate 20, temperature 97.6, saturation 98% on room  air.  GENERAL:  Not in any distress next.  HEENT:  Normocephalic, atraumatic.  Left eye is blind.  Right pupil is  reactive.  Mucous membranes moist.  NECK:  Supple.  CHEST:  Coarse breath sounds bilaterally.  CARDIOVASCULAR:  Regular rate and rhythm.  ABDOMEN:  Soft, nontender.  EXTREMITIES:  No edema.  NEUROLOGIC:  Residual weakness on the left side.  No sensory deficit.  Alert and oriented with normal speech.   LABORATORY STUDIES:  1. Chest x-ray, right middle lobe consolidation with left medial      atelectasis versus airspace disease.  2.  ECG, negative for any acute changes.  3. X-ray of the neck, normal soft tissues.  4. Hemoglobin 14.4, WBC 7, platelet 211.  Other blood work pending.   ASSESSMENT AND PLAN:  This is a 75 year old white man with possible  aspiration pneumonia associated with syncopal episode, which most likely  resulted from his swallowing deficits.  It would be prudent to rule out  a new stroke.  Other things to consider are possible hypoglycemic  episodes.  We do not think this is related to cardiac events or seizure  disorders.  We recommend treating his pneumonia providing adequate  coverage for anaerobes and MRSA. He can be started on ciprofloxacin and  Flagyl and Vancomycin.  He will need a swallowing evaluation and we  recommend to keep him n.p.o. until cleared for swallowing.  His lactic  acidosis most likely related to hypoxic event related to choking.  We  recommend checking lactic acid in the morning.  We will also repeat a  chest x-ray in the morning.  He can be admitted  under Internal Medicine  Team and we will be happy to follow along closely.  Thank you very much  for this interesting consult.      Zara Council, MD  Electronically Signed      Nelda Bucks, MD  Electronically Signed    AS/MEDQ  D:  10/28/2008  T:  10/29/2008  Job:  [id number]
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
## 2010-09-08 NOTE — Op Note (Signed)
NAME:  [name], [name] NO.:  [id number]   MEDICAL RECORD NO.:  [id number]                   PATIENT TYPE:  OIB   LOCATION:  2875                                 FACILITY:  [facility]   PHYSICIAN:  Lorre Munroe., M.D.            DATE OF BIRTH:  11/27/1935   DATE OF PROCEDURE:  05/14/2003  DATE OF DISCHARGE:  05/14/2003                                 OPERATIVE REPORT   PREOPERATIVE DIAGNOSIS:  Gangrene of the distal portion of the left ring  finger.   POSTOPERATIVE DIAGNOSIS:  Gangrene of the distal portion of the left ring  finger.   OPERATION:  Partial amputation, left ring finger.   SURGEON:  Lebron Conners, M.D.   ANESTHESIA:  Digital block and sedation.   DESCRIPTION OF PROCEDURE:  After the patient was lightly sedated and had  routine preparation and draping of the left hand and wrist, I liberally  infused long-acting local anesthetic at the base of the ring finger,  achieving a good digital block.  I made a transversely-oriented fishmouth-  type incision across the middle phalanx, dissecting with the knife and with  the cautery.  There was good bleeding from the skin.  The digital vessels  responded well to cautery control.  I divided the tendons slightly  proximally.  I divided the bone with a bone cutter slightly proximal to the  skin incision after clearing the periosteum for a short distance.  There was  no sign of infection at the site of amputation.  After getting good  hemostasis and assuring that skin could be closed without tension, I closed  it with interrupted 4-0 nylon sutures using a combination of mattress and  simple sutures.  He tolerated the operation well.  I applied a bulky  bandage.                                               Lorre Munroe., M.D.    Jodi Marble  D:  05/14/2003  T:  05/15/2003  Job:  [id number]
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
## 2010-09-08 NOTE — Op Note (Signed)
NAME:  [name], [name] NO.:  [id number]   MEDICAL RECORD NO.:  [id number]                   PATIENT TYPE:  OIB   LOCATION:  2899                                 FACILITY:  [facility]   PHYSICIAN:  Lorre Munroe., M.D.            DATE OF BIRTH:  13-Jul-1935   DATE OF PROCEDURE:  07/07/2003  DATE OF DISCHARGE:  07/07/2003                                 OPERATIVE REPORT   PREOPERATIVE DIAGNOSIS:  Nonhealing amputation, left ring finger.   POSTOPERATIVE DIAGNOSIS:  Nonhealing amputation, left ring finger.   OPERATION:  Reamputation of the left ring finger through the proximal  phalanx.   SURGEON:  Lebron Conners, M.D.   ANESTHESIA:  General.   DESCRIPTION OF PROCEDURE:  After the patient was monitored and anesthetized  and had routine preparation and draping of the left hand, I made an incision  oriented transversely slightly fishmouth in orientation over the proximal  phalanx of the finger.  I used the cautery to obtain hemostasis and go down  through the subcutaneous tissue.  I thoroughly cauterized the digital  vessels.  I then sharply incised the tendons after pulling them down a bit.  I went straight down to the bone all around and divided it transversely with  a bone cutter and then trimmed sharp edges with a rongeur.  I thoroughly  irrigated the wound and assured good hemostasis.  My closure was with  interrupted mattress and simple 3-0 nylon sutures, providing good skin  approximation.  There was no tension on the skin.  I applied a bulky,  slightly compressive bandage.  He tolerated the operation well.                                               Lorre Munroe., M.D.    Jodi Marble  D:  07/07/2003  T:  07/08/2003  Job:  [id number]
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
## 2010-09-08 NOTE — Discharge Summary (Signed)
NAME:  [name] NO.:  [id number]   MEDICAL RECORD NO.:  [id number]                   PATIENT TYPE:  INP   LOCATION:  2905                                 FACILITY:  [facility]   PHYSICIAN:  Sherin Quarry, MD                   DATE OF BIRTH:  1936/01/15   DATE OF ADMISSION:  03/09/2003  DATE OF DISCHARGE:  03/11/2003                                 DISCHARGE SUMMARY   HISTORY OF PRESENT ILLNESS:  [name] is a 75 year old male with a  15 year history of diabetes which prior to admission was managed with a  regimen of Glucophage and Amaryl. Mr. Roger was doing well until Sunday  prior to admission when he began to experience severe vomiting and diarrhea  which persisted to about 4 a.m. the next morning. The next day, he felt  extremely weak and was unable to eat or take any medications. Ultimately, he  came into [facility] emergency room where laboratory studies revealed the  patient to have ketoacidosis with a CO2 of 9. He was therefore admitted for  management of this problem.   PHYSICAL EXAMINATION:  GENERAL:  At the time of admission, he was a  pleasant, alert man in no acute distress.  HEENT:  Within normal limits.  CHEST:  Clear.  BACK:  Examination of the back revealed no CVA or point tenderness.  CARDIOVASCULAR:  Revealed normal S1 and S2 with rubs, murmurs, or gallops.  ABDOMEN:  Benign.  NEUROLOGIC/EXTREMITIES:  Neurologic testing and examination of the  extremities was normal.   LABORATORY DATA:  Serum sodium was 131, potassium 3.8, CO2 9, glucose 255,  creatinine 1.8, BUN 18. White count was 21,000, hemoglobin 16.2. Urine  revealed greater than 1,000 mg/dl of glucose, greater than 80 mg/dl of  ketones. Chest film was performed in the emergency room which was within  normal limits. A KUB of the abdomen was also normal. Electrocardiogram  initially revealed a pattern of atrial fibrillation which subsequently  converted to a  sinus mechanism.   HOSPITAL COURSE:  On admission, the patient was placed on a Glucomander  protocol as well as vigorous IV hydration. He received 2 liters of normal  saline followed by a solution of normal saline with 20 mEq of KCl which ran  at 250 cc per hour. The electrolytes and CO2 were very carefully monitored.  The next day, the patient's regimen was converted to one of Lantus insulin  20 units daily and a sliding scale regimen. The patient did well. He resumed  a normal diet and indicated that by November 18 he was feeling entirely  well. Therefore, the patient was discharged at that time. Prior to his  discharge, I checked an A1c hemoglobin which was 12.8. This would suggest to  me that the patient's blood sugar regulation has not been very good in the  last few months. On November 18, the  patient was discharged.   DISCHARGE DIAGNOSES:  1. Diabetic ketoacidosis.  2. Diabetes mellitus.  3. Hyperlipidemia.  4. Hypertension.  5. Degenerative joint disease.  6. Hypothyroidism.  7. Peripheral vascular disease.  8. History of amputations.   DISCHARGE MEDICATIONS:  1. At the time of discharge, I will advise the patient to take Lantus     insulin 25 units daily and also to continue Glucophage, taking 1 g in the     a.m. and 1 g in the p.m. He will also continue: 2.  Lipitor 10 mg daily.  2. Diltiazem 180 mg daily.  3. Toprol 25 mg daily.  4. Elavil 75 mg at bedtime.  5. Synthroid 75 mcg daily.  6. Pletal 50 mg b.i.d.   I will instruct him to hold Lasix and Amaryl. I strongly encouraged him to  return to Dr. Thomasene Lot office in 7 to 10 days so that his blood sugar  regulation could be evaluated. It is quite likely that his Lantus insulin  dosage will subsequently need to be increased.   CONDITION ON DISCHARGE:  Good.                                                Sherin Quarry, MD    SY/MEDQ  D:  03/11/2003  T:  03/12/2003  Job:  [id number]   cc:   Erskine Speed, M.D.   [address]  [place]  [place] [address]  Fax: [phone]
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
## 2010-09-10 IMAGING — CR DG CHEST 2V
2 series · 2 of 2 positions shown · non-contrast
Comparison: 05/01/2008.

CLINICAL DATA: Cough.

CHEST - 2 VIEW

[view not recorded (1 of 2)]
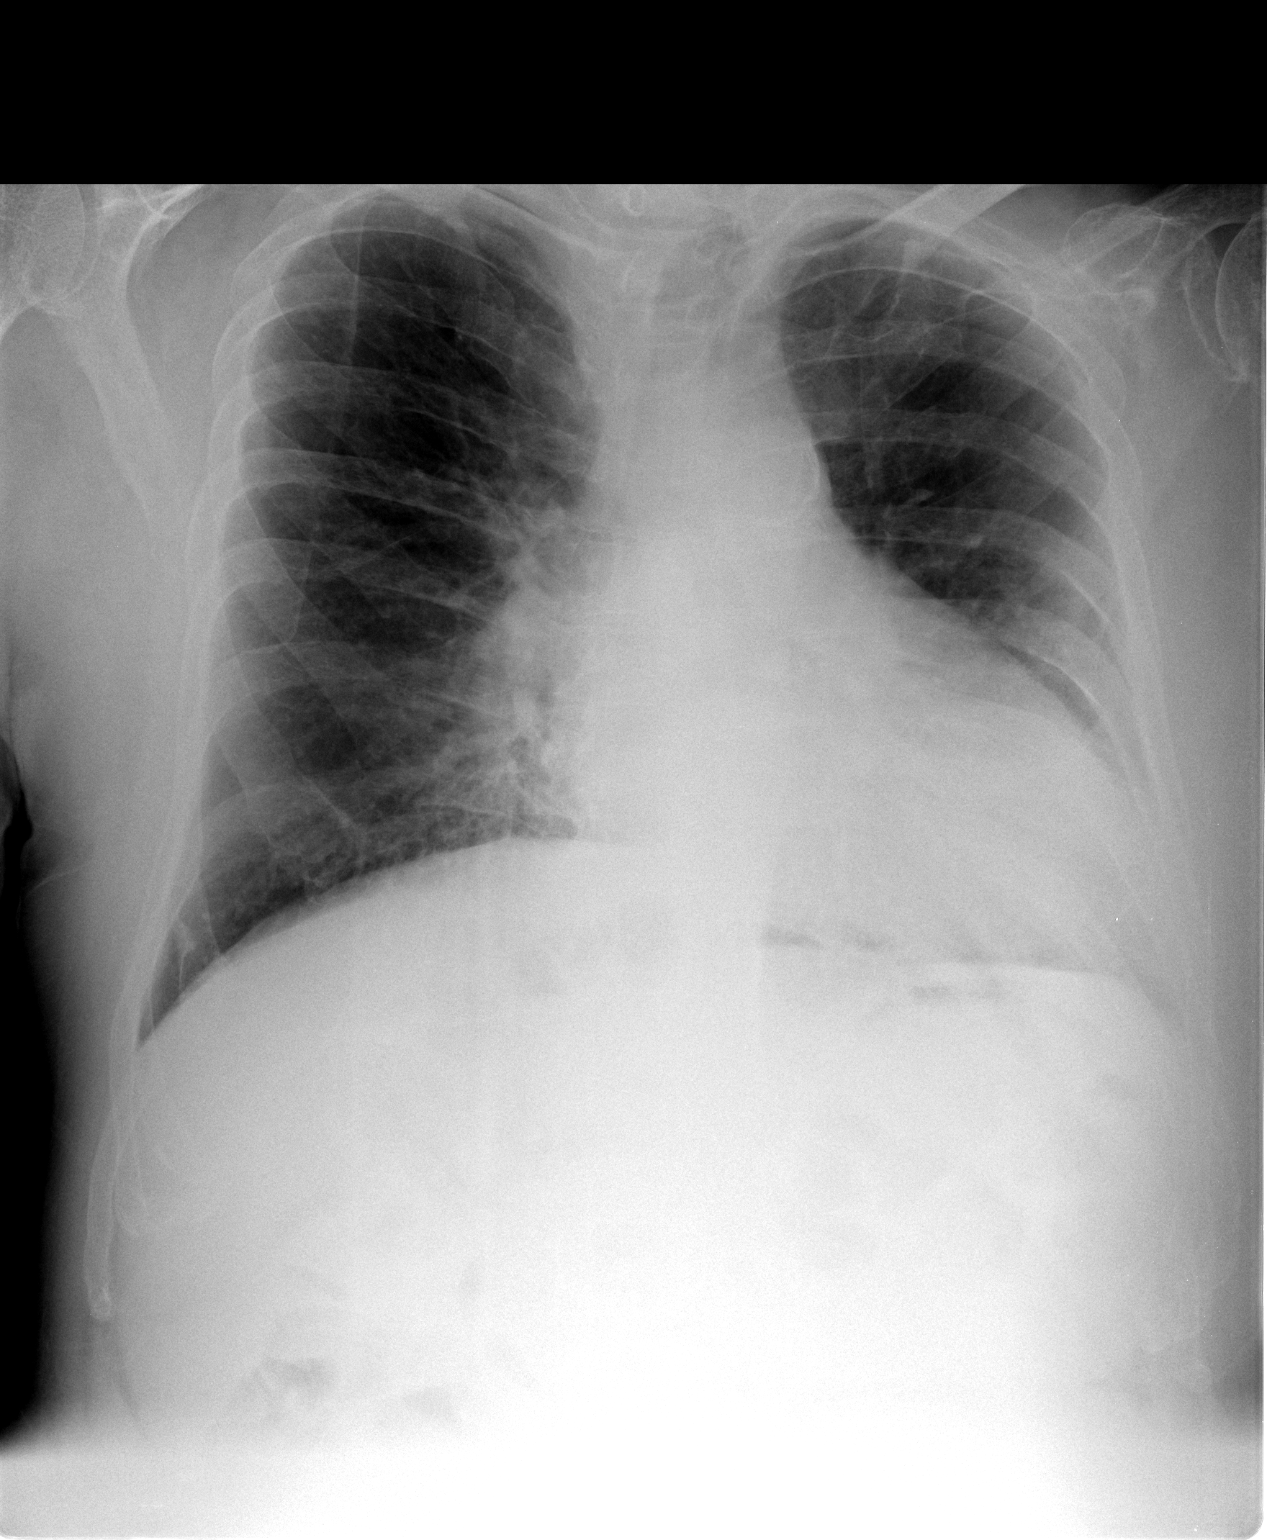

[view not recorded (2 of 2)]
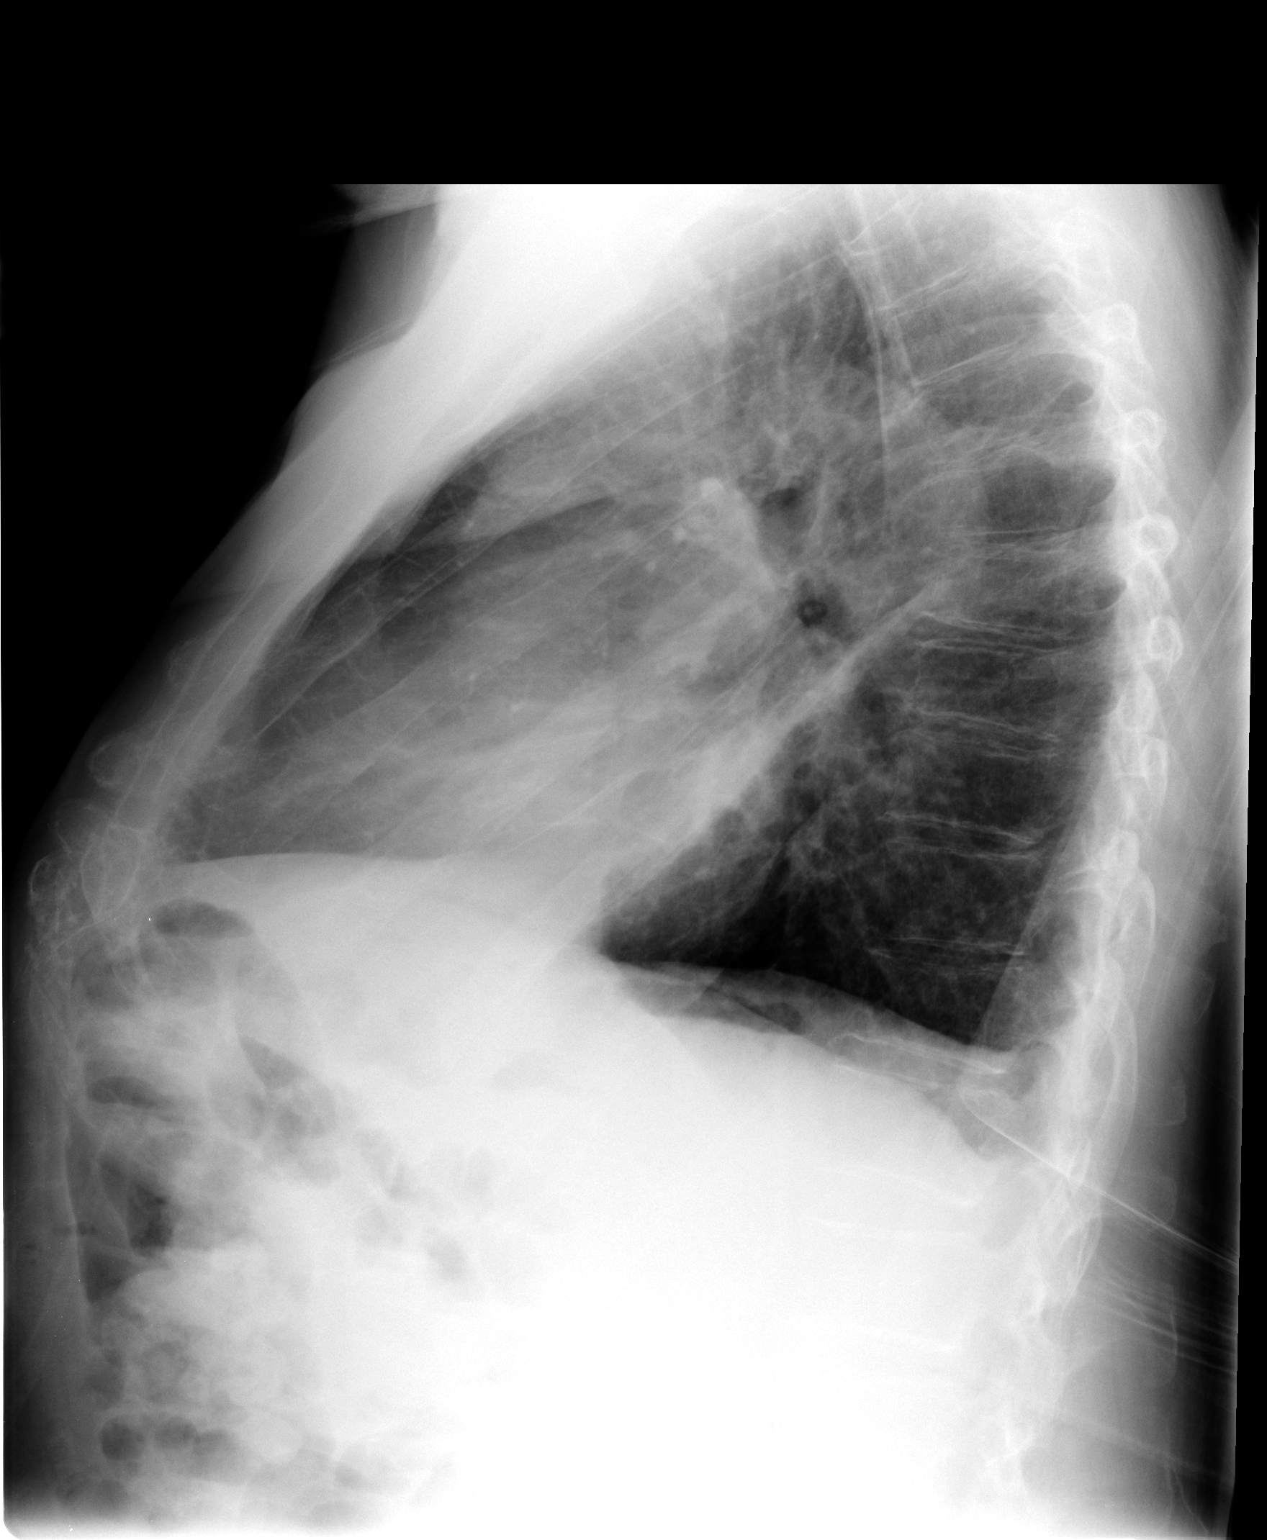

[2 of 2 positions shown; findings below may reference images not displayed]

FINDINGS: Two-view exam performed with the patient in a wheelchair.
Airspace disease in the medial right middle lobe is suspicious for
pneumonia.  Left lung is clear. The cardiopericardial silhouette is
enlarged. Imaged bony structures of the thorax are intact.
IMPRESSION: Focal airspace disease in the right middle lobe suggests pneumonia.

## 2010-09-11 IMAGING — CR DG NECK SOFT TISSUE
1 series · 1 of 1 positions shown · non-contrast
Comparison: Chest radiograph 10/28/2008

CLINICAL DATA: Choked on chicken.  Respiratory distress.

NECK SOFT TISSUES - 1+ VIEW

[c spine lat]
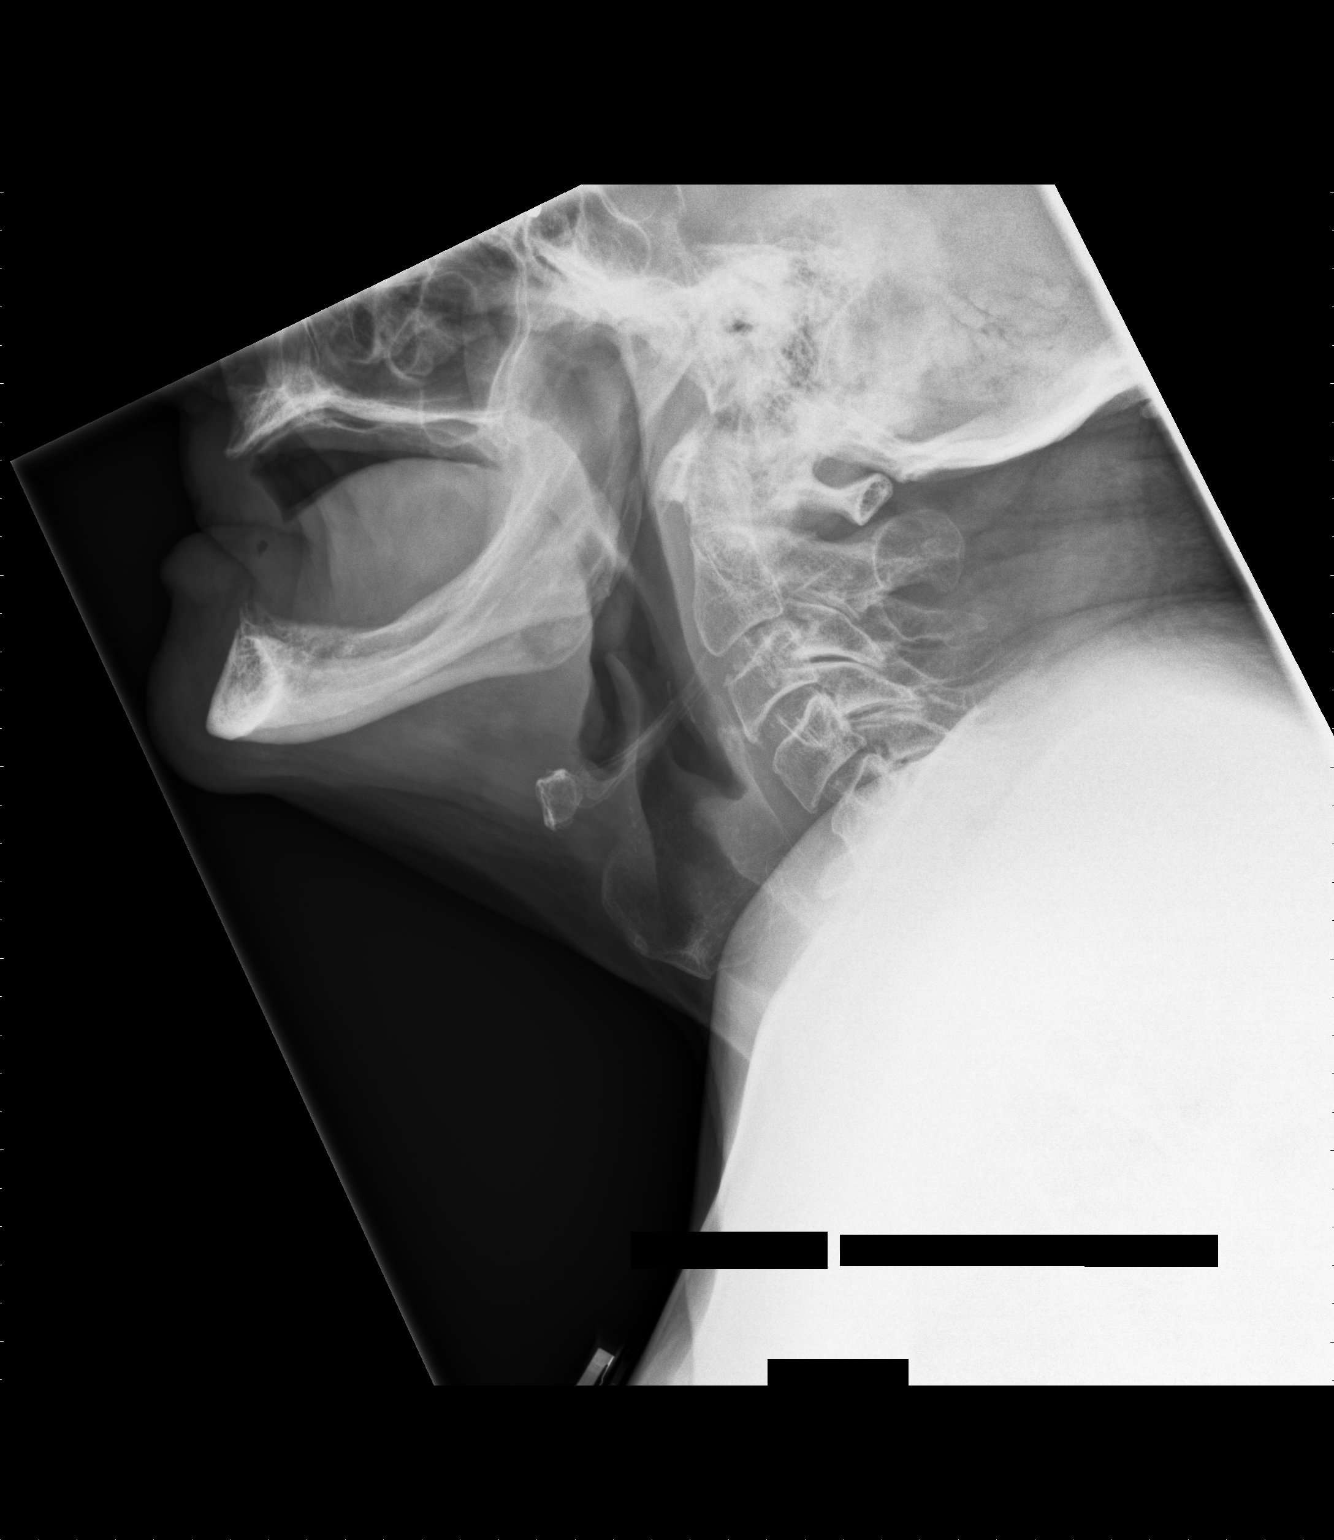

[1 of 1 positions shown; findings below may reference images not displayed]

FINDINGS: A lateral view of the soft tissues of the neck is
performed.  The neck is evaluated from the skull base through
superior aspect of C5.  The prevertebral soft tissue contour is
normal.  The epiglottic contour is normal.  The hypopharynx appears
normally aerated.  The visualized tracheal column is unremarkable.
The bones are osteopenic.  The patient is adentulous.

No unexpected radiopaque foreign body is identified.
IMPRESSION: Soft tissues of the neck within normal limits.
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
## 2010-09-11 IMAGING — CR DG CHEST 1V PORT
1 series · 1 of 1 positions shown · non-contrast
Comparison: Chest x-ray 10/27/2008

CLINICAL DATA: Vomiting.  Respiratory arrest status post choking

PORTABLE CHEST - 1 VIEW

[AP]
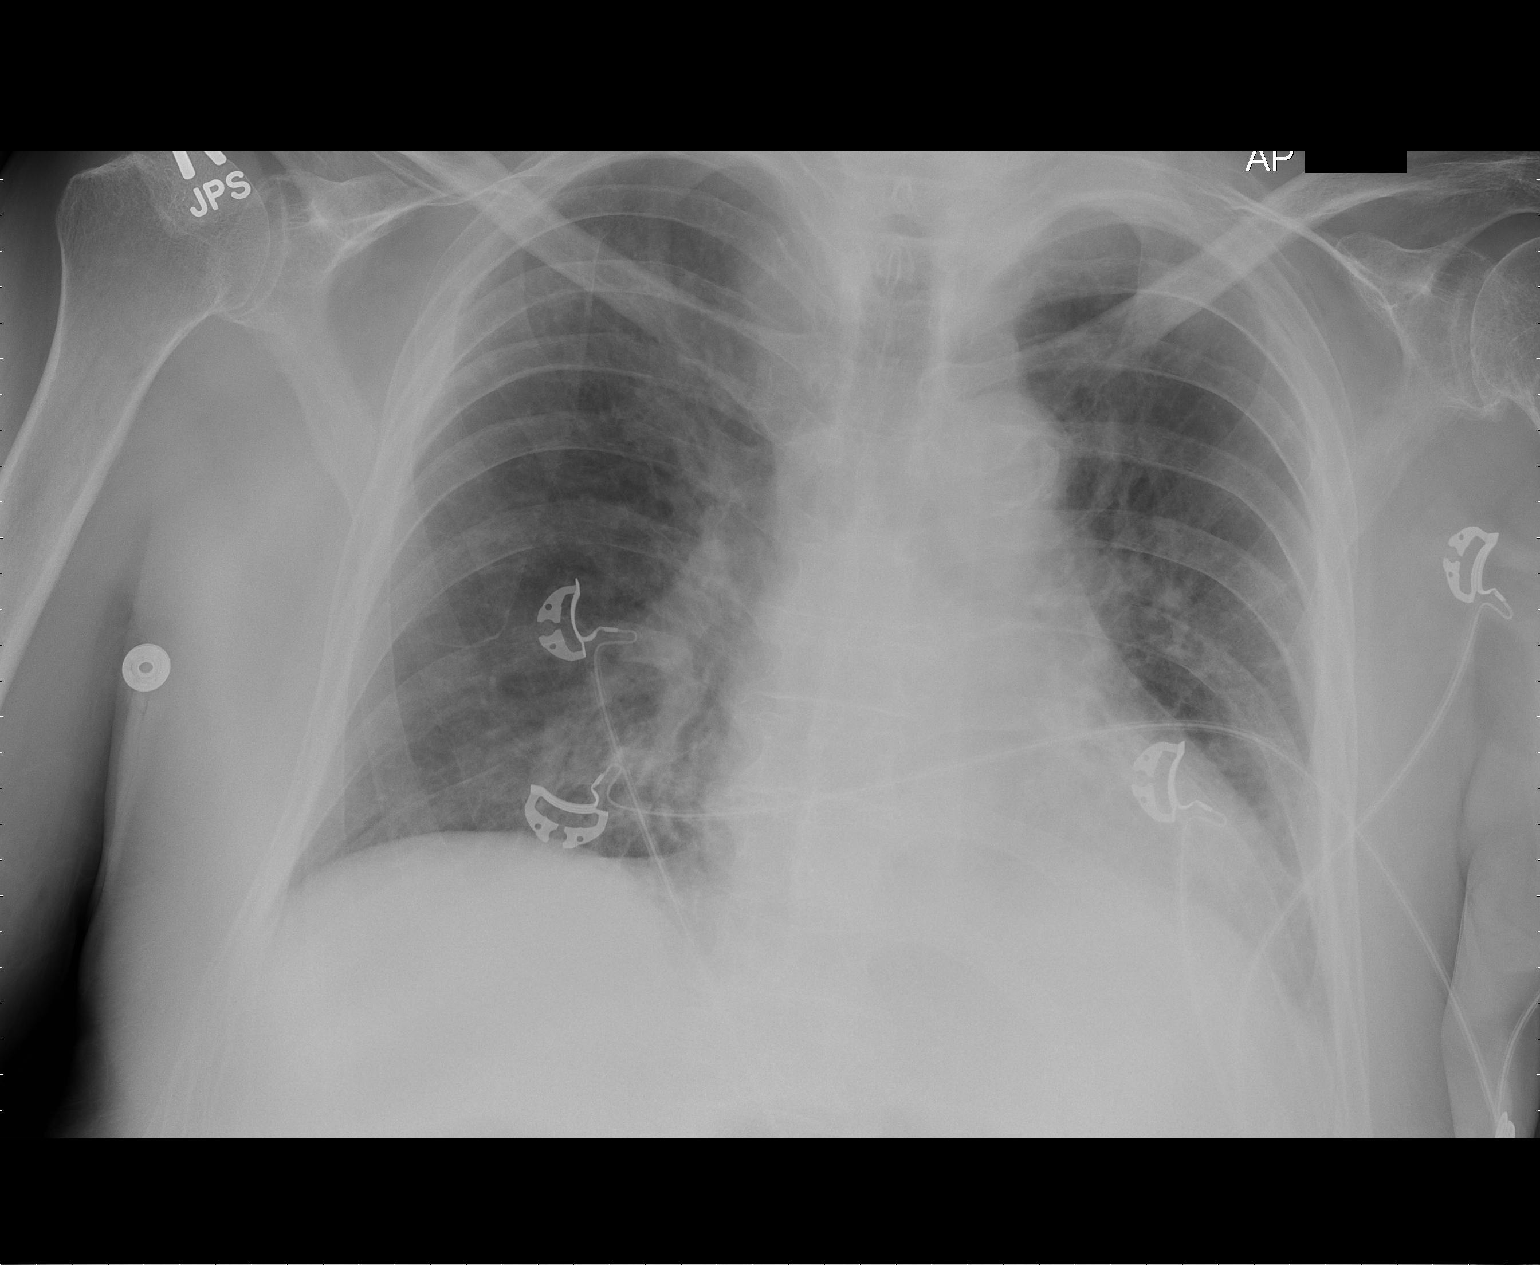

[1 of 1 positions shown; findings below may reference images not displayed]

FINDINGS: Patient is slightly rotated to the left.  Lung volumes
are low.  There is a persistent airspace opacity and possible
bronchial wall thickening in the right middle lobe.  Atelectasis or
airspace disease at the medial left lung base is not excluded, as
the left hemidiaphragm is obscured in this region.  Heart  and
mediastinal contours are stable.  No edema or pleural effusion is
identified.
IMPRESSION: 1.  No significant change in right middle lobe airspace disease.
2.  Cannot exclude atelectasis versus early airspace disease in the
medial left lung base.

## 2010-09-12 IMAGING — CR DG CHEST 1V PORT
1 series · 1 of 1 positions shown · non-contrast
Comparison: 10/28/2008

CLINICAL DATA: Respiratory arrest after choking.  Aspiration.

PORTABLE CHEST - 1 VIEW

[view not recorded]
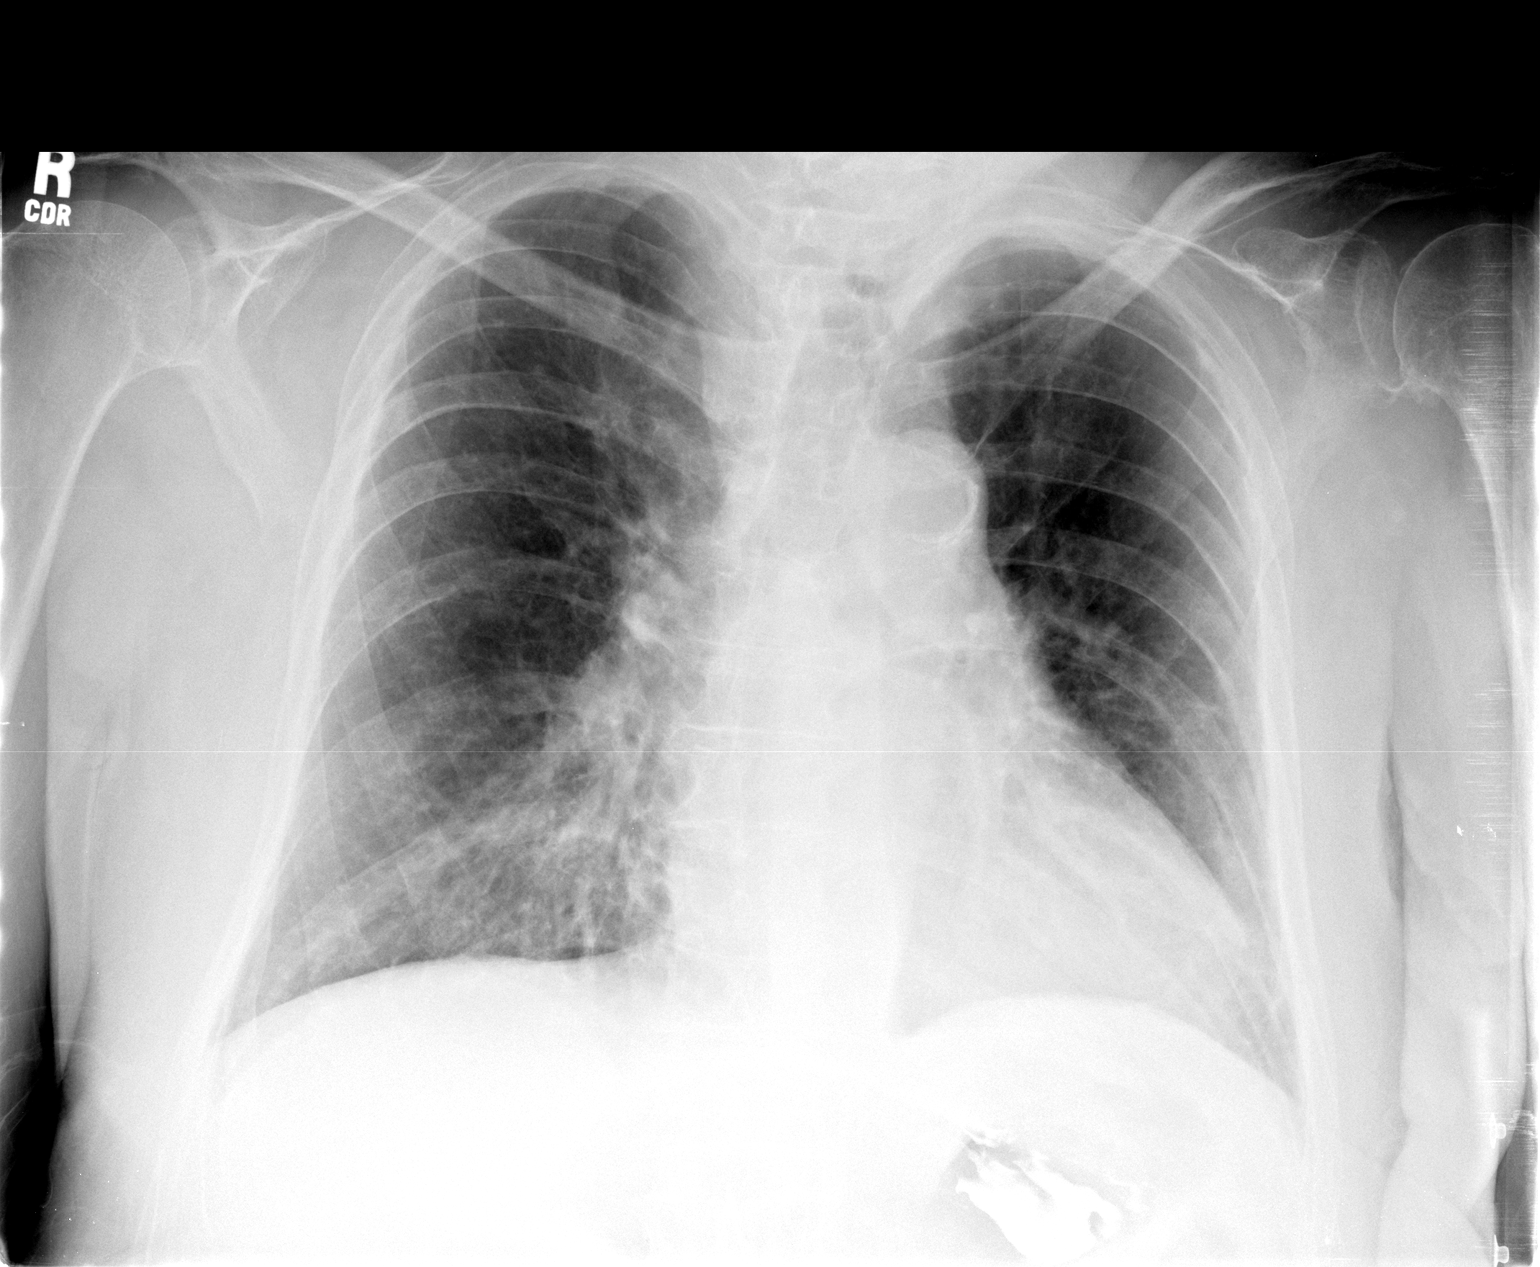

[1 of 1 positions shown; findings below may reference images not displayed]

FINDINGS: Trachea is midline.  Heart size stable.  There has been
interval improvement in aeration in the left lower lobe.  The lungs
are otherwise clear.  No pleural fluid.
IMPRESSION: No acute findings.

## 2010-12-18 IMAGING — CR DG CHEST 2V
2 series · 2 of 2 positions shown · non-contrast
Comparison: 12/06/2008

CLINICAL DATA: Pneumonia.

CHEST - 2 VIEW

[view not recorded (1 of 2)]
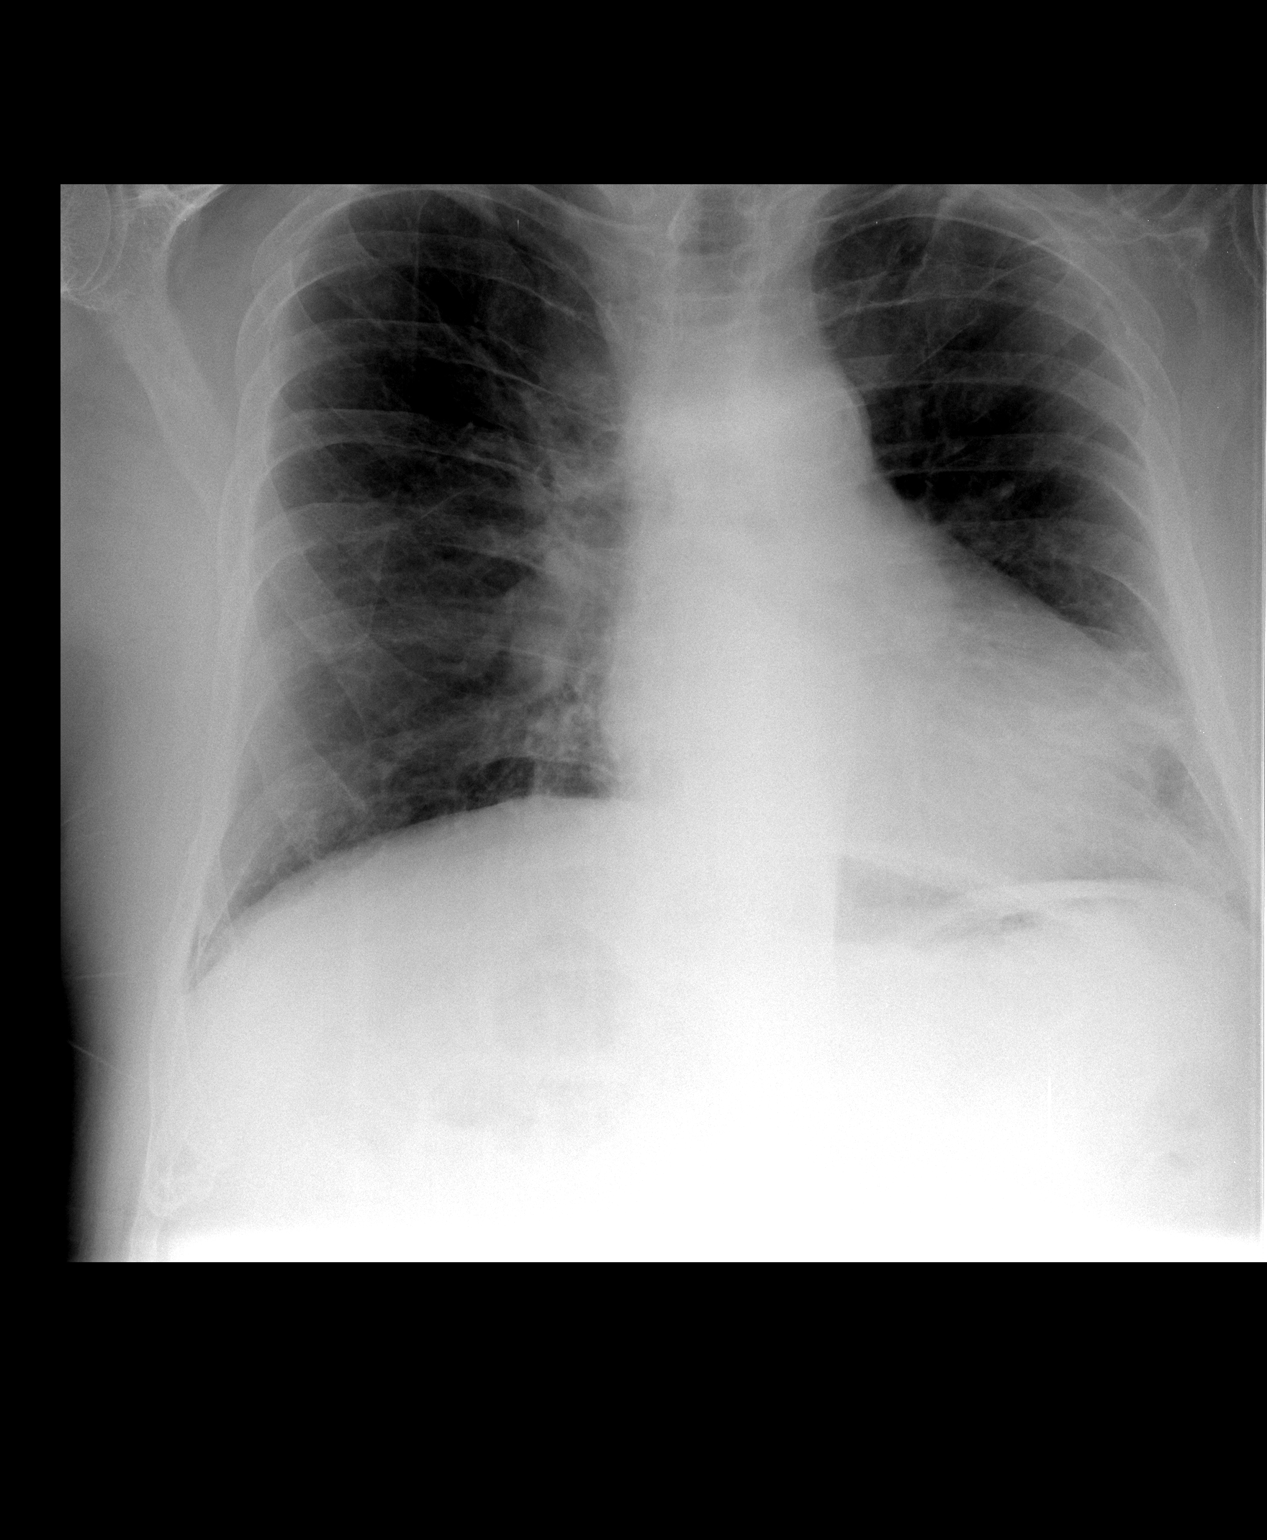

[view not recorded (2 of 2)]
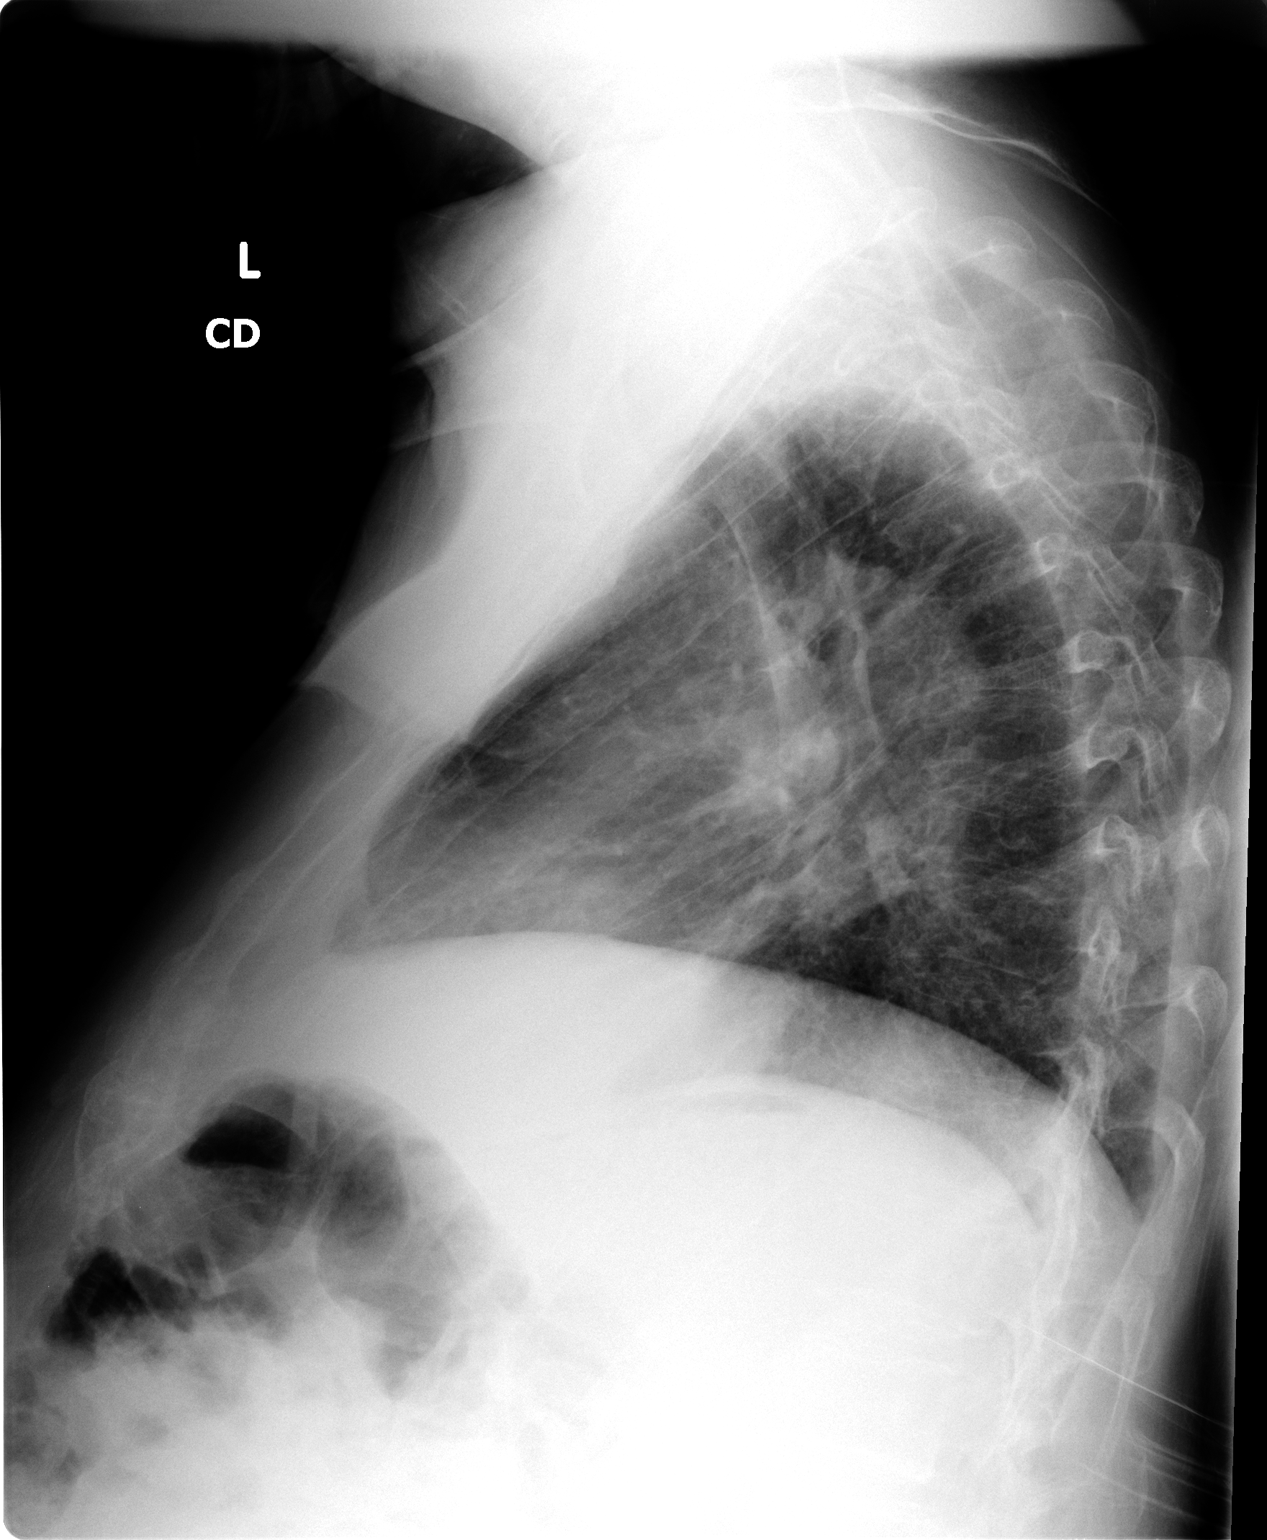

[2 of 2 positions shown; findings below may reference images not displayed]

FINDINGS: There is cardiomegaly.  Persistent lingular density noted
which could represent atelectasis or pneumonia.  No confluent
opacity on the right.  No effusions. No acute bony abnormality.
IMPRESSION: Cardiomegaly.

Persistent lingular atelectasis or pneumonia.

## 2011-01-10 LAB — URINALYSIS, ROUTINE W REFLEX MICROSCOPIC
Bilirubin Urine: NEGATIVE
Glucose, UA: NEGATIVE
Hgb urine dipstick: NEGATIVE
Protein, ur: NEGATIVE
Urobilinogen, UA: 1

## 2011-01-10 LAB — COMPREHENSIVE METABOLIC PANEL
ALT: 15
Alkaline Phosphatase: 76
CO2: 28
Chloride: 102
GFR calc non Af Amer: >60
Glucose, Bld: 154 — ABNORMAL HIGH
Potassium: 4.1
Sodium: 139
Total Bilirubin: 0.6

## 2011-01-10 LAB — CROSSMATCH

## 2011-01-10 LAB — CBC
Hemoglobin: 14
MCHC: 33.6
RBC: 4.53
WBC: 9.7

## 2011-01-10 LAB — PROTIME-INR
INR: 0.9
Prothrombin Time: 12.6

## 2011-01-10 LAB — DIFFERENTIAL
Basophils Relative: 0
Eosinophils Absolute: 0
Eosinophils Relative: 1
Neutrophils Relative %: 56

## 2011-01-10 LAB — URINE MICROSCOPIC-ADD ON

## 2011-01-11 LAB — CULTURE, RESPIRATORY W GRAM STAIN

## 2011-01-11 LAB — CBC
HCT: 17.9 — ABNORMAL LOW
HCT: 22.4 — ABNORMAL LOW
HCT: 22.9 — ABNORMAL LOW
HCT: 26.5 — ABNORMAL LOW
HCT: 27 — ABNORMAL LOW
HCT: 27.2 — ABNORMAL LOW
HCT: 27.6 — ABNORMAL LOW
HCT: 27.7 — ABNORMAL LOW
Hemoglobin: 6.1 — CL
Hemoglobin: 7.6 — CL
Hemoglobin: 7.7 — CL
Hemoglobin: 8.5 — ABNORMAL LOW
Hemoglobin: 8.7 — ABNORMAL LOW
Hemoglobin: 9 — ABNORMAL LOW
Hemoglobin: 9.3 — ABNORMAL LOW
Hemoglobin: 9.3 — ABNORMAL LOW
Hemoglobin: 9.4 — ABNORMAL LOW
Hemoglobin: 9.4 — ABNORMAL LOW
Hemoglobin: 9.6 — ABNORMAL LOW
MCHC: 33.8
MCHC: 33.9
MCHC: 33.9
MCHC: 33.9
MCHC: 34
MCHC: 34.2
MCHC: 34.3
MCHC: 34.4
MCHC: 34.4
MCHC: 34.5
MCV: 89.9
MCV: 90.9
MCV: 91.1
MCV: 91.2
MCV: 91.5
MCV: 91.7
MCV: 92.6
Platelets: 149 — ABNORMAL LOW
Platelets: 189
Platelets: 218
Platelets: 236
Platelets: 258
Platelets: 280
RBC: 1.95 — ABNORMAL LOW
RBC: 2.48 — ABNORMAL LOW
RBC: 2.7 — ABNORMAL LOW
RBC: 2.81 — ABNORMAL LOW
RBC: 2.88 — ABNORMAL LOW
RBC: 2.95 — ABNORMAL LOW
RBC: 3.05 — ABNORMAL LOW
RBC: 3.37 — ABNORMAL LOW
RDW: 13.9
RDW: 14
RDW: 14.2
RDW: 14.2
RDW: 14.5
RDW: 14.8
RDW: 14.8
WBC: 10
WBC: 10.8 — ABNORMAL HIGH
WBC: 11.4 — ABNORMAL HIGH
WBC: 11.5 — ABNORMAL HIGH
WBC: 12.1 — ABNORMAL HIGH
WBC: 13.5 — ABNORMAL HIGH

## 2011-01-11 LAB — LIPID PANEL
HDL: 24 — ABNORMAL LOW
LDL Cholesterol: 21
Triglycerides: 98

## 2011-01-11 LAB — BASIC METABOLIC PANEL
BUN: 13
BUN: 13
BUN: 13
BUN: 14
BUN: 15
BUN: 17
BUN: 17
CO2: 23
CO2: 24
CO2: 27
CO2: 28
CO2: 28
CO2: 28
CO2: 30
CO2: 30
Calcium: 7.4 — ABNORMAL LOW
Calcium: 7.5 — ABNORMAL LOW
Calcium: 7.5 — ABNORMAL LOW
Calcium: 7.7 — ABNORMAL LOW
Calcium: 7.8 — ABNORMAL LOW
Chloride: 103
Chloride: 103
Chloride: 103
Chloride: 109
Chloride: 99
Creatinine, Ser: 0.47
Creatinine, Ser: 0.73
Creatinine, Ser: 0.84
GFR calc Af Amer: >60
GFR calc Af Amer: >60
GFR calc non Af Amer: >60
GFR calc non Af Amer: >60
GFR calc non Af Amer: >60
GFR calc non Af Amer: >60
Glucose, Bld: 112 — ABNORMAL HIGH
Glucose, Bld: 112 — ABNORMAL HIGH
Glucose, Bld: 122 — ABNORMAL HIGH
Glucose, Bld: 160 — ABNORMAL HIGH
Glucose, Bld: 219 — ABNORMAL HIGH
Glucose, Bld: 255 — ABNORMAL HIGH
Glucose, Bld: 256 — ABNORMAL HIGH
Potassium: 3.2 — ABNORMAL LOW
Potassium: 3.4 — ABNORMAL LOW
Potassium: 3.8
Potassium: 3.9
Potassium: 4.4
Sodium: 135
Sodium: 135
Sodium: 138
Sodium: 142
Sodium: 142
Sodium: 144

## 2011-01-11 LAB — VANCOMYCIN, TROUGH: Vancomycin Tr: 16.4

## 2011-01-11 LAB — CROSSMATCH

## 2011-01-11 LAB — ANAEROBIC CULTURE

## 2011-01-11 LAB — URINALYSIS, ROUTINE W REFLEX MICROSCOPIC
Glucose, UA: NEGATIVE
Hgb urine dipstick: NEGATIVE
Ketones, ur: 15 — AB
Ketones, ur: >80 — AB
Leukocytes, UA: NEGATIVE
Nitrite: NEGATIVE
Protein, ur: 100 — AB
Protein, ur: >300 — AB
Urobilinogen, UA: 1
pH: 6

## 2011-01-11 LAB — PROTIME-INR
INR: 1.1
INR: 1.2
INR: 2.9 — ABNORMAL HIGH
INR: 8.9
Prothrombin Time: 14.5
Prothrombin Time: 14.5
Prothrombin Time: 14.8
Prothrombin Time: 23.8 — ABNORMAL HIGH
Prothrombin Time: 26.7 — ABNORMAL HIGH
Prothrombin Time: 50.7 — ABNORMAL HIGH
Prothrombin Time: 77.2 — ABNORMAL HIGH

## 2011-01-11 LAB — COMPREHENSIVE METABOLIC PANEL
ALT: 11
ALT: 12
Alkaline Phosphatase: 50
CO2: 21
CO2: 24
Calcium: 7.9 — ABNORMAL LOW
Chloride: 105
Creatinine, Ser: 1.1
GFR calc non Af Amer: >60
GFR calc non Af Amer: >60
Glucose, Bld: 188 — ABNORMAL HIGH
Glucose, Bld: 218 — ABNORMAL HIGH
Potassium: 3.2 — ABNORMAL LOW
Sodium: 135
Total Bilirubin: 1.7 — ABNORMAL HIGH

## 2011-01-11 LAB — POCT I-STAT 3, ART BLOOD GAS (G3+)
Acid-Base Excess: 1
Acid-Base Excess: 4 — ABNORMAL HIGH
Acid-Base Excess: 6 — ABNORMAL HIGH
Bicarbonate: 28.3 — ABNORMAL HIGH
Bicarbonate: 29.6 — ABNORMAL HIGH
O2 Saturation: 98
O2 Saturation: 99
Operator id: 252031
Operator id: 287011
Operator id: 302581
Patient temperature: 37
Patient temperature: 98
Patient temperature: 98.5
TCO2: 23
TCO2: 29
TCO2: 31
pCO2 arterial: 31.3 — ABNORMAL LOW
pCO2 arterial: 34 — ABNORMAL LOW
pCO2 arterial: 39
pCO2 arterial: 41
pH, Arterial: 7.455 — ABNORMAL HIGH
pH, Arterial: 7.466 — ABNORMAL HIGH
pH, Arterial: 7.472 — ABNORMAL HIGH
pO2, Arterial: 218 — ABNORMAL HIGH
pO2, Arterial: 77 — ABNORMAL LOW

## 2011-01-11 LAB — PREPARE FRESH FROZEN PLASMA

## 2011-01-11 LAB — B-NATRIURETIC PEPTIDE (CONVERTED LAB)
Pro B Natriuretic peptide (BNP): 148 — ABNORMAL HIGH
Pro B Natriuretic peptide (BNP): 198 — ABNORMAL HIGH
Pro B Natriuretic peptide (BNP): 203 — ABNORMAL HIGH
Pro B Natriuretic peptide (BNP): 303 — ABNORMAL HIGH

## 2011-01-11 LAB — URINE CULTURE
Colony Count: NO GROWTH
Culture: NO GROWTH

## 2011-01-11 LAB — URINE MICROSCOPIC-ADD ON

## 2011-01-11 LAB — APTT: aPTT: 82 — ABNORMAL HIGH

## 2011-01-11 LAB — POCT I-STAT 4, (NA,K, GLUC, HGB,HCT)
Glucose, Bld: 186 — ABNORMAL HIGH
Glucose, Bld: 66 — ABNORMAL LOW
HCT: 17 — ABNORMAL LOW
HCT: 34 — ABNORMAL LOW
Hemoglobin: 9.5 — ABNORMAL LOW
Operator id: 140821
Potassium: 4
Potassium: 4.7
Potassium: 5.2 — ABNORMAL HIGH
Sodium: 140

## 2011-01-11 LAB — CULTURE, BLOOD (ROUTINE X 2)
Culture: NO GROWTH
Culture: NO GROWTH

## 2011-01-11 LAB — CULTURE, BAL-QUANTITATIVE W GRAM STAIN: Colony Count: UNDETERMINED

## 2011-01-11 LAB — GRAM STAIN

## 2011-01-11 LAB — HEMOGLOBIN A1C: Hgb A1c MFr Bld: 5.9

## 2011-01-11 LAB — MAGNESIUM
Magnesium: 1.7
Magnesium: 1.8

## 2011-01-11 LAB — CARDIAC PANEL(CRET KIN+CKTOT+MB+TROPI)
Relative Index: INVALID
Total CK: 46
Total CK: 49

## 2011-01-11 LAB — PHOSPHORUS: Phosphorus: 1.8 — ABNORMAL LOW

## 2011-01-11 LAB — TSH: TSH: 1.98

## 2011-01-11 LAB — CARBOXYHEMOGLOBIN: O2 Saturation: 95.3

## 2011-01-12 LAB — MAGNESIUM
Magnesium: 2
Magnesium: 2
Magnesium: 2
Magnesium: 2.1
Magnesium: 2.1
Magnesium: 2.1
Magnesium: 2.2
Magnesium: 2
Magnesium: 2

## 2011-01-12 LAB — BASIC METABOLIC PANEL
BUN: 12
BUN: 15
BUN: 15
BUN: 16
BUN: 17
BUN: 17
BUN: 18
BUN: 19
BUN: 15
BUN: 16
BUN: 18
CO2: 30
CO2: 31
CO2: 32
CO2: 30
CO2: 31
CO2: 31
CO2: 32
Calcium: 7.8 — ABNORMAL LOW
Calcium: 8.3 — ABNORMAL LOW
Calcium: 8.1 — ABNORMAL LOW
Calcium: 8.2 — ABNORMAL LOW
Calcium: 8.3 — ABNORMAL LOW
Calcium: 8.8
Calcium: 8.8
Calcium: 8.8
Chloride: 101
Chloride: 101
Chloride: 97
Chloride: 98
Chloride: 99
Chloride: 99
Chloride: 98
Chloride: 99
Creatinine, Ser: 0.47
Creatinine, Ser: 0.49
Creatinine, Ser: 0.54
Creatinine, Ser: 0.54
Creatinine, Ser: 0.58
Creatinine, Ser: 0.71
Creatinine, Ser: 0.52
Creatinine, Ser: 0.53
Creatinine, Ser: 0.55
GFR calc Af Amer: >60
GFR calc Af Amer: >60
GFR calc Af Amer: >60
GFR calc Af Amer: >60
GFR calc Af Amer: >60
GFR calc Af Amer: >60
GFR calc Af Amer: >60
GFR calc non Af Amer: >60
GFR calc non Af Amer: >60
GFR calc non Af Amer: >60
GFR calc non Af Amer: >60
GFR calc non Af Amer: >60
GFR calc non Af Amer: >60
GFR calc non Af Amer: >60
GFR calc non Af Amer: >60
GFR calc non Af Amer: >60
GFR calc non Af Amer: >60
GFR calc non Af Amer: >60
GFR calc non Af Amer: >60
GFR calc non Af Amer: >60
GFR calc non Af Amer: >60
GFR calc non Af Amer: >60
GFR calc non Af Amer: >60
Glucose, Bld: 103 — ABNORMAL HIGH
Glucose, Bld: 104 — ABNORMAL HIGH
Glucose, Bld: 145 — ABNORMAL HIGH
Glucose, Bld: 151 — ABNORMAL HIGH
Glucose, Bld: 157 — ABNORMAL HIGH
Glucose, Bld: 163 — ABNORMAL HIGH
Glucose, Bld: 77
Glucose, Bld: 156 — ABNORMAL HIGH
Glucose, Bld: 183 — ABNORMAL HIGH
Glucose, Bld: 255 — ABNORMAL HIGH
Glucose, Bld: 92
Glucose, Bld: 94
Potassium: 3.5
Potassium: 3.8
Potassium: 4
Potassium: 4.3
Potassium: 4.3
Potassium: 4.3
Potassium: 4.3
Potassium: 4.4
Potassium: 4.4
Potassium: 4.5
Potassium: 4
Potassium: 4.1
Potassium: 4.5
Sodium: 134 — ABNORMAL LOW
Sodium: 135
Sodium: 136
Sodium: 136
Sodium: 137
Sodium: 137
Sodium: 135
Sodium: 137
Sodium: 138

## 2011-01-12 LAB — CBC
HCT: 27.1 — ABNORMAL LOW
HCT: 28.3 — ABNORMAL LOW
HCT: 28.3 — ABNORMAL LOW
HCT: 28.5 — ABNORMAL LOW
HCT: 28.8 — ABNORMAL LOW
HCT: 29 — ABNORMAL LOW
HCT: 30.3 — ABNORMAL LOW
HCT: 31.4 — ABNORMAL LOW
HCT: 35.2 — ABNORMAL LOW
HCT: 27.8 — ABNORMAL LOW
HCT: 28.2 — ABNORMAL LOW
HCT: 28.6 — ABNORMAL LOW
Hemoglobin: 10.1 — ABNORMAL LOW
Hemoglobin: 9.5 — ABNORMAL LOW
Hemoglobin: 9.5 — ABNORMAL LOW
Hemoglobin: 9.6 — ABNORMAL LOW
Hemoglobin: 9.6 — ABNORMAL LOW
Hemoglobin: 9.7 — ABNORMAL LOW
Hemoglobin: 10.5 — ABNORMAL LOW
Hemoglobin: 11 — ABNORMAL LOW
Hemoglobin: 9.4 — ABNORMAL LOW
Hemoglobin: 9.7 — ABNORMAL LOW
MCHC: 33.3
MCHC: 33.6
MCHC: 33.6
MCHC: 33.3
MCHC: 33.3
MCHC: 33.7
MCHC: 33.9
MCHC: 34
MCV: 90.2
MCV: 90.8
MCV: 91.2
MCV: 91.3
MCV: 90.4
MCV: 90.6
MCV: 91.8
Platelets: 390
Platelets: 403 — ABNORMAL HIGH
Platelets: 437 — ABNORMAL HIGH
Platelets: 460 — ABNORMAL HIGH
Platelets: 509 — ABNORMAL HIGH
Platelets: 529 — ABNORMAL HIGH
Platelets: 579 — ABNORMAL HIGH
Platelets: 198
Platelets: 272
Platelets: 312
RBC: 3.1 — ABNORMAL LOW
RBC: 3.12 — ABNORMAL LOW
RBC: 3.27 — ABNORMAL LOW
RBC: 3.12 — ABNORMAL LOW
RBC: 3.45 — ABNORMAL LOW
RBC: 3.63 — ABNORMAL LOW
RBC: 3.86 — ABNORMAL LOW
RDW: 13.5
RDW: 13.7
RDW: 13.8
RDW: 13.8
RDW: 14
RDW: 14
RDW: 14.3
RDW: 14.3
RDW: 14.3
RDW: 14.4
RDW: 14.7
RDW: 14.9
WBC: 10.2
WBC: 10.2
WBC: 10.3
WBC: 10.7 — ABNORMAL HIGH
WBC: 11.1 — ABNORMAL HIGH
WBC: 11.4 — ABNORMAL HIGH
WBC: 11.6 — ABNORMAL HIGH
WBC: 12.5 — ABNORMAL HIGH
WBC: 8.7
WBC: 9
WBC: 10.3
WBC: 12.3 — ABNORMAL HIGH

## 2011-01-12 LAB — PROTIME-INR
INR: 1
INR: 1
INR: 1
INR: 1
INR: 1.1
INR: 1.1
INR: 1.1
INR: 1.1
INR: 1.1
INR: 1.1
INR: 1.1
Prothrombin Time: 13.9
Prothrombin Time: 14.9
Prothrombin Time: 15
Prothrombin Time: 13.4
Prothrombin Time: 13.7
Prothrombin Time: 13.7
Prothrombin Time: 14.1

## 2011-01-12 LAB — B-NATRIURETIC PEPTIDE (CONVERTED LAB)
Pro B Natriuretic peptide (BNP): 116 — ABNORMAL HIGH
Pro B Natriuretic peptide (BNP): 138 — ABNORMAL HIGH
Pro B Natriuretic peptide (BNP): 180 — ABNORMAL HIGH
Pro B Natriuretic peptide (BNP): 43
Pro B Natriuretic peptide (BNP): 43
Pro B Natriuretic peptide (BNP): 50
Pro B Natriuretic peptide (BNP): 60
Pro B Natriuretic peptide (BNP): 64
Pro B Natriuretic peptide (BNP): 66
Pro B Natriuretic peptide (BNP): 79
Pro B Natriuretic peptide (BNP): 94
Pro B Natriuretic peptide (BNP): 61
Pro B Natriuretic peptide (BNP): 78

## 2011-01-12 LAB — CULTURE, BLOOD (ROUTINE X 2)
Culture: NO GROWTH
Culture: NO GROWTH

## 2011-01-12 LAB — URINE MICROSCOPIC-ADD ON

## 2011-01-12 LAB — URINALYSIS, ROUTINE W REFLEX MICROSCOPIC
Bilirubin Urine: NEGATIVE
Glucose, UA: NEGATIVE
Ketones, ur: NEGATIVE
Protein, ur: NEGATIVE
pH: 7

## 2011-01-12 LAB — POCT I-STAT 3, ART BLOOD GAS (G3+)
Acid-Base Excess: 7 — ABNORMAL HIGH
O2 Saturation: 96
TCO2: 32
pCO2 arterial: 39
pO2, Arterial: 77 — ABNORMAL LOW

## 2011-01-12 LAB — CLOSTRIDIUM DIFFICILE EIA
C difficile Toxins A+B, EIA: NEGATIVE
C difficile Toxins A+B, EIA: NEGATIVE

## 2011-01-12 LAB — URINE CULTURE

## 2011-01-12 LAB — PHOSPHORUS: Phosphorus: 3.6

## 2011-01-12 LAB — APTT: aPTT: 28

## 2011-01-22 LAB — COMPREHENSIVE METABOLIC PANEL
ALT: 19
AST: 25
Albumin: 2.7 — ABNORMAL LOW
Alkaline Phosphatase: 123 — ABNORMAL HIGH
BUN: 20
CO2: 31
CO2: 33 — ABNORMAL HIGH
Calcium: 8.9
Chloride: 104
Creatinine, Ser: 0.69
Creatinine, Ser: 0.69
GFR calc Af Amer: >60
GFR calc non Af Amer: >60
GFR calc non Af Amer: >60
Glucose, Bld: 194 — ABNORMAL HIGH
Potassium: 4.5
Sodium: 144
Total Bilirubin: 1.8 — ABNORMAL HIGH
Total Protein: 5.8 — ABNORMAL LOW

## 2011-01-22 LAB — DIFFERENTIAL
Basophils Absolute: 0
Basophils Relative: 0
Lymphocytes Relative: 11 — ABNORMAL LOW
Lymphocytes Relative: 13
Lymphs Abs: 1.5
Monocytes Absolute: 1
Monocytes Relative: 8
Neutro Abs: 11.2 — ABNORMAL HIGH
Neutro Abs: 8.7 — ABNORMAL HIGH
Neutrophils Relative %: 77
Neutrophils Relative %: 81 — ABNORMAL HIGH

## 2011-01-22 LAB — POCT CARDIAC MARKERS
CKMB, poc: 2.7
Myoglobin, poc: 146
Troponin i, poc: <0.05

## 2011-01-22 LAB — TROPONIN I: Troponin I: 0.03

## 2011-01-22 LAB — MAGNESIUM: Magnesium: 2

## 2011-01-22 LAB — CBC
HCT: 38.3 — ABNORMAL LOW
Hemoglobin: 13.4
MCHC: 34.9
Platelets: 213
Platelets: 244
RBC: 4.09 — ABNORMAL LOW
RDW: 13.5
WBC: 13.8 — ABNORMAL HIGH

## 2011-01-22 LAB — POCT I-STAT, CHEM 8
BUN: 23
Calcium, Ion: 1.24
Chloride: 98
HCT: 47
Potassium: 4.3
Sodium: 141

## 2011-01-22 LAB — LIPASE, BLOOD: Lipase: 17

## 2011-01-22 LAB — URINE MICROSCOPIC-ADD ON

## 2011-01-22 LAB — CULTURE, BLOOD (ROUTINE X 2)

## 2011-01-22 LAB — URINALYSIS, ROUTINE W REFLEX MICROSCOPIC
Hgb urine dipstick: NEGATIVE
Nitrite: NEGATIVE
Specific Gravity, Urine: 1.034 — ABNORMAL HIGH
Urobilinogen, UA: 1
pH: 5.5

## 2011-01-22 LAB — LACTIC ACID, PLASMA: Lactic Acid, Venous: 1.6

## 2011-01-22 LAB — GLUCOSE, CAPILLARY
Glucose-Capillary: 152 — ABNORMAL HIGH
Glucose-Capillary: 198 — ABNORMAL HIGH

## 2011-01-22 LAB — PROTIME-INR
INR: 1.1
Prothrombin Time: 14.2

## 2011-01-23 LAB — GLUCOSE, CAPILLARY
Glucose-Capillary: 102 — ABNORMAL HIGH
Glucose-Capillary: 102 — ABNORMAL HIGH
Glucose-Capillary: 105 — ABNORMAL HIGH
Glucose-Capillary: 106 — ABNORMAL HIGH
Glucose-Capillary: 109 — ABNORMAL HIGH
Glucose-Capillary: 112 — ABNORMAL HIGH
Glucose-Capillary: 114 — ABNORMAL HIGH
Glucose-Capillary: 115 — ABNORMAL HIGH
Glucose-Capillary: 117 — ABNORMAL HIGH
Glucose-Capillary: 118 — ABNORMAL HIGH
Glucose-Capillary: 121 — ABNORMAL HIGH
Glucose-Capillary: 122 — ABNORMAL HIGH
Glucose-Capillary: 123 — ABNORMAL HIGH
Glucose-Capillary: 124 — ABNORMAL HIGH
Glucose-Capillary: 125 — ABNORMAL HIGH
Glucose-Capillary: 129 — ABNORMAL HIGH
Glucose-Capillary: 132 — ABNORMAL HIGH
Glucose-Capillary: 133 — ABNORMAL HIGH
Glucose-Capillary: 133 — ABNORMAL HIGH
Glucose-Capillary: 134 — ABNORMAL HIGH
Glucose-Capillary: 135 — ABNORMAL HIGH
Glucose-Capillary: 136 — ABNORMAL HIGH
Glucose-Capillary: 137 — ABNORMAL HIGH
Glucose-Capillary: 138 — ABNORMAL HIGH
Glucose-Capillary: 140 — ABNORMAL HIGH
Glucose-Capillary: 142 — ABNORMAL HIGH
Glucose-Capillary: 142 — ABNORMAL HIGH
Glucose-Capillary: 147 — ABNORMAL HIGH
Glucose-Capillary: 151 — ABNORMAL HIGH
Glucose-Capillary: 151 — ABNORMAL HIGH
Glucose-Capillary: 161 — ABNORMAL HIGH
Glucose-Capillary: 161 — ABNORMAL HIGH
Glucose-Capillary: 162 — ABNORMAL HIGH
Glucose-Capillary: 168 — ABNORMAL HIGH
Glucose-Capillary: 170 — ABNORMAL HIGH
Glucose-Capillary: 174 — ABNORMAL HIGH
Glucose-Capillary: 178 — ABNORMAL HIGH
Glucose-Capillary: 65 — ABNORMAL LOW
Glucose-Capillary: 69 — ABNORMAL LOW
Glucose-Capillary: 72
Glucose-Capillary: 74
Glucose-Capillary: 75
Glucose-Capillary: 84
Glucose-Capillary: 84
Glucose-Capillary: 87
Glucose-Capillary: 87
Glucose-Capillary: 88
Glucose-Capillary: 91
Glucose-Capillary: 94
Glucose-Capillary: 95
Glucose-Capillary: 96
Glucose-Capillary: 98

## 2011-01-23 LAB — BASIC METABOLIC PANEL
BUN: 7
CO2: 23
Calcium: 8 — ABNORMAL LOW
Chloride: 103
Chloride: 106
Chloride: 107
Creatinine, Ser: 0.46
GFR calc Af Amer: >60
GFR calc Af Amer: >60
GFR calc non Af Amer: >60
GFR calc non Af Amer: >60
Glucose, Bld: 150 — ABNORMAL HIGH
Potassium: 3.6
Potassium: 3.6
Potassium: 4.1
Sodium: 136
Sodium: 139
Sodium: 139
Sodium: 141

## 2011-01-23 LAB — COMPREHENSIVE METABOLIC PANEL
ALT: 10
ALT: 12
ALT: 13
ALT: 25
ALT: 28
AST: 18
AST: 19
AST: 21
AST: 31
Albumin: 1.8 — ABNORMAL LOW
Albumin: 2.2 — ABNORMAL LOW
Albumin: 2.4 — ABNORMAL LOW
Alkaline Phosphatase: 84
Alkaline Phosphatase: 89
Alkaline Phosphatase: 93
Alkaline Phosphatase: 95
BUN: 17
BUN: 6
CO2: 27
CO2: 27
CO2: 28
Calcium: 7.5 — ABNORMAL LOW
Calcium: 7.8 — ABNORMAL LOW
Calcium: 8 — ABNORMAL LOW
Calcium: 8.2 — ABNORMAL LOW
Calcium: 8.2 — ABNORMAL LOW
Calcium: 8.6
Chloride: 102
Chloride: 107
Chloride: 112
Creatinine, Ser: 0.56
Creatinine, Ser: 0.62
GFR calc Af Amer: >60
GFR calc Af Amer: >60
GFR calc Af Amer: >60
GFR calc Af Amer: >60
GFR calc non Af Amer: >60
GFR calc non Af Amer: >60
Glucose, Bld: 109 — ABNORMAL HIGH
Glucose, Bld: 118 — ABNORMAL HIGH
Glucose, Bld: 171 — ABNORMAL HIGH
Glucose, Bld: 82
Potassium: 3.9
Potassium: 4
Potassium: 4.2
Sodium: 134 — ABNORMAL LOW
Sodium: 136
Sodium: 138
Sodium: 139
Sodium: 145
Total Bilirubin: 0.5
Total Bilirubin: 0.8
Total Protein: 4.4 — ABNORMAL LOW
Total Protein: 4.8 — ABNORMAL LOW
Total Protein: 5.2 — ABNORMAL LOW
Total Protein: 5.7 — ABNORMAL LOW

## 2011-01-23 LAB — URINALYSIS, ROUTINE W REFLEX MICROSCOPIC
Ketones, ur: NEGATIVE
Nitrite: NEGATIVE
Urobilinogen, UA: 1
pH: 5.5

## 2011-01-23 LAB — CBC
HCT: 27.3 — ABNORMAL LOW
HCT: 30.8 — ABNORMAL LOW
HCT: 31.1 — ABNORMAL LOW
HCT: 31.6 — ABNORMAL LOW
HCT: 33.5 — ABNORMAL LOW
HCT: 33.8 — ABNORMAL LOW
HCT: 34.3 — ABNORMAL LOW
HCT: 35.3 — ABNORMAL LOW
HCT: 35.4 — ABNORMAL LOW
Hemoglobin: 10.3 — ABNORMAL LOW
Hemoglobin: 10.6 — ABNORMAL LOW
Hemoglobin: 11.3 — ABNORMAL LOW
Hemoglobin: 11.4 — ABNORMAL LOW
Hemoglobin: 11.4 — ABNORMAL LOW
Hemoglobin: 11.7 — ABNORMAL LOW
Hemoglobin: 11.9 — ABNORMAL LOW
Hemoglobin: 12 — ABNORMAL LOW
Hemoglobin: 9.5 — ABNORMAL LOW
Hemoglobin: 9.9 — ABNORMAL LOW
Hemoglobin: 9.9 — ABNORMAL LOW
MCHC: 33
MCHC: 33.1
MCHC: 33.4
MCHC: 33.6
MCHC: 33.8
MCHC: 33.9
MCHC: 34.1
MCV: 93.4
MCV: 93.7
MCV: 94.9
MCV: 95.2
MCV: 95.3
MCV: 95.4
Platelets: 222
Platelets: 240
Platelets: 252
Platelets: 259
Platelets: 368
RBC: 3.11 — ABNORMAL LOW
RBC: 3.18 — ABNORMAL LOW
RBC: 3.28 — ABNORMAL LOW
RBC: 3.59 — ABNORMAL LOW
RBC: 3.61 — ABNORMAL LOW
RBC: 3.74 — ABNORMAL LOW
RBC: 3.79 — ABNORMAL LOW
RDW: 12.9
RDW: 13.1
RDW: 13.3
RDW: 13.5
RDW: 13.6
RDW: 14
RDW: 14.1
RDW: 14.6
WBC: 4.4
WBC: 5.4
WBC: 5.5
WBC: 6.7
WBC: 7.5
WBC: 7.6
WBC: 7.8
WBC: 9.3
WBC: 9.8

## 2011-01-23 LAB — URINE CULTURE: Colony Count: 15000

## 2011-01-23 LAB — RENAL FUNCTION PANEL
BUN: 12
CO2: 29
Calcium: 8.6
Creatinine, Ser: 0.693
Glucose, Bld: 185 — ABNORMAL HIGH
Phosphorus: 1.5 — ABNORMAL LOW
Sodium: 144

## 2011-01-23 LAB — DIFFERENTIAL
Basophils Absolute: 0
Basophils Absolute: 0
Basophils Relative: 0
Basophils Relative: 0
Eosinophils Absolute: 0.2
Eosinophils Relative: 2
Eosinophils Relative: 3
Lymphocytes Relative: 22
Lymphocytes Relative: 28
Lymphs Abs: 2
Lymphs Abs: 2.1
Lymphs Abs: 2.4
Monocytes Absolute: 0.9
Monocytes Absolute: 0.9
Monocytes Relative: 13 — ABNORMAL HIGH
Neutro Abs: 4.2
Neutro Abs: 4.2
Neutro Abs: 5.1
Neutrophils Relative %: 59

## 2011-01-23 LAB — PHOSPHORUS
Phosphorus: 3.2
Phosphorus: 3.9

## 2011-01-23 LAB — MAGNESIUM
Magnesium: 1.6
Magnesium: 2.1

## 2011-01-23 LAB — TRIGLYCERIDES: Triglycerides: 47

## 2011-01-23 LAB — CHOLESTEROL, TOTAL: Cholesterol: 58

## 2011-01-23 LAB — CULTURE, BLOOD (ROUTINE X 2)
Culture: NO GROWTH
Culture: NO GROWTH

## 2011-01-23 LAB — URINE MICROSCOPIC-ADD ON

## 2011-02-08 LAB — CBC
HCT: 28.9 — ABNORMAL LOW
Hemoglobin: 9.9 — ABNORMAL LOW
MCHC: 34.1
MCV: 87.9
Platelets: 529 — ABNORMAL HIGH
RBC: 3.28 — ABNORMAL LOW
RDW: 15.3 — ABNORMAL HIGH
WBC: 8.3

## 2011-02-08 LAB — PROTIME-INR: Prothrombin Time: 14.5

## 2011-04-10 IMAGING — CR DG CHEST 1V
1 series · 1 of 1 positions shown · non-contrast
Comparison: 02/03/2009

CLINICAL DATA: Cough and shortness of breath.

CHEST - 1 VIEW

[view not recorded]
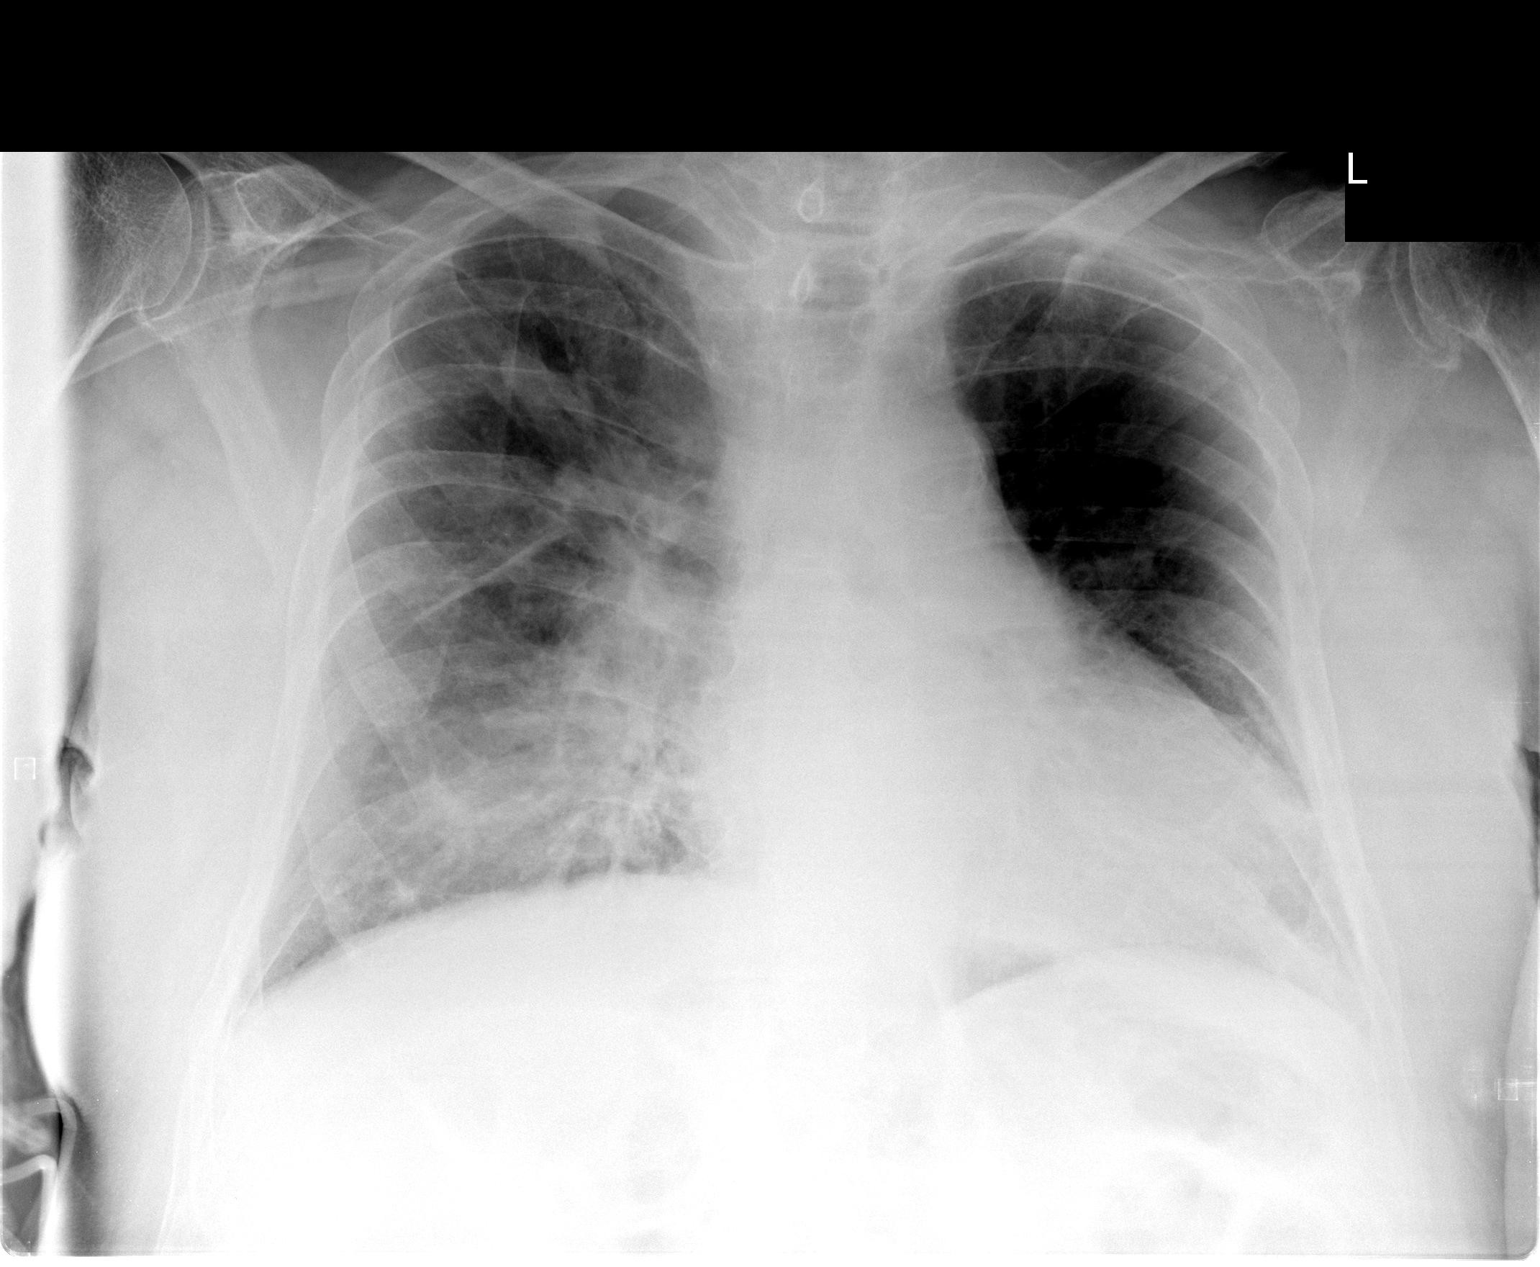

[1 of 1 positions shown; findings below may reference images not displayed]

FINDINGS: There is extensive pneumonia in the right lung
predominately in the mid to lower lung zones.  No associated edema
or pleural fluid identified.  Heart size is stable.
IMPRESSION: Extensive right lung pneumonia.

## 2011-05-25 IMAGING — CR DG CHEST 2V
2 series · 2 of 2 positions shown · non-contrast
Comparison: 05/27/2009

CLINICAL DATA: Cough.

CHEST - 2 VIEW

[view not recorded (1 of 2)]
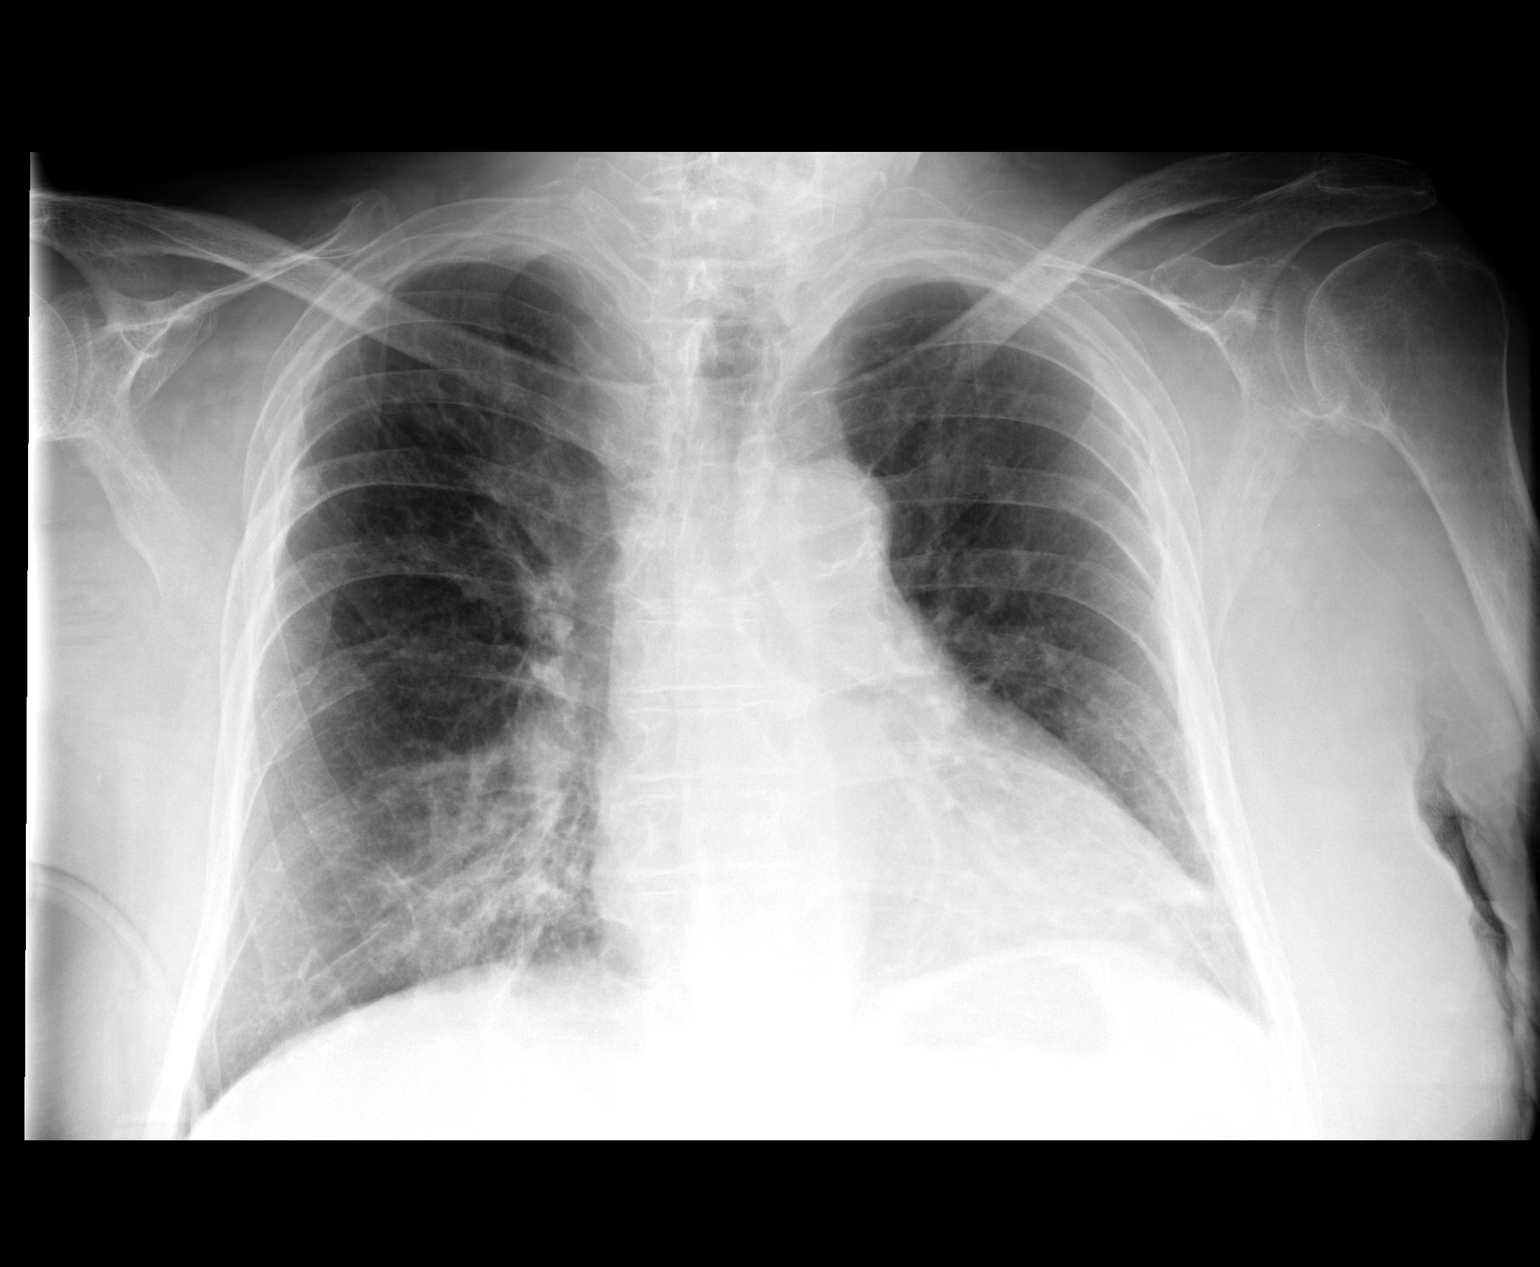

[view not recorded (2 of 2)]
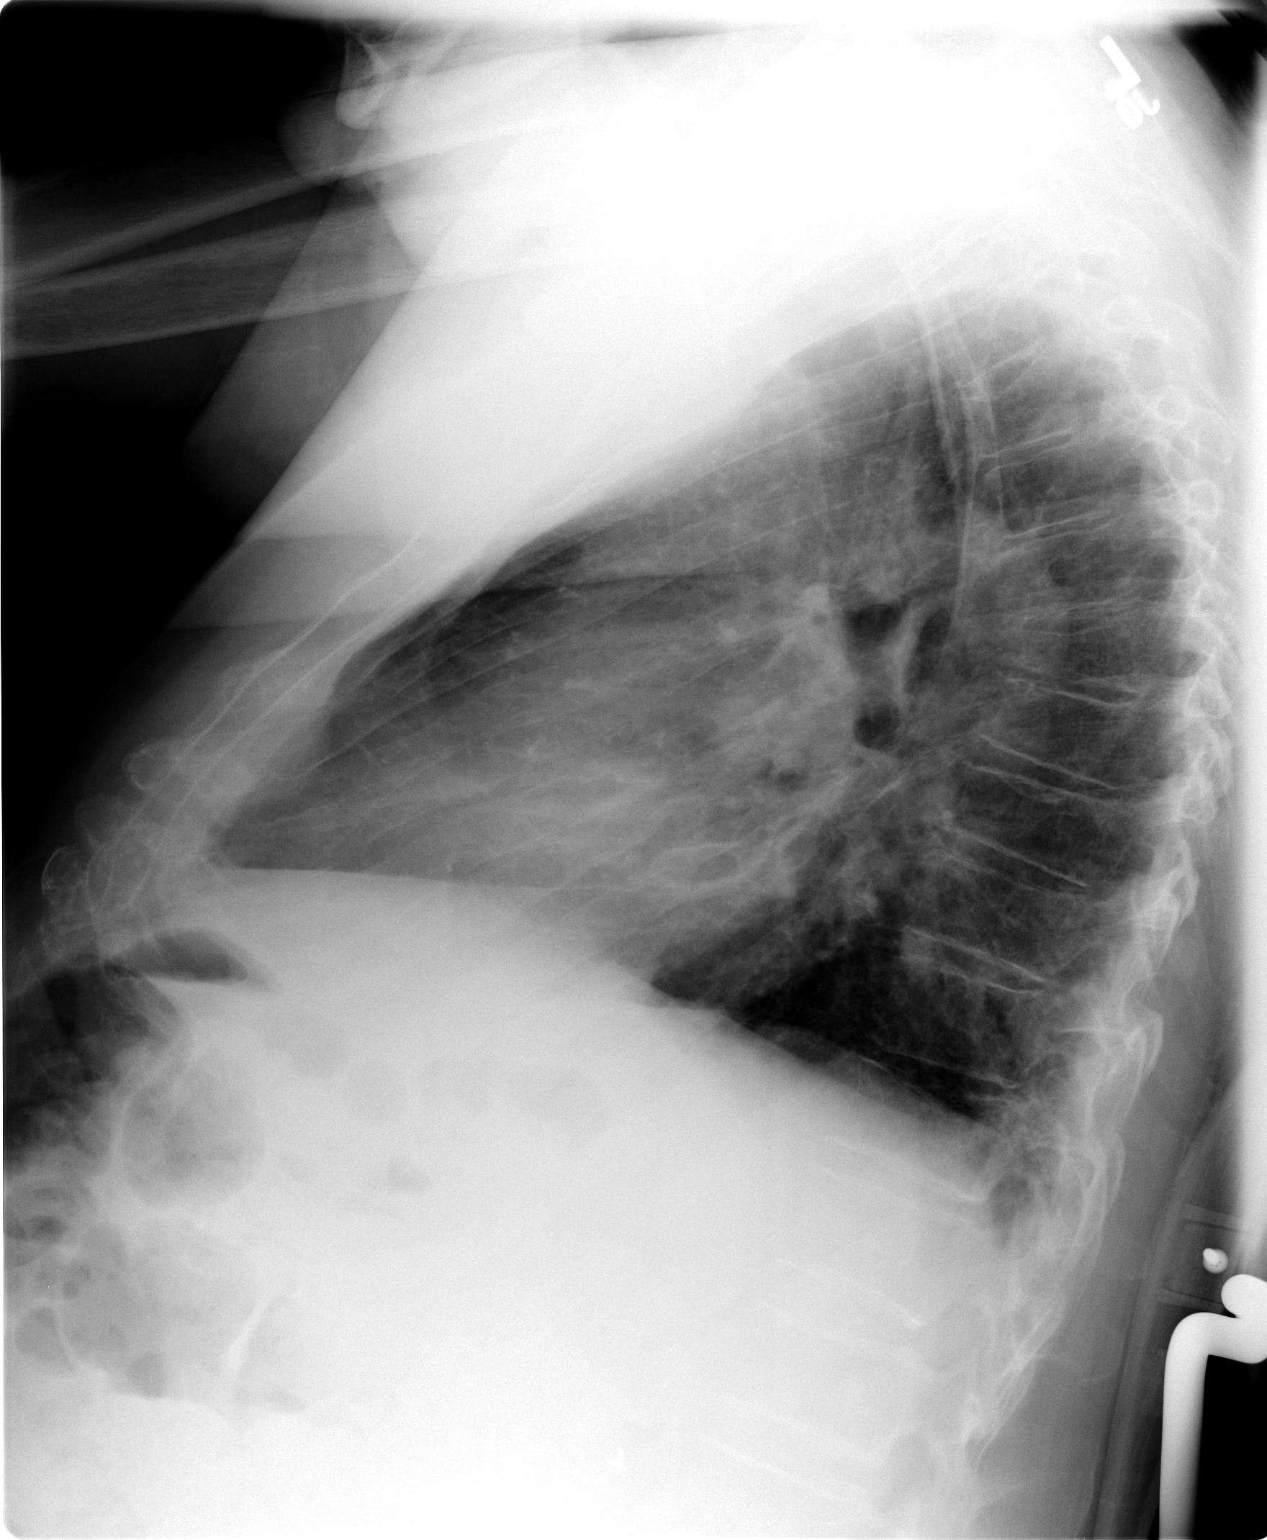

[2 of 2 positions shown; findings below may reference images not displayed]

FINDINGS: Overall, the previously seen right lung opacities have
improved.  There is residual or recurrent airspace disease in the
right lower lobe.  Left lung is clear.  Heart is borderline in
size.  No effusions.
IMPRESSION: Right infrahilar lower lobe consolidation.  This could represent
residual pneumonia or recurrent pneumonia.

## 2011-07-14 ENCOUNTER — Inpatient Hospital Stay (HOSPITAL_COMMUNITY): Payer: Medicare Other

## 2011-07-14 ENCOUNTER — Encounter (HOSPITAL_COMMUNITY): Payer: Self-pay | Admitting: *Deleted

## 2011-07-14 ENCOUNTER — Inpatient Hospital Stay (HOSPITAL_COMMUNITY)
Admission: EM | Admit: 2011-07-14 | Discharge: 2011-07-17 | DRG: 065 | Disposition: A | Discharge status: Home / Self Care | Payer: Medicare Other | Attending: Family Medicine | Admitting: Family Medicine

## 2011-07-14 ENCOUNTER — Other Ambulatory Visit: Payer: Self-pay

## 2011-07-14 ENCOUNTER — Emergency Department (HOSPITAL_COMMUNITY): Payer: Medicare Other

## 2011-07-14 DIAGNOSIS — I69959 Hemiplegia and hemiparesis following unspecified cerebrovascular disease affecting unspecified side: Secondary | ICD-10-CM

## 2011-07-14 DIAGNOSIS — Z970 Presence of artificial eye: Secondary | ICD-10-CM

## 2011-07-14 DIAGNOSIS — R4789 Other speech disturbances: Secondary | ICD-10-CM | POA: Diagnosis present

## 2011-07-14 DIAGNOSIS — E119 Type 2 diabetes mellitus without complications: Secondary | ICD-10-CM | POA: Diagnosis present

## 2011-07-14 DIAGNOSIS — IMO0002 Reserved for concepts with insufficient information to code with codable children: Secondary | ICD-10-CM | POA: Diagnosis present

## 2011-07-14 DIAGNOSIS — F32A Depression, unspecified: Secondary | ICD-10-CM | POA: Diagnosis present

## 2011-07-14 DIAGNOSIS — E1151 Type 2 diabetes mellitus with diabetic peripheral angiopathy without gangrene: Secondary | ICD-10-CM | POA: Diagnosis present

## 2011-07-14 DIAGNOSIS — K5900 Constipation, unspecified: Secondary | ICD-10-CM

## 2011-07-14 DIAGNOSIS — Z7902 Long term (current) use of antithrombotics/antiplatelets: Secondary | ICD-10-CM

## 2011-07-14 DIAGNOSIS — F329 Major depressive disorder, single episode, unspecified: Secondary | ICD-10-CM

## 2011-07-14 DIAGNOSIS — Z880 Allergy status to penicillin: Secondary | ICD-10-CM

## 2011-07-14 DIAGNOSIS — R29810 Facial weakness: Secondary | ICD-10-CM

## 2011-07-14 DIAGNOSIS — E039 Hypothyroidism, unspecified: Secondary | ICD-10-CM | POA: Diagnosis present

## 2011-07-14 DIAGNOSIS — Z87891 Personal history of nicotine dependence: Secondary | ICD-10-CM

## 2011-07-14 DIAGNOSIS — F3289 Other specified depressive episodes: Secondary | ICD-10-CM | POA: Diagnosis present

## 2011-07-14 DIAGNOSIS — I6350 Cerebral infarction due to unspecified occlusion or stenosis of unspecified cerebral artery: Principal | ICD-10-CM | POA: Diagnosis present

## 2011-07-14 DIAGNOSIS — Z7982 Long term (current) use of aspirin: Secondary | ICD-10-CM

## 2011-07-14 DIAGNOSIS — R4781 Slurred speech: Secondary | ICD-10-CM | POA: Diagnosis present

## 2011-07-14 DIAGNOSIS — I639 Cerebral infarction, unspecified: Secondary | ICD-10-CM

## 2011-07-14 DIAGNOSIS — Z79899 Other long term (current) drug therapy: Secondary | ICD-10-CM

## 2011-07-14 HISTORY — DX: Blindness, one eye, unspecified eye: H54.40

## 2011-07-14 HISTORY — DX: Cerebral infarction, unspecified: I63.9

## 2011-07-14 LAB — PROTIME-INR
INR: 1.02 (ref 0.00–1.49)
Prothrombin Time: 13.6 seconds (ref 11.6–15.2)

## 2011-07-14 LAB — TROPONIN I: Troponin I: <0.3 ng/mL (ref <0.30)

## 2011-07-14 LAB — DIFFERENTIAL
Eosinophils Absolute: 0.1 10*3/uL (ref 0.0–0.7)
Lymphocytes Relative: 34 % (ref 12–46)
Lymphs Abs: 3.3 10*3/uL (ref 0.7–4.0)
Monocytes Relative: 7 % (ref 3–12)
Neutro Abs: 5.4 10*3/uL (ref 1.7–7.7)
Neutrophils Relative %: 57 % (ref 43–77)

## 2011-07-14 LAB — COMPREHENSIVE METABOLIC PANEL
ALT: 16 U/L (ref 0–53)
Alkaline Phosphatase: 79 U/L (ref 39–117)
CO2: 29 mEq/L (ref 19–32)
Chloride: 105 mEq/L (ref 96–112)
GFR calc Af Amer: >90 mL/min (ref >90)
Glucose, Bld: 117 mg/dL — ABNORMAL HIGH (ref 70–99)
Potassium: 3.6 mEq/L (ref 3.5–5.1)
Sodium: 142 mEq/L (ref 135–145)
Total Bilirubin: 0.3 mg/dL (ref 0.3–1.2)
Total Protein: 6.6 g/dL (ref 6.0–8.3)

## 2011-07-14 LAB — APTT: aPTT: 37 seconds (ref 24–37)

## 2011-07-14 LAB — CBC
Hemoglobin: 13.1 g/dL (ref 13.0–17.0)
Platelets: 214 10*3/uL (ref 150–400)
RBC: 4.39 MIL/uL (ref 4.22–5.81)
WBC: 9.6 10*3/uL (ref 4.0–10.5)

## 2011-07-14 MED ORDER — SODIUM CHLORIDE 0.9 % IV SOLN: 250.0000 mL | INTRAVENOUS | Status: DC | PRN

## 2011-07-14 MED ORDER — SODIUM CHLORIDE 0.9 % IJ SOLN
3.0000 mL | Freq: Two times a day (BID) | INTRAMUSCULAR | Status: DC
  Administered 2011-07-15 – 2011-07-17 (×4): 3 mL via INTRAVENOUS

## 2011-07-14 MED ORDER — HEPARIN SODIUM (PORCINE) 5000 UNIT/ML IJ SOLN
5000.0000 [IU] | Freq: Three times a day (TID) | INTRAMUSCULAR | Status: DC
  Administered 2011-07-14 – 2011-07-17 (×7): 5000 [IU] via SUBCUTANEOUS
  Filled 2011-07-14 (×11): qty 1

## 2011-07-14 MED ORDER — SODIUM CHLORIDE 0.9 % IV SOLN
INTRAVENOUS | Status: DC
  Administered 2011-07-16 (×2): via INTRAVENOUS

## 2011-07-14 MED ORDER — DULOXETINE HCL 30 MG PO CPEP
30.0000 mg | ORAL_CAPSULE | ORAL | Status: DC
Start: 2011-07-16 — End: 2011-07-17
  Administered 2011-07-16: 30 mg via ORAL
  Filled 2011-07-14: qty 1

## 2011-07-14 MED ORDER — AMITRIPTYLINE HCL 25 MG PO TABS
25.0000 mg | ORAL_TABLET | Freq: Every day | ORAL | Status: DC
  Administered 2011-07-15 – 2011-07-16 (×2): 25 mg via ORAL
  Filled 2011-07-14 (×4): qty 1

## 2011-07-14 MED ORDER — CLOPIDOGREL BISULFATE 75 MG PO TABS: 75.0000 mg | ORAL_TABLET | Freq: Every day | ORAL | Status: DC

## 2011-07-14 MED ORDER — ATORVASTATIN CALCIUM 40 MG PO TABS
40.0000 mg | ORAL_TABLET | Freq: Every day | ORAL | Status: DC
  Filled 2011-07-14: qty 1

## 2011-07-14 MED ORDER — MORPHINE SULFATE 2 MG/ML IJ SOLN
1.0000 mg | INTRAMUSCULAR | Status: DC | PRN
  Administered 2011-07-15 – 2011-07-16 (×6): 1 mg via INTRAVENOUS
  Filled 2011-07-14 (×6): qty 1

## 2011-07-14 MED ORDER — TRAMADOL-ACETAMINOPHEN 37.5-325 MG PO TABS
1.0000 | ORAL_TABLET | Freq: Two times a day (BID) | ORAL | Status: DC
  Administered 2011-07-15 – 2011-07-17 (×4): 1 via ORAL
  Filled 2011-07-14 (×7): qty 1

## 2011-07-14 MED ORDER — LEVOTHYROXINE SODIUM 150 MCG PO TABS
150.0000 ug | ORAL_TABLET | Freq: Every day | ORAL | Status: DC
  Administered 2011-07-16 – 2011-07-17 (×2): 150 ug via ORAL
  Filled 2011-07-14 (×4): qty 1

## 2011-07-14 MED ORDER — ASPIRIN EC 81 MG PO TBEC
81.0000 mg | DELAYED_RELEASE_TABLET | Freq: Every day | ORAL | Status: DC
  Administered 2011-07-16 – 2011-07-17 (×2): 81 mg via ORAL
  Filled 2011-07-14 (×3): qty 1

## 2011-07-14 MED ORDER — SENNOSIDES-DOCUSATE SODIUM 8.6-50 MG PO TABS: 1.0000 | ORAL_TABLET | Freq: Every evening | ORAL | Status: DC | PRN

## 2011-07-14 MED ORDER — MORPHINE SULFATE 2 MG/ML IJ SOLN: 1.0000 mg | INTRAMUSCULAR | Status: DC | PRN

## 2011-07-14 MED ORDER — ATORVASTATIN CALCIUM 40 MG PO TABS
40.0000 mg | ORAL_TABLET | Freq: Every day | ORAL | Status: DC
  Administered 2011-07-16 – 2011-07-17 (×2): 40 mg via ORAL
  Filled 2011-07-14 (×3): qty 1

## 2011-07-14 MED ORDER — CLOPIDOGREL BISULFATE 75 MG PO TABS
75.0000 mg | ORAL_TABLET | Freq: Every day | ORAL | Status: DC
  Administered 2011-07-16 – 2011-07-17 (×2): 75 mg via ORAL
  Filled 2011-07-14 (×3): qty 1

## 2011-07-14 MED ORDER — SODIUM CHLORIDE 0.9 % IJ SOLN: 3.0000 mL | INTRAMUSCULAR | Status: DC | PRN

## 2011-07-14 NOTE — ED Notes (Signed)
Attempted to call report x1. Unable to take report sts to call back in 10 min

## 2011-07-14 NOTE — ED Notes (Signed)
To ed for eval after pt unable to hold drink in mouth this am. Pt's wife noticed facial droop approx 3pm. Pt's wife states pt complained of right vision blurred. Pt has had HA for past week. Hx of stoke. CBG in triage 114.

## 2011-07-14 NOTE — ED Notes (Signed)
cbg was 104 

## 2011-07-14 NOTE — ED Notes (Signed)
IV team paged for pt.  
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
## 2011-07-14 NOTE — Code Documentation (Signed)
Code stroke called at 1650. Patient arrived at [facility] ED around 1600.  EDP seen at 1650, stroke team 1655, CT scan at 1651, CT read at 1657 lab at 1700.  As per patient and family, went to breakfast around 9 am and no problems noted, patient states that at 1300 he was drinking a diet coke and noticed it was dribbling out of his mouth. Told his wife around 1430 that he was having trouble swallowing. Patient states has also had a headache for a week.  CT (-).  NIHSS 10 had prior deficits from a prior stroke in 2009.  Cancelled at 1728
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
## 2011-07-15 ENCOUNTER — Inpatient Hospital Stay (HOSPITAL_COMMUNITY): Payer: Medicare Other

## 2011-07-15 ENCOUNTER — Encounter (HOSPITAL_COMMUNITY): Payer: Self-pay | Admitting: *Deleted

## 2011-07-15 DIAGNOSIS — F329 Major depressive disorder, single episode, unspecified: Secondary | ICD-10-CM | POA: Diagnosis present

## 2011-07-15 LAB — HEMOGLOBIN A1C
Hgb A1c MFr Bld: 5.6 % (ref <5.7)
Mean Plasma Glucose: 114 mg/dL (ref <117)

## 2011-07-15 LAB — GLUCOSE, CAPILLARY: Glucose-Capillary: 159 mg/dL — ABNORMAL HIGH (ref 70–99)

## 2011-07-15 LAB — LIPID PANEL
HDL: 36 mg/dL — ABNORMAL LOW (ref >39)
Total CHOL/HDL Ratio: 2.8 RATIO
Triglycerides: 81 mg/dL (ref <150)

## 2011-07-15 MED ORDER — INSULIN ASPART 100 UNIT/ML ~~LOC~~ SOLN
0.0000 [IU] | Freq: Three times a day (TID) | SUBCUTANEOUS | Status: DC
Start: 2011-07-15 — End: 2011-07-17
  Administered 2011-07-16: 3 [IU] via SUBCUTANEOUS
  Administered 2011-07-17: 2 [IU] via SUBCUTANEOUS

## 2011-07-15 MED ORDER — INSULIN GLARGINE 100 UNIT/ML ~~LOC~~ SOLN
20.0000 [IU] | Freq: Every day | SUBCUTANEOUS | Status: DC
Start: 2011-07-15 — End: 2011-07-17
  Administered 2011-07-16 – 2011-07-17 (×2): 20 [IU] via SUBCUTANEOUS
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
## 2011-07-15 NOTE — Progress Notes (Signed)
PT Cancellation Note  Treatment cancelled today due to medical issues with patient which prohibited therapy. Pt is currently still on bedrest. Will reattempt pending increase in activity orders. Thank you, 07/15/2011 Milana Kidney DPT PAGER: [phone] OFFICE: [phone]    Milana Kidney 07/15/2011, 11:00 AM

## 2011-07-15 NOTE — Evaluation (Signed)
Clinical/Bedside Swallow Evaluation Patient Details  Name: [name] MRN: [id number] DOB: January 31, 1936 Today's Date: 07/15/2011  Past Medical History:  Past Medical History  Diagnosis Date  . Stroke   . Blind left eye    Past Surgical History: History reviewed. No pertinent past surgical history. HPI:  76 y/o male admitted to ED on 07/14/11 with slurred speech and difficulty swallowing.   CT of brain indicates no acute intracranial abnormality .  Results of MRI indicates artifact versus tiny acute infarct left paracentral lower pons.  Patient referred for a BSE secondary to not passing RN swallow screen.  Patient's spouse, who was present during evaluation, provided PMH .  Spouse reports history of CVA and dysphagia/PEG tube placement 4 years prior.    Prior reports not available in echart.  Spouse states patient had severe "choking" episode while eating chicken this past year and that he avoids cooked meats and prefers to eat cooked vegetables.  Spouse agrees that patient's speech is different from baseline this episode.   Assessment/Recommendations/Treatment Plan    SLP Assessment Clinical Impression Statement: Minimal  oral dysphagia marked by reduction in labial muscle tension and tone on right with decreased lingual coordination and ROM.  No residuals noted on affected side s/p swallow with liquid or puree trials.  Moderate pharyngeal dysphagia marked by delay in initiation and decreased laryngeal elevation with  +s/s of aspiration during and s/p swallow of thin liquid trials by spoon and cup. " Wet" cough observed moderately after swallow evaluation but spouse reports this is baseline for patient.  Patient refused solid trials stating he could not chew crackers. Spouse stated she thought patient didn't "like" crackers not that he couldn't chew them.  Recommend the following:  Dysphagia 1 (puree) secondary to history of difficulty managing solids and now with increased  weakness  Nectar thick liquids  Aspiration precautions  Supervision with all meals  Crush medication and administer in puree  Objective assessment of MBS to assess risk for aspiration secondary to prior history of dysphagia and current episode  ST indicated in acute care setting for diet tolerance and possible safe, diet advancement. Other Related Risk Factors: History of dysphagia;Previous CVA  Swallow Evaluation Recommendations Recommended Consults: MBS Solid Consistency: Dysphagia 1 (Puree) Liquid Consistency: Nectar Liquid Administration via: No straw;Cup Medication Administration: Crushed with puree Supervision: Full supervision/cueing for compensatory strategies Compensations: Slow rate;Small sips/bites;Clear throat intermittently Postural Changes and/or Swallow Maneuvers: Seated upright 90 degrees;Upright 30-60 min after meal Oral Care Recommendations: Oral care QID Other Recommendations: Order thickener from pharmacy;Prohibited food (jello, ice cream, thin soups);Remove water pitcher;Clarify dietary restrictions  Treatment Plan Speech Therapy Frequency: min 2x/week Treatment Duration: 2 weeks Interventions: Aspiration precaution training;Compensatory techniques;Patient/family education;Diet toleration management by SLP;Trials of upgraded texture/liquids  Prognosis Prognosis for Safe Diet Advancement: Good  Individuals Consulted Consulted and Agree with Results and Recommendations: Family member/caregiver;Patient;RN  Swallowing Goals  SLP Swallowing Goals Patient will consume recommended diet without observed clinical signs of aspiration with: Supervision/safety Patient will utilize recommended strategies during swallow to increase swallowing safety with: Supervision/safety   General  Date of Onset: 07/14/11 HPI: 76 y/o male admitted to ED on 07/14/11 with slurred speech and difficulty swallowing.   CT of brain indicates no acute intracranial abnormality .  Results of  MRI indicates artifact versus tiny acute infarct left paracentral lower pons.  Patient referred for a BSE secondary to not passing RN swallow screen.  Type of Study: Bedside swallow evaluation Diet Prior to this Study: NPO  Respiratory Status: Room air History of Intubation: No Behavior/Cognition: Alert;Impulsive Oral Cavity - Dentition: Edentulous Vision: Impaired for self-feeding Patient Positioning: Upright in bed Baseline Vocal Quality: Clear Volitional Cough: Strong Volitional Swallow: Able to elicit  Oral Motor/Sensory Function  Overall Oral Motor/Sensory Function: Impaired Labial ROM: Reduced right Labial Symmetry: Abnormal symmetry right Labial Strength: Within Functional Limits Labial Sensation: Reduced Lingual ROM: Reduced right Lingual Symmetry: Abnormal symmetry right Lingual Strength: Reduced Lingual Sensation: Reduced Facial ROM: Reduced right Facial Symmetry: Right droop Facial Strength: Reduced Facial Sensation: Reduced Velum: Impaired right Mandible: Within Functional Limits  Consistency Results  Ice Chips Ice chips: Impaired Presentation: Spoon Oral Phase Impairments: Reduced labial seal Pharyngeal Phase Impairments: Delayed Swallow;Decreased hyoid-laryngeal movement;Wet Vocal Quality;Throat Clearing - Delayed  Thin Liquid Thin Liquid: Impaired Presentation: Cup;Spoon Pharyngeal  Phase Impairments: Delayed Swallow;Decreased hyoid-laryngeal movement;Cough - Immediate;Wet Vocal Quality;Throat Clearing - Immediate  Nectar Thick Liquid Nectar Thick Liquid: Impaired Presentation: Cup Oral Phase Impairments: Reduced labial seal Pharyngeal Phase Impairments: Delayed Swallow;Decreased hyoid-laryngeal movement;Throat Clearing - Delayed  Honey Thick Liquid Honey Thick Liquid: Impaired Presentation: Cup Oral Phase Impairments: Reduced labial seal Pharyngeal Phase Impairments: Delayed Swallow;Decreased hyoid-laryngeal movement;Throat Clearing -  Delayed  Puree Puree: Impaired Presentation: Spoon Oral Phase Impairments: Reduced labial seal Pharyngeal Phase Impairments: Delayed Swallow;Decreased hyoid-laryngeal movement  Solid Solid: Not tested Other Comments: Patient refused trials of solids.  Moreen Fowler M.S., CCC-SLP [phone]  [facility] 07/15/2011,12:42 PM

## 2011-07-15 NOTE — Progress Notes (Signed)
OT Cancellation Note  Treatment cancelled today due to patient receiving MRI.  Also, pt with bedrest orders.  Will re-attempt pending increase in activity orders. Thanks!  07/15/2011 Cipriano Mile OTR/L Pager [phone] Office [phone]

## 2011-07-15 NOTE — Progress Notes (Signed)
*  PRELIMINARY RESULTS* Echocardiogram 2D Echocardiogram has been performed.  Glean Salen [facility] 07/15/2011, 11:58 AM
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
## 2011-07-15 NOTE — Progress Notes (Signed)
VASCULAR LAB PRELIMINARY  PRELIMINARY  PRELIMINARY  PRELIMINARY  Carotid duplex  completed.    Preliminary report:  Previous study of 05-16-07 showed right ICA occlusion and left 60-79% ICA stenosis.  Today there is possible recanalization of the right ICA.  Minimal flow is noted.  On the left, no significant stenosis is noted.  Previous stenotic velocities may have been compensatory flow.  Bilateral vertebral artery flow is antegrade.  Terance Hart, RVT 07/15/2011, 10:12 AM

## 2011-07-16 ENCOUNTER — Other Ambulatory Visit: Payer: Self-pay

## 2011-07-16 DIAGNOSIS — I6529 Occlusion and stenosis of unspecified carotid artery: Secondary | ICD-10-CM

## 2011-07-16 LAB — GLUCOSE, CAPILLARY
Glucose-Capillary: 114 mg/dL — ABNORMAL HIGH (ref 70–99)
Glucose-Capillary: 107 mg/dL — ABNORMAL HIGH (ref 70–99)
Glucose-Capillary: 184 mg/dL — ABNORMAL HIGH (ref 70–99)

## 2011-07-16 MED ORDER — STARCH (THICKENING) PO POWD
ORAL | Status: DC | PRN
Start: 2011-07-16 — End: 2011-07-16
  Filled 2011-07-16: qty 227

## 2011-07-16 MED ORDER — FOOD THICKENER (THICKENUP CLEAR)
ORAL | Status: DC | PRN
  Filled 2011-07-16: qty 120

## 2011-07-16 NOTE — Progress Notes (Signed)
   CARE MANAGEMENT NOTE 07/16/2011  Patient:  LADARREN, STEINER   Account Number:  [id number]  Date Initiated:  07/16/2011  Documentation initiated by:  Darlyne Russian  Subjective/Objective Assessment:   Patient admitted with flacial droop and slurred speech. He has hx of previous L CVA     Action/Plan:   Patient lives at home with his spouse, who is the primary caregiver.   Anticipated DC Date:  07/17/2011   Anticipated DC Plan:  HOME W HOME HEALTH SERVICES      DC Planning Services  CM consult      Choice offered to / List presented to:             Status of service:  In process, will continue to follow Medicare Important Message given?   (If response is "NO", the following Medicare IM given date fields will be blank) Date Medicare IM given:   Date Additional Medicare IM given:    Discharge Disposition:    Per UR Regulation:    If discussed at Long Length of Stay Meetings, dates discussed:    Comments:  PCP:  Dr Tori Milks  07/16/2011  2:00 pm Darlyne Russian RN, CCM [phone]  Met with patient and spouse. She is the primary caregiver at home and currently no home health care services. She noted in the past had [facility] for RN. DME: hospital bed, wheelchair, power wheelchair, lift chair. She gives the patient a bed bath, not in the shower.  CM to continue to follow for discharge planning needs.

## 2011-07-16 NOTE — Progress Notes (Signed)
Stroke Team Progress Note  HISTORY [name] is an 76 y.o. male who was out with his wife 07/14/2011 doing errands. Drank a soda around 1300 and noted that the drink was coming out of his mouth. He did not tell his wife at that time. His wife did not note anything different and she did not feel there was a change in his speech. Later around 1430 the patient reported to his wife that he was unable to handle liquids in his mouth and she felt that his mouth was drooped more. The patient reports that he did not note any difference other than his ability to swallow.. When not trying to drink he did not notice any changes in himself. He was last seen normal at 0900. He did not receive tPA as he presented beyond the tPA window. He was admitted for further evaluation.  SUBJECTIVE His wife is at the bedside. Overall he feels his condition is unchanged. The patient is w/c bound at home from old large stroke with spastic left hemiparesis. She uses a lift chair.  OBJECTIVE Most recent Vital Signs: Temp: 97.8 F (36.6 C) (03/25 0950) Temp src: Oral (03/25 0950) BP: 172/86 mmHg (03/25 0950) Pulse Rate: 75  (03/25 0950) Respiratory Rate: 20 O2 Saturation: 96%  CBG (last 3)  Basename 07/16/11 0703 07/15/11 2241 07/15/11 1656  GLUCAP 107* 159* 139*   Intake/Output from previous day: 03/24 0701 - 03/25 0700 In: -  Out: 250 [Urine:250]  IV Fluid Intake:     . sodium chloride 100 mL/hr at 07/16/11 0241   Medications    . amitriptyline  25 mg Oral QHS  . aspirin EC  81 mg Oral Daily  . atorvastatin  40 mg Oral Daily  . clopidogrel  75 mg Oral Daily  . DULoxetine  30 mg Oral QODAY  . heparin  5,000 Units Subcutaneous Q8H  . insulin aspart  0-15 Units Subcutaneous TID WC  . insulin glargine  20 Units Subcutaneous Daily  . levothyroxine  150 mcg Oral QAC breakfast  . sodium chloride  3 mL Intravenous Q12H  . traMADol-acetaminophen  1 tablet Oral BID  PRN sodium chloride, food thickener,  morphine injection, senna-docusate, sodium chloride, DISCONTD: food thickener  Diet:  Dysphagia 1 nectar thick Activity:  Activity as tolerated DVT Prophylaxis:  Subcutaneous heparin, 5000 units every 8 hours  Significant Diagnostic Studies: CBC    Component Value Date/Time   WBC 9.6 07/14/2011 1706   RBC 4.39 07/14/2011 1706   HGB 13.1 07/14/2011 1706   HCT 40.1 07/14/2011 1706   PLT 214 07/14/2011 1706   MCV 91.3 07/14/2011 1706   MCH 29.8 07/14/2011 1706   MCHC 32.7 07/14/2011 1706   RDW 13.9 07/14/2011 1706   LYMPHSABS 3.3 07/14/2011 1706   MONOABS 0.7 07/14/2011 1706   EOSABS 0.1 07/14/2011 1706   BASOSABS 0.0 07/14/2011 1706   CMP    Component Value Date/Time   NA 142 07/14/2011 1706   K 3.6 07/14/2011 1706   CL 105 07/14/2011 1706   CO2 29 07/14/2011 1706   GLUCOSE 117* 07/14/2011 1706   BUN 10 07/14/2011 1706   CREATININE 0.70 07/14/2011 1706   CALCIUM 9.1 07/14/2011 1706   PROT 6.6 07/14/2011 1706   ALBUMIN 3.3* 07/14/2011 1706   AST 22 07/14/2011 1706   ALT 16 07/14/2011 1706   ALKPHOS 79 07/14/2011 1706   BILITOT 0.3 07/14/2011 1706   GFRNONAA >90 07/14/2011 1706   GFRAA >90 07/14/2011 1706  COAGS Lab Results  Component Value Date   INR 1.02 07/14/2011   INR 1.0 10/28/2008   INR 1.1 02/20/2008   Lipid Panel    Component Value Date/Time   CHOL 100 07/15/2011 1035   TRIG 81 07/15/2011 1035   HDL 36* 07/15/2011 1035   CHOLHDL 2.8 07/15/2011 1035   VLDL 16 07/15/2011 1035   LDLCALC 48 07/15/2011 1035   HgbA1C  Lab Results  Component Value Date   HGBA1C 5.6 07/15/2011   Urine Drug Screen  No results found for this basename: labopia,  cocainscrnur,  labbenz,  amphetmu,  thcu,  labbarb    Alcohol Level No results found for this basename: eth   CT of the brain   1. No or acute intracranial abnormality.  2. Stable old right MCA distribution infarct, chronic small vessel  disease, and diffuse cerebral atrophy.  MRI of the brain   Question artifact versus tiny acute infarct  left paracentral lower pons  MRA of the brain   Right internal carotid artery appears occluded.  Branch vessel irregularity as detailed above.  2D Echocardiogram  performed  Carotid Doppler  Previous study of 05-16-07 showed right ICA occlusion and left 60-79% ICA stenosis. Today there is possible recanalization of the right ICA. Minimal flow is noted. On the left, no significant stenosis is noted. Previous stenotic velocities may have been compensatory flow. Bilateral vertebral artery flow is antegrade   CXR  Enlargement of cardiac silhouette. Bibasilar atelectasis  EKG  results not available  Physical Exam   The patient is alert and cooperative at the time of the evaluation.  The patient has a significant left hemiparesis 0/5 strength, increased motor tone of the left arm and left leg. The patient is not able to elevate the left arm or the left leg off of the bed.  The patient has good strength of the right upper extremity, has some limitation with elevation of the right leg. The patient is nonambulatory, could not be ambulated.  The patient is able to perform finger-nose-finger with right arm only.  Visual fields are full. Speech is somewhat dysarthric, not aphasic.  Extraocular movements with the right eye are full. The patient has incomplete abduction of the left eye. 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 Right pan facial weakness with lower half more affected than upper half of face..  ASSESSMENT [name] is a 76 y.o. male with a right pontine stroke, secondary to small vessel disease. On aspirin 81 mg orally every day and clopidogrel 75 mg orally every day for secondary stroke prevention.  Stroke risk factors:  carotid stenosis, hyperlipidemia and Peripheral vascular disease  The patient has a prior left hemiparesis that is significant following a right internal carotid artery occlusion 4 years ago. The patient has now developed some  dysarthria, and facial droop associated with a small right pontine stroke. This is likely related to small vessel ischemic deficit, not embolic.   Hospital day # 2  TREATMENT/PLAN -Continue aspirin 81 mg orally every day and clopidogrel 75 mg orally every day for secondary stroke prevention. plavix preferred given hx of peripheral vascular disease. Would not consider Aggrenox. -Follow up 2-D echo cardiogram - consider ACCELERATE trial. [facility] will follow up with the pt. - D/w patient and wife and answered questions.  Joaquin Music, ANP-BC, GNP-BC [facility] Pager: [phone] 07/16/2011 11:02 AM  Dr. Delia Heady, Stroke Center Medical Director, has  personally reviewed chart, pertinent data, examined the patient and developed the plan of care.

## 2011-07-16 NOTE — Progress Notes (Signed)
OT Discharge Note  Patient is being discharged from OT services secondary to:    Pt at baseline functioning per PT evaluation and wife's report  Please see latest Therapy Progress Note for current level of functioning and progress toward goals.  Progress and discharge plan and discussed with patient/caregiver and they    Agree     NO ACUTE OT NEEDS AT THIS TIME   Lucile Shutters   OTR/L Pager: [phone] Office: [phone] .

## 2011-07-16 NOTE — Evaluation (Signed)
Physical Therapy Evaluation Patient Details Name: [name] MRN: [id number] DOB: 19-Jul-1935 Today's Date: 07/16/2011  Problem List:  Patient Active Problem List  Diagnoses  . CONSTIPATION  . Slurred speech  . Right pontine stroke  . Hypothyroidism  . Depression    Past Medical History:  Past Medical History  Diagnosis Date  . Stroke   . Blind left eye    Past Surgical History: History reviewed. No pertinent past surgical history.  PT Assessment/Plan/Recommendation PT Assessment Clinical Impression Statement: Patient presents with acute infarct left paracentral lower pons with an NIHSS 10. He is max assistance for care PTA including bed to chair transfers only and inability to ambulate. Patient is at baseline therefore no skilled PT needs. PT Recommendation/Assessment: Patent does not need any further PT services No Skilled PT: Patient will have necessary level of assist by caregiver at discharge;Patient at baseline level of functioning PT Goals     PT Evaluation Precautions/Restrictions  Precautions Precautions: Fall Prior Functioning  Home Living Lives With: Spouse Receives Help From: Family Type of Home: House Home Layout: One level Home Access: Stairs to enter [employer] of Steps: 1/2 step Firefighter:  (wears depends) Home Adaptive Equipment: Wheelchair - powered;Wheelchair - manual;Hospital bed (lift chair) Prior Function Level of Independence: Needs assistance with homemaking;Needs assistance with tranfers Meal Prep:  (unable) Light Housekeeping:  (unable) Vocation: Retired Financial risk analyst Arousal/Alertness: Awake/alert Orientation Level: Oriented X4 Sensation/Coordination Sensation Light Touch: Impaired Detail Additional Comments: decreased senstion toes of right foot Coordination Gross Motor Movements are Fluid and Coordinated: No Coordination and Movement Description: Tendency for mass movement right side Extremity  Assessment RLE Assessment RLE Assessment: Within Functional Limits (except dorsiflexion to neutral only) LLE Assessment LLE Assessment: Exceptions to WFL LLE AROM (degrees) Overall AROM Left Lower Extremity: Deficits;Due to premorbid status LLE Overall AROM Comments: Contractures of left leg into extensor tone pattern LLE Strength LLE Overall Strength: Deficits;Due to premorbid status LLE Overall Strength Comments: 0/5 Mobility (including Balance) Bed Mobility Bed Mobility: Yes Supine to Sit: 2: Max assist Supine to Sit Details (indicate cue type and reason): Wife performing transfer - verbal cues to not pull on right arm Sitting - Scoot to Edge of Bed: 2: Max assist Transfers Transfers: Yes Stand Pivot Transfers: 2: Max Actuary Details (indicate cue type and reason): Wife performing transfer - patient at baseline  Balance Balance Assessed: Yes Static Sitting Balance Static Sitting - Balance Support: Right upper extremity supported;Feet supported Static Sitting - Level of Assistance: 5: Stand by assistance    End of Session PT - End of Session Activity Tolerance: Patient tolerated treatment well Patient left: in chair;with call bell in reach;with family/visitor present General Behavior During Session: [name] for tasks performed Cognition: [name] for tasks performed  Edwyna Perfect, PT  Pager [phone]  07/16/2011, 11:52 AM

## 2011-07-16 NOTE — Consult Note (Signed)
We were asked to consult regarding right internal carotid artery occlusion. The patient was admitted with some difficulty with weakness of his left lip and some difference in his swallowing ability. He denies any change of motor function in his extremities. He is status post a major right brain stroke with left side paralysis. This occurred approximately 4 years ago with minimal recovery. Despite this he and his wife functioned quite well with her being able to transfer him from bed to chair. They feel that he is at his baseline except for some potentially mild lip weakness on the left. Workup included MRI which suggested possible right pontine new event. This did show his known right internal carotid artery occlusion. He is known to me from a prior aortofemoral bypass graft approximately 12 years ago.   Past Medical History  Diagnosis Date  . Stroke   . Blind left eye     History  Substance Use Topics  . Smoking status: Never Smoker   . Smokeless tobacco: Not on file  . Alcohol Use: No    History reviewed. No pertinent family history.  Allergies  Allergen Reactions  . Hydromorphone Hcl Other (See Comments)    "makes him crazy" per wife  . Penicillins Hives    Current facility-administered medications:0.9 %  sodium chloride infusion, , Intravenous, Continuous, Ward Givens, MD, Last Rate: 100 mL/hr at 07/16/11 0241;  0.9 %  sodium chloride infusion, 250 mL, Intravenous, PRN, Penny Pia, MD;  amitriptyline (ELAVIL) tablet 25 mg, 25 mg, Oral, QHS, Penny Pia, MD, 25 mg at 07/15/11 2148;  aspirin EC tablet 81 mg, 81 mg, Oral, Daily, Penny Pia, MD, 81 mg at 07/16/11 0943 atorvastatin (LIPITOR) tablet 40 mg, 40 mg, Oral, Daily, Penny Pia, MD, 40 mg at 07/16/11 0943;  clopidogrel (PLAVIX) tablet 75 mg, 75 mg, Oral, Daily, Penny Pia, MD, 75 mg at 07/16/11 0943;  DULoxetine (CYMBALTA) DR capsule 30 mg, 30 mg, Oral, QODAY, Penny Pia, MD, 30 mg at 07/16/11 0943;  food thickener  (RESOURCE THICKENUP CLEAR) powder, , Oral, PRN, Penny Pia, MD heparin injection 5,000 Units, 5,000 Units, Subcutaneous, Q8H, Penny Pia, MD, 5,000 Units at 07/16/11 1406;  insulin aspart (novoLOG) injection 0-15 Units, 0-15 Units, Subcutaneous, TID WC, Penny Pia, MD, 3 Units at 07/16/11 1230;  insulin glargine (LANTUS) injection 20 Units, 20 Units, Subcutaneous, Daily, York Spaniel, MD, 20 Units at 07/16/11 1610 levothyroxine (SYNTHROID, LEVOTHROID) tablet 150 mcg, 150 mcg, Oral, QAC breakfast, Penny Pia, MD, 150 mcg at 07/16/11 [phone];  morphine 2 MG/ML injection 1 mg, 1 mg, Intravenous, Q4H PRN, Rolan Lipa, NP, 1 mg at 07/15/11 1829;  senna-docusate (Senokot-S) tablet 1 tablet, 1 tablet, Oral, QHS PRN, Penny Pia, MD;  sodium chloride 0.9 % injection 3 mL, 3 mL, Intravenous, Q12H, Penny Pia, MD, 3 mL at 07/16/11 0944 sodium chloride 0.9 % injection 3 mL, 3 mL, Intravenous, PRN, Penny Pia, MD;  traMADol-acetaminophen (ULTRACET) 37.5-325 MG per tablet 1 tablet, 1 tablet, Oral, BID, Penny Pia, MD, 1 tablet at 07/16/11 [phone];  DISCONTD: food thickener (THICK IT) powder, , Oral, PRN, Penny Pia, MD  BP 150/71  Pulse 65  Temp(Src) 97.4 F (36.3 C) (Oral)  Resp 18  Ht 5\' 9"  (1.753 m)  Wt 193 lb (87.544 kg)  BMI 28.50 kg/m2  SpO2 96%  Body mass index is 28.50 kg/(m^2).       Physical exam: Well-developed white male in no acute distress. He has had a major stroke with flaccid  left arm and leg. He is not normal carotid pulse bilaterally. Abdominal exam is nontender no masses he has a well-healed midline incision does have 2+ femoral pulses bilaterally.  MRI shows known right internal carotid artery occlusion  Impression and plan: A right brain stroke 4 years ago related to right internal carotid artery occlusion. No evidence of any acute event from the anterior circulation. I discussed this with the patient and his wife present explaining there is no need and  no role for treatment of his occluded carotid artery.  For this discussion we will see him again on an as-needed basis

## 2011-07-16 NOTE — Progress Notes (Signed)
INITIAL ADULT NUTRITION ASSESSMENT Date: 07/16/2011   Time: 11:51 AM  Reason for Assessment: Nutrition Risk: Dysphagia  ASSESSMENT: Male 76 y.o.  Dx: Slurred speech  Hx:  Past Medical History  Diagnosis Date  . Stroke   . Blind left eye     Related Meds:     . amitriptyline  25 mg Oral QHS  . aspirin EC  81 mg Oral Daily  . atorvastatin  40 mg Oral Daily  . clopidogrel  75 mg Oral Daily  . DULoxetine  30 mg Oral QODAY  . heparin  5,000 Units Subcutaneous Q8H  . insulin aspart  0-15 Units Subcutaneous TID WC  . insulin glargine  20 Units Subcutaneous Daily  . levothyroxine  150 mcg Oral QAC breakfast  . sodium chloride  3 mL Intravenous Q12H  . traMADol-acetaminophen  1 tablet Oral BID    Ht: 5\' 9"  (175.3 cm)  Wt: 193 lb (87.544 kg)  Ideal Wt: 72.7 kg  % Ideal Wt: 120%  Usual Wt: 173 lbs (78.6 kg )per family report % Usual Wt: 111%  Body mass index is 28.50 kg/(m^2). Overweight  Food/Nutrition Related Hx:   Patient has had a gradual 20 lbs weight gain over past four years since stroke. Patient does not consume cooked meats, but will eat variety of beans as protein source. Occasionally eats cooked vegetables. Appetite prior to admission was good, as patient's wife takes care of patient full time and prepares all meals. Current PO intake is < 50% of meals.  Evaluated by SLP on 3/25. Recommended diet be advanced to Dys2, thin liquids, and supervision with all meals based on history of difficulty managing solids and increased weakness. Patient has Stage II pressure ulcer on left heel and stage I pressure ulcer on right heel. Wheelchair bound at home. No BMs documented.  Labs:  CMP     Component Value Date/Time   NA 142 07/14/2011 1706   K 3.6 07/14/2011 1706   CL 105 07/14/2011 1706   CO2 29 07/14/2011 1706   GLUCOSE 117* 07/14/2011 1706   BUN 10 07/14/2011 1706   CREATININE 0.70 07/14/2011 1706   CALCIUM 9.1 07/14/2011 1706   PROT 6.6 07/14/2011 1706   ALBUMIN 3.3*  07/14/2011 1706   AST 22 07/14/2011 1706   ALT 16 07/14/2011 1706   ALKPHOS 79 07/14/2011 1706   BILITOT 0.3 07/14/2011 1706   GFRNONAA >90 07/14/2011 1706   GFRAA >90 07/14/2011 1706    Intake:   Intake/Output Summary (Last 24 hours) at 07/16/11 1151 Last data filed at 07/16/11 4098  Gross per 24 hour  Intake    120 ml  Output    650 ml  Net   -530 ml     Diet Order: [id number], Nectar Thick  Supplements/Tube Feeding: N/A  IVF:     sodium chloride Last Rate: 100 mL/hr at 07/16/11 0241    Estimated Nutritional Needs:   Kcal:1950 - 2150 Protein: 110 - 120 gm Fluid: > 1.9 L  Due to poor PO intake, and increased protein needs related to two pressure ulcers, discussed supplement options with patient's wife. Patient had tried Ensure supplement with his previous stroke, and his wife was not confident that patient would consume them. Will try MagicCup supplement as patient's wife stated patient did like puddings at home.   Patient's wife seemed concerned in patient's lack of exercise at home, as he is wheelchair bound and has gradually gained 11% of his usual body weight over four  years. Was evaluated by PT on 3/25, reported patient at baseline level of functioning, and no skilled PT needs are required.   NUTRITION DIAGNOSIS: -Inadequate oral intake (NI-2.1).  Status: Ongoing  RELATED TO: decreased appetite, history of dysphagia  AS EVIDENCE BY: <50% of meals consumed  MONITORING/EVALUATION(Goals): Goal: Consume >/=90% of estimated needs Monitor: Supplement tolerance, weights, labs, PO intake  EDUCATION NEEDS: -No education needs identified at this time  INTERVENTION:  RD to add MagicCup dessert supplement in [employer] TID to provide 870 kcal, and 27 gram protein  RD to follow nutrition care plan  Dietitian #[phone]  DOCUMENTATION CODES Per approved criteria  -Not Applicable    [name] 07/16/2011, 11:51 AM  [name] Pager#: [phone]
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
## 2011-07-16 NOTE — Progress Notes (Signed)
Speech Language Pathology Dysphagia Treatment  Patient Details Name: [name] MRN: [id number] DOB: 1936-01-08 Today's Date: 07/16/2011 11:00-11:24 No pain.   SLP Assessment/Plan/Recommendation Assessment / Recommendations / Plan Clinical Impression Statement: min oral dysphagia as previously noted on eval. Pt appears with stronger pharyngeal swallow today, no overt s/s of aspiration with thins or nectars by cup. Straw drinking did result in increased PO rate and bolus size and one delayed cough was noted. Pt is very agitated regarding his current diet and thickened liquids. States he will not eat or drink. Given improved performance on evaluation of swallow at bedside today, SLP recommends trial period of upgraded diet to D 2 with thin liquids, NO straws. Clear throat intermittantly. Full supervision. Crush meds in puree.  Plan: Continue with current plan of care Swallowing Goals  SLP Swallowing Goals Patient will consume recommended diet without observed clinical signs of aspiration with: Supervision/safety Swallow Study Goal #1 - Progress: Progressing toward goal Patient will utilize recommended strategies during swallow to increase swallowing safety with: Supervision/safety Swallow Study Goal #2 - Progress: Progressing toward goal  General Respiratory Status: Room air Behavior/Cognition: Alert;Cooperative;Hard of hearing;Impulsive Oral Cavity - Dentition: Edentulous Patient Positioning: Upright in bed  Oral Cavity - Oral Hygiene Does patient have any of the following "at risk" factors?: Other - dysphagia Patient is HIGH RISK - Oral Care Protocol followed (see row info): Yes   Dysphagia Treatment Treatment focused on: Skilled observation of diet tolerance;Upgraded PO texture trials;Patient/family/caregiver education;Utilization of compensatory strategies Family/Caregiver Educated: wife voiced understanding of need for patient to eat/drink slowly with small bite/sip and slow  pace.  Treatment Methods/Modalities: Skilled observation;Effortful swallow Patient observed directly with PO's: Yes Type of PO's observed:  (also assessed with soda per Pt request ) Feeding: Able to feed self;Needs assist Liquids provided via:  (increased bolus size & Po rate with straws. ) Oral Phase Signs & Symptoms: Anterior loss/spillage (anterior right side liquid spillage ) Pharyngeal Phase Signs & Symptoms: Other (comment) (delayed cough noted on with straw sips ) Type of cueing: Verbal;Tactile Amount of cueing: Maximal   [name] 07/16/2011, 11:39 AM

## 2011-07-16 NOTE — Discharge Instructions (Signed)
STROKE/TIA DISCHARGE INSTRUCTIONS SMOKING Cigarette smoking nearly doubles your risk of having a stroke & is the single most alterable risk factor  If you smoke or have smoked in the last 12 months, you are advised to quit smoking for your health.  Most of the excess cardiovascular risk related to smoking disappears within a year of stopping.  Ask you doctor about anti-smoking medications  [place] Quit Line: 1-800-QUIT NOW  Free Smoking Cessation Classes [phone]  CHOLESTEROL Know your levels; limit fat & cholesterol in your diet  Lipid Panel     Component Value Date/Time   CHOL 100 07/15/2011 1035   TRIG 81 07/15/2011 1035   HDL 36* 07/15/2011 1035   CHOLHDL 2.8 07/15/2011 1035   VLDL 16 07/15/2011 1035   LDLCALC 48 07/15/2011 1035      Many patients benefit from treatment even if their cholesterol is at goal.  Goal: Total Cholesterol (CHOL) less than 160  Goal:  Triglycerides (TRIG) less than 150  Goal:  HDL greater than 40  Goal:  LDL (LDLCALC) less than 100   BLOOD PRESSURE American Stroke Association blood pressure target is less that 120/80 mm/Hg  Your discharge blood pressure is:  BP: 172/86 mmHg  Monitor your blood pressure  Limit your salt and alcohol intake  Many individuals will require more than one medication for high blood pressure  DIABETES (A1c is a blood sugar average for last 3 months) Goal HGBA1c is under 7% (HBGA1c is blood sugar average for last 3 months)  Diabetes: {STROKE DC DIABETES:22357}    Lab Results  Component Value Date   HGBA1C 5.6 07/15/2011     Your HGBA1c can be lowered with medications, healthy diet, and exercise.  Check your blood sugar as directed by your physician  Call your physician if you experience unexplained or low blood sugars.  PHYSICAL ACTIVITY/REHABILITATION Goal is 30 minutes at least 4 days per week    {STROKE DC ACTIVITY/REHAB:22359}  Activity decreases your risk of heart attack and stroke and makes your heart  stronger.  It helps control your weight and blood pressure; helps you relax and can improve your mood.  Participate in a regular exercise program.  Talk with your doctor about the best form of exercise for you (dancing, walking, swimming, cycling).  DIET/WEIGHT Goal is to maintain a healthy weight  Your discharge diet is: Dysphagia *** liquids Your height is:  Height: 5\' 9"  (175.3 cm) Your current weight is: Weight: 87.544 kg (193 lb) Your Body Mass Index (BMI) is:  BMI (Calculated): 28.6   Following the type of diet specifically designed for you will help prevent another stroke.  Your goal weight range is:  ***  Your goal Body Mass Index (BMI) is 19-24.  Healthy food habits can help reduce 3 risk factors for stroke:  High cholesterol, hypertension, and excess weight.  RESOURCES Stroke/Support Group:  Call [phone]  they meet the 3rd Sunday of the month on the Rehab Unit at [facility], [place] ( no meetings June, July & Aug).  STROKE EDUCATION PROVIDED/REVIEWED AND GIVEN TO PATIENT Stroke warning signs and symptoms How to activate emergency medical system (call 911). Medications prescribed at discharge. Need for follow-up after discharge. Personal risk factors for stroke. Pneumonia vaccine given:   {STROKE DC YES/NO/DATE:22363} Flu vaccine given:   {STROKE DC YES/NO/DATE:22363} My questions have been answered, the writing is legible, and I understand these instructions.  I will adhere to these goals & educational materials that have been provided  to me after my discharge from the hospital.

## 2011-07-17 DIAGNOSIS — E1151 Type 2 diabetes mellitus with diabetic peripheral angiopathy without gangrene: Secondary | ICD-10-CM | POA: Diagnosis present

## 2011-07-17 LAB — GLUCOSE, CAPILLARY: Glucose-Capillary: 114 mg/dL — ABNORMAL HIGH (ref 70–99)

## 2011-07-17 NOTE — Progress Notes (Signed)
Stroke Team Progress Note  HISTORY [name] is an 76 y.o. male who was out with his wife 07/14/2011 doing errands. Drank a soda around 1300 and noted that the drink was coming out of his mouth. He did not tell his wife at that time. His wife did not note anything different and she did not feel there was a change in his speech. Later around 1430 the patient reported to his wife that he was unable to handle liquids in his mouth and she felt that his mouth was drooped more. The patient reports that he did not note any difference other than his ability to swallow.. When not trying to drink he did not notice any changes in himself. He was last seen normal at 0900. He did not receive tPA as he presented beyond the tPA window. He was admitted for further evaluation.  SUBJECTIVE Wife at bedside. She states they have [employer] and last received HH therapy 2 years ago.  OBJECTIVE Most recent Vital Signs: Temp: 98.7 F (37.1 C) (03/26 0600) BP: 172/78 mmHg (03/26 0600) Pulse Rate: 72  (03/26 0600) Respiratory Rate: 18 O2 Saturation: 96%  CBG (last 3)  Basename 07/17/11 0656 07/16/11 1639 07/16/11 1154  GLUCAP 114* 118* 184*   Intake/Output from previous day: 03/25 0701 - 03/26 0700 In: 1640 [P.O.:440; I.V.:1200] Out: 2000 [Urine:2000]  IV Fluid Intake:     . sodium chloride 100 mL/hr at 07/17/11 0700   Medications    . amitriptyline  25 mg Oral QHS  . aspirin EC  81 mg Oral Daily  . atorvastatin  40 mg Oral Daily  . clopidogrel  75 mg Oral Daily  . DULoxetine  30 mg Oral QODAY  . heparin  5,000 Units Subcutaneous Q8H  . insulin aspart  0-15 Units Subcutaneous TID WC  . insulin glargine  20 Units Subcutaneous Daily  . levothyroxine  150 mcg Oral QAC breakfast  . sodium chloride  3 mL Intravenous Q12H  . traMADol-acetaminophen  1 tablet Oral BID  PRN sodium chloride, food thickener, morphine injection, senna-docusate, sodium chloride, DISCONTD: food thickener  Diet:  Dysphagia 2  thin liquids Activity:  Activity as tolerated DVT Prophylaxis:  Subcutaneous heparin, 5000 units every 8 hours  Significant Diagnostic Studies: CBC    Component Value Date/Time   WBC 9.6 07/14/2011 1706   RBC 4.39 07/14/2011 1706   HGB 13.1 07/14/2011 1706   HCT 40.1 07/14/2011 1706   PLT 214 07/14/2011 1706   MCV 91.3 07/14/2011 1706   MCH 29.8 07/14/2011 1706   MCHC 32.7 07/14/2011 1706   RDW 13.9 07/14/2011 1706   LYMPHSABS 3.3 07/14/2011 1706   MONOABS 0.7 07/14/2011 1706   EOSABS 0.1 07/14/2011 1706   BASOSABS 0.0 07/14/2011 1706   CMP    Component Value Date/Time   NA 142 07/14/2011 1706   K 3.6 07/14/2011 1706   CL 105 07/14/2011 1706   CO2 29 07/14/2011 1706   GLUCOSE 117* 07/14/2011 1706   BUN 10 07/14/2011 1706   CREATININE 0.70 07/14/2011 1706   CALCIUM 9.1 07/14/2011 1706   PROT 6.6 07/14/2011 1706   ALBUMIN 3.3* 07/14/2011 1706   AST 22 07/14/2011 1706   ALT 16 07/14/2011 1706   ALKPHOS 79 07/14/2011 1706   BILITOT 0.3 07/14/2011 1706   GFRNONAA >90 07/14/2011 1706   GFRAA >90 07/14/2011 1706   COAGS Lab Results  Component Value Date   INR 1.02 07/14/2011   INR 1.0 10/28/2008   INR  1.1 02/20/2008   Lipid Panel    Component Value Date/Time   CHOL 100 07/15/2011 1035   TRIG 81 07/15/2011 1035   HDL 36* 07/15/2011 1035   CHOLHDL 2.8 07/15/2011 1035   VLDL 16 07/15/2011 1035   LDLCALC 48 07/15/2011 1035   HgbA1C  Lab Results  Component Value Date   HGBA1C 5.6 07/15/2011   Urine Drug Screen  No results found for this basename: labopia,  cocainscrnur,  labbenz,  amphetmu,  thcu,  labbarb    Alcohol Level No results found for this basename: eth   CT of the brain   1. No or acute intracranial abnormality.  2. Stable old right MCA distribution infarct, chronic small vessel  disease, and diffuse cerebral atrophy.  MRI of the brain   Question artifact versus tiny acute infarct left paracentral lower pons  MRA of the brain   Right internal carotid artery appears occluded.    Branch vessel irregularity as detailed above.  2D Echocardiogram  EF 50-55% with no source of embolus.   Carotid Doppler  Previous study of 05-16-07 showed right ICA occlusion and left 60-79% ICA stenosis. Today there is possible recanalization of the right ICA. Minimal flow is noted. On the left, no significant stenosis is noted. Previous stenotic velocities may have been compensatory flow. Bilateral vertebral artery flow is antegrade  CXR  Enlargement of cardiac silhouette. Bibasilar atelectasis  EKG  results not available  Physical Exam   The patient is alert and cooperative at the time of the evaluation.  The patient has a significant left hemiparesis 0/5 strength, increased motor tone of the left arm and left leg. The patient is not able to elevate the left arm or the left leg off of the bed.  The patient has good strength of the right upper extremity, has some limitation with elevation of the right leg. The patient is nonambulatory, could not be ambulated.  The patient is able to perform finger-nose-finger with right arm only.  Visual fields are full. Speech is somewhat dysarthric, not aphasic.  Extraocular movements with the right eye are full. The patient has incomplete abduction of the left eye. 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 Right pan facial weakness with lower half more affected than upper half of face..  ASSESSMENT [name] is a 76 y.o. male with a right pontine stroke, secondary to small vessel disease. On aspirin 81 mg orally every day and clopidogrel 75 mg orally every day for secondary stroke prevention.  Stroke risk factors:  carotid stenosis, hyperlipidemia and Peripheral vascular disease  The patient has a prior left hemiparesis that is significant following a right internal carotid artery occlusion 4 years ago. The patient has now developed some dysarthria, and facial droop associated with a small right pontine stroke.  This is likely related to small vessel ischemic deficit, not embolic.   Hospital day # 3  TREATMENT/PLAN -Continue aspirin 81 mg orally every day and clopidogrel 75 mg orally every day for secondary stroke prevention. plavix preferred given hx of peripheral vascular disease. Would not consider Aggrenox. - consider ACCELERATE trial. [facility] will follow up with the pt. -Recommend f/u therapy at home if insurance will allow, esp ST for swallow. Given dx of new stroke should allow. Will defer to attending MD. -Stroke Service will sign off. Follow up with Dr. Pearlean Brownie in 2 months.  SHARON BIBY, AVNP, ANP-BC, GNP-BC [facility] Pager: [phone]  07/17/2011 8:32 AM  Dr. Delia Heady, Stroke Center Medical Director, has personally reviewed chart, pertinent data, examined the patient and developed the plan of care.

## 2011-07-17 NOTE — Progress Notes (Signed)
   CARE MANAGEMENT NOTE 07/17/2011  Patient:  [name], [name]   Account Number:  [id number]  Date Initiated:  07/16/2011  Documentation initiated by:  Darlyne Russian  Subjective/Objective Assessment:   Patient admitted with flacial droop and slurred speech. He has hx of previous L CVA     Action/Plan:   Patient lives at home with his spouse, who is the primary caregiver.   Anticipated DC Date:  07/17/2011   Anticipated DC Plan:  HOME W HOME HEALTH SERVICES      DC Planning Services  CM consult      Choice offered to / List presented to:             Status of service:  Completed, signed off Medicare Important Message given?   (If response is "NO", the following Medicare IM given date fields will be blank) Date Medicare IM given:   Date Additional Medicare IM given:    Discharge Disposition:    Per UR Regulation:    If discussed at Long Length of Stay Meetings, dates discussed:    Comments:  PCP:  Dr Tori Milks  07/17/2011 11:00 am Darlyne Russian RN, CCM  [phone] call to [facility] regarding home health referral for PT  07/16/2011  2:00 pm Darlyne Russian RN, [place] [phone]  Met with patient and spouse. She is the primary caregiver at home and currently no home health care services. She noted in the past had [facility] for RN. DME: hospital bed, wheelchair, power wheelchair, lift chair. She gives the patient a bed bath, not in the shower.  CM to continue to follow for discharge planning needs.

## 2011-07-17 NOTE — Progress Notes (Signed)
Pt d/c home by car with family. Assessment stable.
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
## 2011-07-17 NOTE — Progress Notes (Signed)
Does patient have a diagnosis of diabetes?  Patient has been receiving Lantus insulin and correction since 07/15/2011.  Will this be continued at home?  Per Med Rec patient was not taking any medications for diabetes prior to admission.

## 2011-07-17 NOTE — Progress Notes (Addendum)
For internal medicine progress note for 3/24 please see d/c summary. Progress note was placed and labeled under wrong category.  Penny Pia md

## 2011-07-19 LAB — GLUCOSE, CAPILLARY: Glucose-Capillary: 142 mg/dL — ABNORMAL HIGH (ref 70–99)

## 2011-07-30 IMAGING — CR DG NECK SOFT TISSUE
2 series · 2 of 2 positions shown · non-contrast
Comparison: 10/28/2008.

CLINICAL DATA: 73-year-old male who swallowed query at [DATE] a.m.
and feels like it is stuck in his throat, upper epiglottis.

NECK SOFT TISSUES - 1+ VIEW

[w soft tissue neck * (1 of 2)]
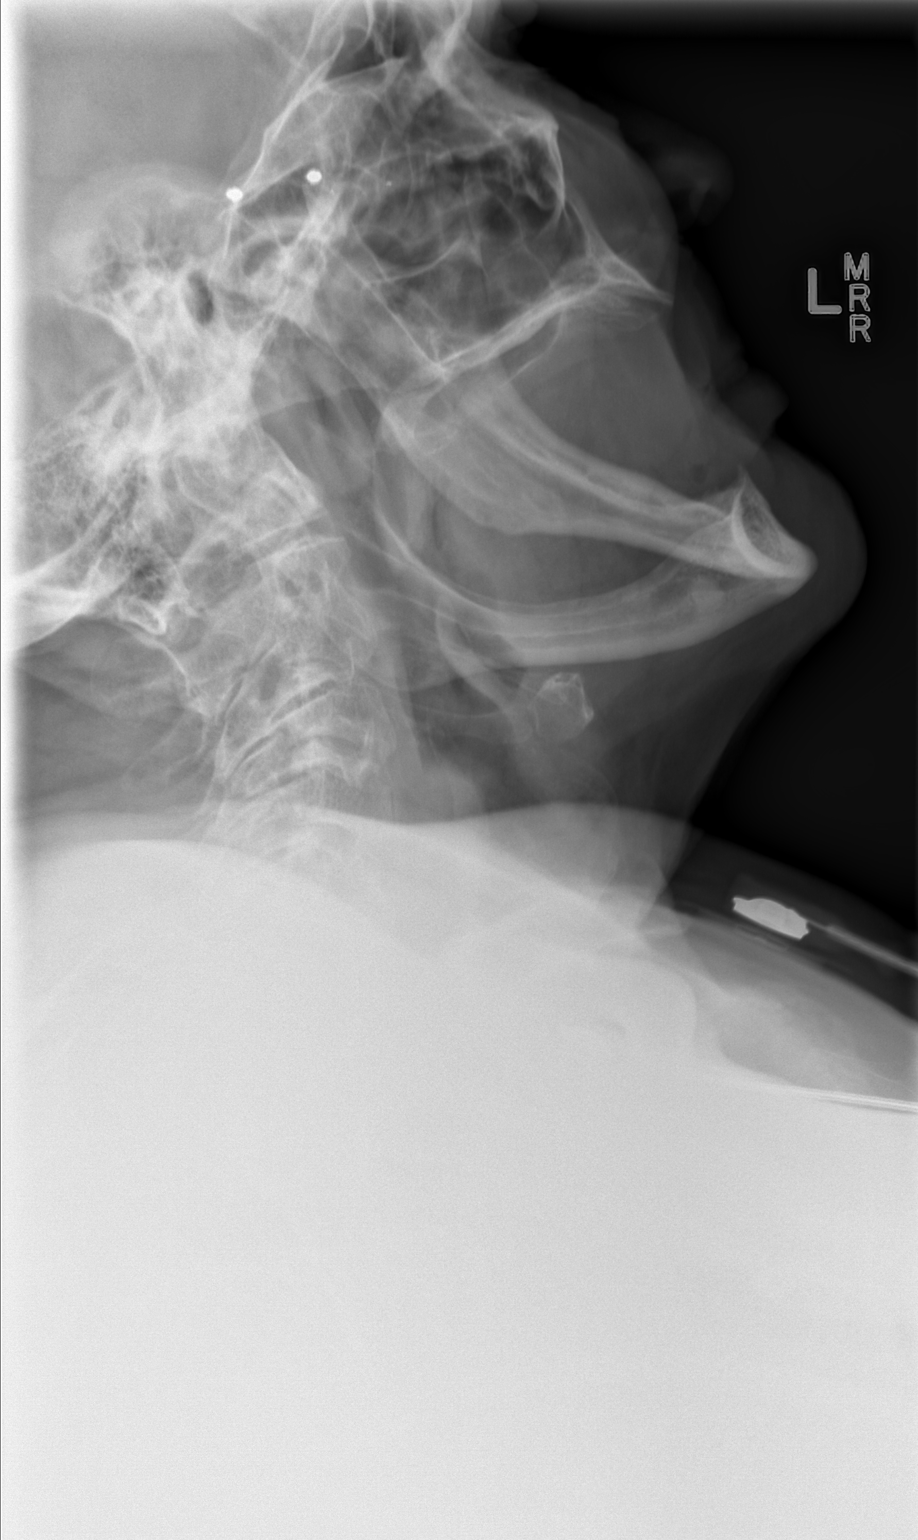

[w soft tissue neck * (2 of 2)]
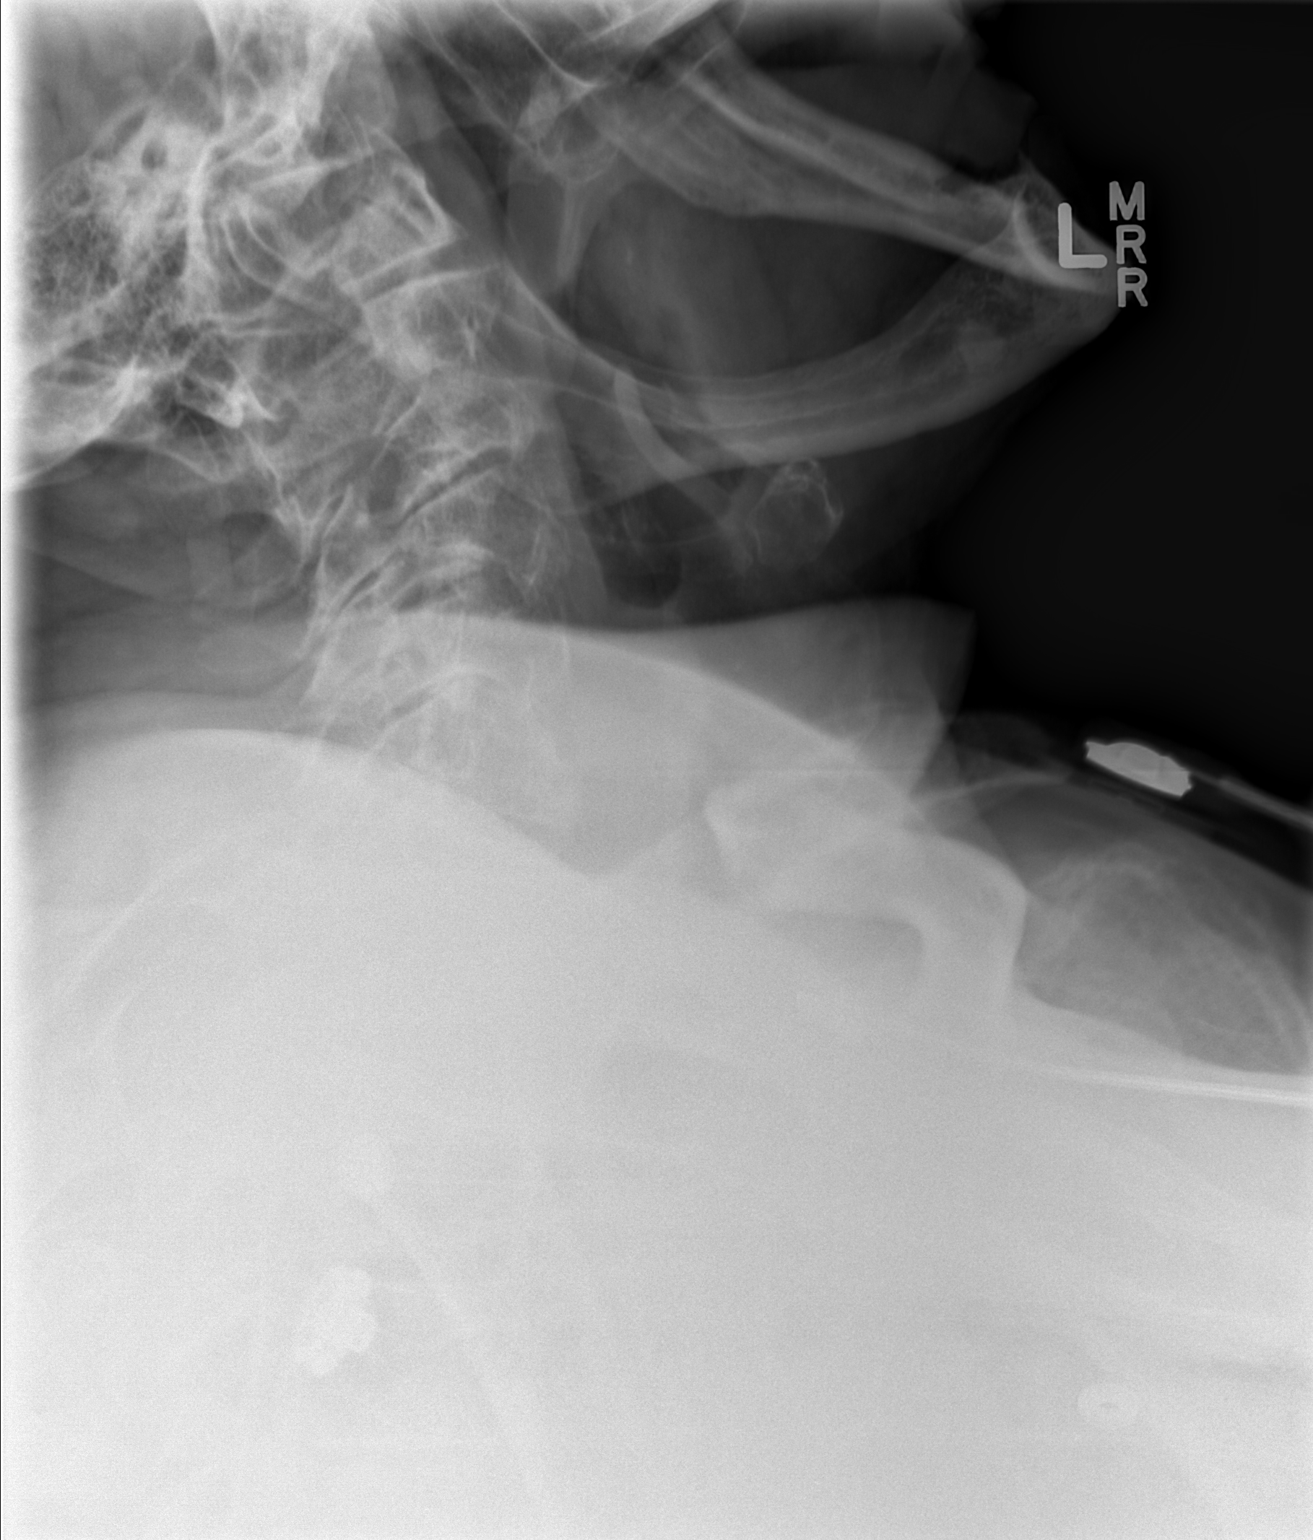

[2 of 2 positions shown; findings below may reference images not displayed]

FINDINGS: Two lateral views of the neck soft tissues are both
somewhat oblique.  Nevertheless, epiglottic contour is stable and
within normal limits.  The vallecula appears pneumatized.
Pharyngeal soft tissue contours and prevertebral soft tissue spaces
appear within normal limits.

No radiopaque foreign body identified.
IMPRESSION: Two oblique lateral views of the neck, but pharyngeal soft tissue
contours including the epiglottis and vallecula appear within
normal limits.

## 2011-12-18 ENCOUNTER — Other Ambulatory Visit (HOSPITAL_COMMUNITY): Payer: Self-pay | Admitting: Cardiovascular Disease

## 2011-12-18 DIAGNOSIS — I2510 Atherosclerotic heart disease of native coronary artery without angina pectoris: Secondary | ICD-10-CM

## 2011-12-18 DIAGNOSIS — R079 Chest pain, unspecified: Secondary | ICD-10-CM

## 2011-12-23 DIAGNOSIS — Z9289 Personal history of other medical treatment: Secondary | ICD-10-CM

## 2011-12-23 HISTORY — DX: Personal history of other medical treatment: Z92.89

## 2011-12-27 ENCOUNTER — Ambulatory Visit (HOSPITAL_COMMUNITY)
Admission: RE | Admit: 2011-12-27 | Discharge: 2011-12-27 | Disposition: A | Discharge status: Home / Self Care | Payer: Medicare Other | Source: Ambulatory Visit | Attending: Cardiovascular Disease | Admitting: Cardiovascular Disease

## 2011-12-27 ENCOUNTER — Other Ambulatory Visit: Payer: Self-pay

## 2011-12-27 ENCOUNTER — Encounter (HOSPITAL_COMMUNITY)
Admission: RE | Admit: 2011-12-27 | Discharge: 2011-12-27 | Disposition: A | Discharge status: Home / Self Care | Payer: Medicare Other | Source: Ambulatory Visit | Attending: Cardiovascular Disease | Admitting: Cardiovascular Disease

## 2011-12-27 DIAGNOSIS — I2510 Atherosclerotic heart disease of native coronary artery without angina pectoris: Secondary | ICD-10-CM

## 2011-12-27 DIAGNOSIS — R079 Chest pain, unspecified: Secondary | ICD-10-CM | POA: Insufficient documentation

## 2011-12-27 DIAGNOSIS — R0989 Other specified symptoms and signs involving the circulatory and respiratory systems: Secondary | ICD-10-CM

## 2011-12-27 DIAGNOSIS — I6529 Occlusion and stenosis of unspecified carotid artery: Secondary | ICD-10-CM | POA: Insufficient documentation

## 2011-12-27 MED ORDER — REGADENOSON 0.4 MG/5ML IV SOLN
0.4000 mg | Freq: Once | INTRAVENOUS | Status: AC
  Administered 2011-12-27: 0.4 mg via INTRAVENOUS

## 2011-12-27 MED ORDER — TECHNETIUM TC 99M TETROFOSMIN IV KIT
10.0000 | PACK | Freq: Once | INTRAVENOUS | Status: AC | PRN
  Administered 2011-12-27: 10 via INTRAVENOUS

## 2011-12-27 MED ORDER — REGADENOSON 0.4 MG/5ML IV SOLN
INTRAVENOUS | Status: AC
  Administered 2011-12-27: 0.4 mg via INTRAVENOUS
  Filled 2011-12-27: qty 5

## 2011-12-27 MED ORDER — TECHNETIUM TC 99M TETROFOSMIN IV KIT
30.0000 | PACK | Freq: Once | INTRAVENOUS | Status: AC | PRN
  Administered 2011-12-27: 30 via INTRAVENOUS

## 2011-12-27 NOTE — Progress Notes (Signed)
*  PRELIMINARY RESULTS* Vascular Ultrasound Carotid Duplex (Doppler) has been completed.  The right internal carotid artery appears to have minimal trickle flow, suggestive of near occlusion. The left internal carotid artery demonstrates elevated velocities in the upper 40-59% range of stenosis. Bilateral antegrade vertebral artery flow.  12/27/2011 4:25 PM Gertie Fey, RDMS, RDCS

## 2012-08-06 ENCOUNTER — Other Ambulatory Visit (HOSPITAL_COMMUNITY): Payer: Self-pay | Admitting: Cardiovascular Disease

## 2012-08-06 DIAGNOSIS — R0602 Shortness of breath: Secondary | ICD-10-CM

## 2012-08-06 DIAGNOSIS — I509 Heart failure, unspecified: Secondary | ICD-10-CM

## 2012-08-06 DIAGNOSIS — I25810 Atherosclerosis of coronary artery bypass graft(s) without angina pectoris: Secondary | ICD-10-CM

## 2012-08-06 DIAGNOSIS — R011 Cardiac murmur, unspecified: Secondary | ICD-10-CM

## 2012-08-11 ENCOUNTER — Ambulatory Visit (HOSPITAL_COMMUNITY)
Admission: RE | Admit: 2012-08-11 | Discharge: 2012-08-11 | Disposition: A | Discharge status: Home / Self Care | Payer: Medicare Other | Source: Ambulatory Visit | Attending: Cardiovascular Disease | Admitting: Cardiovascular Disease

## 2012-08-11 DIAGNOSIS — R0602 Shortness of breath: Secondary | ICD-10-CM | POA: Insufficient documentation

## 2012-08-11 DIAGNOSIS — I509 Heart failure, unspecified: Secondary | ICD-10-CM | POA: Insufficient documentation

## 2012-08-11 DIAGNOSIS — I25810 Atherosclerosis of coronary artery bypass graft(s) without angina pectoris: Secondary | ICD-10-CM | POA: Insufficient documentation

## 2012-08-11 DIAGNOSIS — R011 Cardiac murmur, unspecified: Secondary | ICD-10-CM | POA: Insufficient documentation

## 2012-08-11 HISTORY — PX: TRANSTHORACIC ECHOCARDIOGRAM: SHX275

## 2012-08-11 NOTE — Progress Notes (Signed)
2D Echo Performed 08/11/2012    Camil Hausmann, RCS  

## 2012-09-26 ENCOUNTER — Emergency Department (HOSPITAL_COMMUNITY): Payer: Medicare Other

## 2012-09-26 ENCOUNTER — Encounter (HOSPITAL_COMMUNITY): Payer: Self-pay

## 2012-09-26 ENCOUNTER — Emergency Department (HOSPITAL_COMMUNITY)
Admission: EM | Admit: 2012-09-26 | Discharge: 2012-09-27 | Disposition: A | Discharge status: Home / Self Care | Payer: Medicare Other | Attending: Emergency Medicine | Admitting: Emergency Medicine

## 2012-09-26 DIAGNOSIS — Z7982 Long term (current) use of aspirin: Secondary | ICD-10-CM | POA: Insufficient documentation

## 2012-09-26 DIAGNOSIS — H5440 Blindness, one eye, unspecified eye: Secondary | ICD-10-CM | POA: Insufficient documentation

## 2012-09-26 DIAGNOSIS — R112 Nausea with vomiting, unspecified: Secondary | ICD-10-CM | POA: Insufficient documentation

## 2012-09-26 DIAGNOSIS — K567 Ileus, unspecified: Secondary | ICD-10-CM

## 2012-09-26 DIAGNOSIS — Z79899 Other long term (current) drug therapy: Secondary | ICD-10-CM | POA: Insufficient documentation

## 2012-09-26 DIAGNOSIS — Z87891 Personal history of nicotine dependence: Secondary | ICD-10-CM | POA: Insufficient documentation

## 2012-09-26 DIAGNOSIS — Z8673 Personal history of transient ischemic attack (TIA), and cerebral infarction without residual deficits: Secondary | ICD-10-CM | POA: Insufficient documentation

## 2012-09-26 DIAGNOSIS — K560 Paralytic ileus: Secondary | ICD-10-CM | POA: Insufficient documentation

## 2012-09-26 DIAGNOSIS — Z880 Allergy status to penicillin: Secondary | ICD-10-CM | POA: Insufficient documentation

## 2012-09-26 LAB — CBC WITH DIFFERENTIAL/PLATELET
Basophils Absolute: 0 10*3/uL (ref 0.0–0.1)
Eosinophils Absolute: 0.1 10*3/uL (ref 0.0–0.7)
Eosinophils Relative: 1 % (ref 0–5)
MCH: 29.7 pg (ref 26.0–34.0)
MCHC: 33.7 g/dL (ref 30.0–36.0)
MCV: 88.2 fL (ref 78.0–100.0)
Monocytes Absolute: 1.1 10*3/uL — ABNORMAL HIGH (ref 0.1–1.0)
Platelets: 226 10*3/uL (ref 150–400)
RDW: 12.9 % (ref 11.5–15.5)

## 2012-09-26 LAB — COMPREHENSIVE METABOLIC PANEL
ALT: 22 U/L (ref 0–53)
AST: 39 U/L — ABNORMAL HIGH (ref 0–37)
Albumin: 3.2 g/dL — ABNORMAL LOW (ref 3.5–5.2)
Alkaline Phosphatase: 101 U/L (ref 39–117)
BUN: 11 mg/dL (ref 6–23)
Chloride: 96 mEq/L (ref 96–112)
Potassium: 4.5 mEq/L (ref 3.5–5.1)
Sodium: 135 mEq/L (ref 135–145)
Total Bilirubin: 0.6 mg/dL (ref 0.3–1.2)

## 2012-09-26 LAB — POCT I-STAT TROPONIN I

## 2012-09-26 LAB — LIPASE, BLOOD: Lipase: 7 U/L — ABNORMAL LOW (ref 11–59)

## 2012-09-26 MED ORDER — ONDANSETRON HCL 4 MG/2ML IJ SOLN
4.0000 mg | Freq: Once | INTRAMUSCULAR | Status: AC
  Administered 2012-09-26: 4 mg via INTRAVENOUS
  Filled 2012-09-26: qty 2

## 2012-09-26 NOTE — ED Notes (Signed)
EKG given to EDP,Lockwood,MD.

## 2012-09-26 NOTE — ED Provider Notes (Signed)
History     CSN: [id number]  Arrival date & time 09/26/12  2120   First MD Initiated Contact with Patient 09/26/12 2151      Chief Complaint  Patient presents with  . Emesis  . Nausea   HPI  History provided by the patient and family. Patient is a 77 year old male with history of hypertension, hypercholesterolemia, diabetes, CVA with left eye blindness who presents with abdominal pains and discomfort and episodes of nausea vomiting. Patient has had some nausea and vomiting symptoms for the past one to 2 days. Systems worsened this afternoon with significantly worse nausea vomiting. He reports decreased appetite and by mouth intake. He denies having any diarrhea or constipation symptoms. He's had some abdominal discomfort and nausea and vomiting. Currently he reports feeling improved after vomiting. He denies any significant abdominal pain or discomfort. Denies any associated fever, chills or sweats. No chest pain shortness of breath or heart palpitations. He has not used any medications for symptoms. No other aggravating or alleviating factors. No other associated symptoms.    Past Medical History  Diagnosis Date  . Stroke   . Blind left eye     Past Surgical History  Procedure Laterality Date  . Replacement total knee    . Back surgery      No family history on file.  History  Substance Use Topics  . Smoking status: Former Games developer  . Smokeless tobacco: Not on file  . Alcohol Use: No      Review of Systems  Constitutional: Negative for fever, chills and diaphoresis.  Respiratory: Negative for shortness of breath.   Cardiovascular: Negative for chest pain.  Gastrointestinal: Positive for nausea, vomiting and abdominal pain. Negative for diarrhea and constipation.  All other systems reviewed and are negative.    Allergies  Hydromorphone hcl and Penicillins  Home Medications   Current Outpatient Rx  Name  Route  Sig  Dispense  Refill  . amitriptyline (ELAVIL) 25  MG tablet   Oral   Take 25 mg by mouth at bedtime.         [name] aspirin EC 81 MG tablet   Oral   Take 81 mg by mouth every morning.          [name] atorvastatin (LIPITOR) 40 MG tablet   Oral   Take 40 mg by mouth every morning.          . clopidogrel (PLAVIX) 75 MG tablet   Oral   Take 75 mg by mouth every morning.          . furosemide (LASIX) 20 MG tablet   Oral   Take 20 mg by mouth every morning.          [name] levothyroxine (SYNTHROID, LEVOTHROID) 150 MCG tablet   Oral   Take 150 mcg by mouth every morning.          . Multiple Vitamin (MULITIVITAMIN WITH MINERALS) TABS   Oral   Take 1 tablet by mouth every morning.          [name] oxyCODONE-acetaminophen (PERCOCET) 10-325 MG per tablet   Oral   Take 1 tablet by mouth every 8 (eight) hours as needed for pain.         . potassium chloride SA (K-DUR,KLOR-CON) 20 MEQ tablet   Oral   Take 20 mEq by mouth every morning.         . Venlafaxine HCl (EFFEXOR XR PO)   Oral   Take 1 capsule by  mouth every morning.           BP 130/79  Pulse 116  Temp(Src) 98.8 F (37.1 C) (Oral)  Resp 20  SpO2 95%  Physical Exam  Nursing note and vitals reviewed. Constitutional: He is oriented to person, place, and time. He appears well-developed and well-nourished. No distress.  HENT:  Head: Normocephalic.  Cardiovascular: Normal rate and regular rhythm.   Pulmonary/Chest: Effort normal and breath sounds normal. No respiratory distress. He has no wheezes. He has no rales.  Abdominal: Soft. Bowel sounds are normal. He exhibits no distension. There is no tenderness. There is no rebound and no guarding.  Old midline surgical scar consistent with history of prior surgeries. No hernias present.  Musculoskeletal:  Joint effusion left knee with very mild tenderness. Old surgical scars.  Neurological: He is alert and oriented to person, place, and time.  Skin: Skin is warm.  Psychiatric: He has a normal mood and affect. His behavior  is normal.    ED Course  Procedures   Results for orders placed during the hospital encounter of 09/26/12  LIPASE, BLOOD      Result Value Range   Lipase 7 (*) 11 - 59 U/L  COMPREHENSIVE METABOLIC PANEL      Result Value Range   Sodium 135  135 - 145 mEq/L   Potassium 4.5  3.5 - 5.1 mEq/L   Chloride 96  96 - 112 mEq/L   CO2 26  19 - 32 mEq/L   Glucose, Bld 261 (*) 70 - 99 mg/dL   BUN 11  6 - 23 mg/dL   Creatinine, Ser 1.91  0.50 - 1.35 mg/dL   Calcium 9.4  8.4 - 47.8 mg/dL   Total Protein 7.3  6.0 - 8.3 g/dL   Albumin 3.2 (*) 3.5 - 5.2 g/dL   AST 39 (*) 0 - 37 U/L   ALT 22  0 - 53 U/L   Alkaline Phosphatase 101  39 - 117 U/L   Total Bilirubin 0.6  0.3 - 1.2 mg/dL   GFR calc non Af Amer >90  >90 mL/min   GFR calc Af Amer >90  >90 mL/min  CBC WITH DIFFERENTIAL      Result Value Range   WBC 13.7 (*) 4.0 - 10.5 K/uL   RBC 4.75  4.22 - 5.81 MIL/uL   Hemoglobin 14.1  13.0 - 17.0 g/dL   HCT 29.5  62.1 - 30.8 %   MCV 88.2  78.0 - 100.0 fL   MCH 29.7  26.0 - 34.0 pg   MCHC 33.7  30.0 - 36.0 g/dL   RDW 65.7  84.6 - 96.2 %   Platelets 226  150 - 400 K/uL   Neutrophils Relative % 76  43 - 77 %   Neutro Abs 10.4 (*) 1.7 - 7.7 K/uL   Lymphocytes Relative 16  12 - 46 %   Lymphs Abs 2.1  0.7 - 4.0 K/uL   Monocytes Relative 8  3 - 12 %   Monocytes Absolute 1.1 (*) 0.1 - 1.0 K/uL   Eosinophils Relative 1  0 - 5 %   Eosinophils Absolute 0.1  0.0 - 0.7 K/uL   Basophils Relative 0  0 - 1 %   Basophils Absolute 0.0  0.0 - 0.1 K/uL  POCT I-STAT TROPONIN I      Result Value Range   Troponin i, poc 0.02  0.00 - 0.08 ng/mL   Comment 3  Dg Abd Acute W/chest  09/26/2012   *RADIOLOGY REPORT*  Clinical Data: Abdominal pain and vomiting.  ACUTE ABDOMEN SERIES (ABDOMEN 2 VIEW & CHEST 1 VIEW)  Comparison: Chest radiograph performed 07/14/2011, and chest and abdominal radiographs performed 05/24/2008  Findings: The lungs are well-aerated.  Mild right perihilar and left basilar  opacity may reflect atelectasis or possibly mild pneumonia.  There is no evidence of pleural effusion or pneumothorax.  The cardiomediastinal silhouette is borderline normal in size; calcification is noted within the aortic arch.  The visualized bowel gas pattern is nonspecific.  There is slight apparent distension of small and large bowel loops, which could reflect mild ileus; a few air-fluid levels are seen.  There is no evidence of focal small bowel dilatation to suggest obstruction. No free intra-abdominal air is identified on the provided decubitus view.  No acute osseous abnormalities are seen; the sacroiliac joints are unremarkable in appearance.  An aortoiliac stent is noted; changes of vertebroplasty are seen along the lower lumbar spine.  The patients left hip prosthesis is unremarkable in appearance, though incompletely imaged.  IMPRESSION:  1.  Slight apparent distension of small and large bowel loops may reflect mild ileus; no evidence of bowel obstruction.  No free intra-abdominal air seen. 2.  Mild right perihilar and left basilar airspace opacities may reflect atelectasis or possibly mild pneumonia.   Original Report Authenticated By: Tonia Ghent, M.D.     1. Nausea & vomiting   2. Ileus       MDM  9:50PM patient seen and evaluated. Patient currently without any significant complaints. States he feels improved after episodes of vomiting. Does not appear in any acute distress.  Patient continues to have no complaints. Lab testing and x-ray without any concerning emergent findings. There are slight signs of mild ileus. This may be resolving given his soft abdomen exam and lack of symptoms at this time. Patient was seen and evaluated with attending physician. Patient has normal heart rate at rest. He did O2 sats. No respiratory complaints. At This time he requests to return home and is felt stable for return home. Strict return precautions provided.     Date: 09/26/2012  Rate: 89   Rhythm: normal sinus rhythm and premature atrial contractions (PAC) and  QRS Axis: normal  Intervals: normal  ST/T Wave abnormalities: nonspecific ST/T changes  Conduction Disutrbances:left bundle branch block  Narrative Interpretation:   Old EKG Reviewed: No significant changes          Angus Seller, PA-C 09/27/12 0004

## 2012-09-26 NOTE — ED Notes (Addendum)
Pt presents with c/o nausea and vomiting that began around 1pm today. Pt's wife says that he has not had anything to eat all day today and has vomited approx 6 times today. Pt has a hx of bowel obstruction and wife says that these symptoms were similar to those.

## 2012-09-26 NOTE — ED Notes (Signed)
ZOX:[id number]<AV> Expected date:<BR> Expected time:<BR> Means of arrival:<BR> Comments:<BR> Triage 4

## 2012-09-27 NOTE — ED Provider Notes (Signed)
  Medical screening examination/treatment/procedure(s) were performed by non-physician practitioner and as supervising physician I was immediately available for consultation/collaboration.  On my exam the patient was in no distress.  He stated he felt substantially better, requested discharge.  The patient's evaluation suggests a possible ileus, his request for discharge, his capacity to return, the absence of any distress, or any current complaints is reassuring.  I saw the ECG (if appropriate), relevant labs and studies - I agree with the interpretation.    Gerhard Munch, MD 09/27/12 1850

## 2012-12-05 ENCOUNTER — Other Ambulatory Visit: Payer: Self-pay | Admitting: Cardiovascular Disease

## 2012-12-05 LAB — CBC WITH DIFFERENTIAL/PLATELET
Basophils Absolute: 0.1 10*3/uL (ref 0.0–0.1)
HCT: 44.1 % (ref 39.0–52.0)
Hemoglobin: 14.9 g/dL (ref 13.0–17.0)
Lymphocytes Relative: 28 % (ref 12–46)
Monocytes Absolute: 0.6 10*3/uL (ref 0.1–1.0)
Monocytes Relative: 5 % (ref 3–12)
Neutro Abs: 8 10*3/uL — ABNORMAL HIGH (ref 1.7–7.7)
WBC: 12.1 10*3/uL — ABNORMAL HIGH (ref 4.0–10.5)

## 2012-12-06 LAB — COMPREHENSIVE METABOLIC PANEL
AST: 31 U/L (ref 0–37)
Albumin: 4 g/dL (ref 3.5–5.2)
BUN: 13 mg/dL (ref 6–23)
Calcium: 9.7 mg/dL (ref 8.4–10.5)
Chloride: 100 mEq/L (ref 96–112)
Potassium: 4.6 mEq/L (ref 3.5–5.3)
Sodium: 137 mEq/L (ref 135–145)
Total Protein: 7.8 g/dL (ref 6.0–8.3)

## 2012-12-07 HISTORY — PX: OTHER SURGICAL HISTORY: SHX169

## 2012-12-11 ENCOUNTER — Ambulatory Visit (INDEPENDENT_AMBULATORY_CARE_PROVIDER_SITE_OTHER): Payer: Medicare Other | Admitting: Pharmacist Clinician (PhC)/ Clinical Pharmacy Specialist

## 2012-12-11 DIAGNOSIS — Z7901 Long term (current) use of anticoagulants: Secondary | ICD-10-CM

## 2012-12-11 DIAGNOSIS — I4891 Unspecified atrial fibrillation: Secondary | ICD-10-CM

## 2012-12-17 ENCOUNTER — Encounter: Payer: Self-pay | Admitting: Cardiovascular Disease

## 2012-12-17 ENCOUNTER — Ambulatory Visit (INDEPENDENT_AMBULATORY_CARE_PROVIDER_SITE_OTHER): Payer: Medicare Other | Admitting: Pharmacist Clinician (PhC)/ Clinical Pharmacy Specialist

## 2012-12-17 DIAGNOSIS — I4891 Unspecified atrial fibrillation: Secondary | ICD-10-CM

## 2012-12-17 DIAGNOSIS — Z7901 Long term (current) use of anticoagulants: Secondary | ICD-10-CM

## 2012-12-17 LAB — POCT INR: INR: 2.5

## 2012-12-26 ENCOUNTER — Ambulatory Visit (INDEPENDENT_AMBULATORY_CARE_PROVIDER_SITE_OTHER): Payer: Medicare Other | Admitting: Pharmacist Clinician (PhC)/ Clinical Pharmacy Specialist

## 2012-12-26 DIAGNOSIS — I4891 Unspecified atrial fibrillation: Secondary | ICD-10-CM

## 2012-12-26 DIAGNOSIS — Z7901 Long term (current) use of anticoagulants: Secondary | ICD-10-CM

## 2012-12-31 ENCOUNTER — Ambulatory Visit (INDEPENDENT_AMBULATORY_CARE_PROVIDER_SITE_OTHER): Payer: Medicare Other | Admitting: Pharmacist Clinician (PhC)/ Clinical Pharmacy Specialist

## 2012-12-31 ENCOUNTER — Other Ambulatory Visit: Payer: Self-pay | Admitting: *Deleted

## 2012-12-31 DIAGNOSIS — I4891 Unspecified atrial fibrillation: Secondary | ICD-10-CM

## 2012-12-31 DIAGNOSIS — Z7901 Long term (current) use of anticoagulants: Secondary | ICD-10-CM

## 2012-12-31 DIAGNOSIS — R0989 Other specified symptoms and signs involving the circulatory and respiratory systems: Secondary | ICD-10-CM

## 2012-12-31 DIAGNOSIS — R52 Pain, unspecified: Secondary | ICD-10-CM

## 2012-12-31 DIAGNOSIS — L8990 Pressure ulcer of unspecified site, unspecified stage: Secondary | ICD-10-CM

## 2012-12-31 LAB — POCT INR: INR: 2.5

## 2013-01-05 ENCOUNTER — Telehealth: Payer: Self-pay | Admitting: Cardiovascular Disease

## 2013-01-07 ENCOUNTER — Ambulatory Visit (HOSPITAL_COMMUNITY)
Admission: RE | Admit: 2013-01-07 | Discharge: 2013-01-07 | Disposition: A | Discharge status: Home / Self Care | Payer: Medicare Other | Source: Ambulatory Visit | Attending: Cardiovascular Disease | Admitting: Cardiovascular Disease

## 2013-01-07 DIAGNOSIS — R0989 Other specified symptoms and signs involving the circulatory and respiratory systems: Secondary | ICD-10-CM

## 2013-01-07 NOTE — Progress Notes (Signed)
Carotid Duplex Completed. Sara Selvidge, BS, RDMS, RVT  

## 2013-01-12 ENCOUNTER — Encounter (HOSPITAL_BASED_OUTPATIENT_CLINIC_OR_DEPARTMENT_OTHER): Payer: Medicare Other | Attending: General Surgery

## 2013-01-12 ENCOUNTER — Other Ambulatory Visit (HOSPITAL_BASED_OUTPATIENT_CLINIC_OR_DEPARTMENT_OTHER): Payer: Self-pay | Admitting: General Surgery

## 2013-01-12 ENCOUNTER — Ambulatory Visit (HOSPITAL_COMMUNITY)
Admission: RE | Admit: 2013-01-12 | Discharge: 2013-01-12 | Disposition: A | Discharge status: Home / Self Care | Payer: Medicare Other | Source: Ambulatory Visit | Attending: General Surgery | Admitting: General Surgery

## 2013-01-12 DIAGNOSIS — L97409 Non-pressure chronic ulcer of unspecified heel and midfoot with unspecified severity: Secondary | ICD-10-CM | POA: Insufficient documentation

## 2013-01-12 DIAGNOSIS — J4489 Other specified chronic obstructive pulmonary disease: Secondary | ICD-10-CM | POA: Insufficient documentation

## 2013-01-12 DIAGNOSIS — M7989 Other specified soft tissue disorders: Secondary | ICD-10-CM | POA: Insufficient documentation

## 2013-01-12 DIAGNOSIS — S8990XA Unspecified injury of unspecified lower leg, initial encounter: Secondary | ICD-10-CM | POA: Insufficient documentation

## 2013-01-12 DIAGNOSIS — Z79899 Other long term (current) drug therapy: Secondary | ICD-10-CM | POA: Insufficient documentation

## 2013-01-12 DIAGNOSIS — M869 Osteomyelitis, unspecified: Secondary | ICD-10-CM

## 2013-01-12 DIAGNOSIS — M899 Disorder of bone, unspecified: Secondary | ICD-10-CM | POA: Insufficient documentation

## 2013-01-12 DIAGNOSIS — Z7982 Long term (current) use of aspirin: Secondary | ICD-10-CM | POA: Insufficient documentation

## 2013-01-12 DIAGNOSIS — Z96659 Presence of unspecified artificial knee joint: Secondary | ICD-10-CM | POA: Insufficient documentation

## 2013-01-12 DIAGNOSIS — I739 Peripheral vascular disease, unspecified: Secondary | ICD-10-CM | POA: Insufficient documentation

## 2013-01-12 DIAGNOSIS — Z8673 Personal history of transient ischemic attack (TIA), and cerebral infarction without residual deficits: Secondary | ICD-10-CM | POA: Insufficient documentation

## 2013-01-12 DIAGNOSIS — L97509 Non-pressure chronic ulcer of other part of unspecified foot with unspecified severity: Secondary | ICD-10-CM | POA: Insufficient documentation

## 2013-01-12 DIAGNOSIS — E1169 Type 2 diabetes mellitus with other specified complication: Secondary | ICD-10-CM | POA: Insufficient documentation

## 2013-01-12 DIAGNOSIS — E119 Type 2 diabetes mellitus without complications: Secondary | ICD-10-CM | POA: Insufficient documentation

## 2013-01-12 DIAGNOSIS — S98139A Complete traumatic amputation of one unspecified lesser toe, initial encounter: Secondary | ICD-10-CM | POA: Insufficient documentation

## 2013-01-12 DIAGNOSIS — M216X9 Other acquired deformities of unspecified foot: Secondary | ICD-10-CM | POA: Insufficient documentation

## 2013-01-12 DIAGNOSIS — J449 Chronic obstructive pulmonary disease, unspecified: Secondary | ICD-10-CM | POA: Insufficient documentation

## 2013-01-12 DIAGNOSIS — Z7901 Long term (current) use of anticoagulants: Secondary | ICD-10-CM | POA: Insufficient documentation

## 2013-01-12 DIAGNOSIS — I10 Essential (primary) hypertension: Secondary | ICD-10-CM | POA: Insufficient documentation

## 2013-01-12 DIAGNOSIS — X58XXXA Exposure to other specified factors, initial encounter: Secondary | ICD-10-CM | POA: Insufficient documentation

## 2013-01-13 NOTE — H&P (Signed)
NAME:  [name], [name] NO.:  [id number]  MEDICAL RECORD NO.:  [id number]  LOCATION:  FOOT                         FACILITY:  [facility]  PHYSICIAN:  Joanne Gavel, M.D.        DATE OF BIRTH:  [birth year]  DATE OF ADMISSION:  01/12/2013 DATE OF DISCHARGE:                             HISTORY & PHYSICAL   CHIEF COMPLAINT:  Sores both feet.  HISTORY OF PRESENT ILLNESS:  This is a 77 year old diabetic male with a history of diabetes mellitus, hypertension, and a severe left-sided stroke several years ago.  He also has COPD.  He developed wounds of both heels over the course of several weeks, and he has a sore at site of previous left fifth toe amputation.  No cigarettes for 15 years. Alcohol occasionally.  MEDICATIONS:  Elavil, aspirin, Lipitor, Plavix, Lasix, Synthroid, Percocet, Effexor, Coumadin, and metoprolol.  ALLERGIES:  Dilaudid and penicillin, causes hives.  PAST SURGICAL HISTORY:  Total knee replacement right, back surgery, left fifth toe amputation, right fifth and third toe amputations.  REVIEW OF SYSTEMS:  As above.  PHYSICAL EXAMINATION:  VITAL SIGNS:  Temperature 98.5, pulse 63, respirations 18, blood pressure 115/64.  Glucose is 95. GENERAL APPEARANCE:  The patient has flattening of the left nasolabial fold.  He has anesthetic and essentially paralyzed on the left side. Right side has normal strength. CHEST:  Clear. HEART:  Regular rhythm. EXTREMITIES:  Peripheral pulses are not palpable.  There is a complete footdrop on the left side.  The left heel is a 2.0 x 1.5 x 0.4 ulceration.  On the left lateral foot there is 1.0 x 0.5 x 0.3, and on the right lateral foot near the heel there is a 1.4 x 2.0 x 0.1 wound.  IMPRESSION:  Multiple diabetic foot ulcers bilaterally complicated by severe peripheral vascular disease and elements of pressure.  PLAN:  Right now, we will dress the wounds with silver alginate.  I have debrided them fairly vigorously  including subcutaneous tissue.  The bone at the left heel was probably exposed or will be exposed very soon. Several other areas of bone are palpable, but covered.  The patient is to have vascular studies done in 3 days.  I have spoken to his wife about the possibility that the amputation will be indicated, but we will see him in 7 days.     Joanne Gavel, M.D.     RA/MEDQ  D:  01/12/2013  T:  01/13/2013  Job:  [id number]

## 2013-01-15 ENCOUNTER — Ambulatory Visit (HOSPITAL_COMMUNITY)
Admission: RE | Admit: 2013-01-15 | Discharge: 2013-01-15 | Disposition: A | Discharge status: Home / Self Care | Payer: Medicare Other | Source: Ambulatory Visit | Attending: Cardiovascular Disease | Admitting: Cardiovascular Disease

## 2013-01-15 DIAGNOSIS — R52 Pain, unspecified: Secondary | ICD-10-CM | POA: Insufficient documentation

## 2013-01-15 HISTORY — PX: OTHER SURGICAL HISTORY: SHX169

## 2013-01-15 NOTE — Progress Notes (Signed)
Lower Extremity Arterial Duplex Completed. °Brianna L Mazza,RVT °

## 2013-01-20 ENCOUNTER — Encounter (HOSPITAL_COMMUNITY): Payer: Self-pay | Admitting: Emergency Medicine

## 2013-01-20 ENCOUNTER — Inpatient Hospital Stay (HOSPITAL_COMMUNITY)
Admission: EM | Admit: 2013-01-20 | Discharge: 2013-01-22 | DRG: 300 | Disposition: A | Discharge status: Home Health | Payer: Medicare Other | Attending: Internal Medicine | Admitting: Internal Medicine

## 2013-01-20 DIAGNOSIS — E7800 Pure hypercholesterolemia, unspecified: Secondary | ICD-10-CM | POA: Diagnosis present

## 2013-01-20 DIAGNOSIS — E039 Hypothyroidism, unspecified: Secondary | ICD-10-CM | POA: Diagnosis present

## 2013-01-20 DIAGNOSIS — Z96659 Presence of unspecified artificial knee joint: Secondary | ICD-10-CM

## 2013-01-20 DIAGNOSIS — Z7901 Long term (current) use of anticoagulants: Secondary | ICD-10-CM

## 2013-01-20 DIAGNOSIS — IMO0002 Reserved for concepts with insufficient information to code with codable children: Secondary | ICD-10-CM | POA: Diagnosis present

## 2013-01-20 DIAGNOSIS — F32A Depression, unspecified: Secondary | ICD-10-CM | POA: Diagnosis present

## 2013-01-20 DIAGNOSIS — F329 Major depressive disorder, single episode, unspecified: Secondary | ICD-10-CM

## 2013-01-20 DIAGNOSIS — I6350 Cerebral infarction due to unspecified occlusion or stenosis of unspecified cerebral artery: Secondary | ICD-10-CM

## 2013-01-20 DIAGNOSIS — H543 Unqualified visual loss, both eyes: Secondary | ICD-10-CM | POA: Diagnosis present

## 2013-01-20 DIAGNOSIS — I69959 Hemiplegia and hemiparesis following unspecified cerebrovascular disease affecting unspecified side: Secondary | ICD-10-CM

## 2013-01-20 DIAGNOSIS — I798 Other disorders of arteries, arterioles and capillaries in diseases classified elsewhere: Secondary | ICD-10-CM | POA: Diagnosis present

## 2013-01-20 DIAGNOSIS — E876 Hypokalemia: Secondary | ICD-10-CM | POA: Diagnosis present

## 2013-01-20 DIAGNOSIS — R58 Hemorrhage, not elsewhere classified: Secondary | ICD-10-CM | POA: Diagnosis present

## 2013-01-20 DIAGNOSIS — R4781 Slurred speech: Secondary | ICD-10-CM

## 2013-01-20 DIAGNOSIS — E1159 Type 2 diabetes mellitus with other circulatory complications: Principal | ICD-10-CM | POA: Diagnosis present

## 2013-01-20 DIAGNOSIS — Z79899 Other long term (current) drug therapy: Secondary | ICD-10-CM

## 2013-01-20 DIAGNOSIS — I4891 Unspecified atrial fibrillation: Secondary | ICD-10-CM | POA: Diagnosis present

## 2013-01-20 DIAGNOSIS — T45511A Poisoning by anticoagulants, accidental (unintentional), initial encounter: Secondary | ICD-10-CM

## 2013-01-20 DIAGNOSIS — L89609 Pressure ulcer of unspecified heel, unspecified stage: Secondary | ICD-10-CM | POA: Diagnosis present

## 2013-01-20 DIAGNOSIS — E1165 Type 2 diabetes mellitus with hyperglycemia: Secondary | ICD-10-CM | POA: Diagnosis present

## 2013-01-20 DIAGNOSIS — T45515A Adverse effect of anticoagulants, initial encounter: Secondary | ICD-10-CM | POA: Diagnosis present

## 2013-01-20 DIAGNOSIS — L97409 Non-pressure chronic ulcer of unspecified heel and midfoot with unspecified severity: Secondary | ICD-10-CM | POA: Diagnosis present

## 2013-01-20 DIAGNOSIS — K5900 Constipation, unspecified: Secondary | ICD-10-CM

## 2013-01-20 DIAGNOSIS — R791 Abnormal coagulation profile: Secondary | ICD-10-CM | POA: Diagnosis present

## 2013-01-20 DIAGNOSIS — L8990 Pressure ulcer of unspecified site, unspecified stage: Secondary | ICD-10-CM | POA: Diagnosis present

## 2013-01-20 DIAGNOSIS — Z96649 Presence of unspecified artificial hip joint: Secondary | ICD-10-CM

## 2013-01-20 DIAGNOSIS — F3289 Other specified depressive episodes: Secondary | ICD-10-CM | POA: Diagnosis present

## 2013-01-20 DIAGNOSIS — S98139A Complete traumatic amputation of one unspecified lesser toe, initial encounter: Secondary | ICD-10-CM

## 2013-01-20 DIAGNOSIS — D62 Acute posthemorrhagic anemia: Secondary | ICD-10-CM | POA: Diagnosis present

## 2013-01-20 DIAGNOSIS — E1151 Type 2 diabetes mellitus with diabetic peripheral angiopathy without gangrene: Secondary | ICD-10-CM | POA: Diagnosis present

## 2013-01-20 HISTORY — DX: Depression, unspecified: F32.A

## 2013-01-20 HISTORY — DX: Unspecified atrial fibrillation: I48.91

## 2013-01-20 HISTORY — DX: Hypothyroidism, unspecified: E03.9

## 2013-01-20 HISTORY — DX: Pure hypercholesterolemia, unspecified: E78.00

## 2013-01-20 HISTORY — DX: Dry eye syndrome of bilateral lacrimal glands: H04.123

## 2013-01-20 HISTORY — DX: Reserved for concepts with insufficient information to code with codable children: IMO0002

## 2013-01-20 HISTORY — DX: Major depressive disorder, single episode, unspecified: F32.9

## 2013-01-20 HISTORY — DX: Type 2 diabetes mellitus with diabetic peripheral angiopathy without gangrene: E11.51

## 2013-01-20 HISTORY — DX: Peripheral vascular disease, unspecified: I73.9

## 2013-01-20 HISTORY — DX: Type 2 diabetes mellitus with hyperglycemia: E11.65

## 2013-01-20 LAB — CBC WITH DIFFERENTIAL/PLATELET
Basophils Absolute: 0.1 10*3/uL (ref 0.0–0.1)
Basophils Relative: 0 % (ref 0–1)
Hemoglobin: 10.6 g/dL — ABNORMAL LOW (ref 13.0–17.0)
MCHC: 32.5 g/dL (ref 30.0–36.0)
Neutro Abs: 8.8 10*3/uL — ABNORMAL HIGH (ref 1.7–7.7)
Neutrophils Relative %: 70 % (ref 43–77)
Platelets: 327 10*3/uL (ref 150–400)
RDW: 14 % (ref 11.5–15.5)

## 2013-01-20 LAB — ABO/RH: ABO/RH(D): A NEG

## 2013-01-20 LAB — BASIC METABOLIC PANEL
BUN: 11 mg/dL (ref 6–23)
CO2: 25 mEq/L (ref 19–32)
Chloride: 101 mEq/L (ref 96–112)
GFR calc non Af Amer: 89 mL/min — ABNORMAL LOW (ref >90)
Glucose, Bld: 202 mg/dL — ABNORMAL HIGH (ref 70–99)
Potassium: 3.1 mEq/L — ABNORMAL LOW (ref 3.5–5.1)
Sodium: 139 mEq/L (ref 135–145)

## 2013-01-20 LAB — PROTIME-INR
INR: 11.81 (ref 0.00–1.49)
Prothrombin Time: 85.6 seconds — ABNORMAL HIGH (ref 11.6–15.2)

## 2013-01-20 LAB — GLUCOSE, CAPILLARY: Glucose-Capillary: 156 mg/dL — ABNORMAL HIGH (ref 70–99)

## 2013-01-20 MED ORDER — OXYCODONE-ACETAMINOPHEN 5-325 MG PO TABS
1.0000 | ORAL_TABLET | Freq: Three times a day (TID) | ORAL | Status: DC | PRN
  Administered 2013-01-20 – 2013-01-21 (×3): 1 via ORAL
  Filled 2013-01-20 (×3): qty 1

## 2013-01-20 MED ORDER — PHYTONADIONE 5 MG PO TABS
5.0000 mg | ORAL_TABLET | Freq: Once | ORAL | Status: AC
  Administered 2013-01-20: 5 mg via ORAL
  Filled 2013-01-20: qty 1

## 2013-01-20 MED ORDER — ATORVASTATIN CALCIUM 40 MG PO TABS
40.0000 mg | ORAL_TABLET | Freq: Every morning | ORAL | Status: DC
  Administered 2013-01-21 – 2013-01-22 (×2): 40 mg via ORAL
  Filled 2013-01-20 (×2): qty 1

## 2013-01-20 MED ORDER — FUROSEMIDE 20 MG PO TABS
20.0000 mg | ORAL_TABLET | Freq: Every morning | ORAL | Status: DC
  Administered 2013-01-21 – 2013-01-22 (×2): 20 mg via ORAL
  Filled 2013-01-20 (×2): qty 1

## 2013-01-20 MED ORDER — OXYCODONE HCL 5 MG PO TABS
5.0000 mg | ORAL_TABLET | Freq: Three times a day (TID) | ORAL | Status: DC | PRN
  Administered 2013-01-20 – 2013-01-21 (×3): 5 mg via ORAL
  Filled 2013-01-20 (×3): qty 1

## 2013-01-20 MED ORDER — POTASSIUM CHLORIDE CRYS ER 20 MEQ PO TBCR
20.0000 meq | EXTENDED_RELEASE_TABLET | Freq: Every morning | ORAL | Status: DC
  Administered 2013-01-21: 20 meq via ORAL
  Filled 2013-01-20 (×2): qty 1

## 2013-01-20 MED ORDER — ACETAMINOPHEN 650 MG RE SUPP: 650.0000 mg | Freq: Four times a day (QID) | RECTAL | Status: DC | PRN

## 2013-01-20 MED ORDER — AMIODARONE HCL 200 MG PO TABS
200.0000 mg | ORAL_TABLET | Freq: Every day | ORAL | Status: DC
  Administered 2013-01-21 – 2013-01-22 (×2): 200 mg via ORAL
  Filled 2013-01-20 (×2): qty 1

## 2013-01-20 MED ORDER — ONDANSETRON HCL 4 MG PO TABS: 4.0000 mg | ORAL_TABLET | Freq: Four times a day (QID) | ORAL | Status: DC | PRN

## 2013-01-20 MED ORDER — SODIUM CHLORIDE 0.9 % IV SOLN: 250.0000 mL | INTRAVENOUS | Status: DC | PRN

## 2013-01-20 MED ORDER — SODIUM CHLORIDE 0.9 % IJ SOLN
3.0000 mL | Freq: Two times a day (BID) | INTRAMUSCULAR | Status: DC
  Administered 2013-01-20 – 2013-01-21 (×3): 3 mL via INTRAVENOUS

## 2013-01-20 MED ORDER — OXYCODONE-ACETAMINOPHEN 10-325 MG PO TABS: 1.0000 | ORAL_TABLET | Freq: Three times a day (TID) | ORAL | Status: DC | PRN

## 2013-01-20 MED ORDER — INFLUENZA VAC SPLIT QUAD 0.5 ML IM SUSP
0.5000 mL | INTRAMUSCULAR | Status: AC
  Administered 2013-01-21: 0.5 mL via INTRAMUSCULAR
  Filled 2013-01-20 (×2): qty 0.5

## 2013-01-20 MED ORDER — AMITRIPTYLINE HCL 25 MG PO TABS
25.0000 mg | ORAL_TABLET | Freq: Every day | ORAL | Status: DC
  Administered 2013-01-20 – 2013-01-21 (×2): 25 mg via ORAL
  Filled 2013-01-20 (×3): qty 1

## 2013-01-20 MED ORDER — INSULIN ASPART 100 UNIT/ML ~~LOC~~ SOLN
0.0000 [IU] | Freq: Three times a day (TID) | SUBCUTANEOUS | Status: DC
  Administered 2013-01-20 – 2013-01-21 (×2): 2 [IU] via SUBCUTANEOUS
  Administered 2013-01-21: 1 [IU] via SUBCUTANEOUS
  Administered 2013-01-21 – 2013-01-22 (×2): 2 [IU] via SUBCUTANEOUS

## 2013-01-20 MED ORDER — LEVOTHYROXINE SODIUM 150 MCG PO TABS
150.0000 ug | ORAL_TABLET | Freq: Every day | ORAL | Status: DC
  Administered 2013-01-21 – 2013-01-22 (×2): 150 ug via ORAL
  Filled 2013-01-20 (×3): qty 1

## 2013-01-20 MED ORDER — METOPROLOL SUCCINATE 12.5 MG HALF TABLET
12.5000 mg | ORAL_TABLET | Freq: Every day | ORAL | Status: DC
  Administered 2013-01-21 – 2013-01-22 (×2): 12.5 mg via ORAL
  Filled 2013-01-20 (×2): qty 1

## 2013-01-20 MED ORDER — ADULT MULTIVITAMIN W/MINERALS CH
1.0000 | ORAL_TABLET | Freq: Every morning | ORAL | Status: DC
  Administered 2013-01-21 – 2013-01-22 (×2): 1 via ORAL
  Filled 2013-01-20 (×3): qty 1

## 2013-01-20 MED ORDER — SODIUM CHLORIDE 0.9 % IJ SOLN: 3.0000 mL | INTRAMUSCULAR | Status: DC | PRN

## 2013-01-20 MED ORDER — ACETAMINOPHEN 325 MG PO TABS: 650.0000 mg | ORAL_TABLET | Freq: Four times a day (QID) | ORAL | Status: DC | PRN

## 2013-01-20 MED ORDER — POTASSIUM CHLORIDE CRYS ER 20 MEQ PO TBCR
40.0000 meq | EXTENDED_RELEASE_TABLET | Freq: Once | ORAL | Status: DC
  Filled 2013-01-20: qty 2

## 2013-01-20 MED ORDER — VENLAFAXINE HCL ER 150 MG PO CP24
150.0000 mg | ORAL_CAPSULE | Freq: Every day | ORAL | Status: DC
  Administered 2013-01-21 – 2013-01-22 (×2): 150 mg via ORAL
  Filled 2013-01-20 (×3): qty 1

## 2013-01-20 MED ORDER — INSULIN ASPART 100 UNIT/ML ~~LOC~~ SOLN: 0.0000 [IU] | Freq: Every day | SUBCUTANEOUS | Status: DC

## 2013-01-20 MED ORDER — ONDANSETRON HCL 4 MG/2ML IJ SOLN: 4.0000 mg | Freq: Four times a day (QID) | INTRAMUSCULAR | Status: DC | PRN

## 2013-01-20 NOTE — ED Notes (Addendum)
Pt went to the wound center yesterday for treatment of his pressure sores on both feet.  The right foot has not stopped bleeding since.  Pt on coumadin and aspirin.

## 2013-01-20 NOTE — ED Notes (Signed)
Critical Lab value INR 11.81

## 2013-01-20 NOTE — ED Notes (Signed)
Admitting physician at bedside, states she needs more time with patient before taking him upstairs. Charge RN aware.
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
## 2013-01-20 NOTE — ED Notes (Signed)
RN on floor aware that patient does not have an IV at this time.

## 2013-01-21 ENCOUNTER — Encounter (HOSPITAL_COMMUNITY): Payer: Medicare Other

## 2013-01-21 DIAGNOSIS — I4891 Unspecified atrial fibrillation: Secondary | ICD-10-CM

## 2013-01-21 DIAGNOSIS — D62 Acute posthemorrhagic anemia: Secondary | ICD-10-CM

## 2013-01-21 DIAGNOSIS — L8990 Pressure ulcer of unspecified site, unspecified stage: Secondary | ICD-10-CM

## 2013-01-21 LAB — CBC
HCT: 22.1 % — ABNORMAL LOW (ref 39.0–52.0)
Hemoglobin: 7.2 g/dL — ABNORMAL LOW (ref 13.0–17.0)
MCH: 28.2 pg (ref 26.0–34.0)
MCHC: 32.6 g/dL (ref 30.0–36.0)
Platelets: 200 10*3/uL (ref 150–400)
RBC: 2.55 MIL/uL — ABNORMAL LOW (ref 4.22–5.81)

## 2013-01-21 LAB — PREPARE FRESH FROZEN PLASMA

## 2013-01-21 LAB — BASIC METABOLIC PANEL
BUN: 11 mg/dL (ref 6–23)
CO2: 32 mEq/L (ref 19–32)
Calcium: 8 mg/dL — ABNORMAL LOW (ref 8.4–10.5)
Chloride: 103 mEq/L (ref 96–112)
GFR calc Af Amer: >90 mL/min (ref >90)
GFR calc non Af Amer: >90 mL/min (ref >90)
Glucose, Bld: 130 mg/dL — ABNORMAL HIGH (ref 70–99)
Potassium: 2.1 mEq/L — CL (ref 3.5–5.1)

## 2013-01-21 LAB — GLUCOSE, CAPILLARY

## 2013-01-21 LAB — PREPARE RBC (CROSSMATCH)

## 2013-01-21 MED ORDER — HYDROCOD POLST-CHLORPHEN POLST 10-8 MG/5ML PO LQCR
5.0000 mL | Freq: Once | ORAL | Status: DC
  Filled 2013-01-21: qty 5

## 2013-01-21 MED ORDER — POTASSIUM CHLORIDE 20 MEQ/15ML (10%) PO LIQD
40.0000 meq | Freq: Once | ORAL | Status: AC
  Administered 2013-01-21: 40 meq via ORAL
  Filled 2013-01-21: qty 30

## 2013-01-21 MED ORDER — OXYCODONE-ACETAMINOPHEN 5-325 MG PO TABS: 1.0000 | ORAL_TABLET | ORAL | Status: DC | PRN

## 2013-01-21 MED ORDER — SALINE SPRAY 0.65 % NA SOLN
1.0000 | NASAL | Status: DC | PRN
  Administered 2013-01-21: 1 via NASAL
  Filled 2013-01-21: qty 44

## 2013-01-21 MED ORDER — OXYCODONE HCL 5 MG PO TABS
5.0000 mg | ORAL_TABLET | ORAL | Status: DC | PRN
  Administered 2013-01-21 – 2013-01-22 (×2): 5 mg via ORAL
  Filled 2013-01-21 (×2): qty 1

## 2013-01-21 MED ORDER — OXYCODONE-ACETAMINOPHEN 5-325 MG PO TABS
1.0000 | ORAL_TABLET | ORAL | Status: DC | PRN
  Administered 2013-01-21 – 2013-01-22 (×2): 1 via ORAL
  Filled 2013-01-21 (×2): qty 1

## 2013-01-21 MED ORDER — POTASSIUM CHLORIDE 20 MEQ/15ML (10%) PO LIQD
40.0000 meq | Freq: Once | ORAL | Status: DC
  Filled 2013-01-21: qty 30

## 2013-01-21 MED ORDER — ZOLPIDEM TARTRATE 5 MG PO TABS
5.0000 mg | ORAL_TABLET | Freq: Every evening | ORAL | Status: DC | PRN
  Administered 2013-01-21: 5 mg via ORAL
  Filled 2013-01-21: qty 1

## 2013-01-21 MED ORDER — POTASSIUM CHLORIDE 10 MEQ/100ML IV SOLN
10.0000 meq | INTRAVENOUS | Status: AC
  Administered 2013-01-21 (×5): 10 meq via INTRAVENOUS
  Filled 2013-01-21 (×5): qty 100

## 2013-01-21 MED ORDER — CYCLOBENZAPRINE HCL 10 MG PO TABS
10.0000 mg | ORAL_TABLET | Freq: Once | ORAL | Status: DC
  Filled 2013-01-21: qty 1

## 2013-01-21 MED ORDER — POTASSIUM CHLORIDE CRYS ER 20 MEQ PO TBCR
30.0000 meq | EXTENDED_RELEASE_TABLET | Freq: Once | ORAL | Status: DC
  Filled 2013-01-21: qty 1

## 2013-01-21 MED ORDER — OXYCODONE HCL 5 MG PO TABS: 5.0000 mg | ORAL_TABLET | Freq: Three times a day (TID) | ORAL | Status: DC | PRN

## 2013-01-21 NOTE — Consult Note (Signed)
WOC consult Note Reason for Consult:Heel ulcers previously treated by outpatient wound care center.  Presented in Ed with bleeding after debridement due to elevated INR.  Now s/p modifying interventions and INR is currently stable. Bilateral feet with several amputated digits. Wound type: arterial insufficiency Pressure Ulcer POA: No Measurement: Left lateral forefoot at (previous site of 5th metatarsal head):  2cm x 2.5cm with black eschar obscuring wound bed; full thickenss.  Left plantar aspect of heel:  2.5cm x 2cm x 0.2cm full thickness ulcer with suspicious area of discoloration medially measuring 3cm x 2.5cm.  This area is not open, but is a blue-white discoloration. Right posterior heel:  2cm x 2cm with depth obscured by eschar vs dried blood. Wound bed: As described above Drainage (amount, consistency, odor) None at this time, Periwound:AS described above Dressing procedure/placement/frequency: I will implement conservative dressing changes every M-W-F with a calcium alginate even though there is no drainage as it is a hemostatic agent.  I have moistened it slightly in the center to help it adhere, and then covered with a dry 4x4 and secured with Kerlix/tape.  I will recommend elevation via a pressure redistribution boot (Prevalon) while in bed.  I would recommend consideration of a vascular consult due to the discolored area on the left foot, but realize that the end result could mean further invasive/operative procedures for this nice gentleman. If you desire, please order.   WOC nursing team will not follow, but will remain available to this patient, the nursing and medical team.  Please re-consult if needed. Thanks, Ladona Mow, MSN, RN, GNP, [place], CWON-AP [phone])

## 2013-01-21 NOTE — Progress Notes (Signed)
CRITICAL VALUE ALERT  Critical value received:  Potassium 2.1  Date of notification:  01/21/2013 Time of notification:  0638 AM  Critical value read back:yes  Nurse who received alert:  Arva Chafe,  MD notified (1st page):  [name]  Time of first page:  [phone]  MD notified (2nd page):  Time of second page:  Responding MD:  Merdis Delay  Time MD responded:  0730

## 2013-01-21 NOTE — Progress Notes (Signed)
[facility] PROGRESS NOTE  YVON MCCORD ZOX:[id number] DOB: 1935-08-01 DOA: 01/20/2013 PCP: [name], MD  Brief narrative: 77 y.o. male with a PMH of left hemiparesis and prior stroke, on chronic anticoagulation with coumadin for atrial fibrillation, chronic left heel wound under the care of the wound care center. Pt was admitted 01/20/2013 with complaints of bleeding from heels. Patient was evaluated at [facility] and INR there read "high". In Ed, vitals were stable. The pt had saturating bloodstained dressings to his left heel and an INR of 11.81. He was given 5 mg of oral vitamin K, FFP 2 units and now INR WNL.  Assessment/Plan:  Principal Problem:  Hemorrhage of right heel in the setting of a chronic diabetic ulcer and Coumadin induced coagulopathy  -Given 5 mg of oral vitamin K and 2 units of fresh frozen plasma due to ongoing hemorrhage.  - coumadin on hold - transfuse 1 unit  PRBC today due to drop in Hgb from 10.6 to 7.2 - likely not a candidate for future AC due to risk of bleeding Active Problems:  Hypothyroidism  - Continue synthroid  Depression  - Continue Effexor.  Uncontrolled type 2 diabetes mellitus with peripheral circulatory disorder  - CBG's in past 24 hours: 156, 130, 152 - A1c on this admission was 8 indicating poor glycemic control - appreciate diabetic coordinator input Hypokalemia  - possibly due to lasix - replace Atrial fibrillation  - on amiodarone and metoprolol - coumadin on hold Decubitus ulcers  - appreciate wound care consult Hypercholesterolemia  - Continue statin therapy.  Code Status: full code Family Communication: wife at the bedside Disposition Plan: home when stable  [name], MD  [facility] Pager [phone]  If 7PM-7AM, please contact night-coverage [email or web address] Password [email or web address] 01/21/2013, 2:53 PM   LOS: 1 day   Consultants:  None   Procedures:  None   Antibiotics:  None    HPI/Subjective: No acute overnight events.  Objective: Filed Vitals:   01/21/13 0058 01/21/13 0450 01/21/13 1330 01/21/13 1432  BP: 102/47 101/47 123/52 117/77  Pulse: 62 65 66 70  Temp: 98 F (36.7 C) 97.6 F (36.4 C) 98.3 F (36.8 C) 98.6 F (37 C)  TempSrc: Oral Oral  Oral  Resp: 18 18 16 18   Height:      Weight:      SpO2: 96% 97% 96% 97%    Intake/Output Summary (Last 24 hours) at 01/21/13 1453 Last data filed at 01/21/13 1300  Gross per 24 hour  Intake   1545 ml  Output    450 ml  Net   1095 ml    Exam:   General:  Pt is alert, not in acute distress  Cardiovascular: Regular rate and rhythm, S1/S2 appreciated with + SEM  Respiratory: Clear to auscultation bilaterally, no wheezing, no crackles, no rhonchi  Abdomen: Soft, non tender, non distended, bowel sounds present, no guarding  Extremities: LE +2-3 edema, bilateral heel dressing in place, dry, pulses DP and PT palpable bilaterally  Neuro: right ptosis otherwise non focal  Data Reviewed: Basic Metabolic Panel:  Recent Labs Lab 01/20/13 1350 01/21/13 0535  NA 139 141  K 3.1* 2.1*  CL 101 103  CO2 25 32  GLUCOSE 202* 130*  BUN 11 11  CREATININE 0.74 0.70  CALCIUM 8.4 8.0*   Liver Function Tests: No results found for this basename: AST, ALT, ALKPHOS, BILITOT, PROT, ALBUMIN,  in the last 168 hours No results found for this basename: LIPASE, AMYLASE,  in the last 168 hours No results found for this basename: AMMONIA,  in the last 168 hours CBC:  Recent Labs Lab 01/20/13 1350 01/21/13 0535  WBC 12.5* 7.7  NEUTROABS 8.8*  --   HGB 10.6* 7.2*  HCT 32.6* 22.1*  MCV 86.9 86.7  PLT 327 200   Cardiac Enzymes: No results found for this basename: CKTOTAL, CKMB, CKMBINDEX, TROPONINI,  in the last 168 hours BNP: No components found with this basename: POCBNP,  CBG:  Recent Labs Lab 01/20/13 1731 01/20/13 2132 01/21/13 0740 01/21/13 1152  GLUCAP 198* 156* 130* 152*    MRSA PCR  SCREENING     Status: None   Collection Time    01/20/13  5:58 PM      Result Value Range Status   MRSA by PCR NEGATIVE  NEGATIVE Final     Studies: No results found.  Scheduled Meds: . amiodarone  200 mg Oral Daily  . amitriptyline  25 mg Oral QHS  . atorvastatin  40 mg Oral q morning - 10a  . furosemide  20 mg Oral q morning - 10a  . insulin aspart  0-5 Units Subcutaneous QHS  . insulin aspart  0-9 Units Subcutaneous TID WC  . levothyroxine  150 mcg Oral QAC breakfast  . metoprolol succinate  12.5 mg Oral Daily  . multivitamin   1 tablet Oral q morning - 10a  . potassium chloride SA  20 mEq Oral q morning - 10a  . venlafaxine XR\  150 mg Oral Daily

## 2013-01-21 NOTE — Care Management Note (Signed)
   CARE MANAGEMENT NOTE 01/21/2013  Patient:  TREJON, DUFORD   Account Number:  [id number]  Date Initiated:  01/21/2013  Documentation initiated by:  Surina Storts  Subjective/Objective Assessment:   77 yo male admitte with bleeding foot. PTA pt from home with spouse. PCP: [name], MD     Action/Plan:   Home when stable   Anticipated DC Date:     Anticipated DC Plan:  HOME W HOME HEALTH SERVICES      DC Planning Services  CM consult      Choice offered to / List presented to:  NA   DME arranged  NA      DME agency  NA     HH arranged  NA      HH agency  NA   Status of service:  In process, will continue to follow Medicare Important Message given?   (If response is "NO", the following Medicare IM given date fields will be blank) Date Medicare IM given:   Date Additional Medicare IM given:    Discharge Disposition:    Per UR Regulation:  Reviewed for med. necessity/level of care/duration of stay  If discussed at Long Length of Stay Meetings, dates discussed:    Comments:  01/21/13 1135 Lylah Lantis,RN,MSN [phone] Chart reviewed for utilization of services. Cm recommendations for [facility] for dressing changes upon discharge. Pt followed by [facility]. No other barriers identified.
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
## 2013-01-22 ENCOUNTER — Encounter: Payer: Self-pay | Admitting: Vascular Surgery

## 2013-01-22 LAB — BASIC METABOLIC PANEL
CO2: 28 mEq/L (ref 19–32)
Calcium: 7.8 mg/dL — ABNORMAL LOW (ref 8.4–10.5)
Chloride: 105 mEq/L (ref 96–112)
Creatinine, Ser: 0.58 mg/dL (ref 0.50–1.35)
Glucose, Bld: 137 mg/dL — ABNORMAL HIGH (ref 70–99)
Potassium: 3.4 mEq/L — ABNORMAL LOW (ref 3.5–5.1)
Sodium: 138 mEq/L (ref 135–145)

## 2013-01-22 LAB — TYPE AND SCREEN
ABO/RH(D): A NEG
Antibody Screen: NEGATIVE

## 2013-01-22 LAB — CBC
HCT: 29.2 % — ABNORMAL LOW (ref 39.0–52.0)
Hemoglobin: 9.4 g/dL — ABNORMAL LOW (ref 13.0–17.0)
RBC: 3.38 MIL/uL — ABNORMAL LOW (ref 4.22–5.81)

## 2013-01-22 LAB — PROTIME-INR
INR: 2.16 — ABNORMAL HIGH (ref 0.00–1.49)
Prothrombin Time: 23.4 seconds — ABNORMAL HIGH (ref 11.6–15.2)

## 2013-01-22 LAB — GLUCOSE, CAPILLARY: Glucose-Capillary: 134 mg/dL — ABNORMAL HIGH (ref 70–99)

## 2013-01-22 MED ORDER — POTASSIUM CHLORIDE 20 MEQ/15ML (10%) PO LIQD
40.0000 meq | Freq: Once | ORAL | Status: AC
  Administered 2013-01-22: 40 meq via ORAL
  Filled 2013-01-22: qty 30

## 2013-01-22 MED ORDER — APIXABAN 5 MG PO TABS: 5.0000 mg | ORAL_TABLET | Freq: Two times a day (BID) | ORAL | Status: DC

## 2013-01-22 MED ORDER — OXYCODONE-ACETAMINOPHEN 10-325 MG PO TABS: 1.0000 | ORAL_TABLET | Freq: Three times a day (TID) | ORAL | Status: DC | PRN

## 2013-01-22 MED ORDER — POTASSIUM CHLORIDE 10 MEQ/100ML IV SOLN
10.0000 meq | INTRAVENOUS | Status: AC
  Administered 2013-01-22 (×3): 10 meq via INTRAVENOUS
  Filled 2013-01-22 (×3): qty 100

## 2013-01-22 MED ORDER — GLIPIZIDE 2.5 MG HALF TABLET: 2.5000 mg | ORAL_TABLET | Freq: Two times a day (BID) | ORAL | Status: DC

## 2013-01-22 MED ORDER — HYDROCOD POLST-CHLORPHEN POLST 10-8 MG/5ML PO LQCR: 5.0000 mL | Freq: Once | ORAL | Status: DC

## 2013-01-22 NOTE — Progress Notes (Signed)
Patient discharged per MD order. IV removed. Patient dressed and assisted into personal wheelchair from home. Discharge instructions reviewed in detail with patient's wife (primary caregiver) including the importance of stopping coumadin and that Eliquis is the new medication to treat pt's a fib. Pt's wife verbalized understanding of this and acknowledged understanding of future follow up MD appointments. Pt wheeled to the car via nurse tech for transport home in wife's wheelchair Zenaida Niece. Angelena Form, RN

## 2013-01-22 NOTE — Care Management Note (Signed)
Cm spoke with patient and spouse at the bedside concerning dc planning. Per pt's spouse pt previously active with [facility] for [facility] services. Per pt's spouse would like to continue services with agency if possible. Cm contacted [facility] rep Baxter Hire to ask about availability of services in [place].Awaiting MD order with face to face for [facility] with wound care instructions.PCP: [name], MD. No other barriers identified.    Roxy Manns Nandan Willems,RN,MSN [phone]

## 2013-01-22 NOTE — Progress Notes (Signed)
CRITICAL VALUE ALERT  Critical value received: K+ 2.7  Date of notification:  01/21/13  Time of notification:  2000  Critical value read back:Yes  Nurse who received alert:  Kenton Kingfisher, RN  MD notified (1st page):  Schorr  Time of first page:  2005  MD notified (2nd page): N/A  Time of second page: N/A  Responding MD:  Schorr  Time MD responded:  2009 (orders received)  Pt. Would only take first dose of po K+ so MD was called again around 0100am and orders were received for 3 runs of K+ via IV.

## 2013-01-22 NOTE — Discharge Summary (Signed)
Physician Discharge Summary  [name]:[id number] DOB: September 25, 1935 DOA: 01/20/2013  PCP: [name], MD  Admit date: 01/20/2013 Discharge date: 01/22/2013  Recommendations for Outpatient Follow-up:  1. please stop taking Coumadin per our discussion. New medication we started for atrial fibrillation is Eliquis 5 mg twice a day (please note that it does not need INR monitoring)  2. Appointment with cardiology ([name]) is 02/06/13 please see in discharge follow up section  3. Per cardiology you may continue taking aspirin in addition to Eliquis  4. Continue other home medications as before: Amiodarone, metoprolol  5. Please refer to wound care section for recommendations on dressing changes  6. Continue Lasix as well as potassium as before  7. Continue Elavil and Effexor  8. Please note that we have started glipizide 2.5 mg twice daily for better control of diabetes. You were not on diabetic medications before. A1c was 8 on this admission which is an indication that you have a diabetes and it needs to be treated.   Wound care dressing changes Left lateral forefoot at (previous site of 5th metatarsal head):  2cm x 2.5cm with black eschar obscuring wound bed; full thickenss.   Left plantar aspect of heel:  2.5cm x 2cm x 0.2cm full thickness ulcer with suspicious area of discoloration medially measuring 3cm x 2.5cm.  This area is not open, but is a blue-white discoloration.  Right posterior heel:  2cm x 2cm with depth obscured by eschar/ dried blood.  Dressing procedure/placement/frequency:Conservative dressing changes every M-W-F with a calcium alginate even though there is no drainage as it is a hemostatic agent.  Moistened it slightly in the center to help it adhere, and then cover it with a dry 4x4 and secure with Kerlix/tape.  Recommend elevation via a pressure redistribution boot (Prevalon) while in bed.   Pt has appt with Dr. [name] for further  evaluation.  Discharge Diagnoses:  Principal Problem:   Hemorrhage of right heel in the setting of a chronic diabetic ulcer and Coumadin induced coagulopathy Active Problems:   Hypothyroidism   Depression   Uncontrolled type 2 diabetes mellitus with peripheral circulatory disorder   Acute blood loss anemia   Hypokalemia   Atrial fibrillation   Decubitus ulcers   Hypercholesterolemia    Discharge Condition: medically stable for discharge home today; [facility] RN order in place  Diet recommendation: as tolerated  History of present illness:  77 y.o. male with a PMH of left hemiparesis and prior stroke, on chronic anticoagulation with coumadin for atrial fibrillation, chronic left heel wound under the care of the wound care center. Pt was admitted 01/20/2013 with complaints of bleeding from heels. Patient was evaluated at [facility] and INR there read "high".  In Ed, vitals were stable. The pt had saturating bloodstained dressings to his left heel and an INR of 11.81. He was given 5 mg of oral vitamin K, FFP 2 units and now INR WNL.   Assessment/Plan:   Principal Problem:  Hemorrhage of right heel in the setting of a chronic diabetic ulcer and Coumadin induced coagulopathy  -Given 5 mg of oral vitamin K and 2 units of fresh frozen plasma due to ongoing hemorrhage.  - coumadin stopped  - transfused 1 unit PRBC today due to drop in Hgb from 10.6 to 7.2  - Hemoglobin is now stable, 9.4 - Per cardiology, we will start patient on Eliquis 5 mg PO BID Active Problems:  Hypothyroidism  - Continue synthroid  Depression  -  Continue Effexor.  Uncontrolled type 2 diabetes mellitus with peripheral circulatory disorder  - CBG's in past 24 hours: 156, 130, 152  - A1c on this admission was 8 indicating poor glycemic control  - Started glipizide 2.5 mg twice daily Hypokalemia  - possibly due to lasix  - Continue potassium chloride supplement daily per previous home regimen Atrial fibrillation   - on amiodarone and metoprolol  - coumadin stopped, using Eliquis per cardio recommendations  Decubitus ulcers  - appreciate wound care consult - dressing changes as noted above  Hypercholesterolemia  - Continue statin therapy.   Code Status: full code  Family Communication: wife at the bedside   [name], MD  [facility]  Pager [phone]   Signed:  [name], MD  [facility] 01/22/2013, 11:58 AM  Pager #: [phone]   Discharge Exam: Filed Vitals:   01/22/13 0553  BP: 125/86  Pulse: 89  Temp: 98.8 F (37.1 C)  Resp: 15   Filed Vitals:   01/21/13 1641 01/21/13 2025 01/22/13 0524 01/22/13 0553  BP: 125/50 134/50 122/62 125/86  Pulse: 69 71 61 89  Temp: 99.5 F (37.5 C) 98.2 F (36.8 C) 97.9 F (36.6 C) 98.8 F (37.1 C)  TempSrc: Oral Oral Oral Oral  Resp: 18 18 16 15   Height:      Weight:      SpO2: 97% 96% 96% 100%    General: Pt is alert, follows commands appropriately, not in acute distress Cardiovascular: Regular rate and rhythm, S1/S2 appreciated Respiratory: Clear to auscultation bilaterally, no wheezing, no crackles, no rhonchi Abdominal: Soft, non tender, non distended, bowel sounds +, no guarding Extremities: B/L heel dressing in place, no blood drainage, no cyanosis, pulses palpable bilaterally DP and PT Neuro: right ptosis other wise non focal   Discharge Instructions  Discharge Orders   Future Appointments Provider Department Dept Phone   01/26/2013 1:30 PM [facility] [phone]   02/06/2013 4:15 PM Runell Gess, MD [facility] [phone]   02/10/2013 12:00 PM [name], MD [facility] [phone]   Future Orders Complete By Expires   Call MD for:  difficulty breathing, headache or visual disturbances  As directed    Call MD for:  persistant dizziness or light-headedness  As directed    Call MD for:  persistant  nausea and vomiting  As directed    Call MD for:  severe uncontrolled pain  As directed    Diet - low sodium heart healthy  As directed    Discharge instructions  As directed    Comments:     1. please stop taking Coumadin per our discussion. New medication we started for atrial fibrillation is Eliquis 5 mg twice a day (please note that it does not need INR monitoring) 2. Appointment with cardiology ([name]) is 02/06/13 please see in discharge follow up section 3. Per cardiology you may continue taking aspirin in addition to Eliquis 4. Continue other home medications as before: Amiodarone, metoprolol 5. Please refer to wound care section for recommendations on dressing changes 6. Continue Lasix as well as potassium as before 7. Continue Elavil and Effexor   Discharge wound care:  As directed    Comments:     Heel ulcers previously treated by outpatient wound care center.  Bilateral feet with several amputated digits. Left lateral forefoot at (previous site of 5th metatarsal head):  2cm x 2.5cm with black eschar  obscuring wound bed; full thickenss.   Left plantar aspect of heel:  2.5cm x 2cm x 0.2cm full thickness ulcer with suspicious area of discoloration medially measuring 3cm x 2.5cm.  This area is not open, but is a blue-white discoloration.  Right posterior heel:  2cm x 2cm with depth obscured by eschar/ dried blood. Dressing procedure/placement/frequency:Conservative dressing changes every M-W-F with a calcium alginate even though there is no drainage as it is a hemostatic agent.  Moistened it slightly in the center to help it adhere, and then cover it with a dry 4x4 and secure with Kerlix/tape.  Recommend elevation via a pressure redistribution boot (Prevalon) while in bed.   Pt has appt with Dr. [name] for further evaluation.   Increase activity slowly  As directed        Medication List    STOP taking these medications       warfarin 3 MG tablet  Commonly known as:  COUMADIN       TAKE these medications       amiodarone 200 MG tablet  Commonly known as:  PACERONE  Take 200 mg by mouth daily.     amitriptyline 25 MG tablet  Commonly known as:  ELAVIL  Take 25 mg by mouth at bedtime.     apixaban 5 MG Tabs tablet  Commonly known as:  ELIQUIS  Take 1 tablet (5 mg total) by mouth 2 (two) times daily.     aspirin EC 81 MG tablet  Take 81 mg by mouth every morning.     atorvastatin 40 MG tablet  Commonly known as:  LIPITOR  Take 40 mg by mouth every morning.     chlorpheniramine-HYDROcodone 10-8 MG/5ML Lqcr  Commonly known as:  TUSSIONEX  Take 5 mLs by mouth once.     EFFEXOR XR PO  Take 1 capsule by mouth every morning.     furosemide 20 MG tablet  Commonly known as:  LASIX  Take 20 mg by mouth every morning.     levothyroxine 150 MCG tablet  Commonly known as:  SYNTHROID, LEVOTHROID  Take 150 mcg by mouth every morning.     metoprolol succinate 25 MG 24 hr tablet  Commonly known as:  TOPROL-XL  Take 12.5 mg by mouth daily.     multivitamin with minerals Tabs tablet  Take 1 tablet by mouth every morning.     oxyCODONE-acetaminophen 10-325 MG per tablet  Commonly known as:  PERCOCET  Take 1 tablet by mouth every 8 (eight) hours as needed for pain.     potassium chloride SA 20 MEQ tablet  Commonly known as:  K-DUR,KLOR-CON  Take 20 mEq by mouth every morning.           Follow-up Information   Follow up with Runell Gess, MD On 02/06/2013. (4:15 PM)    Specialty:  Cardiology   Contact information:   [address] [place] [address] [phone]        The results of significant diagnostics from this hospitalization (including imaging, microbiology, ancillary and laboratory) are listed below for reference.    Significant Diagnostic Studies: Dg Foot Complete Left  01/12/2013   CLINICAL DATA:  Evaluate for osteomyelitis. Left heel wound and left lateral foot wound. Diabetes. History of left 5th toe  amputation) history obtained the nurse, Geraldine Contras, at the Wound Center.  EXAM: LEFT FOOT - COMPLETE 3+ VIEW  COMPARISON:  None.  FINDINGS: The bones are diffusely osteopenic. There is bandage material  about the distal lateral left foot. There postsurgical changes of left 5th toe amputation. Oval-shaped area of hyperdensity adjacent to the bandage along the plantar soft tissues of the lateral left foot at the level of the amputated 5th toes may reflect a silver containing medicine.  No periosteal reaction, fracture, or definite bony destruction is identified. The soft tissues are diffusely swollen.  IMPRESSION: 1. Diffuse soft tissue swelling of the left foot and diffuse osteopenia of the bones. No definite radiographic evidence of osteomyelitis.  2. Amputation of the 5th toe.   Electronically Signed   By: Britta Mccreedy   On: 01/12/2013 16:22   Dg Foot Complete Right  01/12/2013   CLINICAL DATA:  Diabetes, wound at lateral right foot question osteomyelitis  EXAM: RIGHT FOOT COMPLETE - 3+ VIEW  COMPARISON:  None  FINDINGS: Osseous demineralization.  Prior amputation of the 3rd toe and the 5th ray.  Question posttraumatic deformity at the proximal phalanx of the 4th toe.  No acute fracture, dislocation or bone destruction.  Dressing artifacts at lateral aspect at heel and hindfoot without definite underlying bone destruction to suggest osteomyelitis.  IMPRESSION: Prior amputations of the 3rd toe and 5th ray.  Osseous demineralization.  No definite acute osseous findings identified to suggest osteomyelitis though if this remains a clinical concern, recommend MR imaging.   Electronically Signed   By: Ulyses Southward M.D.   On: 01/12/2013 15:41    Microbiology: Recent Results (from the past 240 hour(s))  MRSA PCR SCREENING     Status: None   Collection Time    01/20/13  5:58 PM      Result Value Range Status   MRSA by PCR NEGATIVE  NEGATIVE Final   Comment:            The GeneXpert MRSA Assay (FDA     approved for  NASAL specimens     only), is one component of a     comprehensive MRSA colonization     surveillance program. It is not     intended to diagnose MRSA     infection nor to guide or     monitor treatment for     MRSA infections.     Performed at [facility]     Labs: Basic Metabolic Panel:  Recent Labs Lab 01/20/13 1350 01/21/13 0535 01/21/13 1850 01/22/13 0830  NA 139 141  --  138  K 3.1* 2.1* 2.7* 3.4*  CL 101 103  --  105  CO2 25 32  --  28  GLUCOSE 202* 130*  --  137*  BUN 11 11  --  8  CREATININE 0.74 0.70  --  0.58  CALCIUM 8.4 8.0*  --  7.8*   Liver Function Tests: No results found for this basename: AST, ALT, ALKPHOS, BILITOT, PROT, ALBUMIN,  in the last 168 hours No results found for this basename: LIPASE, AMYLASE,  in the last 168 hours No results found for this basename: AMMONIA,  in the last 168 hours CBC:  Recent Labs Lab 01/20/13 1350 01/21/13 0535 01/22/13 0830  WBC 12.5* 7.7 8.0  NEUTROABS 8.8*  --   --   HGB 10.6* 7.2* 9.4*  HCT 32.6* 22.1* 29.2*  MCV 86.9 86.7 86.4  PLT 327 200 197   Cardiac Enzymes: No results found for this basename: CKTOTAL, CKMB, CKMBINDEX, TROPONINI,  in the last 168 hours BNP: BNP (last 3 results) No results found for this basename: PROBNP,  in the last  8760 hours CBG:  Recent Labs Lab 01/21/13 0740 01/21/13 1152 01/21/13 1753 01/21/13 2152 01/22/13 0853  GLUCAP 130* 152* 189* 138* 134*    Time coordinating discharge: Over 30 minutes

## 2013-01-22 NOTE — Progress Notes (Signed)
PT Cancellation Note  Patient Details Name: [name] MRN: [id number] DOB: [birth year]   Cancelled Treatment:    Reason Eval/Treat Not Completed: Other (comment) (wife states they do not need PT)  Wife reports pt has been nonambulatory for 5 years and she has been assisting pt transfer from bed to chair. She defers offer for PT eval   Donnetta Hail 01/22/2013, 10:50 AM

## 2013-01-23 LAB — GLUCOSE, CAPILLARY: Glucose-Capillary: 170 mg/dL — ABNORMAL HIGH (ref 70–99)

## 2013-01-26 ENCOUNTER — Encounter (HOSPITAL_BASED_OUTPATIENT_CLINIC_OR_DEPARTMENT_OTHER): Payer: Medicare Other | Attending: General Surgery

## 2013-01-26 DIAGNOSIS — L89899 Pressure ulcer of other site, unspecified stage: Secondary | ICD-10-CM | POA: Insufficient documentation

## 2013-01-26 DIAGNOSIS — L8994 Pressure ulcer of unspecified site, stage 4: Secondary | ICD-10-CM | POA: Insufficient documentation

## 2013-02-02 ENCOUNTER — Ambulatory Visit: Payer: Self-pay | Admitting: Pharmacist Clinician (PhC)/ Clinical Pharmacy Specialist

## 2013-02-04 ENCOUNTER — Encounter: Payer: Self-pay | Admitting: *Deleted

## 2013-02-06 ENCOUNTER — Encounter: Payer: Self-pay | Admitting: Cardiovascular Disease

## 2013-02-06 ENCOUNTER — Ambulatory Visit (INDEPENDENT_AMBULATORY_CARE_PROVIDER_SITE_OTHER): Payer: Medicare Other | Admitting: Cardiovascular Disease

## 2013-02-06 VITALS — BP 102/70 | HR 69 | Ht 68.0 in

## 2013-02-06 DIAGNOSIS — E7800 Pure hypercholesterolemia, unspecified: Secondary | ICD-10-CM

## 2013-02-06 DIAGNOSIS — I4891 Unspecified atrial fibrillation: Secondary | ICD-10-CM

## 2013-02-06 DIAGNOSIS — I739 Peripheral vascular disease, unspecified: Secondary | ICD-10-CM

## 2013-02-06 NOTE — Assessment & Plan Note (Signed)
On statin therapy followed by his PCP 

## 2013-02-06 NOTE — Assessment & Plan Note (Signed)
History of bilateral iliac stenting and ultimately aortobifemoral bypass grafting by Dr. Tawanna Cooler early. Angiography performed remotely did show an occluded right popliteal artery with 0 vessel runoff the patient is basically will chair bound and has pressure sores on both feet. His recent Lakshmi Dopplers revealed a right ABI 0.36 which is dramatically reduced compared to the prior study which time it was 25. I do not think he is a revascularization candidate.

## 2013-02-06 NOTE — Progress Notes (Signed)
02/06/2013 [name]   Feb 16, 1936  [id number]  Primary Physician [name], MD Primary Cardiologist: Runell Gess MD [name]   HPI:  [name] is a 77 year old moderately overweight married Caucasian male patient of Dr. [name] is accompanied by his wife today. I am assuming his care. He has a history of peripheral vascular disease status post bilateral iliac stenting remotely by Dr. [name] and ultimately aortobifemoral bypass grafting by Dr. [name]. He does have an occluded right popliteal and tibial vessels as well. His other problems include history of stroke. He is hemiparetic wheelchair-bound. He has diabetes, atrial fibrillation, hyperlipidemia and hypertension.there is no history of heart disease however. Dr. [name] her to begin him on amiodarone and Coumadin anticoagulation. He was recently admitted for a supratherapeutic INR of 11 and underwent transfusion. He was changed to a novel oral anticoagulant (Eloquis).).   Current Outpatient Prescriptions  Medication Sig Dispense Refill  . amiodarone (PACERONE) 200 MG tablet Take 200 mg by mouth daily.      [name] apixaban (ELIQUIS) 5 MG TABS tablet Take 1 tablet (5 mg total) by mouth 2 (two) times daily.  60 tablet  0  . aspirin EC 81 MG tablet Take 81 mg by mouth every morning.       [name] atorvastatin (LIPITOR) 40 MG tablet Take 40 mg by mouth every morning.       . chlorpheniramine-HYDROcodone (TUSSIONEX) 10-8 MG/5ML LQCR Take 5 mLs by mouth once.  140 mL  0  . cilostazol (PLETAL) 50 MG tablet Take 50 mg by mouth 2 (two) times daily.      . furosemide (LASIX) 20 MG tablet Take 20 mg by mouth every morning.       . insulin glargine (LANTUS) 100 UNIT/ML injection Inject 10 Units into the skin at bedtime.      [name] levothyroxine (SYNTHROID, LEVOTHROID) 200 MCG tablet Take 200 mcg by mouth daily before breakfast.      . metoprolol succinate (TOPROL-XL) 25 MG 24 hr tablet Take 12.5 mg by mouth  daily.       . Multiple Vitamin (MULITIVITAMIN WITH MINERALS) TABS Take 1 tablet by mouth every morning.       [name] oxyCODONE-acetaminophen (PERCOCET) 10-325 MG per tablet Take 1 tablet by mouth every 8 (eight) hours as needed for pain.  30 tablet  0  . potassium chloride (K-DUR) 10 MEQ tablet Take 10 mEq by mouth daily.      [name] SANTYL ointment       . traZODone (DESYREL) 50 MG tablet Take 50 mg by mouth at bedtime.      . Venlafaxine HCl (EFFEXOR XR PO) Take 1 capsule by mouth every morning.      [name] amitriptyline (ELAVIL) 25 MG tablet Take 25 mg by mouth at bedtime.       No current facility-administered medications for this visit.    Allergies  Allergen Reactions  . Hydromorphone Hcl Other (See Comments)    "makes him crazy" per wife  . Penicillins Hives    History   Social History  . Marital Status: Married    Spouse Name: [name]    Number of Children: 3  . Years of Education: 8   Occupational History  . Worked in a Warehouse manager    Social History Main Topics  . Smoking status: Former Smoker    Types: Cigarettes    Quit date: 05/07/1996  . Smokeless tobacco: Never Used  . Alcohol  Use: No  . Drug Use: No  . Sexual Activity: No   Other Topics Concern  . Not on file   Social History Narrative   Lives at home with wife.  Non-ambulatory since 2009.  Wife has a lift chair, wheel chair and hospital bed at home.     Review of Systems: General: negative for chills, fever, night sweats or weight changes.  Cardiovascular: negative for chest pain, dyspnea on exertion, edema, orthopnea, palpitations, paroxysmal nocturnal dyspnea or shortness of breath Dermatological: negative for rash Respiratory: negative for cough or wheezing Urologic: negative for hematuria Abdominal: negative for nausea, vomiting, diarrhea, bright red blood per rectum, melena, or hematemesis Neurologic: negative for visual changes, syncope, or dizziness All other systems reviewed and are otherwise negative  except as noted above.    Blood pressure 102/70, pulse 69, height 5\' 8"  (1.727 m), weight 0 lb (0 kg).  General appearance: alert and no distress Neck: no adenopathy, no carotid bruit, no JVD, supple, symmetrical, trachea midline and thyroid not enlarged, symmetric, no tenderness/mass/nodules Lungs: clear to auscultation bilaterally Heart: soft outflow tract murmur Extremities: extremities normal, atraumatic, no cyanosis or edema  EKG sinus rhythm at 69 with a nonspecific IVCD poor R-wave progression with left axis deviation  ASSESSMENT AND PLAN:   Atrial fibrillation Started on amiodarone by Dr. [name]. He was on Coumadin anticoagulation was recently noted to the hospital with supratherapeutic INR and bleeding. He was transfused packed red blood cells and changed to Eliquis . He appears to be in sinus rhythm at this time.  Peripheral arterial disease History of bilateral iliac stenting and ultimately aortobifemoral bypass grafting by Dr. Tawanna Cooler early. Angiography performed remotely did show an occluded right popliteal artery with 0 vessel runoff the patient is basically will chair bound and has pressure sores on both feet. His recent Lakshmi Dopplers revealed a right ABI 0.36 which is dramatically reduced compared to the prior study which time it was 25. I do not think he is a revascularization candidate.  Hypercholesterolemia On statin therapy followed by his PCP      Runell Gess MD [facility], [place] 02/06/2013 5:24 PM

## 2013-02-06 NOTE — Assessment & Plan Note (Signed)
Started on amiodarone by Dr. [name]. He was on Coumadin anticoagulation was recently noted to the hospital with supratherapeutic INR and bleeding. He was transfused packed red blood cells and changed to Eliquis . He appears to be in sinus rhythm at this time.

## 2013-02-06 NOTE — Patient Instructions (Signed)
Your physician wants you to follow-up in: 6 months with an extender and 1 year with Dr Berry. You will receive a reminder letter in the mail two months in advance. If you don't receive a letter, please call our office to schedule the follow-up appointment.  

## 2013-02-09 ENCOUNTER — Telehealth: Payer: Self-pay | Admitting: Vascular Surgery

## 2013-02-09 ENCOUNTER — Encounter: Payer: Self-pay | Admitting: Cardiovascular Disease

## 2013-02-09 LAB — GLUCOSE, CAPILLARY: Glucose-Capillary: 249 mg/dL — ABNORMAL HIGH (ref 70–99)

## 2013-02-09 NOTE — Telephone Encounter (Signed)
Message copied by Fredrich Birks on Mon Feb 09, 2013  1:44 PM ------      Message from: Marcellus Scott      Created: Mon Feb 09, 2013  9:26 AM      Regarding: CANCELLATION        We need to cancel [facility] appointment for November 4th. He does not want to reschedule. He does not plan on coming back in. He doesn't want to have any more surgeries.            Thanks      Revonda Standard ------

## 2013-02-10 ENCOUNTER — Encounter: Payer: Medicare Other | Admitting: Vascular Surgery

## 2013-02-21 ENCOUNTER — Other Ambulatory Visit: Payer: Self-pay | Admitting: Internal Medicine

## 2013-02-23 ENCOUNTER — Encounter (HOSPITAL_BASED_OUTPATIENT_CLINIC_OR_DEPARTMENT_OTHER): Payer: Medicare Other | Attending: General Surgery

## 2013-02-23 DIAGNOSIS — L8994 Pressure ulcer of unspecified site, stage 4: Secondary | ICD-10-CM | POA: Insufficient documentation

## 2013-02-23 DIAGNOSIS — L02619 Cutaneous abscess of unspecified foot: Secondary | ICD-10-CM | POA: Insufficient documentation

## 2013-02-23 DIAGNOSIS — L89899 Pressure ulcer of other site, unspecified stage: Secondary | ICD-10-CM | POA: Insufficient documentation

## 2013-02-23 DIAGNOSIS — L89609 Pressure ulcer of unspecified heel, unspecified stage: Secondary | ICD-10-CM | POA: Insufficient documentation

## 2013-02-24 ENCOUNTER — Encounter: Payer: Medicare Other | Admitting: Vascular Surgery

## 2013-03-12 ENCOUNTER — Other Ambulatory Visit: Payer: Self-pay | Admitting: Gastroenterology

## 2013-03-12 DIAGNOSIS — R140 Abdominal distension (gaseous): Secondary | ICD-10-CM

## 2013-03-20 ENCOUNTER — Other Ambulatory Visit: Payer: Self-pay | Admitting: Gastroenterology

## 2013-03-20 ENCOUNTER — Ambulatory Visit
Admission: RE | Admit: 2013-03-20 | Discharge: 2013-03-20 | Disposition: A | Discharge status: Home / Self Care | Payer: Medicare Other | Source: Ambulatory Visit | Attending: Gastroenterology | Admitting: Gastroenterology

## 2013-03-20 DIAGNOSIS — R140 Abdominal distension (gaseous): Secondary | ICD-10-CM

## 2013-03-23 ENCOUNTER — Encounter (HOSPITAL_BASED_OUTPATIENT_CLINIC_OR_DEPARTMENT_OTHER): Payer: Medicare Other | Attending: General Surgery

## 2013-03-23 DIAGNOSIS — L97509 Non-pressure chronic ulcer of other part of unspecified foot with unspecified severity: Secondary | ICD-10-CM | POA: Insufficient documentation

## 2013-03-23 DIAGNOSIS — F329 Major depressive disorder, single episode, unspecified: Secondary | ICD-10-CM | POA: Insufficient documentation

## 2013-03-23 DIAGNOSIS — J449 Chronic obstructive pulmonary disease, unspecified: Secondary | ICD-10-CM | POA: Insufficient documentation

## 2013-03-23 DIAGNOSIS — E119 Type 2 diabetes mellitus without complications: Secondary | ICD-10-CM | POA: Insufficient documentation

## 2013-03-23 DIAGNOSIS — Z7902 Long term (current) use of antithrombotics/antiplatelets: Secondary | ICD-10-CM | POA: Insufficient documentation

## 2013-03-23 DIAGNOSIS — Z7982 Long term (current) use of aspirin: Secondary | ICD-10-CM | POA: Insufficient documentation

## 2013-03-23 DIAGNOSIS — H5440 Blindness, one eye, unspecified eye: Secondary | ICD-10-CM | POA: Insufficient documentation

## 2013-03-23 DIAGNOSIS — F3289 Other specified depressive episodes: Secondary | ICD-10-CM | POA: Insufficient documentation

## 2013-03-23 DIAGNOSIS — J4489 Other specified chronic obstructive pulmonary disease: Secondary | ICD-10-CM | POA: Insufficient documentation

## 2013-03-23 DIAGNOSIS — Z8673 Personal history of transient ischemic attack (TIA), and cerebral infarction without residual deficits: Secondary | ICD-10-CM | POA: Insufficient documentation

## 2013-03-23 DIAGNOSIS — L97409 Non-pressure chronic ulcer of unspecified heel and midfoot with unspecified severity: Secondary | ICD-10-CM | POA: Insufficient documentation

## 2013-03-23 DIAGNOSIS — Z79899 Other long term (current) drug therapy: Secondary | ICD-10-CM | POA: Insufficient documentation

## 2013-04-08 NOTE — Progress Notes (Signed)
[facility]  NAME:  [name], [name]                ACCOUNT NO.:  MEDICAL RECORD NO.:  [id number]      DATE OF BIRTH:  1935/12/20  PHYSICIAN:  Wayland Denis, DO       VISIT DATE:  04/06/2013                                  OFFICE VISIT   HISTORY OF PRESENT ILLNESS:  The patient is a 77 year old male who is here for followup on his bilateral lower extremity ulcers, one on the heel of his right foot, one on the area where his left fifth toe was amputated and then on the dorsal aspect of the left foot.  He has a blister on the dorsal aspect.  I have just been putting some antibiotic ointment on it, and collagen to the other areas.  PAST MEDICAL HISTORY:  Positive for diabetes, stroke, blindness left eye, COPD, and depression.  SURGERIES:  Include total knee replacement on the right, back surgery, fifth toe amputation on the left and the right, recent third toe amputation.  ALLERGIES:  DILAUDID and PENICILLIN.  MEDICATIONS:  Include aspirin, Lipitor, Plavix, Lasix, Synthroid, multivitamin, Percocet, potassium, Effexor, metoprolol, Eliquis, and trazodone.  REVIEW OF SYSTEMS:  Otherwise negative.  PHYSICAL EXAMINATION:  GENERAL:  He is alert and cooperative.  His caregiver gives most of the history and information. HEENT:  His right pupil is reactive.  He has gotten an ectropion on the left lower eyelid.  No cervical lymphadenopathy. ABDOMEN:  Large, but nontender. EXTREMITIES:  Upper extremity pulses are strong.  His breathing is unlabored.  His heart rate is regular.  The wounds were debrided on the left foot with removal of the blister, and some of the subcutaneous tissue and skin of the fifth toe area. Recommend the silver alginate and triple antibiotic ointment to the top. We will check a protein level, elevation, multivitamin, vitamin C, zinc, and follow up in 1 week.     Wayland Denis, DO     CS/MEDQ  D:  04/06/2013  T:  04/07/2013  Job:   [id number]
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
## 2013-07-06 ENCOUNTER — Other Ambulatory Visit: Payer: Self-pay | Admitting: *Deleted

## 2013-07-06 MED ORDER — AMIODARONE HCL 200 MG PO TABS: 200.0000 mg | ORAL_TABLET | Freq: Every day | ORAL | Status: DC

## 2013-07-06 NOTE — Telephone Encounter (Signed)
Rx was sent to pharmacy electronically. 

## 2013-08-03 ENCOUNTER — Ambulatory Visit (INDEPENDENT_AMBULATORY_CARE_PROVIDER_SITE_OTHER): Payer: Medicare Other | Admitting: Physician Assistant

## 2013-08-03 ENCOUNTER — Encounter: Payer: Self-pay | Admitting: Physician Assistant

## 2013-08-03 VITALS — BP 118/62 | HR 76 | Ht 68.0 in | Wt 175.0 lb

## 2013-08-03 DIAGNOSIS — I4891 Unspecified atrial fibrillation: Secondary | ICD-10-CM

## 2013-08-03 DIAGNOSIS — E7800 Pure hypercholesterolemia, unspecified: Secondary | ICD-10-CM

## 2013-08-03 DIAGNOSIS — I739 Peripheral vascular disease, unspecified: Secondary | ICD-10-CM

## 2013-08-03 DIAGNOSIS — I10 Essential (primary) hypertension: Secondary | ICD-10-CM | POA: Insufficient documentation

## 2013-08-03 NOTE — Patient Instructions (Signed)
1.  Follow up with Dr. Allyson SabalBerry in Six months. 2.  Eliquis samples

## 2013-08-03 NOTE — Assessment & Plan Note (Signed)
On Lipitor 
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
## 2013-08-03 NOTE — Assessment & Plan Note (Addendum)
Right is currently well controlled he is taking eloquis, amiodarone and Toprol.

## 2013-08-03 NOTE — Assessment & Plan Note (Signed)
Blood pressure is currently well controlled.

## 2013-08-11 ENCOUNTER — Other Ambulatory Visit: Payer: Self-pay

## 2013-08-11 MED ORDER — CILOSTAZOL 50 MG PO TABS: 50.0000 mg | ORAL_TABLET | Freq: Two times a day (BID) | ORAL | Status: AC

## 2013-08-11 NOTE — Telephone Encounter (Signed)
Rx was sent to pharmacy electronically. 

## 2013-08-12 NOTE — Telephone Encounter (Signed)
Encounter closed---TP 08/12/13 

## 2013-09-17 ENCOUNTER — Telehealth: Payer: Self-pay | Admitting: Cardiovascular Disease

## 2013-09-17 MED ORDER — APIXABAN 5 MG PO TABS: 5.0000 mg | ORAL_TABLET | Freq: Two times a day (BID) | ORAL | Status: AC

## 2013-09-17 NOTE — Telephone Encounter (Signed)
The Eliquis should be 5 mg instead of 20 mg.

## 2013-09-17 NOTE — Telephone Encounter (Signed)
Would like some samples of Eliquis 20 mg please.

## 2013-09-17 NOTE — Telephone Encounter (Signed)
Samples provided.  LM on home number to inform that samples were available and at front desk

## 2013-11-09 ENCOUNTER — Encounter (HOSPITAL_COMMUNITY): Payer: Self-pay | Admitting: Emergency Medicine

## 2013-11-09 DIAGNOSIS — Z87891 Personal history of nicotine dependence: Secondary | ICD-10-CM

## 2013-11-09 DIAGNOSIS — Z993 Dependence on wheelchair: Secondary | ICD-10-CM

## 2013-11-09 DIAGNOSIS — R4181 Age-related cognitive decline: Secondary | ICD-10-CM | POA: Diagnosis present

## 2013-11-09 DIAGNOSIS — R197 Diarrhea, unspecified: Secondary | ICD-10-CM | POA: Diagnosis present

## 2013-11-09 DIAGNOSIS — Z96659 Presence of unspecified artificial knee joint: Secondary | ICD-10-CM

## 2013-11-09 DIAGNOSIS — Z794 Long term (current) use of insulin: Secondary | ICD-10-CM

## 2013-11-09 DIAGNOSIS — H9190 Unspecified hearing loss, unspecified ear: Secondary | ICD-10-CM | POA: Diagnosis present

## 2013-11-09 DIAGNOSIS — Z880 Allergy status to penicillin: Secondary | ICD-10-CM

## 2013-11-09 DIAGNOSIS — Z96649 Presence of unspecified artificial hip joint: Secondary | ICD-10-CM

## 2013-11-09 DIAGNOSIS — Z833 Family history of diabetes mellitus: Secondary | ICD-10-CM

## 2013-11-09 DIAGNOSIS — S98139A Complete traumatic amputation of one unspecified lesser toe, initial encounter: Secondary | ICD-10-CM

## 2013-11-09 DIAGNOSIS — E7800 Pure hypercholesterolemia, unspecified: Secondary | ICD-10-CM | POA: Diagnosis present

## 2013-11-09 DIAGNOSIS — Z860100 Personal history of colon polyps, unspecified: Secondary | ICD-10-CM

## 2013-11-09 DIAGNOSIS — Q619 Cystic kidney disease, unspecified: Secondary | ICD-10-CM

## 2013-11-09 DIAGNOSIS — Z7901 Long term (current) use of anticoagulants: Secondary | ICD-10-CM

## 2013-11-09 DIAGNOSIS — Z9089 Acquired absence of other organs: Secondary | ICD-10-CM

## 2013-11-09 DIAGNOSIS — F329 Major depressive disorder, single episode, unspecified: Secondary | ICD-10-CM | POA: Diagnosis present

## 2013-11-09 DIAGNOSIS — Z79899 Other long term (current) drug therapy: Secondary | ICD-10-CM

## 2013-11-09 DIAGNOSIS — F3289 Other specified depressive episodes: Secondary | ICD-10-CM | POA: Diagnosis present

## 2013-11-09 DIAGNOSIS — K6389 Other specified diseases of intestine: Secondary | ICD-10-CM | POA: Diagnosis present

## 2013-11-09 DIAGNOSIS — E876 Hypokalemia: Secondary | ICD-10-CM | POA: Diagnosis present

## 2013-11-09 DIAGNOSIS — K560 Paralytic ileus: Principal | ICD-10-CM | POA: Diagnosis present

## 2013-11-09 DIAGNOSIS — I4891 Unspecified atrial fibrillation: Secondary | ICD-10-CM | POA: Diagnosis present

## 2013-11-09 DIAGNOSIS — H5440 Blindness, one eye, unspecified eye: Secondary | ICD-10-CM | POA: Diagnosis present

## 2013-11-09 DIAGNOSIS — K861 Other chronic pancreatitis: Secondary | ICD-10-CM | POA: Diagnosis present

## 2013-11-09 DIAGNOSIS — E1159 Type 2 diabetes mellitus with other circulatory complications: Secondary | ICD-10-CM | POA: Diagnosis present

## 2013-11-09 DIAGNOSIS — Z823 Family history of stroke: Secondary | ICD-10-CM

## 2013-11-09 DIAGNOSIS — E039 Hypothyroidism, unspecified: Secondary | ICD-10-CM | POA: Diagnosis present

## 2013-11-09 DIAGNOSIS — R1032 Left lower quadrant pain: Secondary | ICD-10-CM | POA: Diagnosis not present

## 2013-11-09 DIAGNOSIS — Z836 Family history of other diseases of the respiratory system: Secondary | ICD-10-CM

## 2013-11-09 DIAGNOSIS — Z885 Allergy status to narcotic agent status: Secondary | ICD-10-CM

## 2013-11-09 DIAGNOSIS — I10 Essential (primary) hypertension: Secondary | ICD-10-CM | POA: Diagnosis present

## 2013-11-09 DIAGNOSIS — I798 Other disorders of arteries, arterioles and capillaries in diseases classified elsewhere: Secondary | ICD-10-CM | POA: Diagnosis present

## 2013-11-09 DIAGNOSIS — I69920 Aphasia following unspecified cerebrovascular disease: Secondary | ICD-10-CM

## 2013-11-09 DIAGNOSIS — I69959 Hemiplegia and hemiparesis following unspecified cerebrovascular disease affecting unspecified side: Secondary | ICD-10-CM

## 2013-11-09 NOTE — ED Notes (Signed)
Patient sitting in his own wheelchair in triage, weight of wheelchair is estimated to one similar to our facility.

## 2013-11-10 ENCOUNTER — Inpatient Hospital Stay (HOSPITAL_COMMUNITY)
Admission: EM | Admit: 2013-11-10 | Discharge: 2013-11-12 | DRG: 389 | Disposition: A | Discharge status: Home / Self Care | Payer: Medicare Other | Attending: Internal Medicine | Admitting: Internal Medicine

## 2013-11-10 ENCOUNTER — Emergency Department (HOSPITAL_COMMUNITY): Payer: Medicare Other

## 2013-11-10 ENCOUNTER — Encounter (HOSPITAL_COMMUNITY): Payer: Self-pay | Admitting: Radiology

## 2013-11-10 DIAGNOSIS — K6389 Other specified diseases of intestine: Secondary | ICD-10-CM | POA: Diagnosis present

## 2013-11-10 DIAGNOSIS — H9190 Unspecified hearing loss, unspecified ear: Secondary | ICD-10-CM | POA: Diagnosis present

## 2013-11-10 DIAGNOSIS — D62 Acute posthemorrhagic anemia: Secondary | ICD-10-CM

## 2013-11-10 DIAGNOSIS — Z86010 Personal history of colonic polyps: Secondary | ICD-10-CM | POA: Diagnosis not present

## 2013-11-10 DIAGNOSIS — K5939 Other megacolon: Secondary | ICD-10-CM | POA: Diagnosis present

## 2013-11-10 DIAGNOSIS — H5440 Blindness, one eye, unspecified eye: Secondary | ICD-10-CM | POA: Diagnosis present

## 2013-11-10 DIAGNOSIS — R58 Hemorrhage, not elsewhere classified: Secondary | ICD-10-CM

## 2013-11-10 DIAGNOSIS — F329 Major depressive disorder, single episode, unspecified: Secondary | ICD-10-CM

## 2013-11-10 DIAGNOSIS — I10 Essential (primary) hypertension: Secondary | ICD-10-CM | POA: Diagnosis present

## 2013-11-10 DIAGNOSIS — Z823 Family history of stroke: Secondary | ICD-10-CM | POA: Diagnosis not present

## 2013-11-10 DIAGNOSIS — Z833 Family history of diabetes mellitus: Secondary | ICD-10-CM | POA: Diagnosis not present

## 2013-11-10 DIAGNOSIS — F32A Depression, unspecified: Secondary | ICD-10-CM

## 2013-11-10 DIAGNOSIS — E876 Hypokalemia: Secondary | ICD-10-CM | POA: Diagnosis present

## 2013-11-10 DIAGNOSIS — E7800 Pure hypercholesterolemia, unspecified: Secondary | ICD-10-CM

## 2013-11-10 DIAGNOSIS — R1032 Left lower quadrant pain: Secondary | ICD-10-CM | POA: Diagnosis present

## 2013-11-10 DIAGNOSIS — E1151 Type 2 diabetes mellitus with diabetic peripheral angiopathy without gangrene: Secondary | ICD-10-CM

## 2013-11-10 DIAGNOSIS — Z836 Family history of other diseases of the respiratory system: Secondary | ICD-10-CM | POA: Diagnosis not present

## 2013-11-10 DIAGNOSIS — E1159 Type 2 diabetes mellitus with other circulatory complications: Secondary | ICD-10-CM | POA: Diagnosis present

## 2013-11-10 DIAGNOSIS — Z79899 Other long term (current) drug therapy: Secondary | ICD-10-CM | POA: Diagnosis not present

## 2013-11-10 DIAGNOSIS — E1165 Type 2 diabetes mellitus with hyperglycemia: Secondary | ICD-10-CM

## 2013-11-10 DIAGNOSIS — Z7901 Long term (current) use of anticoagulants: Secondary | ICD-10-CM | POA: Diagnosis not present

## 2013-11-10 DIAGNOSIS — K861 Other chronic pancreatitis: Secondary | ICD-10-CM | POA: Diagnosis present

## 2013-11-10 DIAGNOSIS — I798 Other disorders of arteries, arterioles and capillaries in diseases classified elsewhere: Secondary | ICD-10-CM

## 2013-11-10 DIAGNOSIS — Z96659 Presence of unspecified artificial knee joint: Secondary | ICD-10-CM | POA: Diagnosis not present

## 2013-11-10 DIAGNOSIS — E038 Other specified hypothyroidism: Secondary | ICD-10-CM

## 2013-11-10 DIAGNOSIS — I6350 Cerebral infarction due to unspecified occlusion or stenosis of unspecified cerebral artery: Secondary | ICD-10-CM

## 2013-11-10 DIAGNOSIS — E039 Hypothyroidism, unspecified: Secondary | ICD-10-CM | POA: Diagnosis present

## 2013-11-10 DIAGNOSIS — K529 Noninfective gastroenteritis and colitis, unspecified: Secondary | ICD-10-CM

## 2013-11-10 DIAGNOSIS — I69920 Aphasia following unspecified cerebrovascular disease: Secondary | ICD-10-CM | POA: Diagnosis not present

## 2013-11-10 DIAGNOSIS — Q619 Cystic kidney disease, unspecified: Secondary | ICD-10-CM | POA: Diagnosis not present

## 2013-11-10 DIAGNOSIS — IMO0002 Reserved for concepts with insufficient information to code with codable children: Secondary | ICD-10-CM

## 2013-11-10 DIAGNOSIS — L8990 Pressure ulcer of unspecified site, unspecified stage: Secondary | ICD-10-CM

## 2013-11-10 DIAGNOSIS — I69959 Hemiplegia and hemiparesis following unspecified cerebrovascular disease affecting unspecified side: Secondary | ICD-10-CM | POA: Diagnosis not present

## 2013-11-10 DIAGNOSIS — I4891 Unspecified atrial fibrillation: Secondary | ICD-10-CM | POA: Diagnosis present

## 2013-11-10 DIAGNOSIS — F3289 Other specified depressive episodes: Secondary | ICD-10-CM | POA: Diagnosis present

## 2013-11-10 DIAGNOSIS — R4781 Slurred speech: Secondary | ICD-10-CM

## 2013-11-10 DIAGNOSIS — Z96649 Presence of unspecified artificial hip joint: Secondary | ICD-10-CM | POA: Diagnosis not present

## 2013-11-10 DIAGNOSIS — Z880 Allergy status to penicillin: Secondary | ICD-10-CM | POA: Diagnosis not present

## 2013-11-10 DIAGNOSIS — K560 Paralytic ileus: Secondary | ICD-10-CM | POA: Diagnosis present

## 2013-11-10 DIAGNOSIS — Z9089 Acquired absence of other organs: Secondary | ICD-10-CM | POA: Diagnosis not present

## 2013-11-10 DIAGNOSIS — R197 Diarrhea, unspecified: Secondary | ICD-10-CM | POA: Diagnosis present

## 2013-11-10 DIAGNOSIS — S98139A Complete traumatic amputation of one unspecified lesser toe, initial encounter: Secondary | ICD-10-CM | POA: Diagnosis not present

## 2013-11-10 DIAGNOSIS — R4181 Age-related cognitive decline: Secondary | ICD-10-CM | POA: Diagnosis present

## 2013-11-10 DIAGNOSIS — Z794 Long term (current) use of insulin: Secondary | ICD-10-CM | POA: Diagnosis not present

## 2013-11-10 DIAGNOSIS — I739 Peripheral vascular disease, unspecified: Secondary | ICD-10-CM | POA: Diagnosis present

## 2013-11-10 DIAGNOSIS — Z87891 Personal history of nicotine dependence: Secondary | ICD-10-CM | POA: Diagnosis not present

## 2013-11-10 DIAGNOSIS — Z993 Dependence on wheelchair: Secondary | ICD-10-CM | POA: Diagnosis not present

## 2013-11-10 DIAGNOSIS — K5900 Constipation, unspecified: Secondary | ICD-10-CM

## 2013-11-10 DIAGNOSIS — Z885 Allergy status to narcotic agent status: Secondary | ICD-10-CM | POA: Diagnosis not present

## 2013-11-10 HISTORY — DX: Headache: R51

## 2013-11-10 HISTORY — DX: Cardiac arrhythmia, unspecified: I49.9

## 2013-11-10 HISTORY — DX: Unspecified osteoarthritis, unspecified site: M19.90

## 2013-11-10 LAB — CBC WITH DIFFERENTIAL/PLATELET
BASOS ABS: 0 10*3/uL (ref 0.0–0.1)
BASOS PCT: 0 % (ref 0–1)
EOS ABS: 0 10*3/uL (ref 0.0–0.7)
Eosinophils Relative: 0 % (ref 0–5)
HCT: 43.9 % (ref 39.0–52.0)
Hemoglobin: 14.1 g/dL (ref 13.0–17.0)
Lymphocytes Relative: 9 % — ABNORMAL LOW (ref 12–46)
Lymphs Abs: 1.2 10*3/uL (ref 0.7–4.0)
MCH: 30.1 pg (ref 26.0–34.0)
MCHC: 32.1 g/dL (ref 30.0–36.0)
MCV: 93.6 fL (ref 78.0–100.0)
MONOS PCT: 5 % (ref 3–12)
Monocytes Absolute: 0.6 10*3/uL (ref 0.1–1.0)
Neutro Abs: 11.9 10*3/uL — ABNORMAL HIGH (ref 1.7–7.7)
Neutrophils Relative %: 86 % — ABNORMAL HIGH (ref 43–77)
Platelets: 211 10*3/uL (ref 150–400)
RBC: 4.69 MIL/uL (ref 4.22–5.81)
RDW: 15.5 % (ref 11.5–15.5)
WBC: 13.7 10*3/uL — ABNORMAL HIGH (ref 4.0–10.5)

## 2013-11-10 LAB — COMPREHENSIVE METABOLIC PANEL
ALBUMIN: 3.9 g/dL (ref 3.5–5.2)
ALK PHOS: 111 U/L (ref 39–117)
ALT: 29 U/L (ref 0–53)
AST: 26 U/L (ref 0–37)
Anion gap: 19 — ABNORMAL HIGH (ref 5–15)
BILIRUBIN TOTAL: 0.8 mg/dL (ref 0.3–1.2)
BUN: 20 mg/dL (ref 6–23)
CO2: 22 mEq/L (ref 19–32)
CREATININE: 1 mg/dL (ref 0.50–1.35)
Calcium: 9.3 mg/dL (ref 8.4–10.5)
Chloride: 98 mEq/L (ref 96–112)
GFR calc Af Amer: 83 mL/min — ABNORMAL LOW (ref >90)
GFR calc non Af Amer: 71 mL/min — ABNORMAL LOW (ref >90)
Glucose, Bld: 187 mg/dL — ABNORMAL HIGH (ref 70–99)
POTASSIUM: 3.3 meq/L — AB (ref 3.7–5.3)
Sodium: 139 mEq/L (ref 137–147)
TOTAL PROTEIN: 8.1 g/dL (ref 6.0–8.3)

## 2013-11-10 LAB — MAGNESIUM: Magnesium: 2.3 mg/dL (ref 1.5–2.5)

## 2013-11-10 LAB — GLUCOSE, CAPILLARY
GLUCOSE-CAPILLARY: 146 mg/dL — AB (ref 70–99)
GLUCOSE-CAPILLARY: 104 mg/dL — AB (ref 70–99)
GLUCOSE-CAPILLARY: 95 mg/dL (ref 70–99)
Glucose-Capillary: 146 mg/dL — ABNORMAL HIGH (ref 70–99)
Glucose-Capillary: 77 mg/dL (ref 70–99)

## 2013-11-10 LAB — I-STAT CG4 LACTIC ACID, ED: Lactic Acid, Venous: 1.29 mmol/L (ref 0.5–2.2)

## 2013-11-10 LAB — POTASSIUM: POTASSIUM: 3.3 meq/L — AB (ref 3.7–5.3)

## 2013-11-10 LAB — MRSA PCR SCREENING: MRSA BY PCR: NEGATIVE

## 2013-11-10 LAB — I-STAT TROPONIN, ED: Troponin i, poc: 0 ng/mL (ref 0.00–0.08)

## 2013-11-10 LAB — LIPASE, BLOOD: LIPASE: 8 U/L — AB (ref 11–59)

## 2013-11-10 MED ORDER — METRONIDAZOLE IN NACL 5-0.79 MG/ML-% IV SOLN
500.0000 mg | Freq: Three times a day (TID) | INTRAVENOUS | Status: DC
  Administered 2013-11-10 – 2013-11-12 (×7): 500 mg via INTRAVENOUS
  Filled 2013-11-10 (×9): qty 100

## 2013-11-10 MED ORDER — INSULIN ASPART 100 UNIT/ML ~~LOC~~ SOLN: 0.0000 [IU] | Freq: Three times a day (TID) | SUBCUTANEOUS | Status: DC

## 2013-11-10 MED ORDER — IOHEXOL 300 MG/ML  SOLN
100.0000 mL | Freq: Once | INTRAMUSCULAR | Status: AC | PRN
  Administered 2013-11-10: 100 mL via INTRAVENOUS

## 2013-11-10 MED ORDER — IOHEXOL 300 MG/ML  SOLN
25.0000 mL | INTRAMUSCULAR | Status: AC
  Administered 2013-11-10: 25 mL via ORAL

## 2013-11-10 MED ORDER — INSULIN GLARGINE 100 UNIT/ML ~~LOC~~ SOLN
8.0000 [IU] | Freq: Every day | SUBCUTANEOUS | Status: DC
  Administered 2013-11-10: 8 [IU] via SUBCUTANEOUS
  Filled 2013-11-10 (×2): qty 0.08

## 2013-11-10 MED ORDER — MORPHINE SULFATE 2 MG/ML IJ SOLN
2.0000 mg | INTRAMUSCULAR | Status: DC | PRN
  Administered 2013-11-10 – 2013-11-12 (×4): 2 mg via INTRAVENOUS
  Filled 2013-11-10 (×4): qty 1

## 2013-11-10 MED ORDER — SODIUM CHLORIDE 0.9 % IV BOLUS (SEPSIS)
1000.0000 mL | Freq: Once | INTRAVENOUS | Status: AC
  Administered 2013-11-10: 1000 mL via INTRAVENOUS

## 2013-11-10 MED ORDER — ONDANSETRON HCL 4 MG PO TABS: 4.0000 mg | ORAL_TABLET | Freq: Four times a day (QID) | ORAL | Status: DC | PRN

## 2013-11-10 MED ORDER — ONDANSETRON HCL 4 MG/2ML IJ SOLN
4.0000 mg | Freq: Once | INTRAMUSCULAR | Status: AC
  Administered 2013-11-10: 4 mg via INTRAVENOUS
  Filled 2013-11-10: qty 2

## 2013-11-10 MED ORDER — ENOXAPARIN SODIUM 80 MG/0.8ML ~~LOC~~ SOLN: 80.0000 mg | Freq: Two times a day (BID) | SUBCUTANEOUS | Status: DC

## 2013-11-10 MED ORDER — POTASSIUM CHLORIDE IN NACL 40-0.9 MEQ/L-% IV SOLN
INTRAVENOUS | Status: DC
  Administered 2013-11-10 – 2013-11-12 (×4): 125 mL/h via INTRAVENOUS
  Filled 2013-11-10 (×9): qty 1000

## 2013-11-10 MED ORDER — FENTANYL CITRATE 0.05 MG/ML IJ SOLN
50.0000 ug | Freq: Once | INTRAMUSCULAR | Status: AC
  Administered 2013-11-10: 50 ug via INTRAVENOUS
  Filled 2013-11-10: qty 2

## 2013-11-10 MED ORDER — ONDANSETRON HCL 4 MG/2ML IJ SOLN: 4.0000 mg | Freq: Four times a day (QID) | INTRAMUSCULAR | Status: DC | PRN

## 2013-11-10 MED ORDER — INSULIN ASPART 100 UNIT/ML ~~LOC~~ SOLN
0.0000 [IU] | SUBCUTANEOUS | Status: DC
  Administered 2013-11-10 – 2013-11-11 (×2): 1 [IU] via SUBCUTANEOUS
  Administered 2013-11-11: 2 [IU] via SUBCUTANEOUS
  Administered 2013-11-12 (×2): 1 [IU] via SUBCUTANEOUS

## 2013-11-10 MED ORDER — POTASSIUM CHLORIDE 10 MEQ/100ML IV SOLN
10.0000 meq | INTRAVENOUS | Status: AC
  Administered 2013-11-10 (×2): 10 meq via INTRAVENOUS
  Filled 2013-11-10 (×2): qty 100

## 2013-11-10 MED ORDER — AMIODARONE HCL 200 MG PO TABS
200.0000 mg | ORAL_TABLET | Freq: Every day | ORAL | Status: DC
Start: 2013-11-10 — End: 2013-11-12
  Administered 2013-11-10 – 2013-11-12 (×3): 200 mg via ORAL
  Filled 2013-11-10 (×3): qty 1

## 2013-11-10 MED ORDER — CIPROFLOXACIN IN D5W 400 MG/200ML IV SOLN
400.0000 mg | Freq: Two times a day (BID) | INTRAVENOUS | Status: DC
  Administered 2013-11-10 – 2013-11-12 (×5): 400 mg via INTRAVENOUS
  Filled 2013-11-10 (×6): qty 200

## 2013-11-10 MED ORDER — ENOXAPARIN SODIUM 80 MG/0.8ML ~~LOC~~ SOLN
1.0000 mg/kg | Freq: Two times a day (BID) | SUBCUTANEOUS | Status: DC
  Administered 2013-11-10 – 2013-11-12 (×5): 80 mg via SUBCUTANEOUS
  Filled 2013-11-10 (×7): qty 0.8

## 2013-11-10 MED ORDER — SALINE SPRAY 0.65 % NA SOLN
1.0000 | NASAL | Status: DC | PRN
  Administered 2013-11-10: 1 via NASAL
  Filled 2013-11-10: qty 44
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
## 2013-11-10 NOTE — ED Notes (Signed)
Dr. Butler Denmarkizwan is at bedside
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
## 2013-11-10 NOTE — ED Notes (Signed)
Pt states that he is having generalized abdominal pain. Pt did not eat dinner this evening due to pain. Pt family states that he was complaining of pain at 17:00 this evening. Pt has had 3 episodes of diarrhea today starting around the same time as the abdominal pain.

## 2013-11-10 NOTE — ED Notes (Signed)
CT aware that patient finished contrast and has a 22g Right forearm.

## 2013-11-11 DIAGNOSIS — R197 Diarrhea, unspecified: Secondary | ICD-10-CM

## 2013-11-11 DIAGNOSIS — E039 Hypothyroidism, unspecified: Secondary | ICD-10-CM

## 2013-11-11 DIAGNOSIS — E876 Hypokalemia: Secondary | ICD-10-CM

## 2013-11-11 LAB — CBC
HEMATOCRIT: 38 % — AB (ref 39.0–52.0)
HEMOGLOBIN: 12 g/dL — AB (ref 13.0–17.0)
MCH: 30.3 pg (ref 26.0–34.0)
MCHC: 31.6 g/dL (ref 30.0–36.0)
MCV: 96 fL (ref 78.0–100.0)
Platelets: 155 10*3/uL (ref 150–400)
RBC: 3.96 MIL/uL — ABNORMAL LOW (ref 4.22–5.81)
RDW: 15.7 % — ABNORMAL HIGH (ref 11.5–15.5)
WBC: 7 10*3/uL (ref 4.0–10.5)

## 2013-11-11 LAB — BASIC METABOLIC PANEL
ANION GAP: 14 (ref 5–15)
BUN: 10 mg/dL (ref 6–23)
CO2: 18 mEq/L — ABNORMAL LOW (ref 19–32)
CREATININE: 0.74 mg/dL (ref 0.50–1.35)
Calcium: 7.9 mg/dL — ABNORMAL LOW (ref 8.4–10.5)
Chloride: 110 mEq/L (ref 96–112)
GFR calc Af Amer: >90 mL/min (ref >90)
GFR calc non Af Amer: 88 mL/min — ABNORMAL LOW (ref >90)
Glucose, Bld: 86 mg/dL (ref 70–99)
Potassium: 3.6 mEq/L — ABNORMAL LOW (ref 3.7–5.3)
Sodium: 142 mEq/L (ref 137–147)

## 2013-11-11 LAB — GLUCOSE, CAPILLARY
GLUCOSE-CAPILLARY: 112 mg/dL — AB (ref 70–99)
GLUCOSE-CAPILLARY: 62 mg/dL — AB (ref 70–99)
Glucose-Capillary: 69 mg/dL — ABNORMAL LOW (ref 70–99)
Glucose-Capillary: 159 mg/dL — ABNORMAL HIGH (ref 70–99)
Glucose-Capillary: 105 mg/dL — ABNORMAL HIGH (ref 70–99)
Glucose-Capillary: 125 mg/dL — ABNORMAL HIGH (ref 70–99)
Glucose-Capillary: 180 mg/dL — ABNORMAL HIGH (ref 70–99)

## 2013-11-11 LAB — MAGNESIUM: Magnesium: 1.8 mg/dL (ref 1.5–2.5)

## 2013-11-11 MED ORDER — DEXTROSE 50 % IV SOLN
25.0000 mL | Freq: Once | INTRAVENOUS | Status: AC | PRN
  Administered 2013-11-11: 25 mL via INTRAVENOUS
  Filled 2013-11-11: qty 50

## 2013-11-11 MED ORDER — DEXTROSE 50 % IV SOLN
INTRAVENOUS | Status: AC
  Filled 2013-11-11: qty 50

## 2013-11-11 MED ORDER — INSULIN GLARGINE 100 UNIT/ML ~~LOC~~ SOLN
8.0000 [IU] | Freq: Every day | SUBCUTANEOUS | Status: DC
  Administered 2013-11-11: 8 [IU] via SUBCUTANEOUS
  Filled 2013-11-11 (×2): qty 0.08

## 2013-11-11 MED ORDER — ZOLPIDEM TARTRATE 5 MG PO TABS
5.0000 mg | ORAL_TABLET | Freq: Every evening | ORAL | Status: DC | PRN
  Administered 2013-11-11: 5 mg via ORAL
  Filled 2013-11-11: qty 1

## 2013-11-11 MED ORDER — DEXTROSE 50 % IV SOLN
25.0000 mL | Freq: Once | INTRAVENOUS | Status: AC | PRN
  Administered 2013-11-11: 25 mL via INTRAVENOUS

## 2013-11-11 NOTE — Progress Notes (Signed)
Subjective: [name] feels better overall. His pain is improved and he denies nausea/vomiting. His abdominal bloating is also improved.  Objective: Vital signs in last 24 hours: Filed Vitals:   11/10/13 0830 11/10/13 1656 11/10/13 2130 11/11/13 0439  BP: 142/60 128/55 128/51 120/58  Pulse: 66 69 61 62  Temp: 97.5 F (36.4 C) 97.8 F (36.6 C) 97.6 F (36.4 C) 97.8 F (36.6 C)  TempSrc: Oral Oral Oral Oral  Resp: 18 17 16 16   Height: 5\' 9"  (1.753 m)     Weight: 176 lb 5.9 oz (80 kg)     SpO2: 97% 96% 96% 95%    General: NAD, awake and alert HEENT: [place]/AT, left eye closed, sclera anicteric, hard of hearing Heart: RRR  Chest: breathing comfortably, scattered rhonchi  Abdomen: soft, nontender, distention improved greatly Skin: no rash  Extermities: warm and well perfused  Neuro: left sided weakness  Lab Results: Basic Metabolic Panel:  Recent Labs Lab 11/10/13 0350 11/10/13 1430 11/10/13 2203 11/11/13 0549  NA 139  --   --  142  K 3.3*  --  3.3* 3.6*  CL 98  --   --  110  CO2 22  --   --  18*  GLUCOSE 187*  --   --  86  BUN 20  --   --  10  CREATININE 1.00  --   --  0.74  CALCIUM 9.3  --   --  7.9*  MG  --  2.3  --  1.8   Liver Function Tests:  Recent Labs Lab 11/10/13 0350  AST 26  ALT 29  ALKPHOS 111  BILITOT 0.8  PROT 8.1  ALBUMIN 3.9    Recent Labs Lab 11/10/13 0350  LIPASE 8*    CBC:  Recent Labs Lab 11/10/13 0350 11/11/13 0549  WBC 13.7* 7.0  NEUTROABS 11.9*  --   HGB 14.1 12.0*  HCT 43.9 38.0*  MCV 93.6 96.0  PLT 211 155    Studies/Results: Dg Abd 1 View  11/10/2013   CLINICAL DATA:  Abdominal pain with diarrhea.  EXAM: ABDOMEN - 1 VIEW  COMPARISON:  CT 03/20/2013.  Acute abdominal series 09/26/2012.  FINDINGS: Diffuse colonic distention and interposition between the liver and anterior abdominal wall are similar to the prior CT. The small bowel appears mildly distended. There is no evidence of free intraperitoneal air or  pneumatosis. Atherosclerosis, bilateral iliac artery stents and spinal augmentation changes within the lower lumbar spine are noted. Patient is status post left hip arthroplasty.  IMPRESSION: Similar distension of the small and large bowel compared with prior CT, most consistent with chronic ileus. No new findings identified.   Electronically Signed   By: Roxy Horseman M.D.   On: 11/10/2013 00:39   Ct Abdomen Pelvis W Contrast  11/10/2013   CLINICAL DATA:  Generalized abdominal pain.  Diarrhea.  EXAM: CT ABDOMEN AND PELVIS WITH CONTRAST  TECHNIQUE: Multidetector CT imaging of the abdomen and pelvis was performed using the standard protocol following bolus administration of intravenous contrast.  CONTRAST:  OMNIPAQUE IOHEXOL 300 MG/ML  SOLN  COMPARISON:  CT of the abdomen and pelvis performed 03/20/2013  FINDINGS: Atelectasis or scarring is noted at the lung bases. Diffuse coronary artery calcifications are seen.  Prominence of the intrahepatic biliary ducts reflects prior cholecystectomy. The liver is otherwise unremarkable in appearance. The spleen is within normal limits. The pancreas and adrenal glands are unremarkable.  Scattered small bilateral renal cysts are seen. The  kidneys are otherwise unremarkable in appearance. There is no evidence of hydronephrosis. No renal or ureteral stones are seen. No perinephric stranding is appreciated.  Scattered calcification is noted along the abdominal aorta and its branches. An aortobifemoral bypass graft is noted; it appears patent. However, significant calcification is noted at the distal insertion of both limbs of the bypass graft.  Several small broad-based periumbilical hernias are seen, with partial extension of a wall of the transverse colon into these hernias.  No free fluid is identified. The small bowel is unremarkable in appearance. The stomach is within normal limits. No acute vascular abnormalities are seen.  The appendix is enlarged, measuring 1.2 cm,  with mild surrounding soft tissue inflammation and trace fluid. However, this is less prominent than on the prior study, and may reflect the patient's baseline.  The colon is diffusely distended with fluid and air, with question of minimal pneumatosis along the ascending colon. The sigmoid colon is mildly redundant. Findings raise concern for ileus.  The bladder is mildly distended and grossly unremarkable appearance. The prostate is not well assessed due to metal artifact from the patient's left hip arthroplasty. No inguinal lymphadenopathy is seen.  No acute osseous abnormalities are identified. Chronic compression deformities are seen at L4 and L5, with associated changes of vertebroplasty.  IMPRESSION: 1. Diffuse distention of the colon with fluid and air, with question of minimal pneumatosis along the ascending colon. Findings raise concern for ileus. 2. Enlargement of the appendix to 1.2 cm in diameter, with mild surrounding soft tissue inflammation and trace fluid. However, this is less prominent than on the prior study in 2014, and may reflect the patient's baseline. 3. Diffuse coronary artery calcifications seen. 4. Scattered small bilateral renal cysts seen. 5. Aortobifemoral bypass graft remains patent. However, significant calcification is seen at the distal insertion of both limbs of the bypass graft. 6. Several small broad-based periumbilical hernias noted, with partial extension of a wall of the transverse colon into these hernias. 7. Atelectasis or scarring at the lung bases.   Electronically Signed   By: Roanna RaiderJeffery  Chang M.D.   On: 11/10/2013 05:24   Medications: I have reviewed the patient's current medications. Scheduled Meds: . amiodarone  200 mg Oral Daily  . ciprofloxacin  400 mg Intravenous Q12H  . dextrose      . enoxaparin (LOVENOX) injection  1 mg/kg Subcutaneous Q12H  . insulin aspart  0-9 Units Subcutaneous Q4H  . metronidazole  500 mg Intravenous Q8H   Continuous Infusions: .  0.9 % NaCl with KCl 40 mEq / L 125 mL/hr (11/11/13 0523)   PRN Meds:.morphine injection, ondansetron (ZOFRAN) IV, ondansetron, sodium chloride  Assessment/Plan: 78 year old man with history of chronic pancreatitis presenting with abdominal pain/distention, nausea, vomiting, worsened baseline diarrhea. CT with diffuse distention of the colon. Differential diagnosis include infection/colitis, mechanical bowel obstruction (adhesive disease), ischemic colitis, neoplasm. Abdominal distention improved this AM.   Recommendations:  -Continue conservative management -Continue repletion potassium and magnesium  Griffin BasilJennifer Krall, MD 11/11/2013, 9:13 AM

## 2013-11-11 NOTE — Progress Notes (Addendum)
[facility] PROGRESS NOTE  [name] WUJ:[id number] DOB: 04/19/1936 DOA: 11/10/2013 PCP: [name], MD  Assessment/Plan: Dilatation of colon-colonic ileus with pneumatosis and leukocytosis  - does not appear to be an acute abdomen- may be related to infection  - continue Cipro/ Flagyl -rectal tube for decompression  changed to flexiseal last pm - GI pathogen panel pending - clinically improving diet advanced per GI appreciate assistance  Active Problems:  Atrial fibrillation  - currently sinus rhythm  - cont Amio with a sip of water daily- would not want him to develop a-fib with RVR  - hold oral anticoagulation for now and give full dose Lovenox instead  Slurred speech and left sided weakness- h/o Right pontine stroke  - follow for PT recs  Hypothyroidism  - IV synthroid replacement for now follow and change to po  Uncontrolled type 2 diabetes mellitus with peripheral circulatory disorder  - cont long acting insluin and place on sensistive sliding scale Q4hrs  Hypokalemia -k replaced  Code Status: full Family Communication: wife at bedside Disposition Plan:to home when medically ready   Consultants:  GI  Procedures:  none  Antibiotics:     HPI/Subjective: Still with increased diarrhea. Denies abd pain, wants to eat  Objective: Filed Vitals:   11/11/13 1330  BP: 168/83  Pulse: 73  Temp: 97.7 F (36.5 [name])  Resp: 17    Intake/Output Summary (Last 24 hours) at 11/11/13 1758 Last data filed at 11/11/13 1634  Gross per 24 hour  Intake 4367.08 ml  Output   2550 ml  Net 1817.08 ml   Filed Weights   11/09/13 2349 11/10/13 0830  Weight: 79.833 kg (176 lb) 80 kg (176 lb 5.9 oz)    Exam:  General: alert & oriented x 3 In NAD Cardiovascular: RRR, nl S1 s2 Respiratory: CTAB Abdomen: soft +BS NT/ND, no masses palpable.fflexiseal in place, and full of liquid greenish stool Extremities: No cyanosis and no edema.    Data Reviewed: Basic  Metabolic Panel:  Recent Labs Lab 11/10/13 0350 11/10/13 1430 11/10/13 2203 11/11/13 0549  NA 139  --   --  142  K 3.3*  --  3.3* 3.6*  CL 98  --   --  110  CO2 22  --   --  18*  GLUCOSE 187*  --   --  86  BUN 20  --   --  10  CREATININE 1.00  --   --  0.74  CALCIUM 9.3  --   --  7.9*  MG  --  2.3  --  1.8   Liver Function Tests:  Recent Labs Lab 11/10/13 0350  AST 26  ALT 29  ALKPHOS 111  BILITOT 0.8  PROT 8.1  ALBUMIN 3.9    Recent Labs Lab 11/10/13 0350  LIPASE 8*   No results found for this basename: AMMONIA,  in the last 168 hours CBC:  Recent Labs Lab 11/10/13 0350 11/11/13 0549  WBC 13.7* 7.0  NEUTROABS 11.9*  --   HGB 14.1 12.0*  HCT 43.9 38.0*  MCV 93.6 96.0  PLT 211 155   Cardiac Enzymes: No results found for this basename: CKTOTAL, CKMB, CKMBINDEX, TROPONINI,  in the last 168 hours BNP (last 3 results) No results found for this basename: PROBNP,  in the last 8760 hours CBG:  Recent Labs Lab 11/11/13 0422 11/11/13 0738 11/11/13 0851 11/11/13 1200 11/11/13 1627  GLUCAP 112* 69* 159* 105* 125*    Recent Results (from the  past 240 hour(s))  MRSA PCR SCREENING     Status: None   Collection Time    11/10/13  8:30 AM      Result Value Ref Range Status   MRSA by PCR NEGATIVE  NEGATIVE Final   Comment:            The GeneXpert MRSA Assay (FDA     approved for NASAL specimens     only), is one component of a     comprehensive MRSA colonization     surveillance program. It is not     intended to diagnose MRSA     infection nor to guide or     monitor treatment for     MRSA infections.     Studies: Dg Abd 1 View  11/10/2013   CLINICAL DATA:  Abdominal pain with diarrhea.  EXAM: ABDOMEN - 1 VIEW  COMPARISON:  CT 03/20/2013.  Acute abdominal series 09/26/2012.  FINDINGS: Diffuse colonic distention and interposition between the liver and anterior abdominal wall are similar to the prior CT. The small bowel appears mildly distended. There  is no evidence of free intraperitoneal air or pneumatosis. Atherosclerosis, bilateral iliac artery stents and spinal augmentation changes within the lower lumbar spine are noted. Patient is status post left hip arthroplasty.  IMPRESSION: Similar distension of the small and large bowel compared with prior CT, most consistent with chronic ileus. No new findings identified.   Electronically Signed   By: Roxy Horseman M.D.   On: 11/10/2013 00:39   Ct Abdomen Pelvis W Contrast  11/10/2013   CLINICAL DATA:  Generalized abdominal pain.  Diarrhea.  EXAM: CT ABDOMEN AND PELVIS WITH CONTRAST  TECHNIQUE: Multidetector CT imaging of the abdomen and pelvis was performed using the standard protocol following bolus administration of intravenous contrast.  CONTRAST:  OMNIPAQUE IOHEXOL 300 MG/ML  SOLN  COMPARISON:  CT of the abdomen and pelvis performed 03/20/2013  FINDINGS: Atelectasis or scarring is noted at the lung bases. Diffuse coronary artery calcifications are seen.  Prominence of the intrahepatic biliary ducts reflects prior cholecystectomy. The liver is otherwise unremarkable in appearance. The spleen is within normal limits. The pancreas and adrenal glands are unremarkable.  Scattered small bilateral renal cysts are seen. The kidneys are otherwise unremarkable in appearance. There is no evidence of hydronephrosis. No renal or ureteral stones are seen. No perinephric stranding is appreciated.  Scattered calcification is noted along the abdominal aorta and its branches. An aortobifemoral bypass graft is noted; it appears patent. However, significant calcification is noted at the distal insertion of both limbs of the bypass graft.  Several small broad-based periumbilical hernias are seen, with partial extension of a wall of the transverse colon into these hernias.  No free fluid is identified. The small bowel is unremarkable in appearance. The stomach is within normal limits. No acute vascular abnormalities are seen.   The appendix is enlarged, measuring 1.2 cm, with mild surrounding soft tissue inflammation and trace fluid. However, this is less prominent than on the prior study, and may reflect the patient's baseline.  The colon is diffusely distended with fluid and air, with question of minimal pneumatosis along the ascending colon. The sigmoid colon is mildly redundant. Findings raise concern for ileus.  The bladder is mildly distended and grossly unremarkable appearance. The prostate is not well assessed due to metal artifact from the patient's left hip arthroplasty. No inguinal lymphadenopathy is seen.  No acute osseous abnormalities are identified. Chronic compression deformities are  seen at L4 and L5, with associated changes of vertebroplasty.  IMPRESSION: 1. Diffuse distention of the colon with fluid and air, with question of minimal pneumatosis along the ascending colon. Findings raise concern for ileus. 2. Enlargement of the appendix to 1.2 cm in diameter, with mild surrounding soft tissue inflammation and trace fluid. However, this is less prominent than on the prior study in 2014, and may reflect the patient's baseline. 3. Diffuse coronary artery calcifications seen. 4. Scattered small bilateral renal cysts seen. 5. Aortobifemoral bypass graft remains patent. However, significant calcification is seen at the distal insertion of both limbs of the bypass graft. 6. Several small broad-based periumbilical hernias noted, with partial extension of a wall of the transverse colon into these hernias. 7. Atelectasis or scarring at the lung bases.   Electronically Signed   By: Roanna RaiderJeffery  Chang M.D.   On: 11/10/2013 05:24    Scheduled Meds: . amiodarone  200 mg Oral Daily  . ciprofloxacin  400 mg Intravenous Q12H  . enoxaparin (LOVENOX) injection  1 mg/kg Subcutaneous Q12H  . insulin aspart  0-9 Units Subcutaneous Q4H  . insulin glargine  8 Units Subcutaneous QHS  . metronidazole  500 mg Intravenous Q8H   Continuous  Infusions: . 0.9 % NaCl with KCl 40 mEq / L 125 mL/hr (11/11/13 1425)    Principal Problem:   Dilatation of colon- ileus  Active Problems:   Slurred speech   Right pontine stroke   Hypothyroidism   Uncontrolled type 2 diabetes mellitus with peripheral circulatory disorder   Hypokalemia   Atrial fibrillation   Peripheral arterial disease    Time spent: 25    [facility] Ball[facility] CampusVIYUOH,[name]  [facility] Pager [phone]. If 7PM-7AM, please contact night-coverage at [email or web address], password [email or web address] 11/11/2013, 5:58 PM  LOS: 1 day

## 2013-11-11 NOTE — Progress Notes (Signed)
Hypoglycemic Event  CBG: 62  Treatment: D50 IV 25 mL  Symptoms: None  Follow-up CBG: Time:0415 CBG Result:112  Possible Reasons for Event: Inadequate meal intake  Comments/MD notified:    [name], Cline Coolsngelo Ray  Remember to initiate Hypoglycemia Order Set & complete

## 2013-11-11 NOTE — Evaluation (Signed)
Physical Therapy Evaluation Patient Details Name: [name] MRN: [id number] DOB: 23-Jul-1935 Today's Date: 11/11/2013   History of Present Illness  [name] is a 78 y.o. male with chronic pancreatitis, CVA with paralysis of left side, HTN, hypothyroid and PVD who presents with abdominal pain and distension starting yesterday. He has chronic diarrhea (from pancreatitis) but without eating had 3 very loose stools last night. CT abd/pelvis reveals a colonic ileus. History of stroke 2009 affecting L side, L eye blindness, a-fib, chronic pancreatitis, dry eyes, depression, hypercholesterolemia, DM, HTN, TKA RLE, back surgery, major trauma, L THA, toe amputation LLE, iliac artery stent, kyphoplasty, EF 50-55%, PAD.  Lives with wife who cares for  him and assists his ADL's and transfers to wheelchair.  Clinical Impression  This patient is in bed with symptoms of congestion of sinuses, limiting his tolerance for being up to bedside.  After PT sat him up, wife arrived to encourage him but continues to decline OOB to chair.  Nsg is aware of the issue and will follow up to allow him to be OOB later today.    Follow Up Recommendations      Equipment Recommendations       Recommendations for Other Services       Precautions / Restrictions Precautions Precautions: Back;Fall Precaution Booklet Issued: No Precaution Comments: Reviewed precautions, pt not a current surgery pt (Instruction to pt for body mechanics, previous surgery) Restrictions Weight Bearing Restrictions: No      Mobility  Bed Mobility Overal bed mobility: Needs Assistance Bed Mobility: Sidelying to Sit;Supine to Sit;Sit to Supine   Sidelying to sit: Max assist Supine to sit: Max assist Sit to supine: Max assist      Transfers                    Ambulation/Gait                Stairs            Wheelchair Mobility    Modified Rankin (Stroke Patients Only) Modified Rankin  (Stroke Patients Only) Pre-Morbid Rankin Score: Moderately severe disability Modified Rankin: Moderately severe disability     Balance Overall balance assessment: Needs assistance Sitting-balance support: Single extremity supported;Feet supported     Postural control: Right lateral lean                                   Pertinent Vitals/Pain BP 120/58, pulse 62 and O2 sat 95%.    Home Living Family/patient expects to be discharged to:: Private residence Living Arrangements: Spouse/significant other Available Help at Discharge: Family Type of Home: House Home Access: Level entry     Home Layout: One level Home Equipment: Wheelchair - manual      Prior Function Level of Independence: Needs assistance   Gait / Transfers Assistance Needed: non ambulatory (non ambulatory)           Hand Dominance   Dominant Hand: Right    Extremity/Trunk Assessment               Lower Extremity Assessment: Generalized weakness         Communication   Communication: No difficulties  Cognition Arousal/Alertness: Awake/alert           Memory: Decreased recall of precautions              General Comments  Exercises        Assessment/Plan    PT Assessment    PT Diagnosis     PT Problem List    PT Treatment Interventions     PT Goals (Current goals can be found in the Care Plan section) Acute Rehab PT Goals Patient Stated Goal: To go home with wife to get into his own wheelchair PT Goal Formulation: With patient/family Time For Goal Achievement: 11/18/13 Potential to Achieve Goals: Good    Frequency     Barriers to discharge        Co-evaluation               End of Session                 Time: [id number] PT Time Calculation (min): 24 min   Charges:   PT Evaluation $Initial PT Evaluation Tier I: 1 Procedure PT Treatments $Therapeutic Activity: 8-22 mins   PT G Codes:          Ivar DrapeStout, Islay Polanco  E 11/11/2013, 10:13 AM  Samul Dadauth Lyzette Reinhardt, PT MS Acute Rehab Dept. Number: [phone]

## 2013-11-11 NOTE — Progress Notes (Signed)
Patient ID: Joshua BarretteWilliam C [name], male   DOB: 07/24/1935, 78 y.o.   MRN: [id number] [name]  Joshua BarretteWilliam C [name] 78 y.o. 03/14/1936   Subjective: Feels better. Wants to eat and go home. Denies abdominal pain. Bowels moving.  Objective: Vital signs in last 24 hours: Filed Vitals:   11/11/13 0439  BP: 120/58  Pulse: 62  Temp: 97.8 F (36.6 C)  Resp: 16    Physical Exam: Gen: elderly, frail, hard of hearing, no acute distress Abd: softer, nondistended, nontender, +BS  Lab Results:  Recent Labs  11/10/13 0350 11/10/13 1430 11/10/13 2203 11/11/13 0549  NA 139  --   --  142  K 3.3*  --  3.3* 3.6*  CL 98  --   --  110  CO2 22  --   --  18*  GLUCOSE 187*  --   --  86  BUN 20  --   --  10  CREATININE 1.00  --   --  0.74  CALCIUM 9.3  --   --  7.9*  MG  --  2.3  --  1.8    Recent Labs  11/10/13 0350  AST 26  ALT 29  ALKPHOS 111  BILITOT 0.8  PROT 8.1  ALBUMIN 3.9    Recent Labs  11/10/13 0350 11/11/13 0549  WBC 13.7* 7.0  NEUTROABS 11.9*  --   HGB 14.1 12.0*  HCT 43.9 38.0*  MCV 93.6 96.0  PLT 211 155   No results found for this basename: LABPROT, INR,  in the last 72 hours    Assessment/Plan: Resolving Ileus - start clear liquids and advance as tolerated. Replace K. If able to tolerate POs ok to go home tomorrow from a gut standpoint. F/U with us prn.   [name] C. 11/11/2013, 10:51 AM

## 2013-11-12 LAB — CBC
HEMATOCRIT: 37.9 % — AB (ref 39.0–52.0)
HEMOGLOBIN: 11.8 g/dL — AB (ref 13.0–17.0)
MCH: 28.9 pg (ref 26.0–34.0)
MCHC: 31.1 g/dL (ref 30.0–36.0)
MCV: 92.7 fL (ref 78.0–100.0)
Platelets: 186 10*3/uL (ref 150–400)
RBC: 4.09 MIL/uL — ABNORMAL LOW (ref 4.22–5.81)
RDW: 15.7 % — ABNORMAL HIGH (ref 11.5–15.5)
WBC: 6.3 10*3/uL (ref 4.0–10.5)

## 2013-11-12 LAB — GLUCOSE, CAPILLARY
GLUCOSE-CAPILLARY: 102 mg/dL — AB (ref 70–99)
Glucose-Capillary: 104 mg/dL — ABNORMAL HIGH (ref 70–99)
Glucose-Capillary: 128 mg/dL — ABNORMAL HIGH (ref 70–99)
Glucose-Capillary: 145 mg/dL — ABNORMAL HIGH (ref 70–99)

## 2013-11-12 MED ORDER — TRAMADOL HCL 50 MG PO TABS
50.0000 mg | ORAL_TABLET | ORAL | Status: AC
  Administered 2013-11-12: 50 mg via ORAL
  Filled 2013-11-12: qty 1

## 2013-11-12 NOTE — Discharge Summary (Addendum)
Physician Discharge Summary  [name] UEA:[id number] DOB: 06/19/1935 DOA: 11/10/2013  PCP: Joshua RavelHAMRICK,MAURA L, MD  Admit date: 11/10/2013 Discharge date: 11/12/2013  Time spent: >30 minutes  Recommendations for Outpatient Follow-up:  Follow-up Information   [facility] (in 1week, call for aapt upon discharge)    Specialty:  Family Medicine   Contact information:   Dr. Burnell BlanksMaura Snow [address] [place] [address] [phone]       Follow up with Joshua FriarSCHOOLER,VINCENT C., MD. (GI, as needed)    Specialty:  Gastroenterology   Contact information:   [address] [place] [address] [phone]      follow up labs per PCP -Pending GI pathogen panel  Discharge Diagnoses:  Principal Problem:   Dilatation of colon- ileus  Active Problems:   Slurred speech   Right pontine stroke   Hypothyroidism   Uncontrolled type 2 diabetes mellitus with peripheral circulatory disorder   Hypokalemia   Atrial fibrillation   Peripheral arterial disease   Discharge Condition: Improved/stable  Diet recommendation: Modified carbohydrate  Filed Weights   11/09/13 2349 11/10/13 0830  Weight: 79.833 kg (176 lb) 80 kg (176 lb 5.9 oz)    History of present illness:  Joshua BarretteWilliam C Snow is a 78 y.o. male with chronic pancreatitis, CVA with paralysis of left side, HTn, hypothyroid and PVD who presents with abdominal pain and distension starting yesterday. No fevers. He has chronic diarrhea (from pancreatitis) but without eating had 3 very loose stools last night. He vomited once when in the waiting room around 12 PM last night.  CT abd/pelvis reveals a colonic ileus, air fluid levels with possible pneumatosis in ascending colon. He was admitted for further evaluation and management   Hospital Course:  Dilatation of colon/colonic ileus with pneumatosis and leukocytosis  - As discussed above upon admission patient had CT scan of abdomen  and pelvis with findings consistent with colonic ileus -findings did not appear to be consistent with an acute abdomen -rectal tube for decompression and on followup changed to flexiseal  -Patient was empirically placed on antibiotics on admission -GI was consulted and [name] followed patient, patient responded well to decompression with rectal tube - GI pathogen panel was ordered but the results remain pending at this time>> the patient improved clinically-no further abdominal pain or nausea vomiting. He was started on a diet and has been tolerating it well and wants to go home. - GI followed up with patient today and [name] recommends no further antibiotics>> his to followup with his PCP for results of the  GI pathogen panel and further management as appropriate. His to followup with GI as needed Active Problems:  Atrial fibrillation  - currently sinus rhythm  - cont Amio with a sip of water daily- would not want him to develop a-fib with RVR  - He was placed on Lovenox full dose in the hospital, he is to resume his Eliquis on discharge and followup with PCP Slurred speech and left sided weakness- h/o Right pontine stroke  - Patient was seen by PT in the hospital further recommendations given. Hypothyroidism  - Patient was placed on IV Synthroid on the hospital, his to resume his outpatient Synthroid on discharge and followup with PCP Uncontrolled type 2 diabetes mellitus with peripheral circulatory disorder  - cont long acting insluin and place on sensistive sliding scale Q4hrs  Hypokalemia  -secondary to GI losses, k was replaced in the hospital.  Diabetes  mellitus -Patient to continue his outpatient insulin regimen/Lantus upon discharge and followup with PCP History of chronic pancreatitis with chronic diarrhea -Patient to continue his pancreatic enzymes upon discharge, and followup with his PCP. Consultants:  GI Procedures:  none   Discharge Exam: Filed Vitals:    11/12/13 1324  BP: 153/80  Pulse: 57  Temp: 97.6 F (36.4 C)  Resp: 17   Exam:  General: alert & oriented x 3 In NAD  Cardiovascular: RRR, nl S1 s2  Respiratory: CTAB  Abdomen: soft +BS NT/ND, no masses palpable. Flexiseal removed Extremities: No cyanosis and no edema.    Discharge Instructions You were cared for by a hospitalist during your hospital stay. If you have any questions about your discharge medications or the care you received while you were in the hospital after you are discharged, you can call the unit and asked to speak with the hospitalist on call if the hospitalist that took care of you is not available. Once you are discharged, your primary care physician will handle any further medical issues. Please note that NO REFILLS for any discharge medications will be authorized once you are discharged, as it is imperative that you return to your primary care physician (or establish a relationship with a primary care physician if you do not have one) for your aftercare needs so that they can reassess your need for medications and monitor your lab values.  Discharge Instructions   Diet Carb Modified    Complete by:  As directed      Increase activity slowly    Complete by:  As directed             Medication List    STOP taking these medications       oxyCODONE-acetaminophen 5-325 MG per tablet  Commonly known as:  PERCOCET/ROXICET     potassium chloride 10 MEQ tablet  Commonly known as:  K-DUR      TAKE these medications       amiodarone 200 MG tablet  Commonly known as:  PACERONE  Take 1 tablet (200 mg total) by mouth daily.     amitriptyline 25 MG tablet  Commonly known as:  ELAVIL  Take 25 mg by mouth at bedtime.     apixaban 5 MG Tabs tablet  Commonly known as:  ELIQUIS  Take 1 tablet (5 mg total) by mouth 2 (two) times daily.     atorvastatin 40 MG tablet  Commonly known as:  LIPITOR  Take 40 mg by mouth every morning.     cilostazol 50 MG tablet   Commonly known as:  PLETAL  Take 1 tablet (50 mg total) by mouth 2 (two) times daily.     furosemide 20 MG tablet  Commonly known as:  LASIX  Take 20 mg by mouth every morning.     insulin glargine 100 UNIT/ML injection  Commonly known as:  LANTUS  Inject 16 Units into the skin at bedtime.     KLOR-CON M20 20 MEQ tablet  Generic drug:  potassium chloride SA  Take 20 mEq by mouth 2 (two) times daily.     levothyroxine 200 MCG tablet  Commonly known as:  SYNTHROID, LEVOTHROID  Take 200 mcg by mouth daily before breakfast.     lipase/protease/amylase 29562 UNITS Cpep capsule  Commonly known as:  CREON-12/PANCREASE  Take 3 capsules by mouth 3 (three) times daily before meals.     multivitamin with minerals Tabs tablet  Take 1 tablet by mouth  every morning.     traMADol 50 MG tablet  Commonly known as:  ULTRAM  Take 50 mg by mouth at bedtime.     venlafaxine XR 75 MG 24 hr capsule  Commonly known as:  EFFEXOR-XR  Take 75 mg by mouth daily with breakfast.       Allergies  Allergen Reactions  . Hydromorphone Hcl Other (See Comments)    "makes him crazy" per wife  . Penicillins Hives       Follow-up Information   Follow up with [facility] L, MD. (in 1week, call for aapt upon discharge)    Specialty:  Family Medicine   Contact information:   Dr. Burnell Blanks [address] [place] [address] [phone]       Follow up with Joshua Friar., MD. (GI, as needed)    Specialty:  Gastroenterology   Contact information:   [address] [place] [address] [phone]        The results of significant diagnostics from this hospitalization (including imaging, microbiology, ancillary and laboratory) are listed below for reference.    Significant Diagnostic Studies: Dg Abd 1 View  11/10/2013   CLINICAL DATA:  Abdominal pain with diarrhea.  EXAM: ABDOMEN - 1 VIEW  COMPARISON:  CT 03/20/2013.  Acute abdominal series 09/26/2012.   FINDINGS: Diffuse colonic distention and interposition between the liver and anterior abdominal wall are similar to the prior CT. The small bowel appears mildly distended. There is no evidence of free intraperitoneal air or pneumatosis. Atherosclerosis, bilateral iliac artery stents and spinal augmentation changes within the lower lumbar spine are noted. Patient is status post left hip arthroplasty.  IMPRESSION: Similar distension of the small and large bowel compared with prior CT, most consistent with chronic ileus. No new findings identified.   Electronically Signed   By: Roxy Horseman M.D.   On: 11/10/2013 00:39   Ct Abdomen Pelvis W Contrast  11/10/2013   CLINICAL DATA:  Generalized abdominal pain.  Diarrhea.  EXAM: CT ABDOMEN AND PELVIS WITH CONTRAST  TECHNIQUE: Multidetector CT imaging of the abdomen and pelvis was performed using the standard protocol following bolus administration of intravenous contrast.  CONTRAST:  OMNIPAQUE IOHEXOL 300 MG/ML  SOLN  COMPARISON:  CT of the abdomen and pelvis performed 03/20/2013  FINDINGS: Atelectasis or scarring is noted at the lung bases. Diffuse coronary artery calcifications are seen.  Prominence of the intrahepatic biliary ducts reflects prior cholecystectomy. The liver is otherwise unremarkable in appearance. The spleen is within normal limits. The pancreas and adrenal glands are unremarkable.  Scattered small bilateral renal cysts are seen. The kidneys are otherwise unremarkable in appearance. There is no evidence of hydronephrosis. No renal or ureteral stones are seen. No perinephric stranding is appreciated.  Scattered calcification is noted along the abdominal aorta and its branches. An aortobifemoral bypass graft is noted; it appears patent. However, significant calcification is noted at the distal insertion of both limbs of the bypass graft.  Several small broad-based periumbilical hernias are seen, with partial extension of a wall of the transverse  colon into these hernias.  No free fluid is identified. The small bowel is unremarkable in appearance. The stomach is within normal limits. No acute vascular abnormalities are seen.  The appendix is enlarged, measuring 1.2 cm, with mild surrounding soft tissue inflammation and trace fluid. However, this is less prominent than on the prior study, and may reflect the patient's baseline.  The colon is diffusely distended with fluid  and air, with question of minimal pneumatosis along the ascending colon. The sigmoid colon is mildly redundant. Findings raise concern for ileus.  The bladder is mildly distended and grossly unremarkable appearance. The prostate is not well assessed due to metal artifact from the patient's left hip arthroplasty. No inguinal lymphadenopathy is seen.  No acute osseous abnormalities are identified. Chronic compression deformities are seen at L4 and L5, with associated changes of vertebroplasty.  IMPRESSION: 1. Diffuse distention of the colon with fluid and air, with question of minimal pneumatosis along the ascending colon. Findings raise concern for ileus. 2. Enlargement of the appendix to 1.2 cm in diameter, with mild surrounding soft tissue inflammation and trace fluid. However, this is less prominent than on the prior study in 2014, and may reflect the patient's baseline. 3. Diffuse coronary artery calcifications seen. 4. Scattered small bilateral renal cysts seen. 5. Aortobifemoral bypass graft remains patent. However, significant calcification is seen at the distal insertion of both limbs of the bypass graft. 6. Several small broad-based periumbilical hernias noted, with partial extension of a wall of the transverse colon into these hernias. 7. Atelectasis or scarring at the lung bases.   Electronically Signed   By: Roanna Raider M.D.   On: 11/10/2013 05:24    Microbiology: Recent Results (from the past 240 hour(s))  MRSA PCR SCREENING     Status: None   Collection Time     11/10/13  8:30 AM      Result Value Ref Range Status   MRSA by PCR NEGATIVE  NEGATIVE Final   Comment:            The GeneXpert MRSA Assay (FDA     approved for NASAL specimens     only), is one component of a     comprehensive MRSA colonization     surveillance program. It is not     intended to diagnose MRSA     infection nor to guide or     monitor treatment for     MRSA infections.     Labs: Basic Metabolic Panel:  Recent Labs Lab 11/10/13 0350 11/10/13 1430 11/10/13 2203 11/11/13 0549  NA 139  --   --  142  K 3.3*  --  3.3* 3.6*  CL 98  --   --  110  CO2 22  --   --  18*  GLUCOSE 187*  --   --  86  BUN 20  --   --  10  CREATININE 1.00  --   --  0.74  CALCIUM 9.3  --   --  7.9*  MG  --  2.3  --  1.8   Liver Function Tests:  Recent Labs Lab 11/10/13 0350  AST 26  ALT 29  ALKPHOS 111  BILITOT 0.8  PROT 8.1  ALBUMIN 3.9    Recent Labs Lab 11/10/13 0350  LIPASE 8*   No results found for this basename: AMMONIA,  in the last 168 hours CBC:  Recent Labs Lab 11/10/13 0350 11/11/13 0549 11/12/13 0348  WBC 13.7* 7.0 6.3  NEUTROABS 11.9*  --   --   HGB 14.1 12.0* 11.8*  HCT 43.9 38.0* 37.9*  MCV 93.6 96.0 92.7  PLT 211 155 186   Cardiac Enzymes: No results found for this basename: CKTOTAL, CKMB, CKMBINDEX, TROPONINI,  in the last 168 hours BNP: BNP (last 3 results) No results found for this basename: PROBNP,  in the last 8760 hours CBG:  Recent  Labs Lab 11/11/13 2013 11/12/13 0003 11/12/13 0405 11/12/13 0728 11/12/13 1131  GLUCAP 180* 128* 102* 104* 145*       Signed:  Cabell Lazenby C  [facility] 11/12/2013, 2:39 PM

## 2013-11-12 NOTE — Progress Notes (Signed)
   Subjective: [name] continues to feel better overall. He is eating breakfast without emesis. Endorses some nausea. Denies abdominal pain. He had some neck and back pain this AM for which he received morphine.  Objective: Vital signs in last 24 hours: Filed Vitals:   11/11/13 0439 11/11/13 1330 11/11/13 2306 11/12/13 0601  BP: 120/58 168/83 152/70 136/52  Pulse: 62 73 58 59  Temp: 97.8 F (36.6 C) 97.7 F (36.5 C) 98 F (36.7 C) 98.3 F (36.8 C)  TempSrc: Oral Oral Oral   Resp: 16 17 16 17   Height:      Weight:      SpO2: 95% 96% 99% 96%    General: NAD, awake and alert HEENT: [place]/AT, left eye closed, sclera anicteric, hard of hearing Heart: RRR  Chest: breathing comfortably, scattered rhonchi  Abdomen: soft, nontender, distention improved Skin: no rash  Extermities: warm and well perfused  Neuro: left sided weakness  Lab Results: Basic Metabolic Panel:  Recent Labs Lab 11/10/13 0350 11/10/13 1430 11/10/13 2203 11/11/13 0549  NA 139  --   --  142  K 3.3*  --  3.3* 3.6*  CL 98  --   --  110  CO2 22  --   --  18*  GLUCOSE 187*  --   --  86  BUN 20  --   --  10  CREATININE 1.00  --   --  0.74  CALCIUM 9.3  --   --  7.9*  MG  --  2.3  --  1.8   Liver Function Tests:  Recent Labs Lab 11/10/13 0350  AST 26  ALT 29  ALKPHOS 111  BILITOT 0.8  PROT 8.1  ALBUMIN 3.9    Recent Labs Lab 11/10/13 0350  LIPASE 8*    CBC:  Recent Labs Lab 11/10/13 0350 11/11/13 0549 11/12/13 0348  WBC 13.7* 7.0 6.3  NEUTROABS 11.9*  --   --   HGB 14.1 12.0* 11.8*  HCT 43.9 38.0* 37.9*  MCV 93.6 96.0 92.7  PLT 211 155 186    Medications: I have reviewed the patient's current medications. Scheduled Meds: . amiodarone  200 mg Oral Daily  . ciprofloxacin  400 mg Intravenous Q12H  . enoxaparin (LOVENOX) injection  1 mg/kg Subcutaneous Q12H  . insulin aspart  0-9 Units Subcutaneous Q4H  . insulin glargine  8 Units Subcutaneous QHS  . metronidazole  500 mg  Intravenous Q8H   Continuous Infusions: . 0.9 % NaCl with KCl 40 mEq / L 125 mL/hr (11/12/13 0654)   PRN Meds:.morphine injection, ondansetron (ZOFRAN) IV, ondansetron, sodium chloride, zolpidem  Assessment/Plan: 78 year old man with history of chronic pancreatitis presenting with abdominal pain/distention, nausea, vomiting, worsened baseline diarrhea. CT with diffuse distention of the colon. Differential diagnosis include infection/colitis, mechanical bowel obstruction (adhesive disease), ischemic colitis, neoplasm. Abdominal distention improved and resolving ileus.   Recommendations:  -Continue conservative management. If patient tolerates PO, can go home from GI standpoint later today.  Griffin BasilJennifer Tyler Robidoux, MD 11/12/2013, 9:06 AM

## 2013-11-12 NOTE — Progress Notes (Signed)
Discharge Note. Reviewed discharge instructions and medications with pt's wife. Pt is hard of hearing and did not seem to be listening to discharge instructions. Pt's wife verbalized understanding and reported that she really like the GI doctor that pt had here and plans on changing to him. Pt's wife reported she didn't have any questions at this time. Pt ready for discharge.

## 2013-11-12 NOTE — Consult Note (Signed)
Patient seen and examined with Dr. Krall. Agree with her findings. Refer to my consult note for my complete findings.  

## 2013-11-12 NOTE — Progress Notes (Signed)
Patient ID: Joshua BarretteWilliam C Kawashima, male   DOB: 07/16/1935, 78 y.o.   MRN: [id number]  Reviewed and agree with Dr. Joaquin MusicKrall's note and assessment. Will sign off. Call if questions.

## 2013-11-13 LAB — GI PATHOGEN PANEL BY PCR, STOOL
C difficile toxin A/B: NEGATIVE
CAMPYLOBACTER BY PCR: NEGATIVE
CRYPTOSPORIDIUM BY PCR: NEGATIVE
E coli (ETEC) LT/ST: NEGATIVE
E coli (STEC): NEGATIVE
E coli 0157 by PCR: NEGATIVE
G lamblia by PCR: NEGATIVE
Norovirus GI/GII: NEGATIVE
Rotavirus A by PCR: NEGATIVE
Salmonella by PCR: NEGATIVE
Shigella by PCR: NEGATIVE

## 2013-12-10 ENCOUNTER — Telehealth: Payer: Self-pay | Admitting: Cardiovascular Disease

## 2013-12-10 MED ORDER — METOPROLOL SUCCINATE ER 25 MG PO TB24: 12.5000 mg | ORAL_TABLET | Freq: Every day | ORAL | Status: AC

## 2013-12-10 NOTE — Telephone Encounter (Signed)
Pt still have not received his Metoprolol. Please call it in today to [phone].

## 2013-12-10 NOTE — Telephone Encounter (Signed)
Spoke with patient wife and let her know the Rx was sent into the [facility] .

## 2014-01-19 ENCOUNTER — Inpatient Hospital Stay (HOSPITAL_COMMUNITY)
Admission: EM | Admit: 2014-01-19 | Discharge: 2014-01-24 | DRG: 389 | Disposition: A | Discharge status: Home / Self Care | Payer: Medicare Other | Attending: Internal Medicine | Admitting: Internal Medicine

## 2014-01-19 ENCOUNTER — Encounter (HOSPITAL_COMMUNITY): Payer: Self-pay | Admitting: Emergency Medicine

## 2014-01-19 ENCOUNTER — Emergency Department (HOSPITAL_COMMUNITY): Payer: Medicare Other

## 2014-01-19 DIAGNOSIS — K861 Other chronic pancreatitis: Secondary | ICD-10-CM | POA: Diagnosis not present

## 2014-01-19 DIAGNOSIS — K5669 Other intestinal obstruction: Secondary | ICD-10-CM | POA: Diagnosis present

## 2014-01-19 DIAGNOSIS — Z7901 Long term (current) use of anticoagulants: Secondary | ICD-10-CM | POA: Diagnosis not present

## 2014-01-19 DIAGNOSIS — Z794 Long term (current) use of insulin: Secondary | ICD-10-CM

## 2014-01-19 DIAGNOSIS — I482 Chronic atrial fibrillation, unspecified: Secondary | ICD-10-CM

## 2014-01-19 DIAGNOSIS — K5939 Other megacolon: Secondary | ICD-10-CM

## 2014-01-19 DIAGNOSIS — D62 Acute posthemorrhagic anemia: Secondary | ICD-10-CM

## 2014-01-19 DIAGNOSIS — F3289 Other specified depressive episodes: Secondary | ICD-10-CM

## 2014-01-19 DIAGNOSIS — I4891 Unspecified atrial fibrillation: Secondary | ICD-10-CM | POA: Diagnosis present

## 2014-01-19 DIAGNOSIS — I10 Essential (primary) hypertension: Secondary | ICD-10-CM | POA: Diagnosis present

## 2014-01-19 DIAGNOSIS — R140 Abdominal distension (gaseous): Secondary | ICD-10-CM

## 2014-01-19 DIAGNOSIS — Z87891 Personal history of nicotine dependence: Secondary | ICD-10-CM | POA: Diagnosis not present

## 2014-01-19 DIAGNOSIS — K593 Megacolon, not elsewhere classified: Secondary | ICD-10-CM | POA: Diagnosis present

## 2014-01-19 DIAGNOSIS — F32A Depression, unspecified: Secondary | ICD-10-CM | POA: Diagnosis present

## 2014-01-19 DIAGNOSIS — E1151 Type 2 diabetes mellitus with diabetic peripheral angiopathy without gangrene: Secondary | ICD-10-CM | POA: Diagnosis not present

## 2014-01-19 DIAGNOSIS — E876 Hypokalemia: Secondary | ICD-10-CM

## 2014-01-19 DIAGNOSIS — E785 Hyperlipidemia, unspecified: Secondary | ICD-10-CM | POA: Diagnosis not present

## 2014-01-19 DIAGNOSIS — R58 Hemorrhage, not elsewhere classified: Secondary | ICD-10-CM

## 2014-01-19 DIAGNOSIS — L8990 Pressure ulcer of unspecified site, unspecified stage: Secondary | ICD-10-CM

## 2014-01-19 DIAGNOSIS — I739 Peripheral vascular disease, unspecified: Secondary | ICD-10-CM | POA: Diagnosis not present

## 2014-01-19 DIAGNOSIS — E1165 Type 2 diabetes mellitus with hyperglycemia: Secondary | ICD-10-CM

## 2014-01-19 DIAGNOSIS — F329 Major depressive disorder, single episode, unspecified: Secondary | ICD-10-CM | POA: Diagnosis present

## 2014-01-19 DIAGNOSIS — E039 Hypothyroidism, unspecified: Secondary | ICD-10-CM | POA: Diagnosis present

## 2014-01-19 DIAGNOSIS — I69354 Hemiplegia and hemiparesis following cerebral infarction affecting left non-dominant side: Secondary | ICD-10-CM | POA: Diagnosis not present

## 2014-01-19 DIAGNOSIS — Z993 Dependence on wheelchair: Secondary | ICD-10-CM | POA: Diagnosis not present

## 2014-01-19 DIAGNOSIS — IMO0002 Reserved for concepts with insufficient information to code with codable children: Secondary | ICD-10-CM | POA: Diagnosis present

## 2014-01-19 DIAGNOSIS — K567 Ileus, unspecified: Secondary | ICD-10-CM

## 2014-01-19 DIAGNOSIS — E7800 Pure hypercholesterolemia, unspecified: Secondary | ICD-10-CM

## 2014-01-19 HISTORY — DX: Other chronic pancreatitis: K86.1

## 2014-01-19 HISTORY — DX: Dependence on supplemental oxygen: Z99.81

## 2014-01-19 LAB — COMPREHENSIVE METABOLIC PANEL
ALBUMIN: 3 g/dL — AB (ref 3.5–5.2)
ALT: 35 U/L (ref 0–53)
ANION GAP: 15 (ref 5–15)
AST: 32 U/L (ref 0–37)
Alkaline Phosphatase: 101 U/L (ref 39–117)
BILIRUBIN TOTAL: 0.6 mg/dL (ref 0.3–1.2)
BUN: 18 mg/dL (ref 6–23)
CALCIUM: 8.7 mg/dL (ref 8.4–10.5)
CHLORIDE: 102 meq/L (ref 96–112)
CO2: 22 mEq/L (ref 19–32)
Creatinine, Ser: 0.91 mg/dL (ref 0.50–1.35)
GFR calc Af Amer: >90 mL/min (ref >90)
GFR calc non Af Amer: 81 mL/min — ABNORMAL LOW (ref >90)
Glucose, Bld: 118 mg/dL — ABNORMAL HIGH (ref 70–99)
Potassium: 3.1 mEq/L — ABNORMAL LOW (ref 3.7–5.3)
Sodium: 139 mEq/L (ref 137–147)
Total Protein: 7.3 g/dL (ref 6.0–8.3)

## 2014-01-19 LAB — CBC WITH DIFFERENTIAL/PLATELET
BASOS PCT: 1 % (ref 0–1)
Basophils Absolute: 0.1 10*3/uL (ref 0.0–0.1)
EOS ABS: 0.1 10*3/uL (ref 0.0–0.7)
EOS PCT: 1 % (ref 0–5)
HEMATOCRIT: 42.9 % (ref 39.0–52.0)
Hemoglobin: 14.3 g/dL (ref 13.0–17.0)
Lymphocytes Relative: 22 % (ref 12–46)
Lymphs Abs: 2.3 10*3/uL (ref 0.7–4.0)
MCH: 30.5 pg (ref 26.0–34.0)
MCHC: 33.3 g/dL (ref 30.0–36.0)
MCV: 91.5 fL (ref 78.0–100.0)
MONO ABS: 1 10*3/uL (ref 0.1–1.0)
Monocytes Relative: 9 % (ref 3–12)
NEUTROS ABS: 7.2 10*3/uL (ref 1.7–7.7)
Neutrophils Relative %: 67 % (ref 43–77)
Platelets: 171 10*3/uL (ref 150–400)
RBC: 4.69 MIL/uL (ref 4.22–5.81)
RDW: 14.3 % (ref 11.5–15.5)
WBC: 10.6 10*3/uL — ABNORMAL HIGH (ref 4.0–10.5)

## 2014-01-19 LAB — GLUCOSE, CAPILLARY
GLUCOSE-CAPILLARY: 95 mg/dL (ref 70–99)
GLUCOSE-CAPILLARY: 84 mg/dL (ref 70–99)
Glucose-Capillary: 81 mg/dL (ref 70–99)

## 2014-01-19 LAB — POC OCCULT BLOOD, ED: Fecal Occult Bld: POSITIVE — AB

## 2014-01-19 LAB — TSH: TSH: 0.957 u[IU]/mL (ref 0.350–4.500)

## 2014-01-19 LAB — MRSA PCR SCREENING: MRSA by PCR: POSITIVE — AB

## 2014-01-19 LAB — LIPASE, BLOOD: LIPASE: 8 U/L — AB (ref 11–59)

## 2014-01-19 MED ORDER — INSULIN ASPART 100 UNIT/ML ~~LOC~~ SOLN: 0.0000 [IU] | Freq: Three times a day (TID) | SUBCUTANEOUS | Status: DC

## 2014-01-19 MED ORDER — IOHEXOL 300 MG/ML  SOLN: 25.0000 mL | INTRAMUSCULAR | Status: DC

## 2014-01-19 MED ORDER — AMIODARONE HCL 200 MG PO TABS
200.0000 mg | ORAL_TABLET | Freq: Every day | ORAL | Status: DC
  Administered 2014-01-20 – 2014-01-24 (×5): 200 mg via ORAL
  Filled 2014-01-19 (×5): qty 1

## 2014-01-19 MED ORDER — ONDANSETRON HCL 4 MG/2ML IJ SOLN
4.0000 mg | Freq: Four times a day (QID) | INTRAMUSCULAR | Status: DC | PRN
  Filled 2014-01-19: qty 2

## 2014-01-19 MED ORDER — CHLORHEXIDINE GLUCONATE CLOTH 2 % EX PADS
6.0000 | MEDICATED_PAD | Freq: Every day | CUTANEOUS | Status: DC
  Administered 2014-01-20 – 2014-01-24 (×4): 6 via TOPICAL

## 2014-01-19 MED ORDER — AMITRIPTYLINE HCL 25 MG PO TABS
25.0000 mg | ORAL_TABLET | Freq: Every day | ORAL | Status: DC
  Administered 2014-01-19 – 2014-01-23 (×5): 25 mg via ORAL
  Filled 2014-01-19 (×6): qty 1

## 2014-01-19 MED ORDER — METOPROLOL SUCCINATE 12.5 MG HALF TABLET
12.5000 mg | ORAL_TABLET | Freq: Every day | ORAL | Status: DC
  Administered 2014-01-20 – 2014-01-24 (×5): 12.5 mg via ORAL
  Filled 2014-01-19 (×5): qty 1

## 2014-01-19 MED ORDER — POTASSIUM CHLORIDE CRYS ER 20 MEQ PO TBCR
20.0000 meq | EXTENDED_RELEASE_TABLET | Freq: Two times a day (BID) | ORAL | Status: DC
  Administered 2014-01-19 – 2014-01-20 (×2): 20 meq via ORAL
  Filled 2014-01-19 (×4): qty 1

## 2014-01-19 MED ORDER — ACETAMINOPHEN 650 MG RE SUPP: 650.0000 mg | Freq: Four times a day (QID) | RECTAL | Status: DC | PRN

## 2014-01-19 MED ORDER — ACETAMINOPHEN 325 MG PO TABS
650.0000 mg | ORAL_TABLET | Freq: Four times a day (QID) | ORAL | Status: DC | PRN
  Filled 2014-01-19: qty 2

## 2014-01-19 MED ORDER — ONDANSETRON HCL 4 MG PO TABS: 4.0000 mg | ORAL_TABLET | Freq: Four times a day (QID) | ORAL | Status: DC | PRN

## 2014-01-19 MED ORDER — HYDROCODONE-ACETAMINOPHEN 5-325 MG PO TABS: 1.0000 | ORAL_TABLET | Freq: Four times a day (QID) | ORAL | Status: DC | PRN

## 2014-01-19 MED ORDER — ADULT MULTIVITAMIN W/MINERALS CH
1.0000 | ORAL_TABLET | Freq: Every morning | ORAL | Status: DC
  Administered 2014-01-20 – 2014-01-24 (×5): 1 via ORAL
  Filled 2014-01-19 (×5): qty 1

## 2014-01-19 MED ORDER — CILOSTAZOL 50 MG PO TABS
50.0000 mg | ORAL_TABLET | Freq: Two times a day (BID) | ORAL | Status: DC
  Administered 2014-01-19 – 2014-01-24 (×10): 50 mg via ORAL
  Filled 2014-01-19 (×11): qty 1

## 2014-01-19 MED ORDER — APIXABAN 5 MG PO TABS
5.0000 mg | ORAL_TABLET | Freq: Two times a day (BID) | ORAL | Status: DC
  Administered 2014-01-19 – 2014-01-24 (×10): 5 mg via ORAL
  Filled 2014-01-19 (×11): qty 1

## 2014-01-19 MED ORDER — GUAIFENESIN-DM 100-10 MG/5ML PO SYRP
5.0000 mL | ORAL_SOLUTION | ORAL | Status: DC | PRN
  Filled 2014-01-19: qty 5

## 2014-01-19 MED ORDER — CHLORHEXIDINE GLUCONATE 0.12 % MT SOLN
15.0000 mL | Freq: Two times a day (BID) | OROMUCOSAL | Status: DC
  Administered 2014-01-19 – 2014-01-24 (×8): 15 mL via OROMUCOSAL
  Filled 2014-01-19 (×13): qty 15

## 2014-01-19 MED ORDER — ALBUTEROL SULFATE (2.5 MG/3ML) 0.083% IN NEBU: 2.5000 mg | INHALATION_SOLUTION | RESPIRATORY_TRACT | Status: DC | PRN

## 2014-01-19 MED ORDER — POTASSIUM CHLORIDE IN NACL 40-0.9 MEQ/L-% IV SOLN
INTRAVENOUS | Status: DC
  Administered 2014-01-19 – 2014-01-20 (×3): 75 mL/h via INTRAVENOUS
  Filled 2014-01-19 (×5): qty 1000

## 2014-01-19 MED ORDER — LEVOTHYROXINE SODIUM 200 MCG PO TABS
200.0000 ug | ORAL_TABLET | Freq: Every day | ORAL | Status: DC
  Administered 2014-01-20 – 2014-01-24 (×5): 200 ug via ORAL
  Filled 2014-01-19 (×6): qty 1

## 2014-01-19 MED ORDER — MUPIROCIN 2 % EX OINT
1.0000 "application " | TOPICAL_OINTMENT | Freq: Two times a day (BID) | CUTANEOUS | Status: DC
  Administered 2014-01-19 – 2014-01-24 (×9): 1 via NASAL
  Filled 2014-01-19: qty 22

## 2014-01-19 MED ORDER — CETYLPYRIDINIUM CHLORIDE 0.05 % MT LIQD
7.0000 mL | Freq: Two times a day (BID) | OROMUCOSAL | Status: DC
  Administered 2014-01-19 – 2014-01-23 (×2): 7 mL via OROMUCOSAL

## 2014-01-19 MED ORDER — MORPHINE SULFATE 2 MG/ML IJ SOLN
1.0000 mg | INTRAMUSCULAR | Status: DC | PRN
  Administered 2014-01-19 – 2014-01-22 (×2): 1 mg via INTRAVENOUS
  Filled 2014-01-19 (×2): qty 1

## 2014-01-19 MED ORDER — VENLAFAXINE HCL ER 75 MG PO CP24
75.0000 mg | ORAL_CAPSULE | Freq: Every day | ORAL | Status: DC
  Administered 2014-01-20 – 2014-01-24 (×5): 75 mg via ORAL
  Filled 2014-01-19 (×6): qty 1

## 2014-01-19 MED ORDER — PANCRELIPASE (LIP-PROT-AMYL) 36000-114000 UNITS PO CPEP
36000.0000 [IU] | ORAL_CAPSULE | Freq: Three times a day (TID) | ORAL | Status: DC
  Administered 2014-01-21 – 2014-01-24 (×8): 36000 [IU] via ORAL
  Filled 2014-01-19 (×17): qty 1

## 2014-01-19 MED ORDER — IOHEXOL 300 MG/ML  SOLN
100.0000 mL | Freq: Once | INTRAMUSCULAR | Status: AC | PRN
  Administered 2014-01-19: 80 mL via INTRAVENOUS

## 2014-01-19 MED ORDER — ATORVASTATIN CALCIUM 40 MG PO TABS
40.0000 mg | ORAL_TABLET | Freq: Every morning | ORAL | Status: DC
  Administered 2014-01-20 – 2014-01-24 (×5): 40 mg via ORAL
  Filled 2014-01-19 (×5): qty 1

## 2014-01-19 MED ORDER — INSULIN GLARGINE 100 UNIT/ML ~~LOC~~ SOLN
8.0000 [IU] | Freq: Every day | SUBCUTANEOUS | Status: DC
  Filled 2014-01-19: qty 0.08

## 2014-01-19 MED ORDER — OXYCODONE HCL 5 MG PO TABS
5.0000 mg | ORAL_TABLET | ORAL | Status: DC | PRN
  Administered 2014-01-20 – 2014-01-22 (×4): 5 mg via ORAL
  Filled 2014-01-19 (×5): qty 1

## 2014-01-19 NOTE — ED Notes (Signed)
ED resident at bedside.

## 2014-01-19 NOTE — ED Notes (Signed)
IV team contacted for IV start.  

## 2014-01-19 NOTE — Progress Notes (Signed)
1645: Report received from MadisonMelissa, RN for admission [phone]

## 2014-01-19 NOTE — Progress Notes (Signed)
Joshua BarretteWilliam C Snow [id number] Code Status: Full Admission Data: 01/19/2014 5:50 PM Attending Provider:  ghimire NWG:NFAOZHY,QMVHQPCP:HAMRICK,MAURA L, MD Consults/ Treatment Team:    Joshua BarretteWilliam C Snow is a 78 y.o. male patient admitted from ED awake, alert - oriented  X 3 - no acute distress noted.  VSS - Blood pressure 133/83, pulse 61, temperature 97.8 F (36.6 C), temperature source Oral, resp. rate 16, height 5\' 8"  (1.727 m), weight 85.73 kg (189 lb), SpO2 98.00%.    IV in place, occlusive dsg intact without redness.  Orientation to room, and floor completed with information packet given to patient/family.  Patient declined safety video at this time.  Admission INP armband ID verified with patient/family, and in place.   SR up x 2, fall assessment complete, with patient and family able to verbalize understanding of risk associated with falls, and verbalized understanding to call nsg before up out of bed.  Call light within reach, patient able to voice, and demonstrate understanding.     Will cont to eval and treat per MD orders.  Aldean AstLEsperance, Dewel Lotter C, RN 01/19/2014 5:50 PM

## 2014-01-19 NOTE — ED Notes (Signed)
Phlebotomy at bedside.

## 2014-01-19 NOTE — ED Notes (Signed)
CT aware that pt is finished with contrast.  
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
## 2014-01-19 NOTE — Progress Notes (Signed)
Lab called. Patient MRSA PCR positive. Standing orders placed.

## 2014-01-19 NOTE — ED Notes (Signed)
IV team at bedside 

## 2014-01-19 NOTE — ED Notes (Signed)
Patient states abdominal pain and distention since yesterday.  Patient was in for same two weeks ago.
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
## 2014-01-20 ENCOUNTER — Inpatient Hospital Stay (HOSPITAL_COMMUNITY): Payer: Medicare Other

## 2014-01-20 DIAGNOSIS — R143 Flatulence: Secondary | ICD-10-CM

## 2014-01-20 DIAGNOSIS — L8990 Pressure ulcer of unspecified site, unspecified stage: Secondary | ICD-10-CM

## 2014-01-20 DIAGNOSIS — D62 Acute posthemorrhagic anemia: Secondary | ICD-10-CM

## 2014-01-20 DIAGNOSIS — R142 Eructation: Secondary | ICD-10-CM

## 2014-01-20 DIAGNOSIS — R58 Hemorrhage, not elsewhere classified: Secondary | ICD-10-CM

## 2014-01-20 DIAGNOSIS — R141 Gas pain: Secondary | ICD-10-CM

## 2014-01-20 LAB — GLUCOSE, CAPILLARY
GLUCOSE-CAPILLARY: 72 mg/dL (ref 70–99)
GLUCOSE-CAPILLARY: 113 mg/dL — AB (ref 70–99)
Glucose-Capillary: 75 mg/dL (ref 70–99)
Glucose-Capillary: 72 mg/dL (ref 70–99)
Glucose-Capillary: 91 mg/dL (ref 70–99)

## 2014-01-20 LAB — BASIC METABOLIC PANEL
Anion gap: 18 — ABNORMAL HIGH (ref 5–15)
BUN: 19 mg/dL (ref 6–23)
CHLORIDE: 106 meq/L (ref 96–112)
CO2: 20 meq/L (ref 19–32)
Calcium: 8.4 mg/dL (ref 8.4–10.5)
Creatinine, Ser: 0.81 mg/dL (ref 0.50–1.35)
GFR calc Af Amer: >90 mL/min (ref >90)
GFR, EST NON AFRICAN AMERICAN: 85 mL/min — AB (ref >90)
GLUCOSE: 69 mg/dL — AB (ref 70–99)
Potassium: 3.4 mEq/L — ABNORMAL LOW (ref 3.7–5.3)
SODIUM: 144 meq/L (ref 137–147)

## 2014-01-20 LAB — CBC
HCT: 40.3 % (ref 39.0–52.0)
HEMOGLOBIN: 13.1 g/dL (ref 13.0–17.0)
MCH: 30.2 pg (ref 26.0–34.0)
MCHC: 32.5 g/dL (ref 30.0–36.0)
MCV: 92.9 fL (ref 78.0–100.0)
Platelets: 149 10*3/uL — ABNORMAL LOW (ref 150–400)
RBC: 4.34 MIL/uL (ref 4.22–5.81)
RDW: 14.4 % (ref 11.5–15.5)
WBC: 9.6 10*3/uL (ref 4.0–10.5)

## 2014-01-20 LAB — MAGNESIUM: Magnesium: 2.1 mg/dL (ref 1.5–2.5)

## 2014-01-20 LAB — CLOSTRIDIUM DIFFICILE BY PCR: Toxigenic C. Difficile by PCR: NEGATIVE

## 2014-01-20 MED ORDER — INFLUENZA VAC SPLIT QUAD 0.5 ML IM SUSY
0.5000 mL | PREFILLED_SYRINGE | INTRAMUSCULAR | Status: AC
  Administered 2014-01-21: 0.5 mL via INTRAMUSCULAR
  Filled 2014-01-20: qty 0.5

## 2014-01-20 MED ORDER — INSULIN ASPART 100 UNIT/ML ~~LOC~~ SOLN
0.0000 [IU] | SUBCUTANEOUS | Status: DC
  Administered 2014-01-21: 1 [IU] via SUBCUTANEOUS
  Administered 2014-01-21 (×2): 2 [IU] via SUBCUTANEOUS
  Administered 2014-01-21: 3 [IU] via SUBCUTANEOUS
  Administered 2014-01-21: 2 [IU] via SUBCUTANEOUS
  Administered 2014-01-21 – 2014-01-22 (×4): 3 [IU] via SUBCUTANEOUS
  Administered 2014-01-22: 2 [IU] via SUBCUTANEOUS
  Administered 2014-01-22: 5 [IU] via SUBCUTANEOUS
  Administered 2014-01-22: 3 [IU] via SUBCUTANEOUS
  Administered 2014-01-23: 5 [IU] via SUBCUTANEOUS
  Administered 2014-01-23: 3 [IU] via SUBCUTANEOUS
  Administered 2014-01-23 (×2): 2 [IU] via SUBCUTANEOUS
  Administered 2014-01-23: 3 [IU] via SUBCUTANEOUS
  Administered 2014-01-23: 1 [IU] via SUBCUTANEOUS
  Administered 2014-01-24 (×3): 3 [IU] via SUBCUTANEOUS

## 2014-01-20 MED ORDER — POTASSIUM CHLORIDE CRYS ER 20 MEQ PO TBCR
40.0000 meq | EXTENDED_RELEASE_TABLET | Freq: Four times a day (QID) | ORAL | Status: AC
  Administered 2014-01-20: 40 meq via ORAL
  Filled 2014-01-20: qty 2

## 2014-01-20 NOTE — Progress Notes (Signed)
Utilization review completed.  

## 2014-01-20 NOTE — Progress Notes (Signed)
Pt refused potassium chloride scheduled for 2300. Pt educated about the importance of receiving the medication. Pt also refused to be repositioned. Will continue to monitor.
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
## 2014-01-20 NOTE — Progress Notes (Signed)
[facility] PROGRESS NOTE   Arby BarretteWilliam C Metzinger WUJ:[id number] DOB: 09/21/1935 DOA: 01/19/2014 PCP: Ailene RavelHAMRICK,MAURA L, MD  HPI/Subjective: Has mild discomfort, denies severe abdominal pain.  Assessment/Plan: Principal Problem:   Ileus Active Problems:   Depression   Uncontrolled type 2 diabetes mellitus with peripheral circulatory disorder   Hypokalemia   Atrial fibrillation   Hypercholesterolemia    . 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 -CT scan showed no changes since last study. -Presented with worsening of diarrhea, C. difficile is negative. -Per GI check ova/parasites, stool cultures and Giardia -Keep potassium above 4.  . Atrial fibrillation  -Monitor in telemetry, cautiously continue with amiodarone and Eliquis.  -If Procedures are required may need to discontinue Eliquis.   . Depression  - Stable  - Continue with venlafaxine   . Hypercholesterolemia  - Stable, continue with statin   . Hypokalemia  - Likely secondary to GI loss (chronic diarrhea) and Lasix   . Uncontrolled type 2 diabetes mellitus with peripheral circulatory disorder  -Discontinue Lantus, CBGs every 4 hours as patient is n.p.o.   Code Status: Full code Family Communication: Plan discussed with the patient. Disposition Plan: Remains inpatient   Consultants:  None  Procedures:  None  Antibiotics:  None   Objective: Filed Vitals:   01/20/14 1034  BP: 120/60  Pulse: 60  Temp:   Resp:     Intake/Output Summary (Last 24 hours) at 01/20/14 1432 Last data filed at 01/20/14 1049  Gross per 24 hour  Intake 301.25 ml  Output    325 ml  Net -23.75 ml   Filed Weights   01/19/14 1729  Weight: 85.73 kg (189 lb)    Exam: General: Alert and awake, oriented x3, not in any acute distress. HEENT: anicteric sclera, pupils reactive to light and accommodation, EOMI CVS: S1-S2 clear, no murmur rubs or gallops Chest: clear to auscultation  bilaterally, no wheezing, rales or rhonchi Abdomen: soft nontender, nondistended, normal bowel sounds, no organomegaly Extremities: no cyanosis, clubbing or edema noted bilaterally Neuro: Cranial nerves II-XII intact, no focal neurological deficits  Data Reviewed: Basic Metabolic Panel:  Recent Labs Lab 01/19/14 1137 01/20/14 1053  NA 139 144  K 3.1* 3.4*  CL 102 106  CO2 22 20  GLUCOSE 118* 69*  BUN 18 19  CREATININE 0.91 0.81  CALCIUM 8.7 8.4  MG  --  2.1   Liver Function Tests:  Recent Labs Lab 01/19/14 1137  AST 32  ALT 35  ALKPHOS 101  BILITOT 0.6  PROT 7.3  ALBUMIN 3.0*    Recent Labs Lab 01/19/14 1137  LIPASE 8*   No results found for this basename: AMMONIA,  in the last 168 hours CBC:  Recent Labs Lab 01/19/14 1137 01/20/14 1053  WBC 10.6* 9.6  NEUTROABS 7.2  --   HGB 14.3 13.1  HCT 42.9 40.3  MCV 91.5 92.9  PLT 171 149*   Cardiac Enzymes: No results found for this basename: CKTOTAL, CKMB, CKMBINDEX, TROPONINI,  in the last 168 hours BNP (last 3 results) No results found for this basename: PROBNP,  in the last 8760 hours CBG:  Recent Labs Lab 01/19/14 2131 01/19/14 2315 01/20/14 0359 01/20/14 0901 01/20/14 1252  GLUCAP 84 81 75 72 72    Micro Recent Results (from the past 240 hour(s))  MRSA PCR SCREENING     Status: Abnormal   Collection Time    01/19/14  5:33 PM      Result Value Ref  Range Status   MRSA by PCR POSITIVE (*) NEGATIVE Final   Comment:            The GeneXpert MRSA Assay (FDA     approved for NASAL specimens     only), is one component of a     comprehensive MRSA colonization     surveillance program. It is not     intended to diagnose MRSA     infection nor to guide or     monitor treatment for     MRSA infections.     RESULT CALLED TO, READ BACK BY AND VERIFIED WITH:     A THOMPSON RN 1958 01/19/14 A BROWNING  CLOSTRIDIUM DIFFICILE BY PCR     Status: None   Collection Time    01/19/14  6:47 PM       Result Value Ref Range Status   C difficile by pcr NEGATIVE  NEGATIVE Final     Studies: Ct Abdomen Pelvis W Contrast  01/19/2014   CLINICAL DATA:  78 year old male with abdominal distension and pain. Left lower abdominal pain since last night. Initial encounter.  EXAM: CT ABDOMEN AND PELVIS WITH CONTRAST  TECHNIQUE: Multidetector CT imaging of the abdomen and pelvis was performed using the standard protocol following bolus administration of intravenous contrast.  CONTRAST:  80mL OMNIPAQUE IOHEXOL 300 MG/ML  SOLN  COMPARISON:  11/10/2013 and earlier.  FINDINGS: Irregular peribronchovascular markings at both lung bases have not significantly changed. Stable cardiomegaly. No pericardial or pleural effusion. Small gas containing hiatal hernia.  Osteopenia. Widespread mild spinal compression fractures. Moderate to severe chronic fractures status post augmentation at L4 and L5. Superimposed degenerative osseous changes and chronic left hip arthroplasty. No acute osseous abnormality identified.  Streak artifact through the pelvis from the left hip hardware. Gas and stool in the rectum appearing similar to the study in July. Mild widespread large bowel wall thickening and diffusely distended large bowel loops are re - identified with a severely redundant colon. Mostly the colonic distention is related to gas. Occasionally there is low-density stool and fluid within the colon. The distension continues to the cecum. The appendix is stable without definite surrounding inflammation. Terminal ileum is unremarkable. As before no small bowel loops are dilated. The colonic distension has not significantly changed (right colon up to 9 cm diameter).  Oral contrast has not yet reached the mid or distal small bowel. The stomach is largely decompressed. The duodenum is decompressed.  Stable liver, gallbladder, spleen, pancreas (fatty infiltrated), adrenal glands, and kidneys. Portal venous system is patent. Extensive Aortoiliac  calcified atherosclerosis noted. With distal aorta to bilateral external iliac artery bypass, it native aortoiliac bifurcation and stented proximal iliac arteries occluded as before. Stable patency of the major abdominal aortic branches. No abdominal free fluid or free air. No lymphadenopathy identified.  IMPRESSION: No acute findings in the abdomen or pelvis. Continued diffuse dilatation of the colon not significantly changed since July and most suggestive of chronic colonic inertia.   Electronically Signed   By: Augusto Gamble M.D.   On: 01/19/2014 14:36   Acute Abdominal Series  01/20/2014   CLINICAL DATA:  Abdominal distention, abdominal pain.  EXAM: ACUTE ABDOMEN SERIES (ABDOMEN 2 VIEW & CHEST 1 VIEW)  COMPARISON:  11/10/2013  FINDINGS: Diffuse gaseous distention of large and small bowel. No change since prior study. No free air. No organomegaly.  Vascular stents noted in the region of the iliac vessels. Prior vertebral augmentation in the lower lumbar and  upper sacral spine.  Heart is borderline in size. No confluent airspace opacities or effusions.  IMPRESSION: Stable moderate to severe diffuse gaseous distention of bowel compatible with chronic ileus. No change.  No acute cardiopulmonary disease.   Electronically Signed   By: Charlett Nose M.D.   On: 01/20/2014 08:33    Scheduled Meds: . amiodarone  200 mg Oral Daily  . amitriptyline  25 mg Oral QHS  . antiseptic oral rinse  7 mL Mouth Rinse q12n4p  . apixaban  5 mg Oral BID  . atorvastatin  40 mg Oral q morning - 10a  . chlorhexidine  15 mL Mouth Rinse BID  . Chlorhexidine Gluconate Cloth  6 each Topical Q0600  . cilostazol  50 mg Oral BID  . [START ON 01/21/2014] Influenza vac split quadrivalent PF  0.5 mL Intramuscular Tomorrow-1000  . insulin aspart  0-9 Units Subcutaneous 6 times per day  . levothyroxine  200 mcg Oral QAC breakfast  . lipase/protease/amylase  36,000 Units Oral TID AC  . metoprolol succinate  12.5 mg Oral Daily  .  multivitamin with minerals  1 tablet Oral q morning - 10a  . mupirocin ointment  1 application Nasal BID  . potassium chloride SA  20 mEq Oral BID  . venlafaxine XR  75 mg Oral Q breakfast   Continuous Infusions: . 0.9 % NaCl with KCl 40 mEq / L 75 mL/hr (01/20/14 0714)       Time spent: 35 minutes    [facility]  [facility] Pager [phone] If 7PM-7AM, please contact night-coverage at [email or web address], password [facility] 01/20/2014, 2:32 PM  LOS: 1 day

## 2014-01-20 NOTE — Progress Notes (Addendum)
Pt CBG 81 and NPO. Has scheduled lantus 8 units ordered. Paged provider on call to see if ok for lantus dose to be given.  0045 - orders placed .

## 2014-01-20 NOTE — ED Provider Notes (Signed)
I saw and evaluated the patient, reviewed the resident's note and I agree with the findings and plan.   EKG Interpretation None     Patient with abdominal pain. Some distention. History same with colonic ileus. CT scan shows similar dilatation no but without wall changes. Continued pain and will admit  Juliet Rudeathan R. Rubin PayorPickering, MD 01/20/14 [phone]

## 2014-01-21 ENCOUNTER — Inpatient Hospital Stay (HOSPITAL_COMMUNITY): Payer: Medicare Other

## 2014-01-21 DIAGNOSIS — E876 Hypokalemia: Secondary | ICD-10-CM

## 2014-01-21 DIAGNOSIS — L8990 Pressure ulcer of unspecified site, unspecified stage: Secondary | ICD-10-CM

## 2014-01-21 DIAGNOSIS — D62 Acute posthemorrhagic anemia: Secondary | ICD-10-CM

## 2014-01-21 DIAGNOSIS — I482 Chronic atrial fibrillation: Secondary | ICD-10-CM

## 2014-01-21 DIAGNOSIS — R140 Abdominal distension (gaseous): Secondary | ICD-10-CM

## 2014-01-21 DIAGNOSIS — K593 Megacolon, not elsewhere classified: Secondary | ICD-10-CM

## 2014-01-21 DIAGNOSIS — K5669 Other intestinal obstruction: Secondary | ICD-10-CM | POA: Diagnosis not present

## 2014-01-21 LAB — BASIC METABOLIC PANEL
ANION GAP: 19 — AB (ref 5–15)
BUN: 20 mg/dL (ref 6–23)
CO2: 14 meq/L — AB (ref 19–32)
Calcium: 8.2 mg/dL — ABNORMAL LOW (ref 8.4–10.5)
Chloride: 114 mEq/L — ABNORMAL HIGH (ref 96–112)
Creatinine, Ser: 0.86 mg/dL (ref 0.50–1.35)
GFR, EST NON AFRICAN AMERICAN: 83 mL/min — AB (ref >90)
Glucose, Bld: 181 mg/dL — ABNORMAL HIGH (ref 70–99)
Potassium: 4 mEq/L (ref 3.7–5.3)
Sodium: 147 mEq/L (ref 137–147)

## 2014-01-21 LAB — URINALYSIS, ROUTINE W REFLEX MICROSCOPIC
GLUCOSE, UA: 100 mg/dL — AB
Leukocytes, UA: NEGATIVE
Nitrite: NEGATIVE
Protein, ur: 30 mg/dL — AB
Specific Gravity, Urine: 1.018 (ref 1.005–1.030)
Urobilinogen, UA: 0.2 mg/dL (ref 0.0–1.0)
pH: 5.5 (ref 5.0–8.0)

## 2014-01-21 LAB — URINE MICROSCOPIC-ADD ON

## 2014-01-21 LAB — GLUCOSE, CAPILLARY
GLUCOSE-CAPILLARY: 149 mg/dL — AB (ref 70–99)
Glucose-Capillary: 167 mg/dL — ABNORMAL HIGH (ref 70–99)
Glucose-Capillary: 167 mg/dL — ABNORMAL HIGH (ref 70–99)
Glucose-Capillary: 168 mg/dL — ABNORMAL HIGH (ref 70–99)
Glucose-Capillary: 205 mg/dL — ABNORMAL HIGH (ref 70–99)
Glucose-Capillary: 208 mg/dL — ABNORMAL HIGH (ref 70–99)
Glucose-Capillary: 218 mg/dL — ABNORMAL HIGH (ref 70–99)
Glucose-Capillary: 234 mg/dL — ABNORMAL HIGH (ref 70–99)

## 2014-01-21 LAB — CBC
HCT: 38 % — ABNORMAL LOW (ref 39.0–52.0)
Hemoglobin: 11.9 g/dL — ABNORMAL LOW (ref 13.0–17.0)
MCH: 29.2 pg (ref 26.0–34.0)
MCHC: 31.3 g/dL (ref 30.0–36.0)
MCV: 93.4 fL (ref 78.0–100.0)
PLATELETS: 147 10*3/uL — AB (ref 150–400)
RBC: 4.07 MIL/uL — ABNORMAL LOW (ref 4.22–5.81)
RDW: 14.7 % (ref 11.5–15.5)
WBC: 9.4 10*3/uL (ref 4.0–10.5)

## 2014-01-21 MED ORDER — SACCHAROMYCES BOULARDII 250 MG PO CAPS
250.0000 mg | ORAL_CAPSULE | Freq: Two times a day (BID) | ORAL | Status: DC
  Administered 2014-01-21 – 2014-01-24 (×7): 250 mg via ORAL
  Filled 2014-01-21 (×8): qty 1

## 2014-01-21 MED ORDER — DEXTROSE-NACL 5-0.45 % IV SOLN
INTRAVENOUS | Status: DC
  Administered 2014-01-21 – 2014-01-22 (×2): via INTRAVENOUS
  Filled 2014-01-21 (×3): qty 1000

## 2014-01-21 NOTE — Progress Notes (Signed)
[facility] PROGRESS NOTE   Joshua BarretteWilliam C Snow ZOX:[id number] DOB: 05/22/1935 DOA: 01/19/2014 PCP: Ailene RavelHAMRICK,MAURA L, MD  HPI/Subjective: Abdominal pain is improving, no abdominal distention.  Assessment/Plan: Principal Problem:   Ileus Active Problems:   Depression   Uncontrolled type 2 diabetes mellitus with peripheral circulatory disorder   Hypokalemia   Atrial fibrillation   Hypercholesterolemia    . 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 -CT scan showed no changes since last study. -Presented with worsening of diarrhea, C. difficile is negative. -Per GI check ova/parasites, stool cultures and Giardia -Keep potassium above 4.   Metabolic acidosis -With bicarbonate of 14, could be secondary to diarrhea and multiple loose stools. -Hopefully this will improve when the diarrhea improves. - check urinalysis to rule out infectious causes.  . Atrial fibrillation  -Monitor in telemetry, cautiously continue with amiodarone and Eliquis.  -If Procedures are required may need to discontinue Eliquis.   . Depression  - Stable  - Continue with venlafaxine   . Hypercholesterolemia  - Stable, continue with statin   . Hypokalemia  - Likely secondary to GI loss (chronic diarrhea) and Lasix   . Uncontrolled type 2 diabetes mellitus with peripheral circulatory disorder  -Discontinue Lantus, CBGs every 4 hours as patient is n.p.o.   Code Status: Full code Family Communication: Plan discussed with the patient. Disposition Plan: Remains inpatient   Consultants:  None  Procedures:  None  Antibiotics:  None   Objective: Filed Vitals:   01/21/14 1013  BP: 128/60  Pulse: 60  Temp:   Resp:     Intake/Output Summary (Last 24 hours) at 01/21/14 1320 Last data filed at 01/21/14 0902  Gross per 24 hour  Intake      0 ml  Output   2670 ml  Net  -2670 ml   Filed Weights   01/19/14 1729  Weight: 85.73 kg (189 lb)     Exam: General: Alert and awake, oriented x3, not in any acute distress. HEENT: anicteric sclera, pupils reactive to light and accommodation, EOMI CVS: S1-S2 clear, no murmur rubs or gallops Chest: clear to auscultation bilaterally, no wheezing, rales or rhonchi Abdomen: soft nontender, nondistended, normal bowel sounds, no organomegaly Extremities: no cyanosis, clubbing or edema noted bilaterally Neuro: Cranial nerves II-XII intact, no focal neurological deficits  Data Reviewed: Basic Metabolic Panel:  Recent Labs Lab 01/19/14 1137 01/20/14 1053 01/21/14 0708  NA 139 144 147  K 3.1* 3.4* 4.0  CL 102 106 114*  CO2 22 20 14*  GLUCOSE 118* 69* 181*  BUN 18 19 20   CREATININE 0.91 0.81 0.86  CALCIUM 8.7 8.4 8.2*  MG  --  2.1  --    Liver Function Tests:  Recent Labs Lab 01/19/14 1137  AST 32  ALT 35  ALKPHOS 101  BILITOT 0.6  PROT 7.3  ALBUMIN 3.0*    Recent Labs Lab 01/19/14 1137  LIPASE 8*   No results found for this basename: AMMONIA,  in the last 168 hours CBC:  Recent Labs Lab 01/19/14 1137 01/20/14 1053 01/21/14 0708  WBC 10.6* 9.6 9.4  NEUTROABS 7.2  --   --   HGB 14.3 13.1 11.9*  HCT 42.9 40.3 38.0*  MCV 91.5 92.9 93.4  PLT 171 149* 147*   Cardiac Enzymes: No results found for this basename: CKTOTAL, CKMB, CKMBINDEX, TROPONINI,  in the last 168 hours BNP (last 3 results) No results found for this basename: PROBNP,  in the last 8760 hours CBG:  Recent Labs Lab 01/20/14 2012 01/21/14 0027 01/21/14 0353 01/21/14 0745 01/21/14 1202  GLUCAP 113* 149* 167* 168* 167*    Micro Recent Results (from the past 240 hour(s))  MRSA PCR SCREENING     Status: Abnormal   Collection Time    01/19/14  5:33 PM      Result Value Ref Range Status   MRSA by PCR POSITIVE (*) NEGATIVE Final   Comment:            The GeneXpert MRSA Assay (FDA     approved for NASAL specimens     only), is one component of a     comprehensive MRSA colonization      surveillance program. It is not     intended to diagnose MRSA     infection nor to guide or     monitor treatment for     MRSA infections.     RESULT CALLED TO, READ BACK BY AND VERIFIED WITH:     A THOMPSON RN 1958 01/19/14 A BROWNING  CLOSTRIDIUM DIFFICILE BY PCR     Status: None   Collection Time    01/19/14  6:47 PM      Result Value Ref Range Status   C difficile by pcr NEGATIVE  NEGATIVE Final     Studies: Ct Abdomen Pelvis W Contrast  01/19/2014   CLINICAL DATA:  78 year old male with abdominal distension and pain. Left lower abdominal pain since last night. Initial encounter.  EXAM: CT ABDOMEN AND PELVIS WITH CONTRAST  TECHNIQUE: Multidetector CT imaging of the abdomen and pelvis was performed using the standard protocol following bolus administration of intravenous contrast.  CONTRAST:  80mL OMNIPAQUE IOHEXOL 300 MG/ML  SOLN  COMPARISON:  11/10/2013 and earlier.  FINDINGS: Irregular peribronchovascular markings at both lung bases have not significantly changed. Stable cardiomegaly. No pericardial or pleural effusion. Small gas containing hiatal hernia.  Osteopenia. Widespread mild spinal compression fractures. Moderate to severe chronic fractures status post augmentation at L4 and L5. Superimposed degenerative osseous changes and chronic left hip arthroplasty. No acute osseous abnormality identified.  Streak artifact through the pelvis from the left hip hardware. Gas and stool in the rectum appearing similar to the study in July. Mild widespread large bowel wall thickening and diffusely distended large bowel loops are re - identified with a severely redundant colon. Mostly the colonic distention is related to gas. Occasionally there is low-density stool and fluid within the colon. The distension continues to the cecum. The appendix is stable without definite surrounding inflammation. Terminal ileum is unremarkable. As before no small bowel loops are dilated. The colonic distension has not  significantly changed (right colon up to 9 cm diameter).  Oral contrast has not yet reached the mid or distal small bowel. The stomach is largely decompressed. The duodenum is decompressed.  Stable liver, gallbladder, spleen, pancreas (fatty infiltrated), adrenal glands, and kidneys. Portal venous system is patent. Extensive Aortoiliac calcified atherosclerosis noted. With distal aorta to bilateral external iliac artery bypass, it native aortoiliac bifurcation and stented proximal iliac arteries occluded as before. Stable patency of the major abdominal aortic branches. No abdominal free fluid or free air. No lymphadenopathy identified.  IMPRESSION: No acute findings in the abdomen or pelvis. Continued diffuse dilatation of the colon not significantly changed since July and most suggestive of chronic colonic inertia.   Electronically Signed   By: Augusto Gamble M.D.   On: 01/19/2014 14:36   Acute Abdominal Series  01/20/2014   CLINICAL DATA:  Abdominal distention, abdominal pain.  EXAM: ACUTE ABDOMEN SERIES (ABDOMEN 2 VIEW & CHEST 1 VIEW)  COMPARISON:  11/10/2013  FINDINGS: Diffuse gaseous distention of large and small bowel. No change since prior study. No free air. No organomegaly.  Vascular stents noted in the region of the iliac vessels. Prior vertebral augmentation in the lower lumbar and upper sacral spine.  Heart is borderline in size. No confluent airspace opacities or effusions.  IMPRESSION: Stable moderate to severe diffuse gaseous distention of bowel compatible with chronic ileus. No change.  No acute cardiopulmonary disease.   Electronically Signed   By: Charlett Nose M.D.   On: 01/20/2014 08:33    Scheduled Meds: . amiodarone  200 mg Oral Daily  . amitriptyline  25 mg Oral QHS  . antiseptic oral rinse  7 mL Mouth Rinse q12n4p  . apixaban  5 mg Oral BID  . atorvastatin  40 mg Oral q morning - 10a  . chlorhexidine  15 mL Mouth Rinse BID  . Chlorhexidine Gluconate Cloth  6 each Topical Q0600  .  cilostazol  50 mg Oral BID  . insulin aspart  0-9 Units Subcutaneous 6 times per day  . levothyroxine  200 mcg Oral QAC breakfast  . lipase/protease/amylase  36,000 Units Oral TID AC  . metoprolol succinate  12.5 mg Oral Daily  . multivitamin with minerals  1 tablet Oral q morning - 10a  . mupirocin ointment  1 application Nasal BID  . venlafaxine XR  75 mg Oral Q breakfast   Continuous Infusions: . 0.9 % NaCl with KCl 40 mEq / L 75 mL/hr (01/20/14 1854)       Time spent: 35 minutes    [facility]  [facility] Pager [phone] If 7PM-7AM, please contact night-coverage at [email or web address], password [facility] 01/21/2014, 1:20 PM  LOS: 2 days

## 2014-01-21 NOTE — Progress Notes (Signed)
Pt refused CHG wipes. Pt educated. Will continue to monitor.

## 2014-01-22 DIAGNOSIS — F329 Major depressive disorder, single episode, unspecified: Secondary | ICD-10-CM

## 2014-01-22 LAB — OVA AND PARASITE EXAMINATION: OVA AND PARASITES: NONE SEEN

## 2014-01-22 LAB — GLUCOSE, CAPILLARY
GLUCOSE-CAPILLARY: 207 mg/dL — AB (ref 70–99)
GLUCOSE-CAPILLARY: 246 mg/dL — AB (ref 70–99)
Glucose-Capillary: 227 mg/dL — ABNORMAL HIGH (ref 70–99)
Glucose-Capillary: 194 mg/dL — ABNORMAL HIGH (ref 70–99)
Glucose-Capillary: 286 mg/dL — ABNORMAL HIGH (ref 70–99)

## 2014-01-22 LAB — BASIC METABOLIC PANEL
ANION GAP: 12 (ref 5–15)
BUN: 11 mg/dL (ref 6–23)
CALCIUM: 8.7 mg/dL (ref 8.4–10.5)
CO2: 22 meq/L (ref 19–32)
Chloride: 116 mEq/L — ABNORMAL HIGH (ref 96–112)
Creatinine, Ser: 0.69 mg/dL (ref 0.50–1.35)
GFR calc non Af Amer: 90 mL/min — ABNORMAL LOW (ref >90)
Glucose, Bld: 196 mg/dL — ABNORMAL HIGH (ref 70–99)
Potassium: 3.1 mEq/L — ABNORMAL LOW (ref 3.7–5.3)
SODIUM: 150 meq/L — AB (ref 137–147)

## 2014-01-22 MED ORDER — POTASSIUM CHLORIDE 2 MEQ/ML IV SOLN
INTRAVENOUS | Status: DC
  Administered 2014-01-22 – 2014-01-24 (×3): via INTRAVENOUS
  Filled 2014-01-22 (×3): qty 1000

## 2014-01-22 MED ORDER — INSULIN GLARGINE 100 UNIT/ML ~~LOC~~ SOLN
16.0000 [IU] | Freq: Every day | SUBCUTANEOUS | Status: DC
  Administered 2014-01-22 – 2014-01-23 (×2): 16 [IU] via SUBCUTANEOUS
  Filled 2014-01-22 (×3): qty 0.16

## 2014-01-22 MED ORDER — INSULIN GLARGINE 100 UNIT/ML ~~LOC~~ SOLN: 5.0000 [IU] | Freq: Every day | SUBCUTANEOUS | Status: DC

## 2014-01-22 MED ORDER — POTASSIUM CHLORIDE CRYS ER 20 MEQ PO TBCR
40.0000 meq | EXTENDED_RELEASE_TABLET | Freq: Two times a day (BID) | ORAL | Status: AC
  Administered 2014-01-22 (×2): 40 meq via ORAL
  Filled 2014-01-22 (×2): qty 2

## 2014-01-22 MED ORDER — SODIUM CHLORIDE 0.45 % IV SOLN
INTRAVENOUS | Status: DC
  Filled 2014-01-22: qty 1000

## 2014-01-22 NOTE — Progress Notes (Signed)
[facility] PROGRESS NOTE   SAIR FAULCON UJW:[id number] DOB: 1935-05-18 DOA: 01/19/2014 PCP: [name], MD  HPI/Subjective: Patient indicates has has some abdominal pain but he is hungry and wants to eat.  Assessment/Plan: Principal Problem:   Ileus Active Problems:   Depression   Uncontrolled type 2 diabetes mellitus with peripheral circulatory disorder   Hypokalemia   Atrial fibrillation   Hypercholesterolemia    . 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 -CT scan showed no changes since last study. -Appreciate [facility] Consultation. Per GI check ova/parasites, stool cultures and Giardia.  Keep potassium above 4. -Distention improved 10/2.  Advanced diet to clears.   Metabolic acidosis / electrolyte abnormalities. -Secondary to GI losses. -On 10/1 bicarbonate of 14 -On 10/2 bicarbonate is 22.  Metabolic acidosis resolved. -Mildly hypernatremic and hypoglycemic on 10/2.  Will replete K and change fluids to D5w with K  At 50 ml/hour.  . Atrial fibrillation  -Stable. Rate controlled. -continue with amiodarone and Eliquis.  -If Procedures are required may need to discontinue Eliquis.   . Depression  - Stable  - Continue with venlafaxine   . Hypercholesterolemia  - Stable, continue with statin   . Uncontrolled type 2 diabetes mellitus with peripheral circulatory disorder  -Add back lantus at 16 units QHS as patient is now on D5 and clear liquids.   Code Status: Full code Family Communication: Plan discussed with the patient. Disposition Plan: Remains inpatient   Consultants:  [facility]  Procedures:  None  Antibiotics:  None   Objective: Filed Vitals:   01/22/14 1300  BP: 125/51  Pulse: 77  Temp: 98.1 F (36.7 C)  Resp: 24    Intake/Output Summary (Last 24 hours) at 01/22/14 1516 Last data filed at 01/22/14 0023  Gross per 24 hour  Intake      0 ml  Output    300 ml  Net   -300 ml   Filed  Weights   01/19/14 1729  Weight: 85.73 kg (189 lb)    Exam: General: Alert and awake, oriented x3, not in any acute distress. HEENT: anicteric sclera, pupils reactive to light and accommodation, EOMI.  Left eye fixed. CVS: S1-S2 clear, no murmur rubs or gallops Chest: clear to auscultation bilaterally, no wheezing, rales or rhonchi Abdomen: soft nontender, nondistended, normal bowel sounds, no organomegaly Extremities: no cyanosis, clubbing or edema noted bilaterally.  Patient has left hemiparesis.   Data Reviewed: Basic Metabolic Panel:  Recent Labs Lab 01/19/14 1137 01/20/14 1053 01/21/14 0708 01/22/14 1000  NA 139 144 147 150*  K 3.1* 3.4* 4.0 3.1*  CL 102 106 114* 116*  CO2 22 20 14* 22  GLUCOSE 118* 69* 181* 196*  BUN 18 19 20 11   CREATININE 0.91 0.81 0.86 0.69  CALCIUM 8.7 8.4 8.2* 8.7  MG  --  2.1  --   --    Liver Function Tests:  Recent Labs Lab 01/19/14 1137  AST 32  ALT 35  ALKPHOS 101  BILITOT 0.6  PROT 7.3  ALBUMIN 3.0*    Recent Labs Lab 01/19/14 1137  LIPASE 8*   CBC:  Recent Labs Lab 01/19/14 1137 01/20/14 1053 01/21/14 0708  WBC 10.6* 9.6 9.4  NEUTROABS 7.2  --   --   HGB 14.3 13.1 11.9*  HCT 42.9 40.3 38.0*  MCV 91.5 92.9 93.4  PLT 171 149* 147*   CBG:  Recent Labs Lab 01/21/14 2111 01/21/14 2349 01/22/14 0326 01/22/14 0752 01/22/14  1211  GLUCAP 208* 234* 227* 207* 194*    Micro Recent Results (from the past 240 hour(s))  MRSA PCR SCREENING     Status: Abnormal   Collection Time    01/19/14  5:33 PM      Result Value Ref Range Status   MRSA by PCR POSITIVE (*) NEGATIVE Final   Comment:            The GeneXpert MRSA Assay (FDA     approved for NASAL specimens     only), is one component of a     comprehensive MRSA colonization     surveillance program. It is not     intended to diagnose MRSA     infection nor to guide or     monitor treatment for     MRSA infections.     RESULT CALLED TO, READ BACK BY AND  VERIFIED WITH:     A THOMPSON RN 1958 01/19/14 A BROWNING  CLOSTRIDIUM DIFFICILE BY PCR     Status: None   Collection Time    01/19/14  6:47 PM      Result Value Ref Range Status   C difficile by pcr NEGATIVE  NEGATIVE Final  STOOL CULTURE     Status: None   Collection Time    01/21/14  9:02 AM      Result Value Ref Range Status   Specimen Description PERIRECTAL   Final   Special Requests NONE   Final   Culture     Final   Value: Culture reincubated for better growth     Performed at [facility]   Report Status PENDING   Incomplete     Studies: Dg Abd 2 Views  01/21/2014   CLINICAL DATA:  Abdominal distention.  EXAM: ABDOMEN - 2 VIEW  COMPARISON:  01/20/2014  FINDINGS: Gaseous distension of the large and small bowel loops are identified. When compared with the previous exam the degree of bowel distention is stable to slightly improved in the interval. No free intraperitoneal air identified.  IMPRESSION: Stable to improved appearance of bowel distention.   Electronically Signed   By: Signa Kell M.D.   On: 01/21/2014 15:34    Scheduled Meds: . amiodarone  200 mg Oral Daily  . amitriptyline  25 mg Oral QHS  . antiseptic oral rinse  7 mL Mouth Rinse q12n4p  . apixaban  5 mg Oral BID  . atorvastatin  40 mg Oral q morning - 10a  . chlorhexidine  15 mL Mouth Rinse BID  . Chlorhexidine Gluconate Cloth  6 each Topical Q0600  . cilostazol  50 mg Oral BID  . insulin aspart  0-9 Units Subcutaneous 6 times per day  . insulin glargine  16 Units Subcutaneous QHS  . levothyroxine  200 mcg Oral QAC breakfast  . lipase/protease/amylase  36,000 Units Oral TID AC  . metoprolol succinate  12.5 mg Oral Daily  . multivitamin with minerals  1 tablet Oral q morning - 10a  . mupirocin ointment  1 application Nasal BID  . potassium chloride  40 mEq Oral BID  . saccharomyces boulardii  250 mg Oral BID  . venlafaxine XR  75 mg Oral Q breakfast   Continuous Infusions: . dextrose 5 % 1,000  mL with potassium chloride 20 mEq infusion 50 mL/hr at 01/22/14 1443     Time spent: 35 minutes  Conley Canal  [facility] Pager [phone] If 7PM-7AM, please contact night-coverage at [email or web address],  password [email or web address] 01/22/2014, 3:16 PM  LOS: 3 days

## 2014-01-22 NOTE — Progress Notes (Signed)
Medicare Important Message given?  YES (If response is "NO", the following Medicare IM given date fields will be blank) Date Medicare IM given:  01/22/2014 Medicare IM given by:  Litisha Guagliardo 

## 2014-01-22 NOTE — Progress Notes (Signed)
Subjective: No abdominal pain. Is hungry.  Objective: Vital signs in last 24 hours: Temp:  [97.6 F (36.4 C)-98.1 F (36.7 C)] 97.7 F (36.5 C) (10/02 0807) Pulse Rate:  [60-79] 75 (10/02 0807) Resp:  [16-20] 20 (10/02 0540) BP: (112-130)/(32-60) 130/51 mmHg (10/02 0807) SpO2:  [95 %-99 %] 98 % (10/02 0807) Weight change:  Last BM Date: 01/20/14  PE: GEN:  Elderly, alert, pleasant, NAD ABD:  Soft, mild distention, mild tympany, bowel sounds present.  Lab Results:  Studies/Results: Abd xray 01/21/14 (personally reviewed):  Improved chronic dilation of colon  Assessment:  1.  Ileus, in background of chronic colonic inertia, improving.  Plan:  1.  Continue supportive management. 2.  Agree with clear liquids, advance diet slowly as tolerated. 3.  Don't see need for serial xrays, unless clinical status worsens. 4.  Hold pancreatic enzymes, and restart once he resumes solid foods.  Freddy JakschUTLAW,Issai M 01/22/2014, 8:46 AM

## 2014-01-22 NOTE — Progress Notes (Signed)
Addendum  Patient seen and examined, chart and data base reviewed.  I agree with the above assessment and plan.  For full details please see Mrs. Algis DownsMarianne York PA note.  Metabolic acidosis likely secondary to diarrhea, resolved.  Atonic distended colon, advanced diet to clear liquids. Keep potassium > 4.   Clint LippsMutaz A Loyola Santino, MD [facility] Pager: [phone] 01/22/2014, 3:51 PM

## 2014-01-23 DIAGNOSIS — K567 Ileus, unspecified: Secondary | ICD-10-CM

## 2014-01-23 LAB — GLUCOSE, CAPILLARY
GLUCOSE-CAPILLARY: 178 mg/dL — AB (ref 70–99)
GLUCOSE-CAPILLARY: 228 mg/dL — AB (ref 70–99)
Glucose-Capillary: 135 mg/dL — ABNORMAL HIGH (ref 70–99)
Glucose-Capillary: 161 mg/dL — ABNORMAL HIGH (ref 70–99)
Glucose-Capillary: 232 mg/dL — ABNORMAL HIGH (ref 70–99)
Glucose-Capillary: 254 mg/dL — ABNORMAL HIGH (ref 70–99)

## 2014-01-23 LAB — BASIC METABOLIC PANEL
Anion gap: 11 (ref 5–15)
BUN: 7 mg/dL (ref 6–23)
CO2: 20 mEq/L (ref 19–32)
Calcium: 8.3 mg/dL — ABNORMAL LOW (ref 8.4–10.5)
Chloride: 113 mEq/L — ABNORMAL HIGH (ref 96–112)
Creatinine, Ser: 0.65 mg/dL (ref 0.50–1.35)
GFR calc Af Amer: >90 mL/min (ref >90)
Glucose, Bld: 150 mg/dL — ABNORMAL HIGH (ref 70–99)
POTASSIUM: 3.6 meq/L — AB (ref 3.7–5.3)
SODIUM: 144 meq/L (ref 137–147)

## 2014-01-23 MED ORDER — TAMSULOSIN HCL 0.4 MG PO CAPS
0.4000 mg | ORAL_CAPSULE | Freq: Every day | ORAL | Status: DC
  Administered 2014-01-23 – 2014-01-24 (×2): 0.4 mg via ORAL
  Filled 2014-01-23 (×2): qty 1

## 2014-01-23 MED ORDER — MAGNESIUM SULFATE 40 MG/ML IJ SOLN
2.0000 g | Freq: Once | INTRAMUSCULAR | Status: AC
  Administered 2014-01-23: 2 g via INTRAVENOUS
  Filled 2014-01-23: qty 50

## 2014-01-23 MED ORDER — POTASSIUM CHLORIDE CRYS ER 20 MEQ PO TBCR
40.0000 meq | EXTENDED_RELEASE_TABLET | Freq: Two times a day (BID) | ORAL | Status: AC
  Administered 2014-01-23 (×2): 40 meq via ORAL
  Filled 2014-01-23 (×2): qty 2

## 2014-01-23 NOTE — Progress Notes (Signed)
[facility] PROGRESS NOTE   Joshua BarretteWilliam C Snow [id number] DOB: 07/11/1935 DOA: 01/19/2014 PCP: Ailene RavelHAMRICK,MAURA L, MD  HPI/Subjective: Wants to eat, denies abdominal pain, denies any nausea vomiting.  Assessment/Plan: Principal Problem:   Ileus Active Problems:   Depression   Uncontrolled type 2 diabetes mellitus with peripheral circulatory disorder   Hypokalemia   Atrial fibrillation   Hypercholesterolemia    . 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 -CT scan showed no changes since last study. -Appreciate [facility] Consultation. Per GI check ova/parasites, stool cultures and Giardia.  Keep potassium above 4. -Tolerated clears without any problems, advanced to soft diet and discontinue rectal tube.   Metabolic acidosis / electrolyte abnormalities. -Secondary to GI losses. -On 10/1 bicarbonate of 14 -On 10/2 bicarbonate is 22.  Metabolic acidosis resolved. -Mildly hypernatremic and hypoglycemic on 10/2.  Will replete K and change fluids to D5w with K  At 50 ml/hour.  . Atrial fibrillation  -Stable. Rate controlled. -continue with amiodarone and Eliquis.  -If Procedures are required may need to discontinue Eliquis.   . Depression  - Stable  - Continue with venlafaxine   . Hypercholesterolemia  - Stable, continue with statin   . Uncontrolled type 2 diabetes mellitus with peripheral circulatory disorder  -Add back lantus at 16 units QHS as patient is now on D5 and clear liquids.   Positive Hemoccult Seen by GI, positive Hemoccult of unclear significance, no of his bleeding. Per GI if bleeding worse can probably consider intervention   Code Status: Full code Family Communication: Plan discussed with the patient. Disposition Plan: Remains inpatient   Consultants:  [facility]  Procedures:  None  Antibiotics:  None   Objective: Filed Vitals:   01/23/14 0602  BP: 117/54  Pulse: 67  Temp: 98.3 F (36.8 C)    Resp: 20    Intake/Output Summary (Last 24 hours) at 01/23/14 1112 Last data filed at 01/23/14 0700  Gross per 24 hour  Intake    585 ml  Output   1425 ml  Net   -840 ml   Filed Weights   01/19/14 1729  Weight: 85.73 kg (189 lb)    Exam: General: Alert and awake, oriented x3, not in any acute distress. HEENT: anicteric sclera, pupils reactive to light and accommodation, EOMI.  Left eye fixed. CVS: S1-S2 clear, no murmur rubs or gallops Chest: clear to auscultation bilaterally, no wheezing, rales or rhonchi Abdomen: soft nontender, nondistended, normal bowel sounds, no organomegaly Extremities: no cyanosis, clubbing or edema noted bilaterally.  Patient has left hemiparesis.   Data Reviewed: Basic Metabolic Panel:  Recent Labs Lab 01/19/14 1137 01/20/14 1053 01/21/14 0708 01/22/14 1000 01/23/14 0630  NA 139 144 147 150* 144  K 3.1* 3.4* 4.0 3.1* 3.6*  CL 102 106 114* 116* 113*  CO2 22 20 14* 22 20  GLUCOSE 118* 69* 181* 196* 150*  BUN 18 19 20 11 7   CREATININE 0.91 0.81 0.86 0.69 0.65  CALCIUM 8.7 8.4 8.2* 8.7 8.3*  MG  --  2.1  --   --   --    Liver Function Tests:  Recent Labs Lab 01/19/14 1137  AST 32  ALT 35  ALKPHOS 101  BILITOT 0.6  PROT 7.3  ALBUMIN 3.0*    Recent Labs Lab 01/19/14 1137  LIPASE 8*   CBC:  Recent Labs Lab 01/19/14 1137 01/20/14 1053 01/21/14 0708  WBC 10.6* 9.6 9.4  NEUTROABS 7.2  --   --  HGB 14.3 13.1 11.9*  HCT 42.9 40.3 38.0*  MCV 91.5 92.9 93.4  PLT 171 149* 147*   CBG:  Recent Labs Lab 01/22/14 1633 01/22/14 2001 01/22/14 2358 01/23/14 0411 01/23/14 0835  GLUCAP 286* 246* 228* 135* 161*    Micro Recent Results (from the past 240 hour(s))  MRSA PCR SCREENING     Status: Abnormal   Collection Time    01/19/14  5:33 PM      Result Value Ref Range Status   MRSA by PCR POSITIVE (*) NEGATIVE Final   Comment:            The GeneXpert MRSA Assay (FDA     approved for NASAL specimens     only), is  one component of a     comprehensive MRSA colonization     surveillance program. It is not     intended to diagnose MRSA     infection nor to guide or     monitor treatment for     MRSA infections.     RESULT CALLED TO, READ BACK BY AND VERIFIED WITH:     A THOMPSON RN 1958 01/19/14 A BROWNING  CLOSTRIDIUM DIFFICILE BY PCR     Status: None   Collection Time    01/19/14  6:47 PM      Result Value Ref Range Status   C difficile by pcr NEGATIVE  NEGATIVE Final  STOOL CULTURE     Status: None   Collection Time    01/21/14  9:02 AM      Result Value Ref Range Status   Specimen Description PERIRECTAL   Final   Special Requests NONE   Final   Culture     Final   Value: Culture reincubated for better growth     Performed at [facility]   Report Status PENDING   Incomplete  OVA AND PARASITE EXAMINATION     Status: None   Collection Time    01/21/14  9:02 AM      Result Value Ref Range Status   Specimen Description PERIRECTAL   Final   Special Requests NONE   Final   Ova and parasites     Final   Value: NO OVA OR PARASITES SEEN     Performed at [facility]   Report Status 01/22/2014 FINAL   Final     Studies: Dg Abd 2 Views  01/21/2014   CLINICAL DATA:  Abdominal distention.  EXAM: ABDOMEN - 2 VIEW  COMPARISON:  01/20/2014  FINDINGS: Gaseous distension of the large and small bowel loops are identified. When compared with the previous exam the degree of bowel distention is stable to slightly improved in the interval. No free intraperitoneal air identified.  IMPRESSION: Stable to improved appearance of bowel distention.   Electronically Signed   By: Signa Kell M.D.   On: 01/21/2014 15:34    Scheduled Meds: . amiodarone  200 mg Oral Daily  . amitriptyline  25 mg Oral QHS  . antiseptic oral rinse  7 mL Mouth Rinse q12n4p  . apixaban  5 mg Oral BID  . atorvastatin  40 mg Oral q morning - 10a  . chlorhexidine  15 mL Mouth Rinse BID  . Chlorhexidine Gluconate Cloth   6 each Topical Q0600  . cilostazol  50 mg Oral BID  . insulin aspart  0-9 Units Subcutaneous 6 times per day  . insulin glargine  16 Units Subcutaneous QHS  . levothyroxine  200 mcg Oral QAC breakfast  . lipase/protease/amylase  36,000 Units Oral TID AC  . metoprolol succinate  12.5 mg Oral Daily  . multivitamin with minerals  1 tablet Oral q morning - 10a  . mupirocin ointment  1 application Nasal BID  . potassium chloride  40 mEq Oral BID  . saccharomyces boulardii  250 mg Oral BID  . venlafaxine XR  75 mg Oral Q breakfast   Continuous Infusions: . dextrose 5 % 1,000 mL with potassium chloride 20 mEq infusion 50 mL/hr at 01/23/14 1610     Time spent: 35 minutes  Conley Canal  [facility] Pager [phone] If 7PM-7AM, please contact night-coverage at [email or web address], password [email or web address] 01/23/2014, 11:12 AM  LOS: 4 days

## 2014-01-23 NOTE — Progress Notes (Signed)
Pt unable to void, was cathed at 0630 for 550 cc.  Notified Dr. Arthor CaptainElmahi, order received to cath q8 hrs and prn.  Bladder scan for >265, I & O cathed for 260 cc amber urine.

## 2014-01-23 NOTE — Progress Notes (Signed)
Patient ID: [name] [place], male   DOB: 12/10/1935, 78 y.o.   MRN: [id number] [facility] Progress Note  [name] Timmons 78 y.o. 05/16/1935   Subjective: Feels better. Wants to eat a steak and go home. Denies abdominal pain. Denies N/V. Wife at bedside.  Objective: Vital signs in last 24 hours: Filed Vitals:   01/23/14 0602  BP: 117/54  Pulse: 67  Temp: 98.3 F (36.8 Snow)  Resp: 20    Physical Exam: Gen: elderly, frail, alert, no acute distress Abd: soft, nontender, nondistended, +BS  Lab Results:  Recent Labs  01/20/14 1053  01/22/14 1000 01/23/14 0630  NA 144  < > 150* 144  K 3.4*  < > 3.1* 3.6*  CL 106  < > 116* 113*  CO2 20  < > 22 20  GLUCOSE 69*  < > 196* 150*  BUN 19  < > 11 7  CREATININE 0.81  < > 0.69 0.65  CALCIUM 8.4  < > 8.7 8.3*  MG 2.1  --   --   --   < > = values in this interval not displayed. No results found for this basename: AST, ALT, ALKPHOS, BILITOT, PROT, ALBUMIN,  in the last 72 hours  Recent Labs  01/20/14 1053 01/21/14 0708  WBC 9.6 9.4  HGB 13.1 11.9*  HCT 40.3 38.0*  MCV 92.9 93.4  PLT 149* 147*   No results found for this basename: LABPROT, INR,  in the last 72 hours    Assessment/Plan: Resolving Ileus - Change to soft diet. Advance as tolerated. Keep potassium in normal range. No further recs. Ok to go home from GI standpoint when he can tolerate solid food. F/U with Dr. Evette CristalGanem prn. Will sign off. Call if questions.   Tamaka Sawin Snow. 01/23/2014, 10:42 AM

## 2014-01-24 LAB — CBC
HCT: 35.2 % — ABNORMAL LOW (ref 39.0–52.0)
Hemoglobin: 11.6 g/dL — ABNORMAL LOW (ref 13.0–17.0)
MCH: 29.8 pg (ref 26.0–34.0)
MCHC: 33 g/dL (ref 30.0–36.0)
MCV: 90.5 fL (ref 78.0–100.0)
Platelets: 151 10*3/uL (ref 150–400)
RBC: 3.89 MIL/uL — AB (ref 4.22–5.81)
RDW: 14.9 % (ref 11.5–15.5)
WBC: 6.2 10*3/uL (ref 4.0–10.5)

## 2014-01-24 LAB — GLUCOSE, CAPILLARY
GLUCOSE-CAPILLARY: 182 mg/dL — AB (ref 70–99)
GLUCOSE-CAPILLARY: 235 mg/dL — AB (ref 70–99)
Glucose-Capillary: 209 mg/dL — ABNORMAL HIGH (ref 70–99)

## 2014-01-24 LAB — BASIC METABOLIC PANEL
Anion gap: 9 (ref 5–15)
BUN: 5 mg/dL — ABNORMAL LOW (ref 6–23)
CALCIUM: 7.9 mg/dL — AB (ref 8.4–10.5)
CO2: 21 meq/L (ref 19–32)
CREATININE: 0.63 mg/dL (ref 0.50–1.35)
Chloride: 109 mEq/L (ref 96–112)
GFR calc Af Amer: >90 mL/min (ref >90)
GLUCOSE: 242 mg/dL — AB (ref 70–99)
Potassium: 4 mEq/L (ref 3.7–5.3)
Sodium: 139 mEq/L (ref 137–147)

## 2014-01-24 LAB — MAGNESIUM: Magnesium: 2 mg/dL (ref 1.5–2.5)

## 2014-01-24 MED ORDER — INSULIN GLARGINE 100 UNIT/ML ~~LOC~~ SOLN: 20.0000 [IU] | Freq: Every day | SUBCUTANEOUS | Status: DC

## 2014-01-24 MED ORDER — SACCHAROMYCES BOULARDII 250 MG PO CAPS: 250.0000 mg | ORAL_CAPSULE | Freq: Two times a day (BID) | ORAL | Status: DC

## 2014-01-24 MED ORDER — TAMSULOSIN HCL 0.4 MG PO CAPS: 0.4000 mg | ORAL_CAPSULE | Freq: Every day | ORAL | Status: AC

## 2014-01-24 NOTE — Evaluation (Signed)
Physical Therapy Evaluation Patient Details Name: [name] MRN: [id number] DOB: 1935-06-22 Today's Date: 01/24/2014   History of Present Illness  [name] is a 78 y.o. male with a Past Medical History of CVA with left-sided hemiparesis wheelchair-bound, atrial fibrillation on Eliquis, diabetes, hypertension, chronic pancreatitis dyslipidemia who presents for abdominal pain with ileus  Clinical Impression  Pt pleasant but slightly resistant to cues for ease of transfer and need for anterior translation with standing and transfer to chair. Pt and wife present and stating home sequence throughout with pt always performing pivots to his right. Pt and wife educated for positioning and need for splint to left hand. Pt had PRAFOs but refused to wear them and at this point foot drop contracture would not be reversed without surgical intervention. Pt and wife would benefit from acute therapy to maximize strength and transfers to increase mobility and burden of care to return to baseline.     Follow Up Recommendations Home health PT    Equipment Recommendations  None recommended by PT    Recommendations for Other Services       Precautions / Restrictions Precautions Precautions: Fall Precaution Comments: left hemiplegia, left foot drop and hand contracture      Mobility  Bed Mobility Overal bed mobility: Needs Assistance Bed Mobility: Supine to Sit;Sit to Supine     Supine to sit: Mod assist;HOB elevated Sit to supine: Min assist   General bed mobility comments: cues for sequence with HOB 30degrees and increased assist to elevate trunk from bed. With return to bed pt able to lie in sidely with assist to fully bring bil LE onto surface  Transfers Overall transfer level: Needs assistance   Transfers: Sit to/from Stand;Stand Pivot Transfers Sit to Stand: Mod assist Stand pivot transfers: Mod assist       General transfer comment: cues for hand placement, anterior  translation and safety with assist for set up transfer bed<>WC.  Pt with posterior lean and flexed posture with difficulty translating anteriorly  Ambulation/Gait Ambulation/Gait assistance:  (non-ambulatory x 64yrs)              Stairs            Wheelchair Mobility    Modified Rankin (Stroke Patients Only)       Balance Overall balance assessment: Needs assistance   Sitting balance-Leahy Scale: Fair                                       Pertinent Vitals/Pain Pain Assessment: No/denies pain    Home Living Family/patient expects to be discharged to:: Private residence Living Arrangements: Spouse/significant other Available Help at Discharge: Family Type of Home: House Home Access: Level entry     Home Layout: One level Home Equipment: Wheelchair - manual;Hospital bed;Other (comment);Tub bench (trapeze bar)      Prior Function Level of Independence: Needs assistance   Gait / Transfers Assistance Needed: pt is typically min-mod assist for transfers in and out of bed and stand pivots bed<>WC<>lift chair  ADL's / Homemaking Assistance Needed: total assist for bathing and dressing which wife assists with in bed. Wears adult briefs with transfers to bed for all pericare        Hand Dominance        Extremity/Trunk Assessment   Upper Extremity Assessment: LUE deficits/detail;RUE deficits/detail RUE Deficits / Details: WFL     LUE Deficits /  Details: LUE with hemiplegia with right hand contracture   Lower Extremity Assessment: LLE deficits/detail;RLE deficits/detail RLE Deficits / Details: WFL LLE Deficits / Details: flaccid with foot drop  Cervical / Trunk Assessment: Kyphotic  Communication   Communication: HOH  Cognition Arousal/Alertness: Awake/alert Behavior During Therapy: Agitated Overall Cognitive Status: History of cognitive impairments - at baseline                      General Comments      Exercises         Assessment/Plan    PT Assessment Patient needs continued PT services  PT Diagnosis Generalized weakness   PT Problem List Decreased strength;Decreased activity tolerance;Decreased mobility  PT Treatment Interventions Functional mobility training;Therapeutic activities;Therapeutic exercise;Patient/family education   PT Goals (Current goals can be found in the Care Plan section) Acute Rehab PT Goals Patient Stated Goal: go home PT Goal Formulation: With patient/family Time For Goal Achievement: 01/31/14 Potential to Achieve Goals: Fair    Frequency Min 2X/week   Barriers to discharge        Co-evaluation               End of Session   Activity Tolerance: Patient tolerated treatment well Patient left: in bed;with call bell/phone within reach Nurse Communication: Mobility status;Precautions         Time: [id number] PT Time Calculation (min): 23 min   Charges:   PT Evaluation $Initial PT Evaluation Tier I: 1 Procedure PT Treatments $Therapeutic Activity: 8-22 mins   PT G CodesDelorse Lek:          Tabor, Reubin Bushnell Beth 01/24/2014, 1:19 PM  Delaney MeigsMaija Tabor Jessenya Berdan, PT [phone]

## 2014-01-24 NOTE — Progress Notes (Signed)
Pt is still unable to  void since last time straight catheterization done at 0227 pm , tried with urinal multiple times time with no effect. As per existing order In and out catheterization done with 375 ml urine output( amber colored). Will continue to monitor.

## 2014-01-24 NOTE — Discharge Planning (Signed)
Patient discharged home in stable condition. Pt and wife understanding of all discharge instructions, including home medications and follow up appointments.

## 2014-01-24 NOTE — Discharge Summary (Signed)
Physician Discharge Summary  Joshua BarretteWilliam C Snow UJW:[id number] DOB: 07/21/1935 DOA: 01/19/2014  PCP: Ailene RavelHAMRICK,MAURA L, MD  Admit date: 01/19/2014 Discharge date: 01/24/2014  Time spent: 40 minutes  Recommendations for Outpatient Follow-up:  1. Followup with primary care physician within one week. 2. Lantus insulin increased to 20 units each bedtime, Flomax added.  Discharge Diagnoses:  Principal Problem:   Ileus Active Problems:   Depression   Uncontrolled type 2 diabetes mellitus with peripheral circulatory disorder   Hypokalemia   Atrial fibrillation   Hypercholesterolemia   Discharge Condition: Stable  Diet recommendation: Carbohydrate modified diet  Filed Weights   01/19/14 1729  Weight: 85.73 kg (189 lb)    History of present illness:  Joshua BarretteWilliam C Snow is a 78 y.o. male with a Past Medical History of CVA with left-sided hemiparesis wheelchair-bound, atrial fibrillation on Eliquis, diabetes, hypertension, chronic pancreatitis dyslipidemia who presents for evaluation of the above-noted complaints. Please note, patient is a poor historian, most of this history is obtained from not only talking to the patient, but also patient's spouse. Apparently for the past 2-3 days, patient has had worsening abdominal distention associated with pain. Patient has 2-3 loose stools chronically (history of pancreatitis and on Creon), however for the past few days he thinks that he may have had more loose stools than usual. Apparently, patient's is significantly more distended than usual. He has had approximately 3-4 hour movements today, per spouse these have been unusually "large amounts". Not sure if he has had loose watery stools. No fever, no vomiting or nausea. He was brought to the emergency room, where a CT scan of the abdomen was identical to a CT scan done in July of this year. Please note in July of this year patient had ileus and was admitted for supportive care.  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  -CT scan showed no changes since last study.  -Appreciate [facility]. Recommended rectal tube to be placed -Stool studies including ova/parasites, stool culture and Giardia were negative..  -Keep potassium above 4, abdominal distention improved, rectal tube output of loose stools decreased. -Place on clear liquids initially and advance to soft diet since yesterday, patient did very okay, appropriate for D/C today.  Metabolic acidosis / electrolyte abnormalities. -Secondary to diarrhea and GI losses of bicarbonate. -On 10/1 bicarbonate of 14, on 10/2 bicarbonate is 22. Metabolic acidosis resolved.  -Mildly hypernatremic and hypoglycemic on 10/2. Will replete K and change fluids to D5w with K At 50 ml/hour.   Atrial fibrillation  -Stable. Rate controlled.  -continue with amiodarone and Eliquis.  -If Procedures are required may need to discontinue Eliquis.   Depression  - Stable  - Continue with venlafaxine   Hypercholesterolemia  - Stable, continue with statin   Uncontrolled type 2 diabetes mellitus with peripheral circulatory disorder  -Hemoglobin A1c is 8.0 which correlate with mean plasma glucose of 183. -Lantus insulin increased to 20 units on discharge.  Urinary retention -Patient has mildly limited attention when he was in the hospital and needed infrequent in and out catheterization. -Prior to discharge patient was able to pee on his own, Flomax added.   Procedures:  Insertion of rectal tube for 2 days  Consultations:  Gastroenterology  Discharge Exam: Filed Vitals:   01/24/14 0647  BP: 154/67  Pulse: 70  Temp: 98.6 F (37 C)  Resp: 18   General: Alert and awake, oriented x3, not in any acute distress. HEENT: anicteric  sclera, pupils reactive to light and accommodation, EOMI CVS: S1-S2 clear, no murmur rubs or gallops Chest: clear to auscultation bilaterally, no wheezing,  rales or rhonchi Abdomen: soft nontender, nondistended, normal bowel sounds, no organomegaly Extremities: no cyanosis, clubbing or edema noted bilaterally Neuro: Cranial nerves II-XII intact, no focal neurological deficits  Discharge Instructions You were cared for by a hospitalist during your hospital stay. If you have any questions about your discharge medications or the care you received while you were in the hospital after you are discharged, you can call the unit and asked to speak with the hospitalist on call if the hospitalist that took care of you is not available. Once you are discharged, your primary care physician will handle any further medical issues. Please note that NO REFILLS for any discharge medications will be authorized once you are discharged, as it is imperative that you return to your primary care physician (or establish a relationship with a primary care physician if you do not have one) for your aftercare needs so that they can reassess your need for medications and monitor your lab values.  Discharge Instructions   Diet Carb Modified    Complete by:  As directed      Increase activity slowly    Complete by:  As directed           Current Discharge Medication List    START taking these medications   Details  saccharomyces boulardii (FLORASTOR) 250 MG capsule Take 1 capsule (250 mg total) by mouth 2 (two) times daily. Qty: 60 capsule, Refills: 0    tamsulosin (FLOMAX) 0.4 MG CAPS capsule Take 1 capsule (0.4 mg total) by mouth daily. Qty: 30 capsule, Refills: 0      CONTINUE these medications which have CHANGED   Details  insulin glargine (LANTUS) 100 UNIT/ML injection Inject 0.2 mLs (20 Units total) into the skin at bedtime.      CONTINUE these medications which have NOT CHANGED   Details  amiodarone (PACERONE) 200 MG tablet Take 1 tablet (200 mg total) by mouth daily. Qty: 30 tablet, Refills: 6    amitriptyline (ELAVIL) 25 MG tablet Take 25 mg by mouth at  bedtime.     apixaban (ELIQUIS) 5 MG TABS tablet Take 1 tablet (5 mg total) by mouth 2 (two) times daily. Qty: 56 tablet, Refills: 0    atorvastatin (LIPITOR) 40 MG tablet Take 40 mg by mouth every morning.     cilostazol (PLETAL) 50 MG tablet Take 1 tablet (50 mg total) by mouth 2 (two) times daily. Qty: 60 tablet, Refills: 11    furosemide (LASIX) 20 MG tablet Take 20 mg by mouth every morning.     KLOR-CON M20 20 MEQ tablet Take 20 mEq by mouth 2 (two) times daily.     levothyroxine (SYNTHROID, LEVOTHROID) 200 MCG tablet Take 200 mcg by mouth daily before breakfast.    lipase/protease/amylase (CREON-12/PANCREASE) 12000 UNITS CPEP capsule Take 3 capsules by mouth 3 (three) times daily before meals.     metoprolol succinate (TOPROL-XL) 25 MG 24 hr tablet Take 0.5 tablets (12.5 mg total) by mouth daily. Qty: 15 tablet, Refills: 5    Multiple Vitamin (MULITIVITAMIN WITH MINERALS) TABS Take 1 tablet by mouth every morning.     OVER THE COUNTER MEDICATION Apply 1 application topically 4 (four) times daily as needed (Butt rash).    oxyCODONE-acetaminophen (PERCOCET) 10-325 MG per tablet Take 1 tablet by mouth 2 (two) times daily as needed.  traMADol (ULTRAM) 50 MG tablet Take 50 mg by mouth at bedtime.    venlafaxine XR (EFFEXOR-XR) 75 MG 24 hr capsule Take 75 mg by mouth daily with breakfast.       Allergies  Allergen Reactions  . Hydromorphone Hcl Other (See Comments)    "makes him crazy" per wife  . Penicillins Hives   Follow-up Information   Follow up with [facility] L, MD In 1 week.   Specialty:  Family Medicine   Contact information:   Dr. Burnell Blanks [address] [place] [address] [phone]        The results of significant diagnostics from this hospitalization (including imaging, microbiology, ancillary and laboratory) are listed below for reference.    Significant Diagnostic Studies: Ct Abdomen Pelvis W Contrast  01/19/2014    CLINICAL DATA:  78 year old male with abdominal distension and pain. Left lower abdominal pain since last night. Initial encounter.  EXAM: CT ABDOMEN AND PELVIS WITH CONTRAST  TECHNIQUE: Multidetector CT imaging of the abdomen and pelvis was performed using the standard protocol following bolus administration of intravenous contrast.  CONTRAST:  80mL OMNIPAQUE IOHEXOL 300 MG/ML  SOLN  COMPARISON:  11/10/2013 and earlier.  FINDINGS: Irregular peribronchovascular markings at both lung bases have not significantly changed. Stable cardiomegaly. No pericardial or pleural effusion. Small gas containing hiatal hernia.  Osteopenia. Widespread mild spinal compression fractures. Moderate to severe chronic fractures status post augmentation at L4 and L5. Superimposed degenerative osseous changes and chronic left hip arthroplasty. No acute osseous abnormality identified.  Streak artifact through the pelvis from the left hip hardware. Gas and stool in the rectum appearing similar to the study in July. Mild widespread large bowel wall thickening and diffusely distended large bowel loops are re - identified with a severely redundant colon. Mostly the colonic distention is related to gas. Occasionally there is low-density stool and fluid within the colon. The distension continues to the cecum. The appendix is stable without definite surrounding inflammation. Terminal ileum is unremarkable. As before no small bowel loops are dilated. The colonic distension has not significantly changed (right colon up to 9 cm diameter).  Oral contrast has not yet reached the mid or distal small bowel. The stomach is largely decompressed. The duodenum is decompressed.  Stable liver, gallbladder, spleen, pancreas (fatty infiltrated), adrenal glands, and kidneys. Portal venous system is patent. Extensive Aortoiliac calcified atherosclerosis noted. With distal aorta to bilateral external iliac artery bypass, it native aortoiliac bifurcation and stented  proximal iliac arteries occluded as before. Stable patency of the major abdominal aortic branches. No abdominal free fluid or free air. No lymphadenopathy identified.  IMPRESSION: No acute findings in the abdomen or pelvis. Continued diffuse dilatation of the colon not significantly changed since July and most suggestive of chronic colonic inertia.   Electronically Signed   By: Augusto Gamble M.D.   On: 01/19/2014 14:36   Dg Abd 2 Views  01/21/2014   CLINICAL DATA:  Abdominal distention.  EXAM: ABDOMEN - 2 VIEW  COMPARISON:  01/20/2014  FINDINGS: Gaseous distension of the large and small bowel loops are identified. When compared with the previous exam the degree of bowel distention is stable to slightly improved in the interval. No free intraperitoneal air identified.  IMPRESSION: Stable to improved appearance of bowel distention.   Electronically Signed   By: Signa Kell M.D.   On: 01/21/2014 15:34   Acute Abdominal Series  01/20/2014   CLINICAL DATA:  Abdominal distention, abdominal pain.  EXAM: ACUTE  ABDOMEN SERIES (ABDOMEN 2 VIEW & CHEST 1 VIEW)  COMPARISON:  11/10/2013  FINDINGS: Diffuse gaseous distention of large and small bowel. No change since prior study. No free air. No organomegaly.  Vascular stents noted in the region of the iliac vessels. Prior vertebral augmentation in the lower lumbar and upper sacral spine.  Heart is borderline in size. No confluent airspace opacities or effusions.  IMPRESSION: Stable moderate to severe diffuse gaseous distention of bowel compatible with chronic ileus. No change.  No acute cardiopulmonary disease.   Electronically Signed   By: Charlett Nose M.D.   On: 01/20/2014 08:33    Microbiology: Recent Results (from the past 240 hour(s))  MRSA PCR SCREENING     Status: Abnormal   Collection Time    01/19/14  5:33 PM      Result Value Ref Range Status   MRSA by PCR POSITIVE (*) NEGATIVE Final   Comment:            The GeneXpert MRSA Assay (FDA     approved for  NASAL specimens     only), is one component of a     comprehensive MRSA colonization     surveillance program. It is not     intended to diagnose MRSA     infection nor to guide or     monitor treatment for     MRSA infections.     RESULT CALLED TO, READ BACK BY AND VERIFIED WITH:     A THOMPSON RN 1958 01/19/14 A BROWNING  CLOSTRIDIUM DIFFICILE BY PCR     Status: None   Collection Time    01/19/14  6:47 PM      Result Value Ref Range Status   C difficile by pcr NEGATIVE  NEGATIVE Final  STOOL CULTURE     Status: None   Collection Time    01/21/14  9:02 AM      Result Value Ref Range Status   Specimen Description PERIRECTAL   Final   Special Requests NONE   Final   Culture     Final   Value: NO SUSPICIOUS COLONIES, CONTINUING TO HOLD     Performed at [facility]   Report Status PENDING   Incomplete  OVA AND PARASITE EXAMINATION     Status: None   Collection Time    01/21/14  9:02 AM      Result Value Ref Range Status   Specimen Description PERIRECTAL   Final   Special Requests NONE   Final   Ova and parasites     Final   Value: NO OVA OR PARASITES SEEN     Performed at [facility]   Report Status 01/22/2014 FINAL   Final     Labs: Basic Metabolic Panel:  Recent Labs Lab 01/20/14 1053 01/21/14 0708 01/22/14 1000 01/23/14 0630 01/24/14 0450  NA 144 147 150* 144 139  K 3.4* 4.0 3.1* 3.6* 4.0  CL 106 114* 116* 113* 109  CO2 20 14* 22 20 21   GLUCOSE 69* 181* 196* 150* 242*  BUN 19 20 11 7  5*  CREATININE 0.81 0.86 0.69 0.65 0.63  CALCIUM 8.4 8.2* 8.7 8.3* 7.9*  MG 2.1  --   --   --  2.0   Liver Function Tests:  Recent Labs Lab 01/19/14 1137  AST 32  ALT 35  ALKPHOS 101  BILITOT 0.6  PROT 7.3  ALBUMIN 3.0*    Recent Labs Lab 01/19/14 1137  LIPASE  8*   No results found for this basename: AMMONIA,  in the last 168 hours CBC:  Recent Labs Lab 01/19/14 1137 01/20/14 1053 01/21/14 0708 01/24/14 0450  WBC 10.6* 9.6 9.4 6.2   NEUTROABS 7.2  --   --   --   HGB 14.3 13.1 11.9* 11.6*  HCT 42.9 40.3 38.0* 35.2*  MCV 91.5 92.9 93.4 90.5  PLT 171 149* 147* 151   Cardiac Enzymes: No results found for this basename: CKTOTAL, CKMB, CKMBINDEX, TROPONINI,  in the last 168 hours BNP: BNP (last 3 results) No results found for this basename: PROBNP,  in the last 8760 hours CBG:  Recent Labs Lab 01/23/14 1251 01/23/14 1624 01/23/14 2023 01/24/14 0021 01/24/14 0358  GLUCAP 178* 232* 254* 235* 209*       Signed:  Demica Zook A  [facility] 01/24/2014, 8:52 AM

## 2014-01-25 ENCOUNTER — Other Ambulatory Visit: Payer: Self-pay | Admitting: Cardiovascular Disease

## 2014-02-09 ENCOUNTER — Ambulatory Visit (INDEPENDENT_AMBULATORY_CARE_PROVIDER_SITE_OTHER): Payer: Medicare Other | Admitting: Cardiovascular Disease

## 2014-02-09 ENCOUNTER — Encounter: Payer: Self-pay | Admitting: Cardiovascular Disease

## 2014-02-09 VITALS — BP 118/60 | HR 74 | Ht 69.0 in

## 2014-02-09 DIAGNOSIS — I482 Chronic atrial fibrillation, unspecified: Secondary | ICD-10-CM

## 2014-02-09 DIAGNOSIS — I739 Peripheral vascular disease, unspecified: Secondary | ICD-10-CM

## 2014-02-09 DIAGNOSIS — E7800 Pure hypercholesterolemia, unspecified: Secondary | ICD-10-CM

## 2014-02-09 NOTE — Patient Instructions (Signed)
Your physician wants you to follow-up in: 1 year with Dr Berry. You will receive a reminder letter in the mail two months in advance. If you don't receive a letter, please call our office to schedule the follow-up appointment.  

## 2014-02-09 NOTE — Assessment & Plan Note (Signed)
Rate controlled on current medications as well as on Eliquis  oral anticoagulation.

## 2014-02-09 NOTE — Assessment & Plan Note (Signed)
On statin therapy followed by his PCP 

## 2014-02-09 NOTE — Progress Notes (Signed)
02/09/2014 Joshua BarretteWilliam C Snow   01/24/1936  [id number]  Primary Physician Ailene RavelHAMRICK,MAURA L, MD Primary Cardiologist: Runell GessJonathan J. Thessaly Mccullers MD Roseanne RenoFACP,FACC,FAHA, FSCAI   HPI:  [name] is a 78 year old moderately overweight married Caucasian male patient of Dr. Rocco Sereneichard Weintraub's who  is accompanied by his wife today. He has a history of peripheral vascular disease status post bilateral iliac stenting remotely by Dr. Alanda AmassWeintraub and ultimately aortobifemoral bypass grafting by Dr. Gretta Beganodd Early. He does have an occluded right popliteal and tibial vessels as well. His other problems include history of stroke. He is hemiparetic and wheelchair-bound. He has diabetes, atrial fibrillation, hyperlipidemia and hypertension. There is no history of heart disease however. Dr. Alanda AmassWeintraub began him on amiodarone and Coumadin anticoagulation. He was admitted for a supratherapeutic INR of 11 and underwent transfusion. He was ultimately changed to a novel oral anticoagulant (Eloquis).). Since I saw him in the office one year ago he has remained cardiovascular stable. He has been admitted twice for GI issues including most recently ileus which has since resolved.    Current Outpatient Prescriptions  Medication Sig Dispense Refill  . amiodarone (PACERONE) 200 MG tablet TAKE 1 TABLET BY MOUTH EVERY DAY  30 tablet  3  . amitriptyline (ELAVIL) 25 MG tablet Take 25 mg by mouth at bedtime.       [name]. apixaban (ELIQUIS) 5 MG TABS tablet Take 1 tablet (5 mg total) by mouth 2 (two) times daily.  56 tablet  0  . atorvastatin (LIPITOR) 40 MG tablet Take 40 mg by mouth every morning.       . cilostazol (PLETAL) 50 MG tablet Take 1 tablet (50 mg total) by mouth 2 (two) times daily.  60 tablet  11  . furosemide (LASIX) 20 MG tablet Take 20 mg by mouth every morning.       . insulin glargine (LANTUS) 100 UNIT/ML injection Inject 0.2 mLs (20 Units total) into the skin at bedtime.      [name]. KLOR-CON M20 20 MEQ tablet Take 20 mEq by  mouth 2 (two) times daily.       [name]. levothyroxine (SYNTHROID, LEVOTHROID) 200 MCG tablet Take 200 mcg by mouth daily before breakfast.      . lipase/protease/amylase (CREON-12/PANCREASE) 12000 UNITS CPEP capsule Take 3 capsules by mouth 3 (three) times daily before meals.       . metoprolol succinate (TOPROL-XL) 25 MG 24 hr tablet Take 0.5 tablets (12.5 mg total) by mouth daily.  15 tablet  5  . Multiple Vitamin (MULITIVITAMIN WITH MINERALS) TABS Take 1 tablet by mouth every morning.       [name]. OVER THE COUNTER MEDICATION Apply 1 application topically 4 (four) times daily as needed (Butt rash).      [name]. oxyCODONE-acetaminophen (PERCOCET) 10-325 MG per tablet Take 1 tablet by mouth 2 (two) times daily as needed.      . saccharomyces boulardii (FLORASTOR) 250 MG capsule Take 1 capsule (250 mg total) by mouth 2 (two) times daily.  60 capsule  0  . tamsulosin (FLOMAX) 0.4 MG CAPS capsule Take 1 capsule (0.4 mg total) by mouth daily.  30 capsule  0  . traMADol (ULTRAM) 50 MG tablet Take 50 mg by mouth at bedtime.      [name]. venlafaxine XR (EFFEXOR-XR) 75 MG 24 hr capsule Take 75 mg by mouth daily with breakfast.       No current facility-administered medications for this visit.    Allergies  Allergen Reactions  .  Hydromorphone Hcl Other (See Comments)    "makes him crazy" per wife  . Penicillins Hives    History   Social History  . Marital Status: Married    Spouse Name: [name]    Number of Children: 3  . Years of Education: 8   Occupational History  . 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 Smoking status: Former Smoker    Types: Cigarettes    Quit date: 05/07/1996  . Smokeless tobacco: Never Used  . Alcohol Use: No  . Drug Use: No  . Sexual Activity: No   Other Topics Concern  . Not on file   Social History Narrative   Lives at home with wife.  Non-ambulatory since 2009.  Wife has a lift chair, wheel chair and hospital bed at home.     Review of  Systems: General: negative for chills, fever, night sweats or weight changes.  Cardiovascular: negative for chest pain, dyspnea on exertion, edema, orthopnea, palpitations, paroxysmal nocturnal dyspnea or shortness of breath Dermatological: negative for rash Respiratory: negative for cough or wheezing Urologic: negative for hematuria Abdominal: negative for nausea, vomiting, diarrhea, bright red blood per rectum, melena, or hematemesis Neurologic: negative for visual changes, syncope, or dizziness All other systems reviewed and are otherwise negative except as noted above.    Blood pressure 118/60, pulse 74, height 5\' 9"  (1.753 m), weight 0 lb (0 kg).  General appearance: alert and no distress Neck: no adenopathy, no carotid bruit, no JVD, supple, symmetrical, trachea midline and thyroid not enlarged, symmetric, no tenderness/mass/nodules Lungs: clear to auscultation bilaterally Heart: irregularly irregular rhythm Extremities: extremities normal, atraumatic, no cyanosis or edema  EKG atrial fibrillation with a ventricular response of 74 and an IVCD with poor R-wave progression  ASSESSMENT AND PLAN:   Atrial fibrillation Rate controlled on current medications as well as on Eliquis  oral anticoagulation.  Hypercholesterolemia On statin therapy followed by his PCP  Peripheral arterial disease History of peripheral vascular disease status post bilateral iliac stenting remotely by Dr. Alanda AmassWeintraub ultimately undergoing aortobifemoral bypass grafting by Dr. Gretta Beganodd Early. He does have an occluded right popliteal and tibial vessels as well. He is wheelchair-bound because of prior stroke and really does not ambulate. He also has a history of carotid artery disease with known occluded right internal carotid artery adipose ultrasound and a widely patent left.      Runell GessJonathan J. Shirlyn Savin MD FACP,FACC,FAHA, [facility] 02/09/2014 9:06 AM

## 2014-02-09 NOTE — Assessment & Plan Note (Signed)
History of peripheral vascular disease status post bilateral iliac stenting remotely by Dr. Alanda AmassWeintraub ultimately undergoing aortobifemoral bypass grafting by Dr. Gretta Beganodd Early. He does have an occluded right popliteal and tibial vessels as well. He is wheelchair-bound because of prior stroke and really does not ambulate. He also has a history of carotid artery disease with known occluded right internal carotid artery adipose ultrasound and a widely patent left.

## 2014-02-17 LAB — STOOL CULTURE

## 2014-04-07 ENCOUNTER — Encounter: Payer: Self-pay | Admitting: Cardiovascular Disease

## 2014-04-11 ENCOUNTER — Other Ambulatory Visit: Payer: Self-pay | Admitting: Cardiovascular Disease

## 2014-04-12 NOTE — Telephone Encounter (Signed)
Rx refill sent to patient pharmacy   

## 2014-04-13 ENCOUNTER — Telehealth: Payer: Self-pay | Admitting: Cardiovascular Disease

## 2014-04-13 NOTE — Telephone Encounter (Signed)
[place] from [place].

## 2014-04-13 NOTE — Telephone Encounter (Signed)
Called pt back to notify about recent lab work.

## 2014-05-19 ENCOUNTER — Telehealth: Payer: Self-pay | Admitting: Cardiovascular Disease

## 2014-05-19 NOTE — Telephone Encounter (Signed)
°  1. Which medications need to be refilled? Amlodipine  2. Which pharmacy is medication to be sent to? [facility]  3. Do they need a 30 day or 90 day supply? 90  4. Would they like a call back once the medication has been sent to the pharmacy? no

## 2014-05-19 NOTE — Telephone Encounter (Signed)
LMTCB Amlodipine is not on med list

## 2014-05-20 MED ORDER — AMIODARONE HCL 200 MG PO TABS: 200.0000 mg | ORAL_TABLET | Freq: Every day | ORAL | Status: DC

## 2014-05-20 NOTE — Telephone Encounter (Signed)
Spoke with pt wife, aware script sent to the pharmacy.

## 2014-05-20 NOTE — Telephone Encounter (Signed)
Please call, wife says the medicine is on his list.She said the medicine is Amiodarone.

## 2014-08-10 IMAGING — CR DG ABDOMEN ACUTE W/ 1V CHEST
4 series · 4 of 4 positions shown · non-contrast
Comparison: Chest radiograph performed 07/14/2011, and chest and
abdominal radiographs performed 05/24/2008

CLINICAL DATA: Abdominal pain and vomiting.

ACUTE ABDOMEN SERIES (ABDOMEN 2 VIEW & CHEST 1 VIEW)

[w abdomen decub]
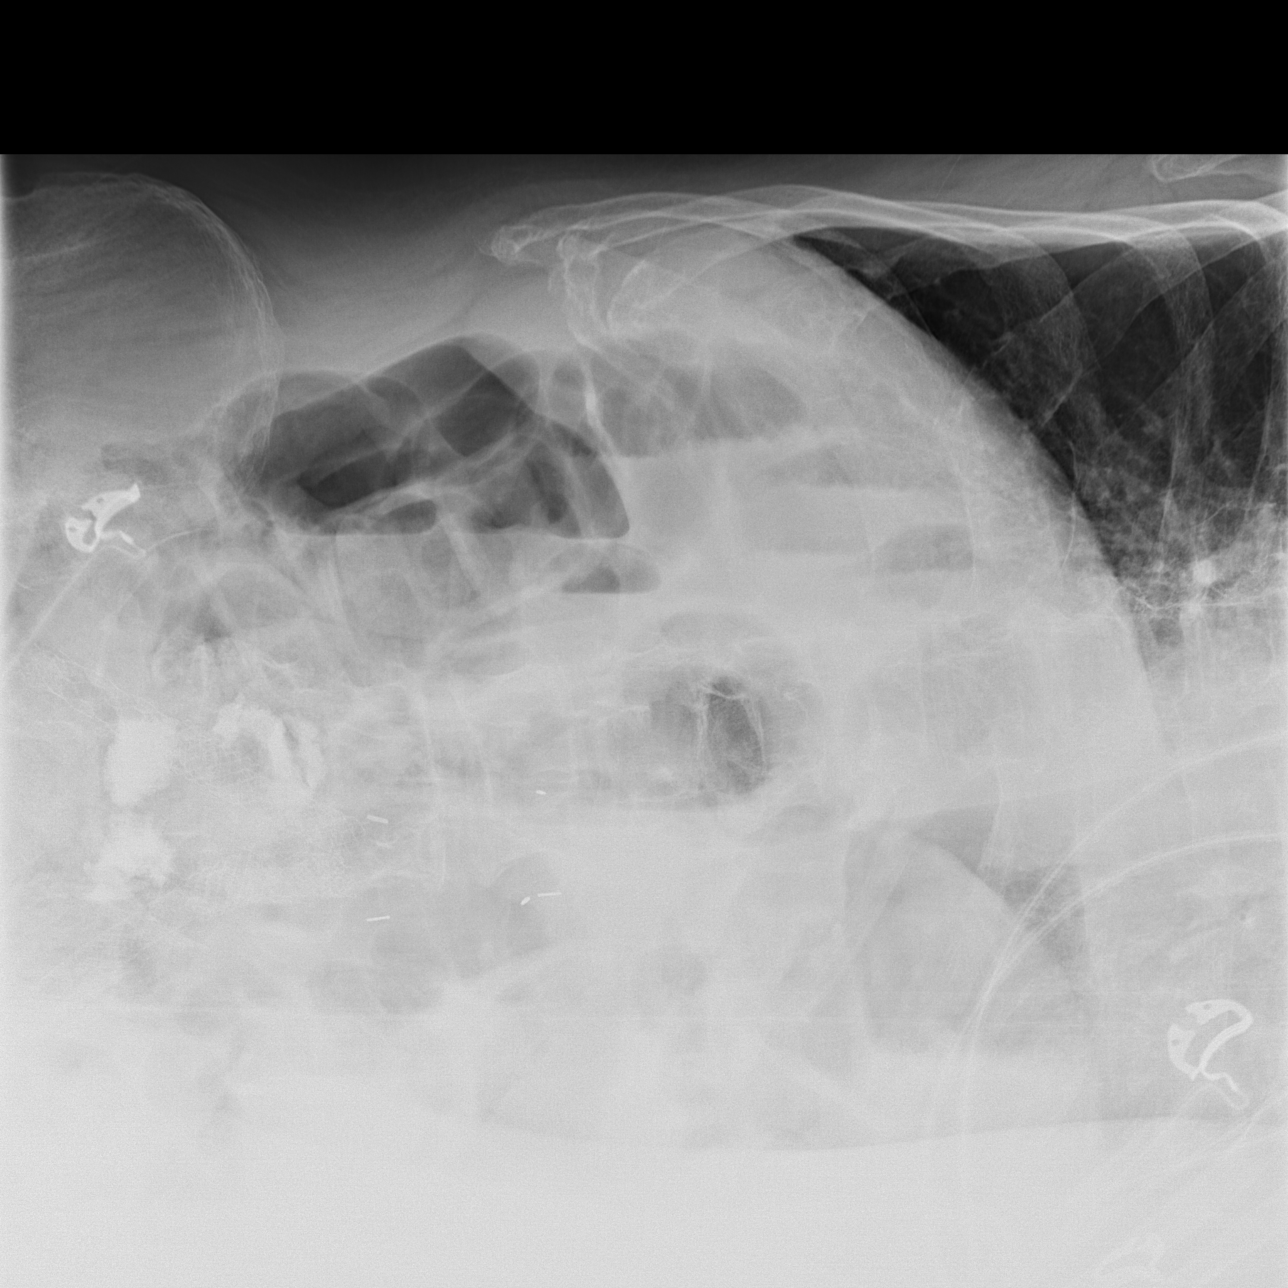

[x abdomen supine (1 of 2)]
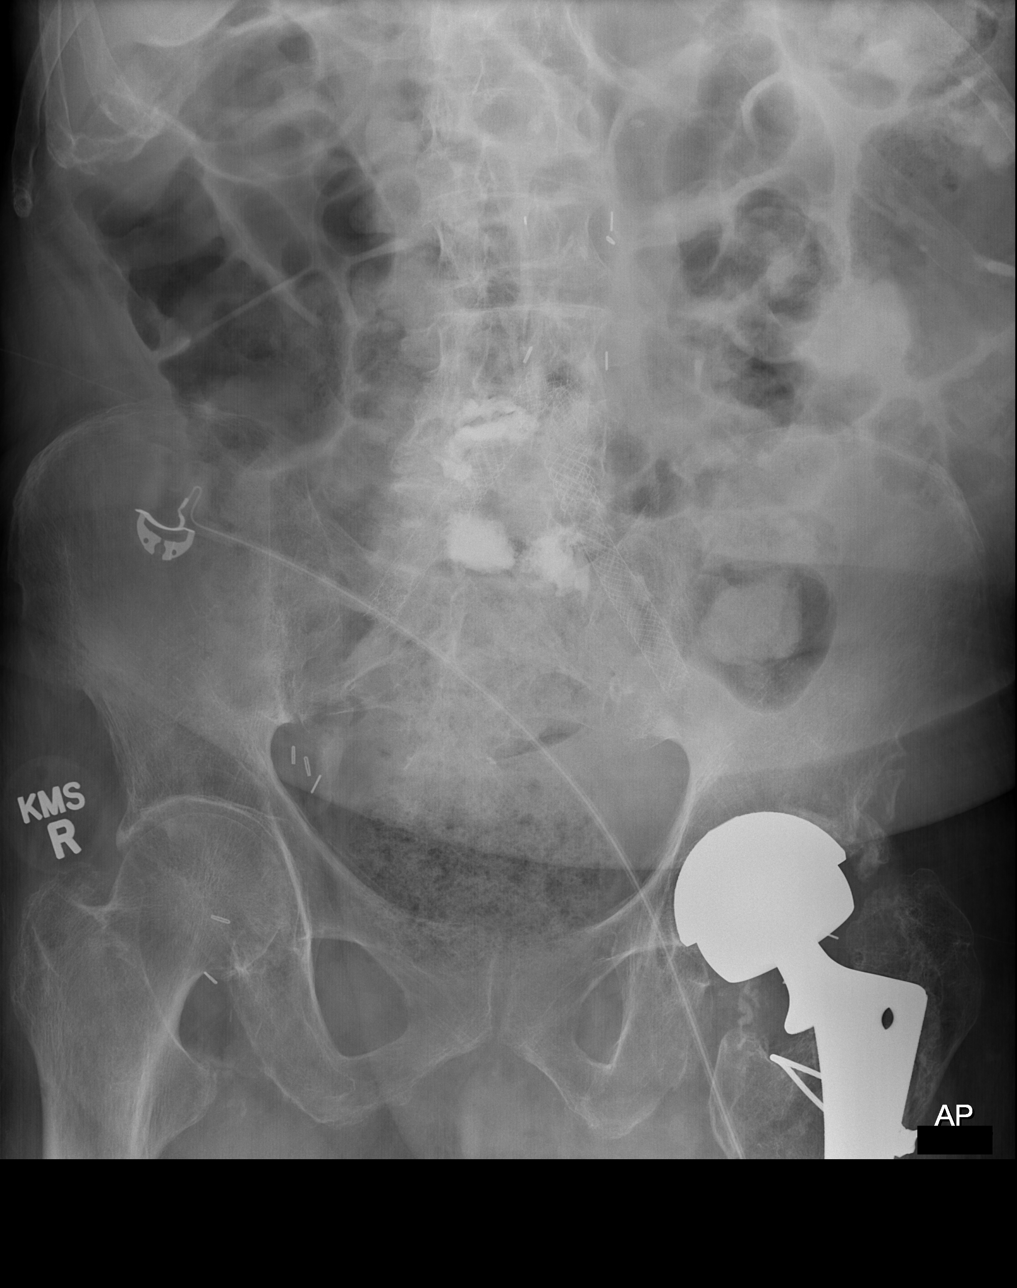

[x abdomen supine (2 of 2)]
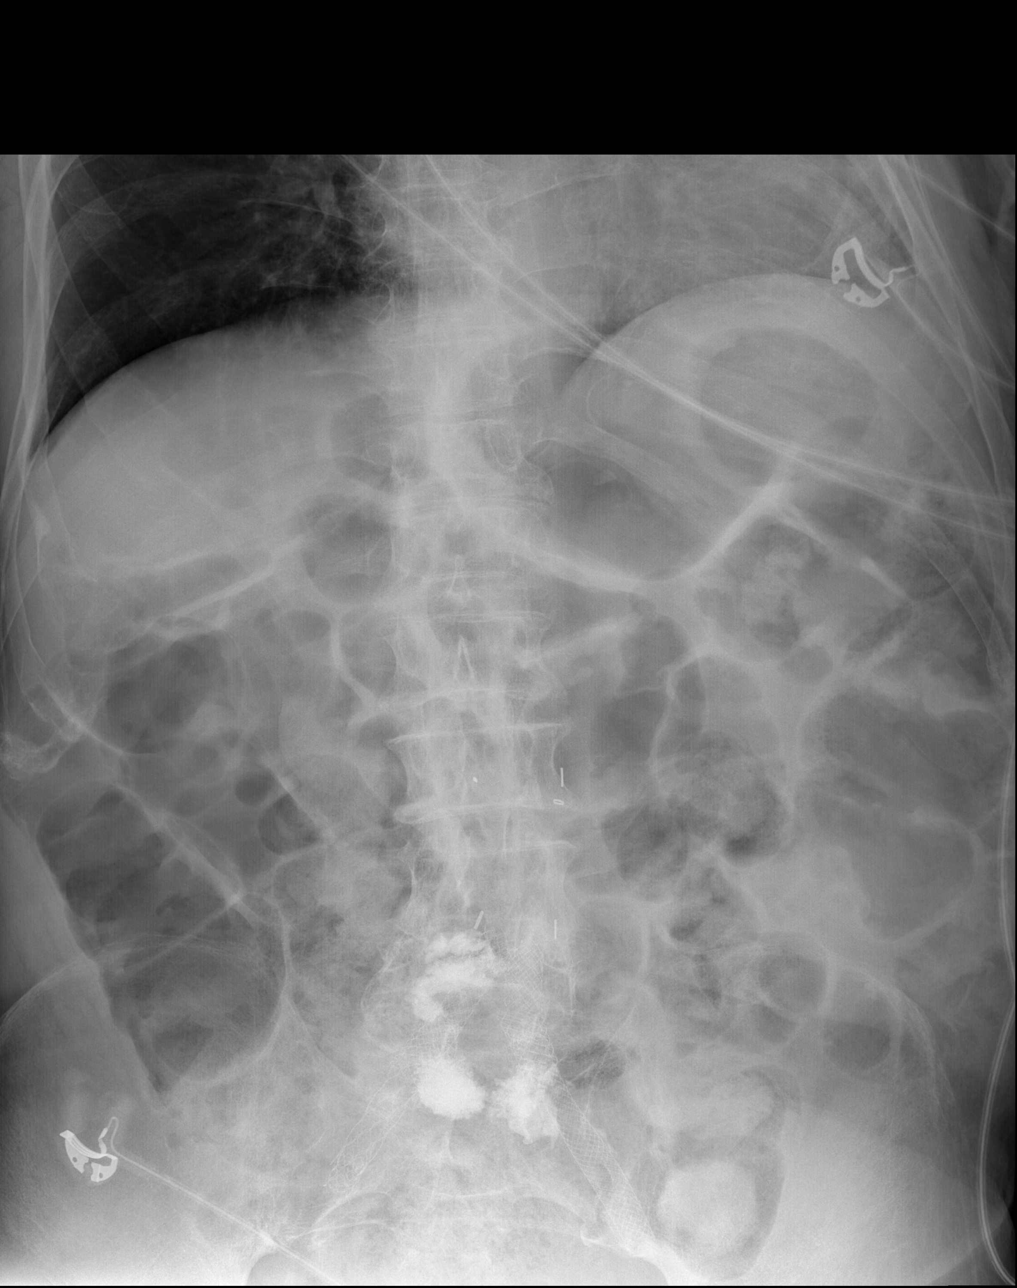

[x chest ap]
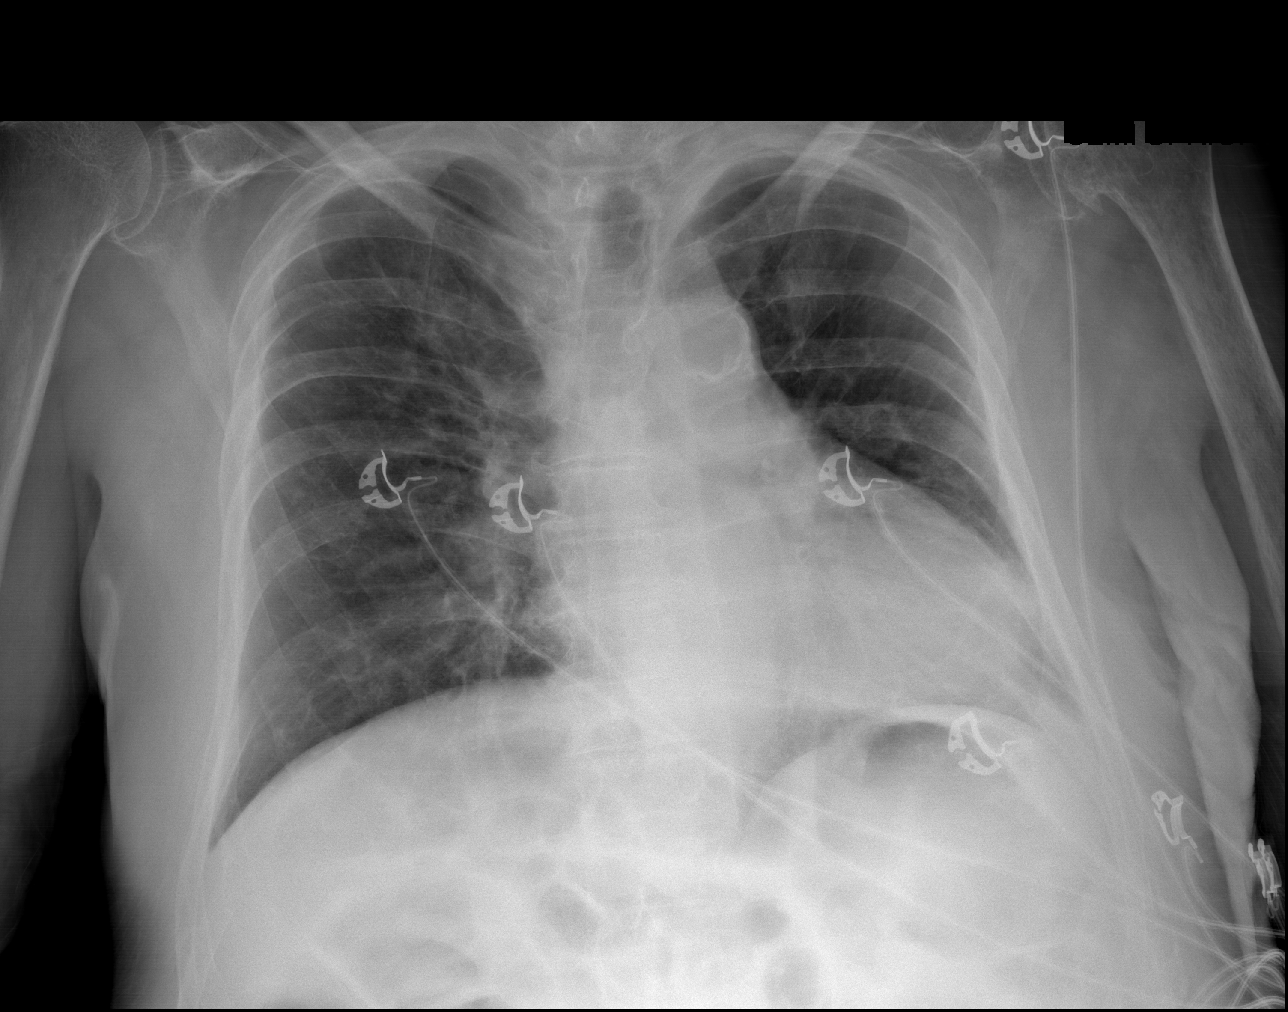

[4 of 4 positions shown; findings below may reference images not displayed]

FINDINGS: The lungs are well-aerated.  Mild right perihilar and
left basilar opacity may reflect atelectasis or possibly mild
pneumonia.  There is no evidence of pleural effusion or
pneumothorax.  The cardiomediastinal silhouette is borderline
normal in size; calcification is noted within the aortic arch.

The visualized bowel gas pattern is nonspecific.  There is slight
apparent distension of small and large bowel loops, which could
reflect mild ileus; a few air-fluid levels are seen.  There is no
evidence of focal small bowel dilatation to suggest obstruction.
No free intra-abdominal air is identified on the provided decubitus
view.

No acute osseous abnormalities are seen; the sacroiliac joints are
unremarkable in appearance.  An aortoiliac stent is noted; changes
of vertebroplasty are seen along the lower lumbar spine.  The
patients left hip prosthesis is unremarkable in appearance, though
incompletely imaged.
IMPRESSION: 1.  Slight apparent distension of small and large bowel loops may
reflect mild ileus; no evidence of bowel obstruction.  No free
intra-abdominal air seen.
2.  Mild right perihilar and left basilar airspace opacities may
reflect atelectasis or possibly mild pneumonia.

## 2014-11-26 IMAGING — CR DG FOOT COMPLETE 3+V*L*
3 series · 3 of 3 positions shown · non-contrast
Comparison: None.

CLINICAL DATA: Evaluate for osteomyelitis. Left heel wound and left
lateral foot wound. Diabetes. History of left 5th toe amputation)
history [facility], Diamond[facility].

EXAM:
LEFT FOOT - COMPLETE 3+ VIEW

[w foot lat left *]
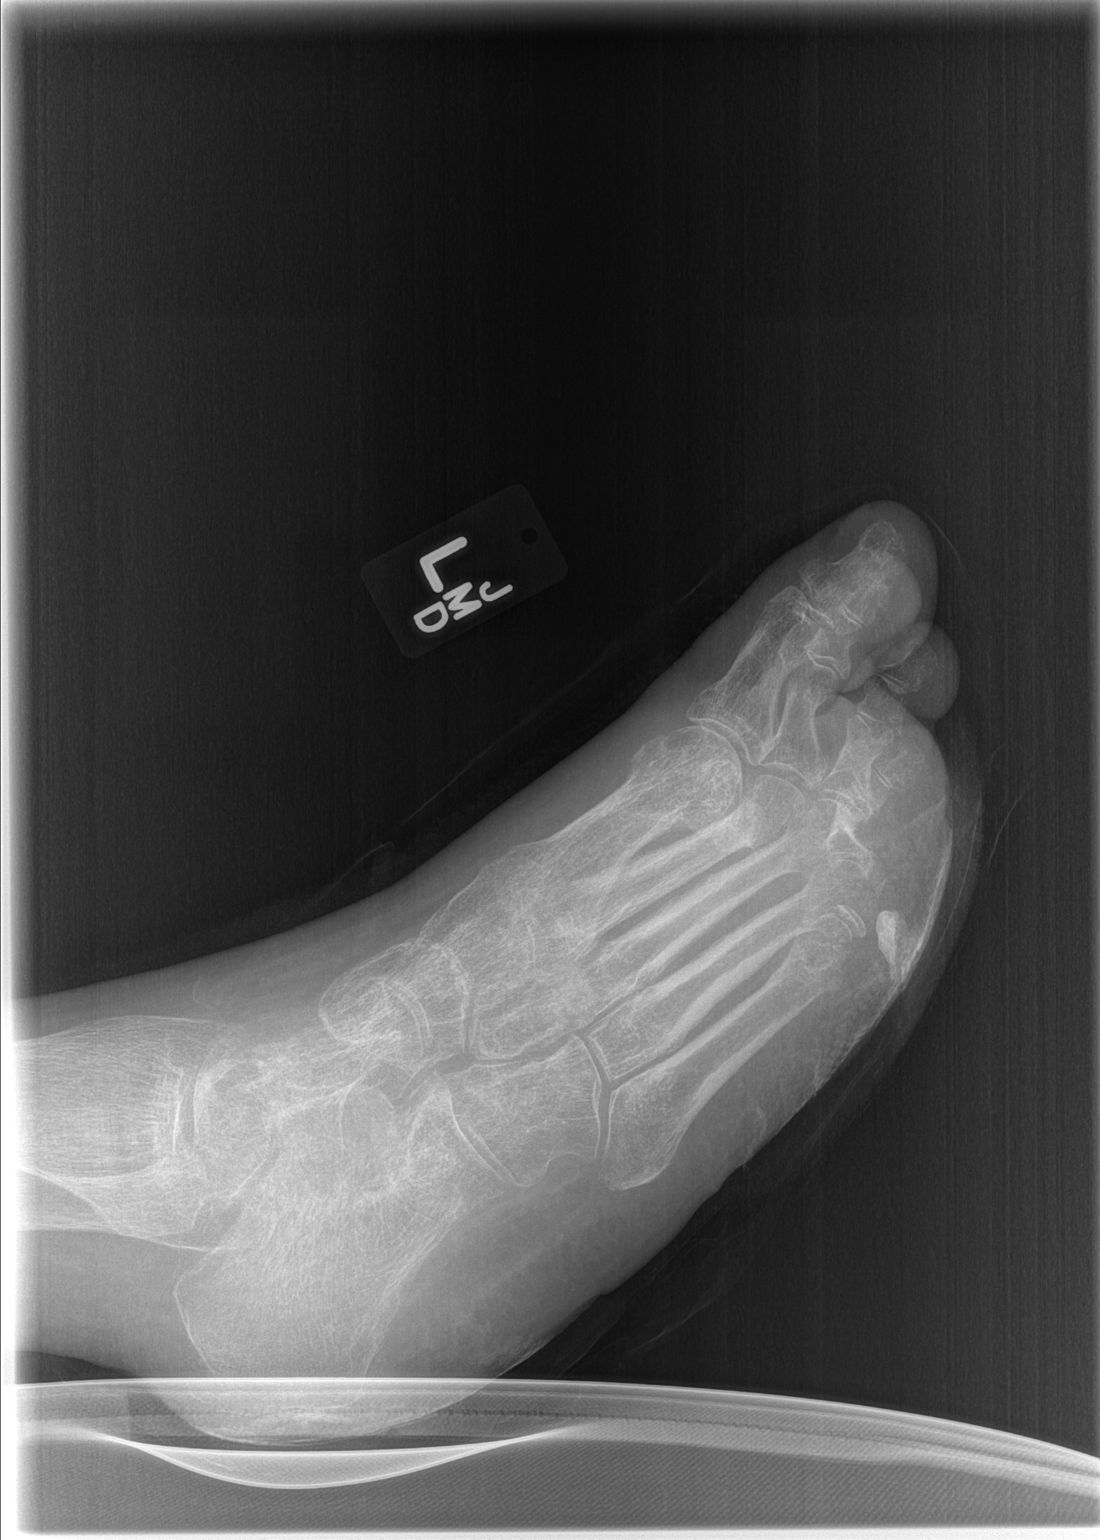

[view not recorded (1 of 2)]
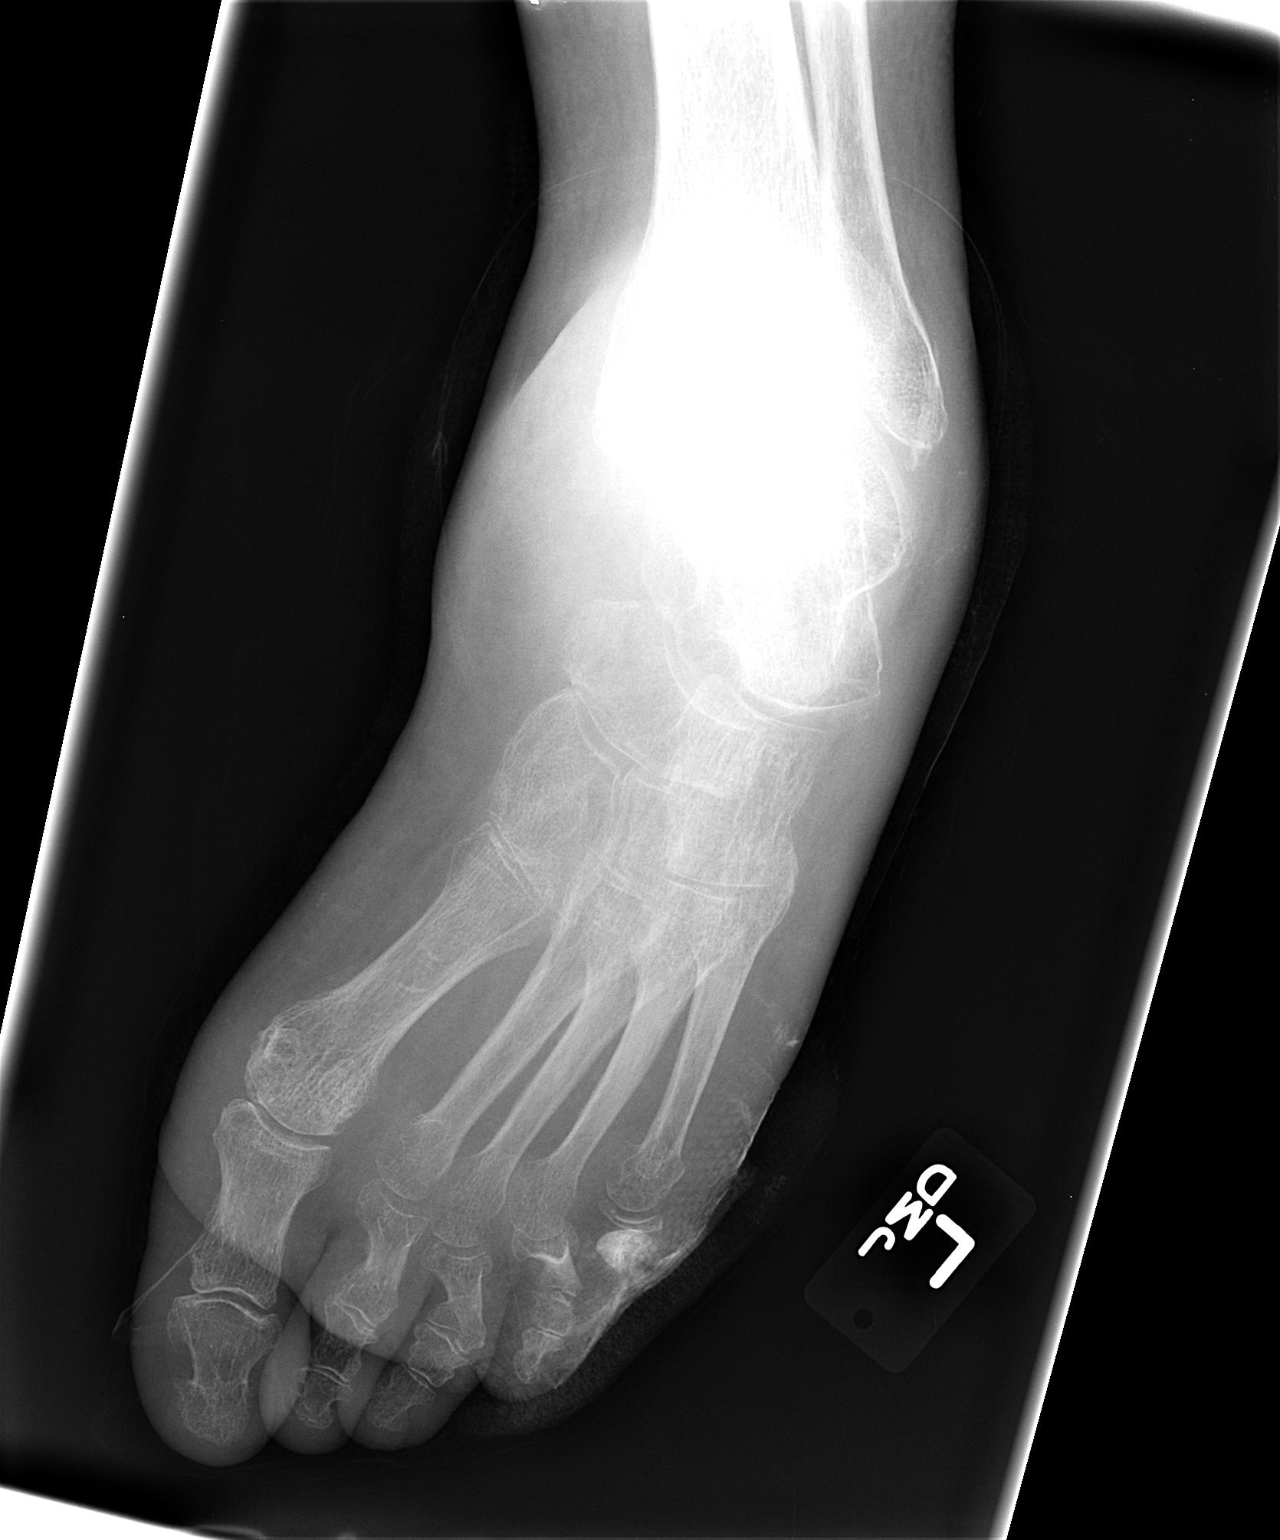

[view not recorded (2 of 2)]
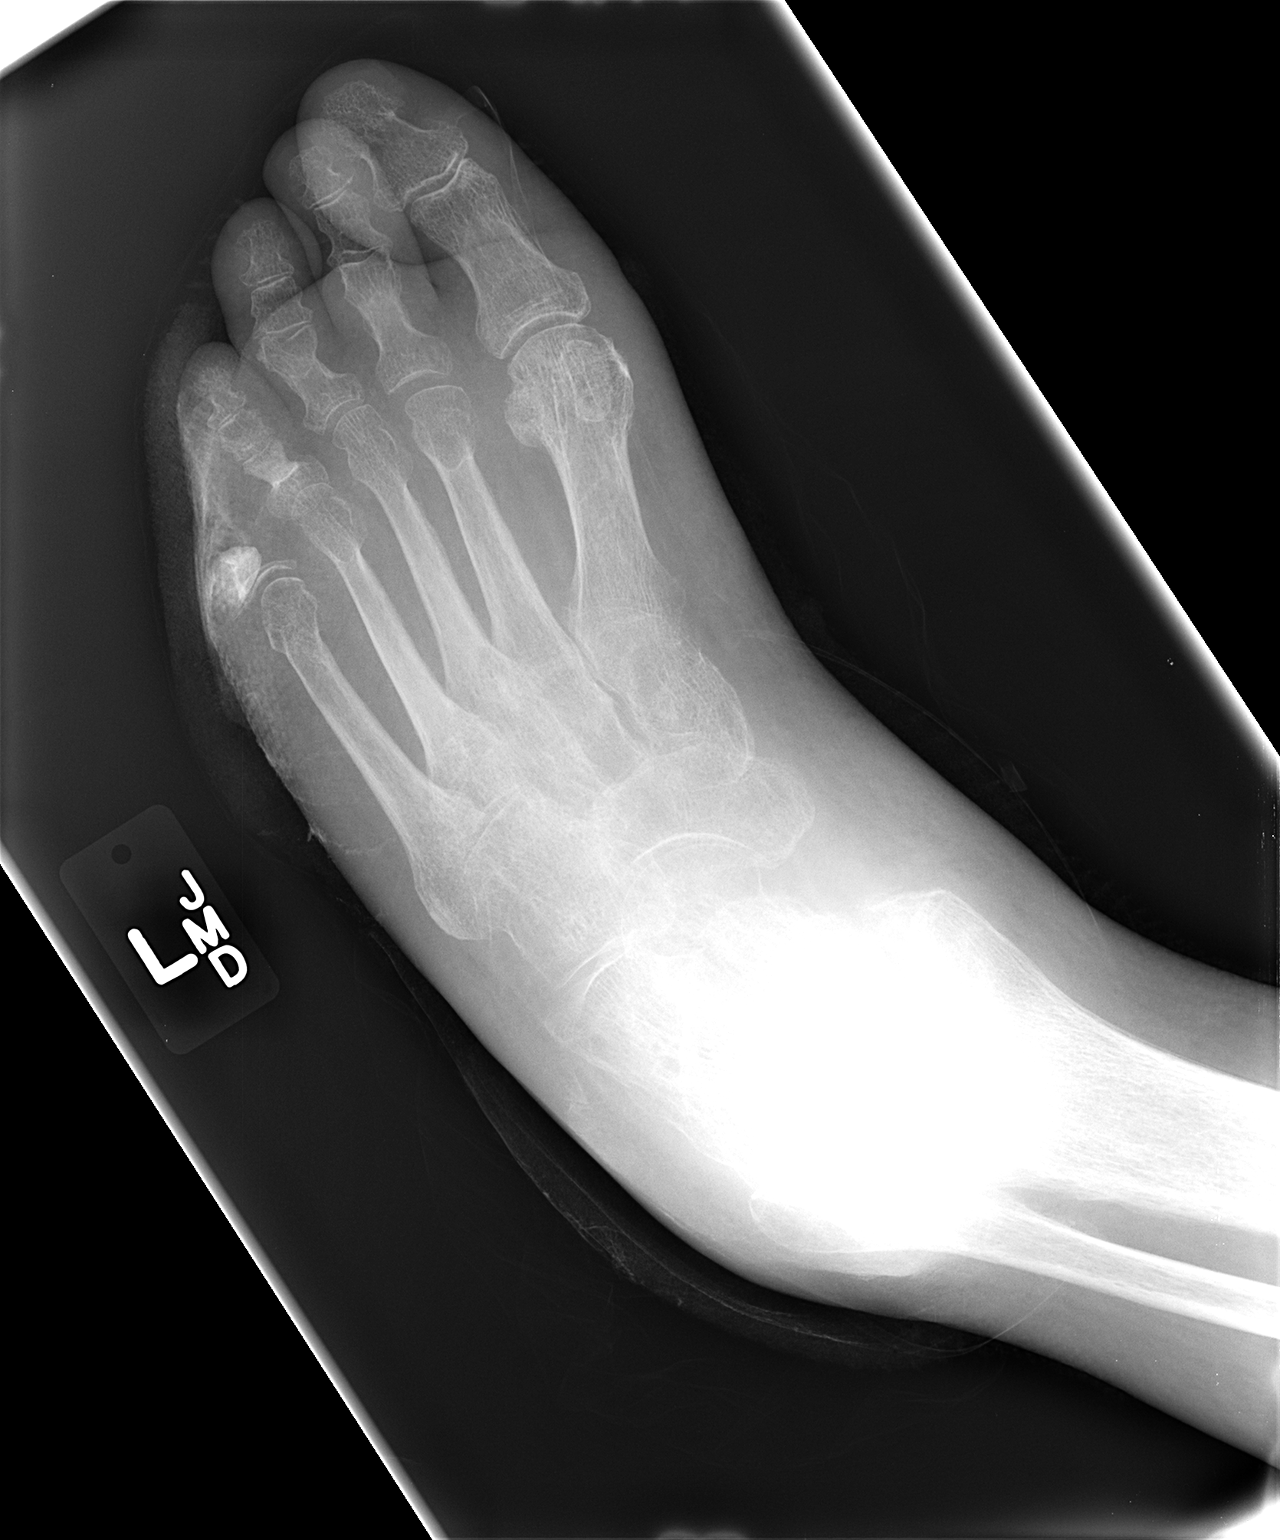

[3 of 3 positions shown; findings below may reference images not displayed]

FINDINGS: The bones are diffusely osteopenic. There is bandage material about
the distal lateral left foot. There postsurgical changes of left 5th
toe amputation. Oval-shaped area of hyperdensity adjacent to the
bandage along the plantar soft tissues of the lateral left foot at
the level of the amputated 5th toes may reflect a silver containing
medicine.

No periosteal reaction, fracture, or definite bony destruction is
identified. The soft tissues are diffusely swollen.
IMPRESSION: 1. Diffuse soft tissue swelling of the left foot and diffuse
osteopenia of the bones. No definite radiographic evidence of
osteomyelitis.

2. Amputation of the 5th toe.

## 2014-11-26 IMAGING — CR DG FOOT COMPLETE 3+V*R*
3 series · 3 of 3 positions shown · non-contrast
Comparison: None

CLINICAL DATA: Diabetes, wound at lateral right foot question
osteomyelitis

EXAM:
RIGHT FOOT COMPLETE - 3+ VIEW

[view not recorded (1 of 3)]
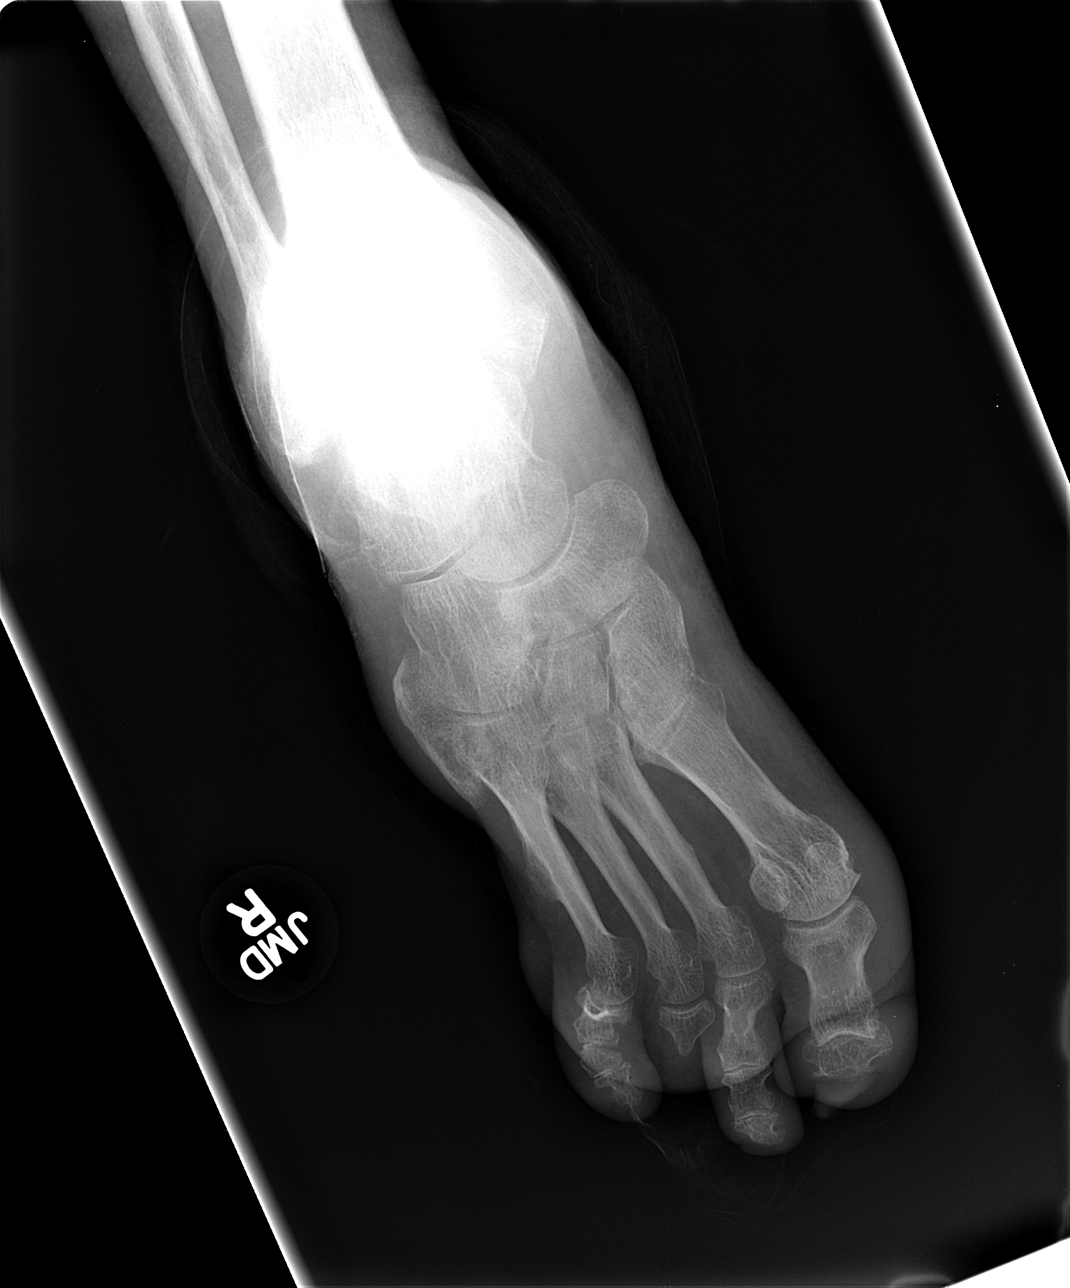

[view not recorded (2 of 3)]
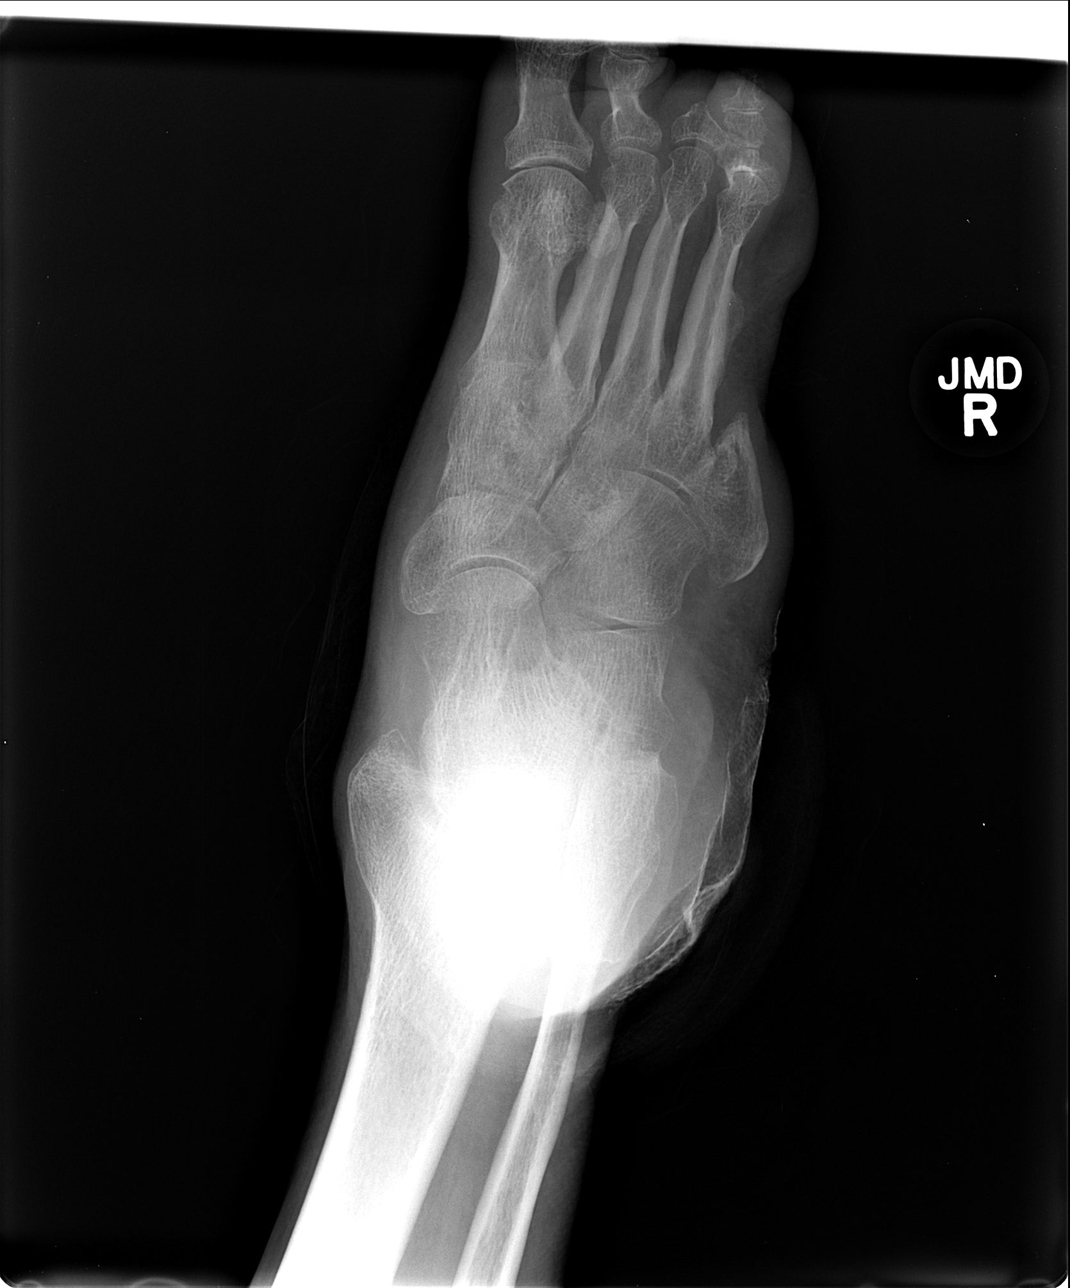

[view not recorded (3 of 3)]
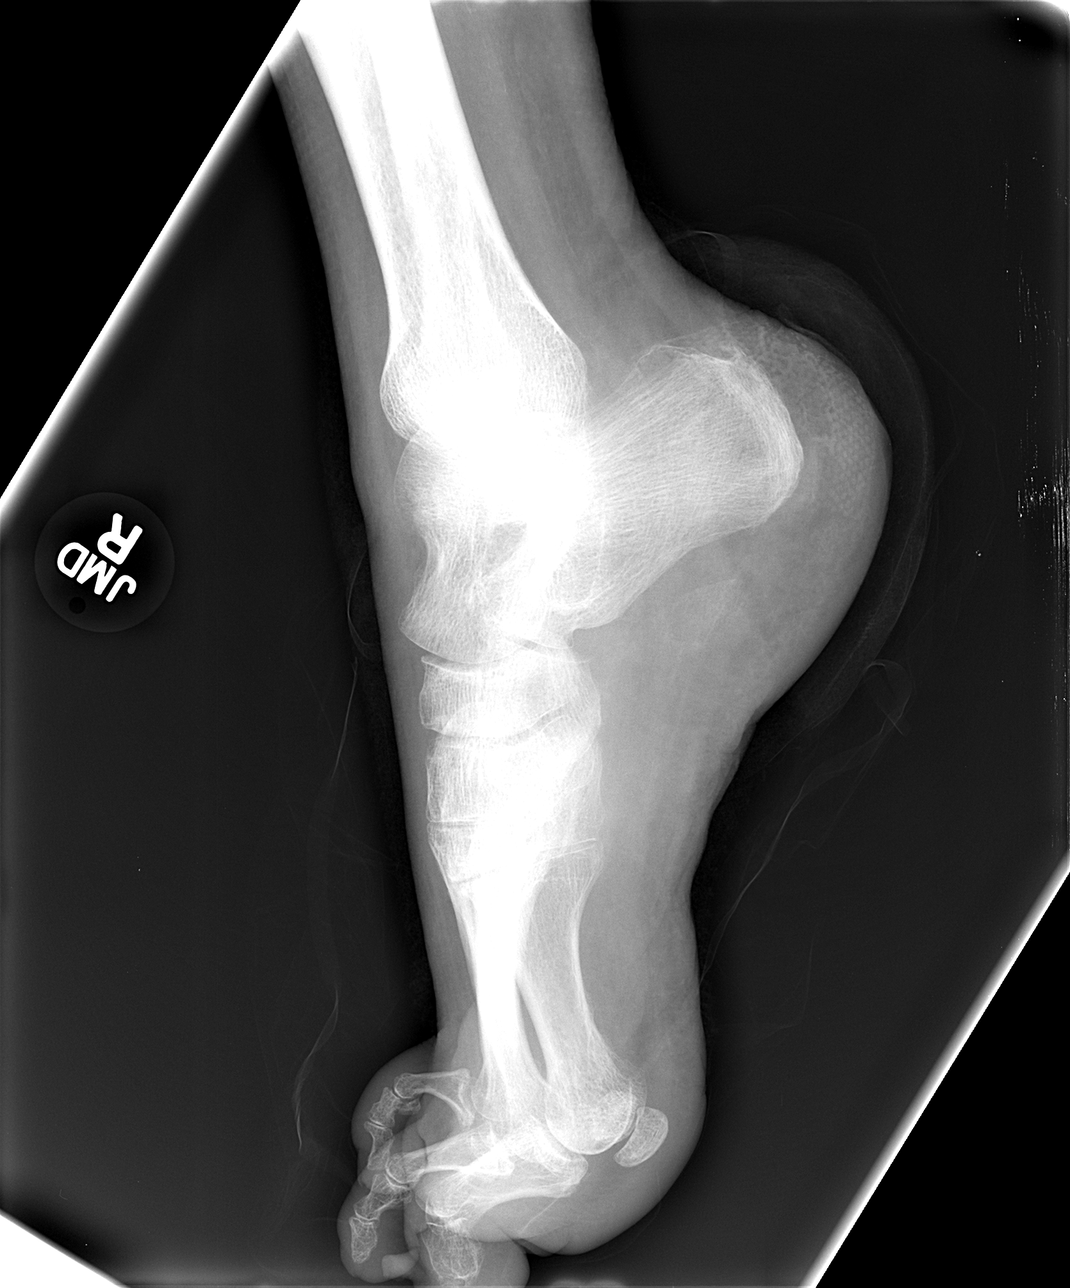

[3 of 3 positions shown; findings below may reference images not displayed]

FINDINGS: Osseous demineralization.

Prior amputation of the 3rd toe and the 5th ray.

Question posttraumatic deformity at the proximal phalanx of the 4th
toe.

No acute fracture, dislocation or bone destruction.

Dressing artifacts at lateral aspect at heel and hindfoot without
definite underlying bone destruction to suggest osteomyelitis.
IMPRESSION: Prior amputations of the 3rd toe and 5th ray.

Osseous demineralization.

No definite acute osseous findings identified to suggest
osteomyelitis though if this remains a clinical concern, recommend
MR imaging.

## 2015-02-09 ENCOUNTER — Ambulatory Visit (INDEPENDENT_AMBULATORY_CARE_PROVIDER_SITE_OTHER): Payer: Commercial Managed Care - HMO | Admitting: Cardiovascular Disease

## 2015-02-09 ENCOUNTER — Encounter: Payer: Self-pay | Admitting: Cardiovascular Disease

## 2015-02-09 VITALS — BP 106/72 | HR 76 | Ht 68.0 in | Wt 175.0 lb

## 2015-02-09 DIAGNOSIS — I482 Chronic atrial fibrillation, unspecified: Secondary | ICD-10-CM

## 2015-02-09 DIAGNOSIS — E7800 Pure hypercholesterolemia, unspecified: Secondary | ICD-10-CM | POA: Diagnosis not present

## 2015-02-09 NOTE — Progress Notes (Signed)
02/09/2015 [name]   12/14/1935  [id number]  Primary Physician [name],Joshua L, MD Primary Cardiologist: Joshua GessJonathan J. Lyra Alaimo MD [name],FACC,FAHA, FSCAI   HPI:  [name] is a 79 year old moderately overweight married Caucasian male patient of Dr. Rocco Sereneichard Snow's who is accompanied by his wife today. I last saw him in the office 02/09/14. He has a history of peripheral vascular disease status post bilateral iliac stenting remotely by Dr. Alanda Snow and ultimately aortobifemoral bypass grafting by Dr. Gretta [name]. He does have an occluded right popliteal and tibial vessels as well. His other problems include history of stroke January 2009. He is hemiparetic and wheelchair-bound. He has diabetes, atrial fibrillation, hyperlipidemia and hypertension. There is no history of heart disease however. Dr. Alanda Snow began him on amiodarone and Coumadin anticoagulation. He was admitted for a supratherapeutic INR of 11 and underwent transfusion. He was ultimately changed to a novel oral anticoagulant (Eloquis).). Since I saw him in the office one year ago he has remained cardiovascular stable.    Current Outpatient Prescriptions  Medication Sig Dispense Refill  . amiodarone (PACERONE) 200 MG tablet Take 1 tablet (200 mg total) by mouth daily. 90 tablet 3  . amitriptyline (ELAVIL) 25 MG tablet Take 25 mg by mouth at bedtime.     [name]. apixaban (ELIQUIS) 5 MG TABS tablet Take 1 tablet (5 mg total) by mouth 2 (two) times daily. 56 tablet 0  . atorvastatin (LIPITOR) 40 MG tablet Take 40 mg by mouth every morning.     . cilostazol (PLETAL) 50 MG tablet Take 1 tablet (50 mg total) by mouth 2 (two) times daily. 60 tablet 11  . furosemide (LASIX) 20 MG tablet Take 20 mg by mouth every morning.     . insulin glargine (LANTUS) 100 UNIT/ML injection Inject 0.2 mLs (20 Units total) into the skin at bedtime.    [name]. KLOR-CON M20 20 MEQ tablet Take 20 mEq by mouth 2 (two) times daily.     [name]. levothyroxine  (SYNTHROID, LEVOTHROID) 200 MCG tablet Take 200 mcg by mouth daily before breakfast.    . lipase/protease/amylase (CREON-12/PANCREASE) 12000 UNITS CPEP capsule Take 3 capsules by mouth 3 (three) times daily before meals.     . metoprolol succinate (TOPROL-XL) 25 MG 24 hr tablet Take 0.5 tablets (12.5 mg total) by mouth daily. 15 tablet 5  . Multiple Vitamin (MULITIVITAMIN WITH MINERALS) TABS Take 1 tablet by mouth every morning.     [name]. OVER THE COUNTER MEDICATION Apply 1 application topically 4 (four) times daily as needed (Butt rash).    [name]. oxyCODONE-acetaminophen (PERCOCET) 10-325 MG per tablet Take 1 tablet by mouth 2 (two) times daily as needed.    . saccharomyces boulardii (FLORASTOR) 250 MG capsule Take 1 capsule (250 mg total) by mouth 2 (two) times daily. 60 capsule 0  . tamsulosin (FLOMAX) 0.4 MG CAPS capsule Take 1 capsule (0.4 mg total) by mouth daily. 30 capsule 0  . venlafaxine XR (EFFEXOR-XR) 75 MG 24 hr capsule Take 75 mg by mouth daily with breakfast.     No current facility-administered medications for this visit.    Allergies  Allergen Reactions  . Hydromorphone Hcl Other (See Comments)    "makes him crazy" per wife  . Penicillins Hives    Social History   Social History  . Marital Status: Married    Spouse Name: [name]  . Number of Children: 3  . Years of Education: 8   Occupational History  . Worked in  a Warehouse manager    Social History Main Topics  . Smoking status: Former Smoker    Types: Cigarettes    Quit date: 05/07/1996  . Smokeless tobacco: Never Used  . Alcohol Use: No  . Drug Use: No  . Sexual Activity: No   Other Topics Concern  . Not on file   Social History Narrative   Lives at home with wife.  Non-ambulatory since 2009.  Wife has a lift chair, wheel chair and hospital bed at home.     Review of Systems: General: negative for chills, fever, night sweats or weight changes.  Cardiovascular: negative for chest pain, dyspnea on exertion,  edema, orthopnea, palpitations, paroxysmal nocturnal dyspnea or shortness of breath Dermatological: negative for rash Respiratory: negative for cough or wheezing Urologic: negative for hematuria Abdominal: negative for nausea, vomiting, diarrhea, bright red blood per rectum, melena, or hematemesis Neurologic: negative for visual changes, syncope, or dizziness All other systems reviewed and are otherwise negative except as noted above.    Blood pressure 106/72, pulse 76, height  (1.727 m), weight 175 lb (79.379 kg).  General appearance: alert and no distress Neck: no adenopathy, no carotid bruit, no JVD, supple, symmetrical, trachea midline and thyroid not enlarged, symmetric, no tenderness/mass/nodules Lungs: clear to auscultation bilaterally Heart: irregularly irregular rhythm Extremities: extremities normal, atraumatic, no cyanosis or edema  EKG atrial fibrillation with a ventricular response 76, incomplete left bundle-branch block with left anterior fascicular block. I presented this EKG  ASSESSMENT AND PLAN:   Hypercholesterolemia History of hyperlipidemia on atorvastatin followed by his PCP  Atrial fibrillation History of chronic atrial fibrillation  Rate controlled on amiodarone and anticoagulated on Eliquis       Joshua Gess MD [facility], [facility] 02/09/2015 11:41 AM

## 2015-02-09 NOTE — Patient Instructions (Signed)
Medication Instructions:  Your physician recommends that you continue on your current medications as directed. Please refer to the Current Medication list given to you today.  Labwork: none  Testing/Procedures: none  Follow-Up: Your physician wants you to follow-up in: 12 months with Dr. Berry. You will receive a reminder letter in the mail two months in advance. If you don't receive a letter, please call our office to schedule the follow-up appointment.    Any Other Special Instructions Will Be Listed Below (If Applicable).   

## 2015-02-09 NOTE — Assessment & Plan Note (Signed)
History of hyperlipidemia on atorvastatin followed by his PCP 

## 2015-02-09 NOTE — Assessment & Plan Note (Signed)
History of chronic atrial fibrillation  Rate controlled on amiodarone and anticoagulated on Eliquis

## 2015-02-23 ENCOUNTER — Encounter: Payer: Self-pay | Admitting: Cardiovascular Disease

## 2015-06-02 ENCOUNTER — Other Ambulatory Visit: Payer: Self-pay | Admitting: Cardiovascular Disease

## 2015-06-02 NOTE — Telephone Encounter (Signed)
REFILL 

## 2015-09-24 IMAGING — CR DG ABDOMEN 1V
2 series · 2 of 2 positions shown · non-contrast
Comparison: CT 03/20/2013.  Acute abdominal series 09/26/2012.

CLINICAL DATA: Abdominal pain with diarrhea.

EXAM:
ABDOMEN - 1 VIEW

[t abdomen supine (1 of 2)]
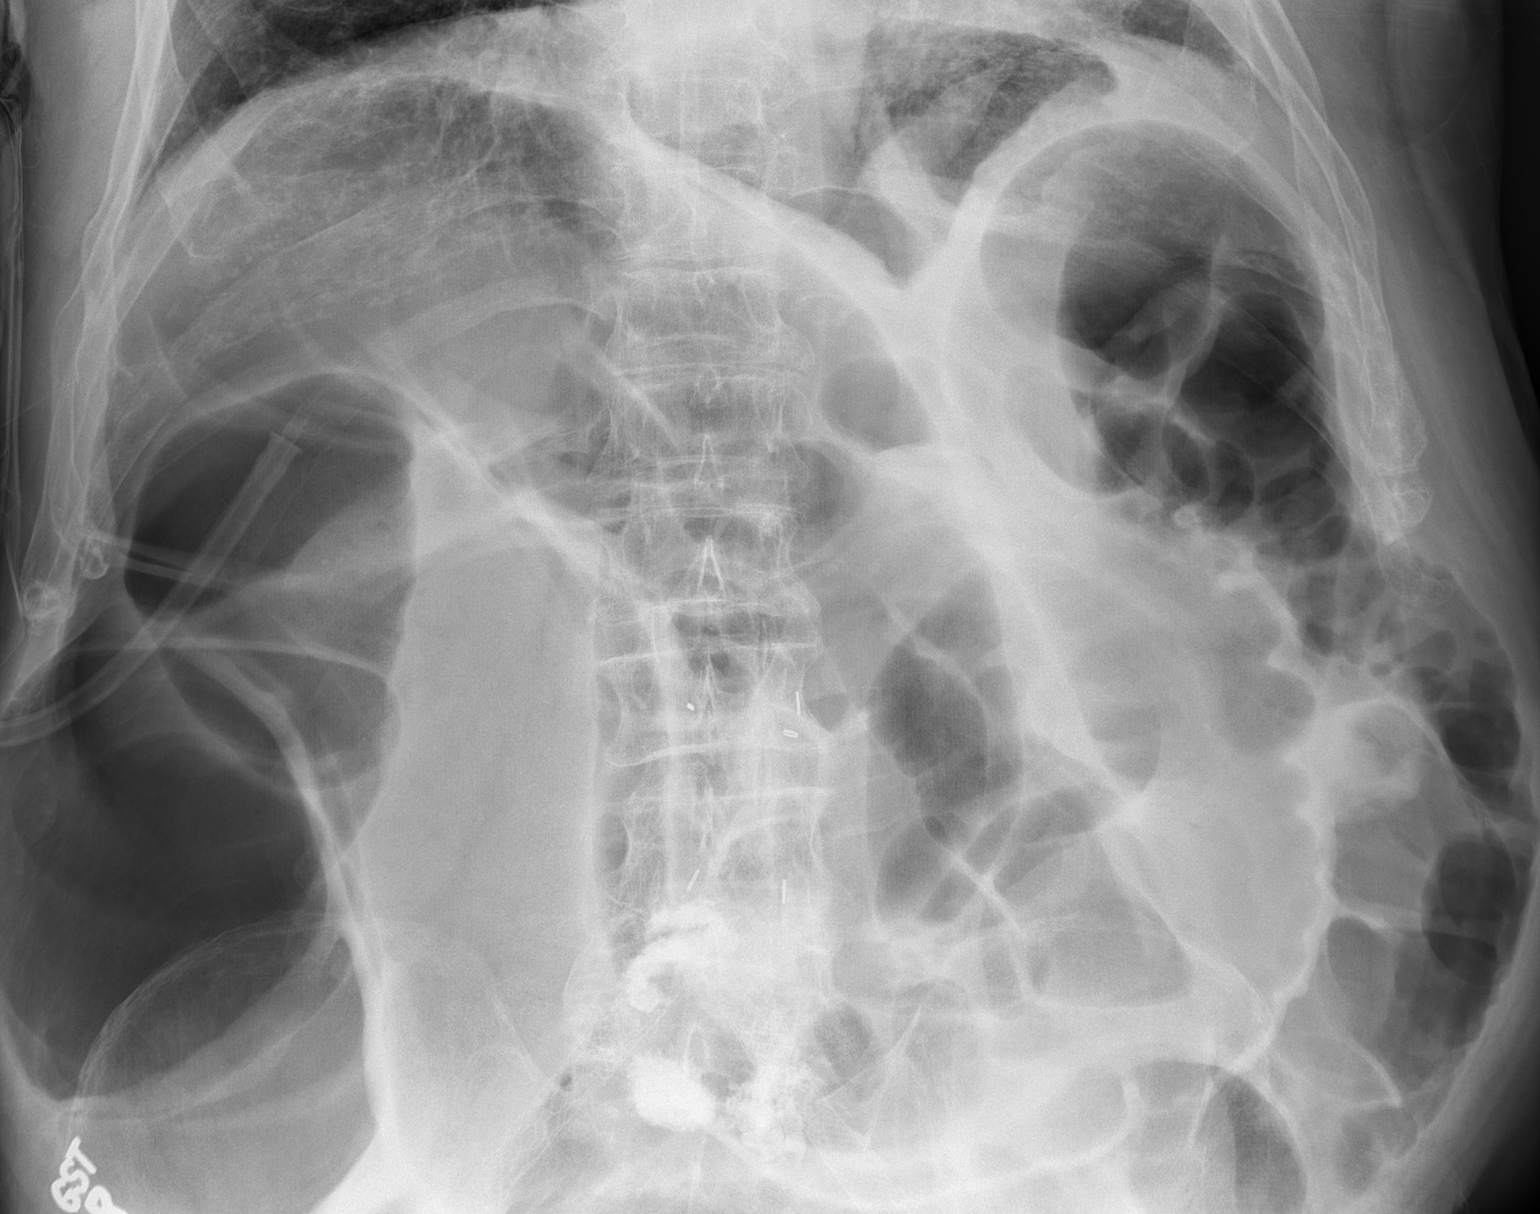

[t abdomen supine (2 of 2)]
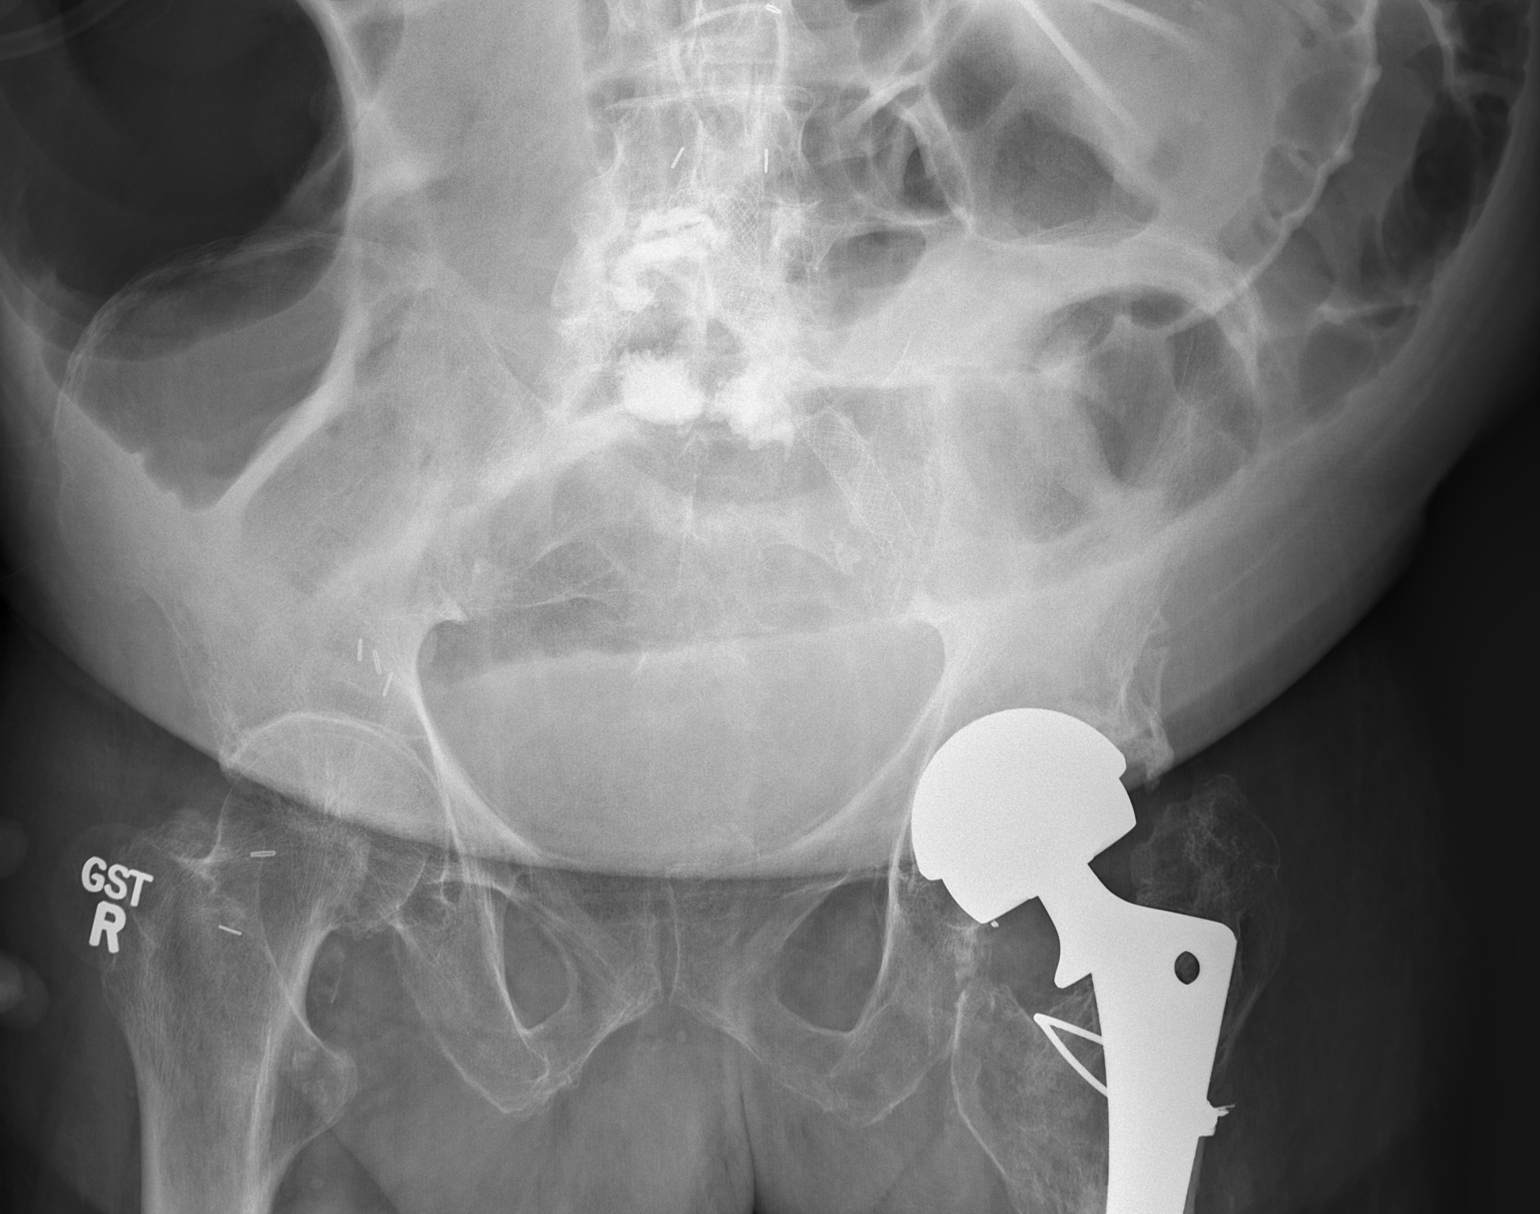

[2 of 2 positions shown; findings below may reference images not displayed]

FINDINGS: Diffuse colonic distention and interposition between the liver and
anterior abdominal wall are similar to the prior CT. The small bowel
appears mildly distended. There is no evidence of free
intraperitoneal air or pneumatosis. Atherosclerosis, bilateral iliac
artery stents and spinal augmentation changes within the lower
lumbar spine are noted. Patient is status post left hip
arthroplasty.
IMPRESSION: Similar distension of the small and large bowel compared with prior
CT, most consistent with chronic ileus. No new findings identified.

## 2015-09-24 IMAGING — CT CT ABD-PELV W/ CM
2 of 5 series · 15 of 46 positions shown, 17 images · IV contrast (APPLIED)
Comparison: CT of the abdomen and pelvis performed 03/20/2013

CLINICAL DATA: Generalized abdominal pain.  Diarrhea.

EXAM:
CT ABDOMEN AND PELVIS WITH CONTRAST
TECHNIQUE: Multidetector CT imaging of the abdomen and pelvis was performed
using the standard protocol following bolus administration of
intravenous contrast.
CONTRAST:  100mL OMNIPAQUE IOHEXOL 300 MG/ML  SOLN

[Series 2: abd/ pelvis 5.0 i30f 1 · axial · 0.98mm/px · z∈[+829,+1279]mm · 12 of 102 slices shown, 14 images]
[im 6/102  soft-tissue]
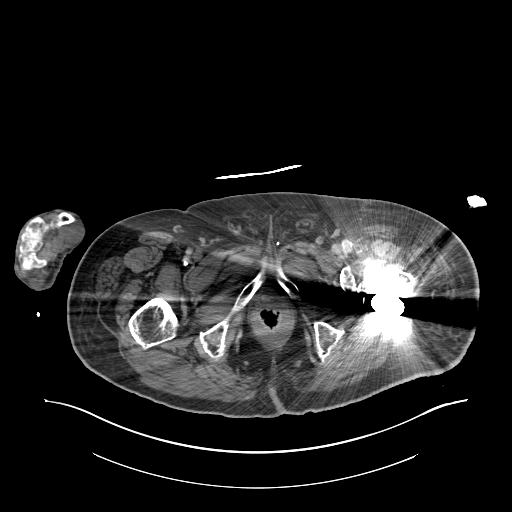
[im 6/102  bone]
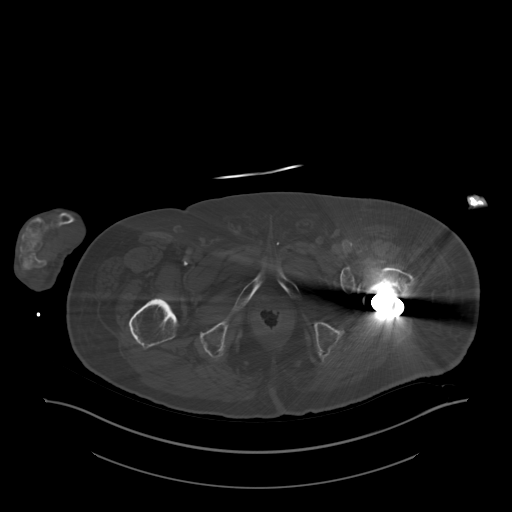
[im 17/102  soft-tissue]
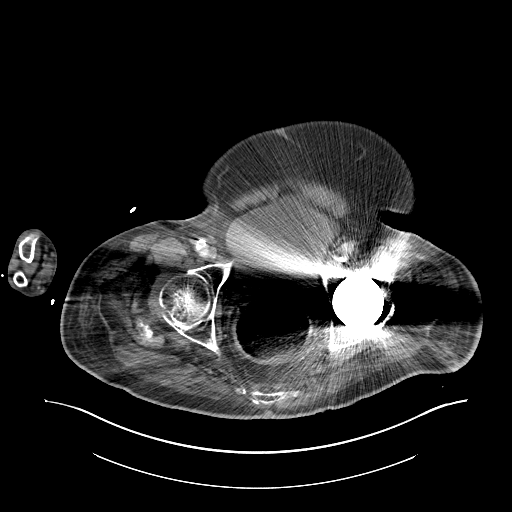
[im 23/102  soft-tissue]
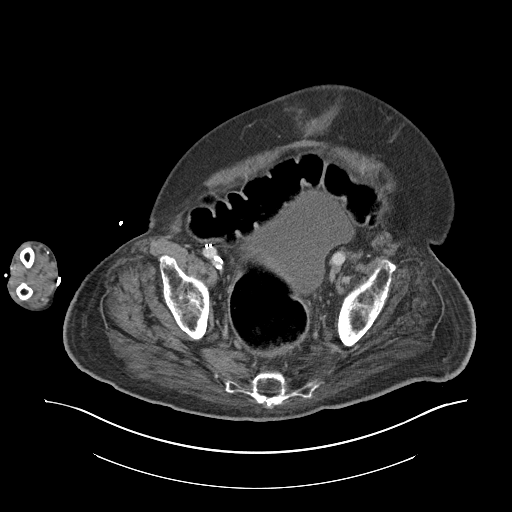
[im 29/102  soft-tissue]
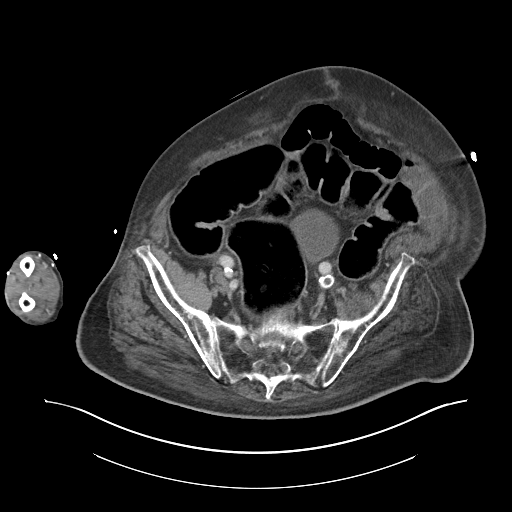
[im 40/102  soft-tissue]
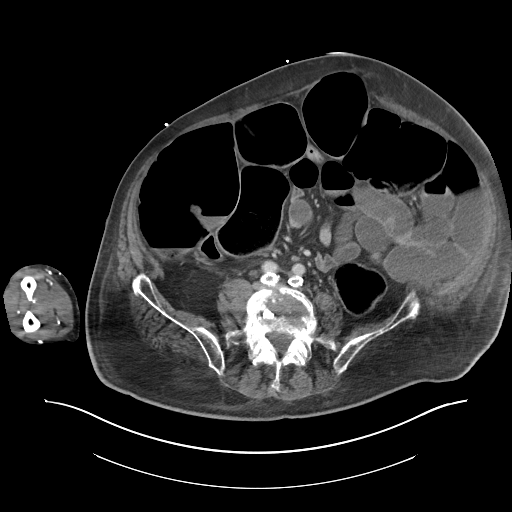
[im 45/102  soft-tissue]
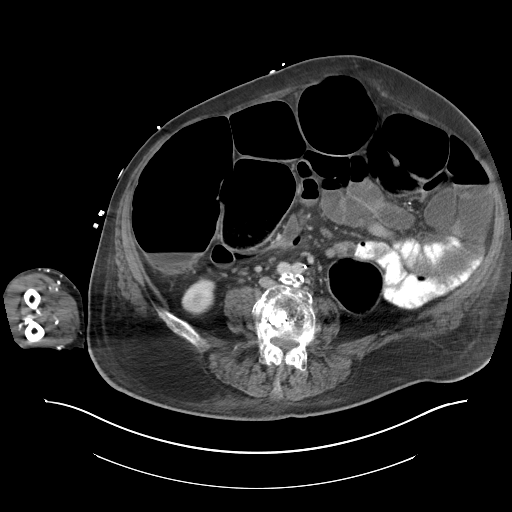
[im 57/102  soft-tissue]
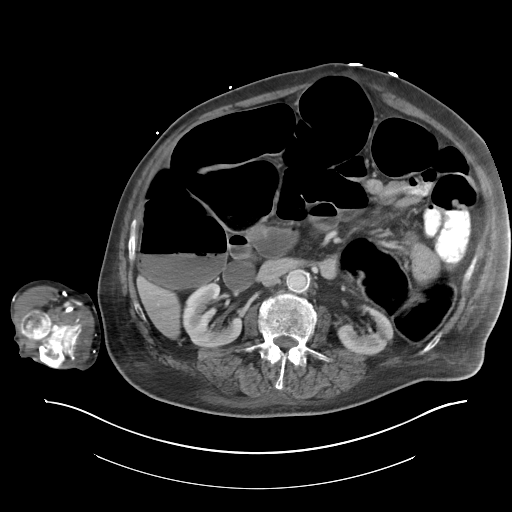
[im 62/102  soft-tissue]
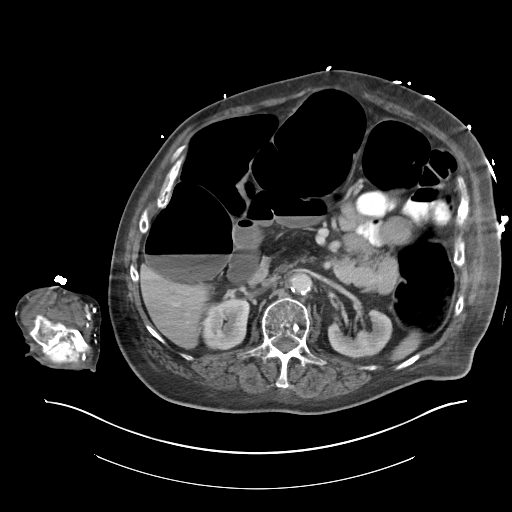
[im 73/102  soft-tissue]
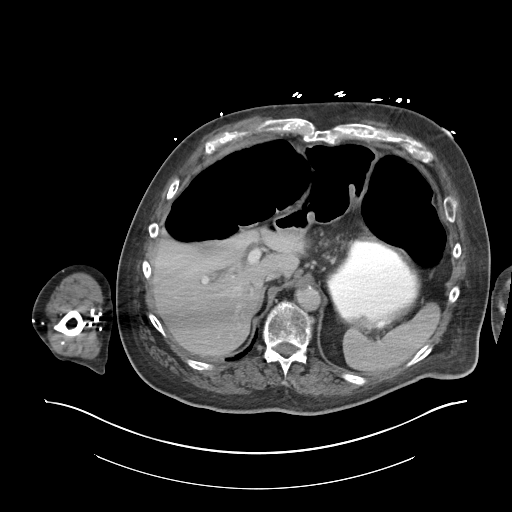
[im 73/102  bone]
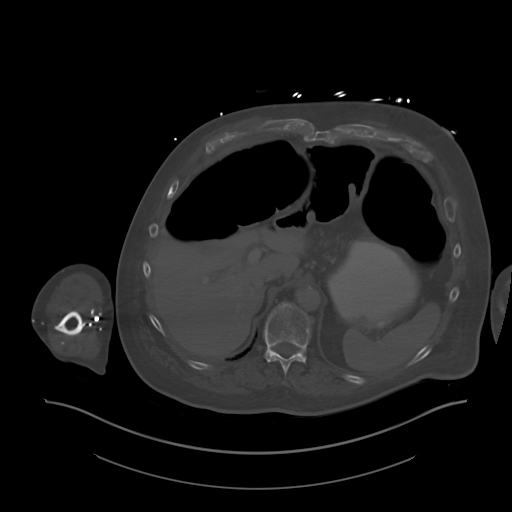
[im 79/102  soft-tissue]
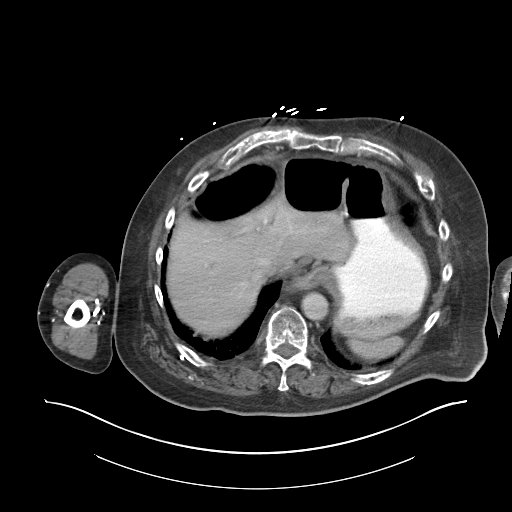
[im 85/102  soft-tissue]
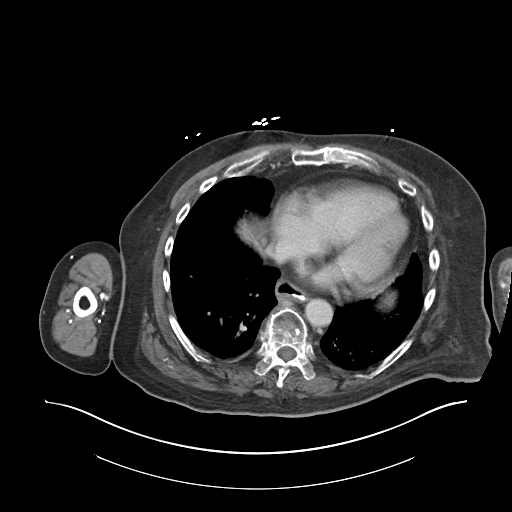
[im 96/102  soft-tissue]
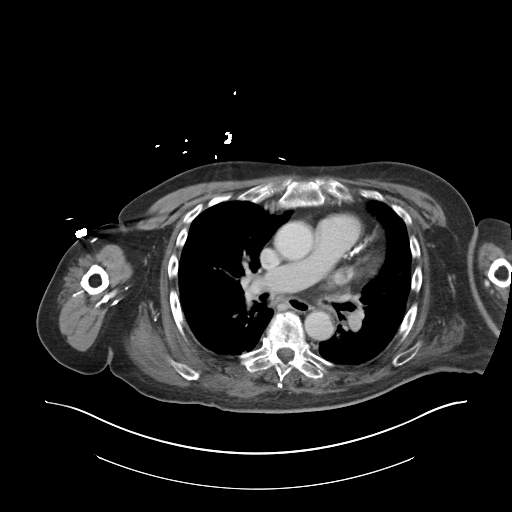

[Series 5: coronal soft tissue · coronal · 0.83mm/px · 3 of 117 slices shown]
[im 39/117  soft-tissue]
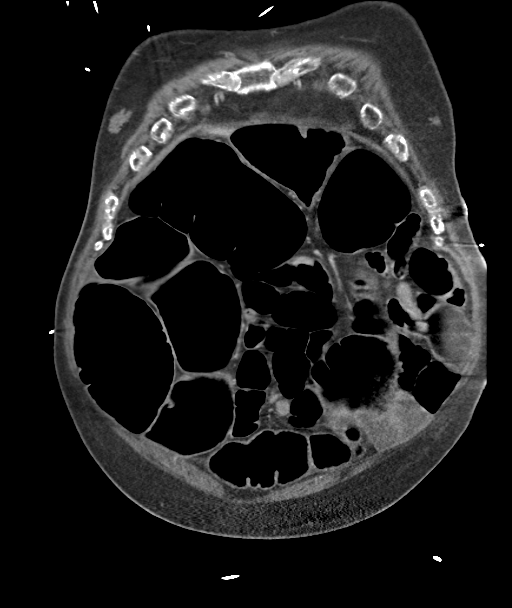
[im 52/117  soft-tissue]
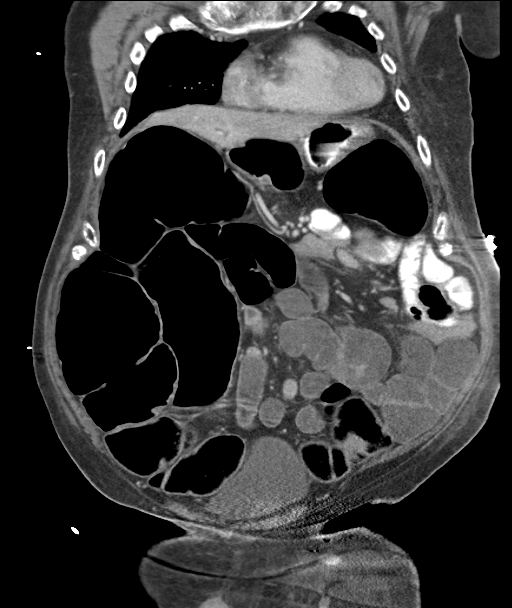
[im 65/117  soft-tissue]
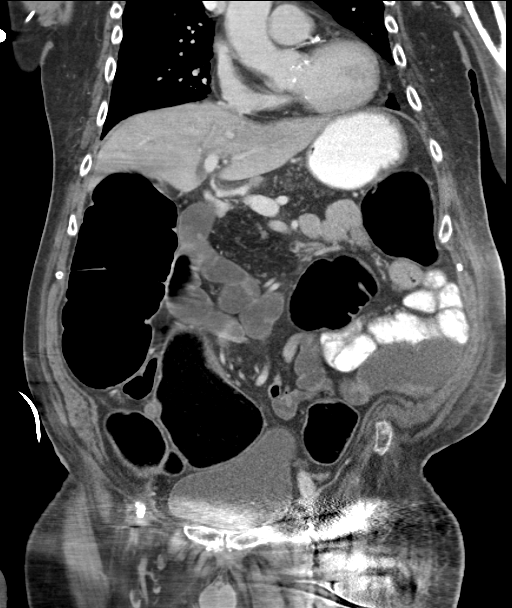

[15 of 46 positions shown; findings below may reference images not displayed]

FINDINGS: Atelectasis or scarring is noted at the lung bases. Diffuse coronary
artery calcifications are seen.

Prominence of the intrahepatic biliary ducts reflects prior
cholecystectomy. The liver is otherwise unremarkable in appearance.
The spleen is within normal limits. The pancreas and adrenal glands
are unremarkable.

Scattered small bilateral renal cysts are seen. The kidneys are
otherwise unremarkable in appearance. There is no evidence of
hydronephrosis. No renal or ureteral stones are seen. No perinephric
stranding is appreciated.

Scattered calcification is noted along the abdominal aorta and its
branches. An aortobifemoral bypass graft is noted; it appears
patent. However, significant calcification is noted at the distal
insertion of both limbs of the bypass graft.

Several small broad-based periumbilical hernias are seen, with
partial extension of a wall of the transverse colon into these
hernias.

No free fluid is identified. The small bowel is unremarkable in
appearance. The stomach is within normal limits. No acute vascular
abnormalities are seen.

The appendix is enlarged, measuring 1.2 cm, with mild surrounding
soft tissue inflammation and trace fluid. However, this is less
prominent than on the prior study, and may reflect the patient's
baseline.

The colon is diffusely distended with fluid and air, with question
of minimal pneumatosis along the ascending colon. The sigmoid colon
is mildly redundant. Findings raise concern for ileus.

The bladder is mildly distended and grossly unremarkable appearance.
The prostate is not well assessed due to metal artifact from the
patient's left hip arthroplasty. No inguinal lymphadenopathy is
seen.

No acute osseous abnormalities are identified. Chronic compression
deformities are seen at L4 and L5, with associated changes of
vertebroplasty.
IMPRESSION: 1. Diffuse distention of the colon with fluid and air, with question
of minimal pneumatosis along the ascending colon. Findings raise
concern for ileus.
2. Enlargement of the appendix to 1.2 cm in diameter, with mild
surrounding soft tissue inflammation and trace fluid. However, this
is less prominent than on the prior study in 2444, and may reflect
the patient's baseline.
3. Diffuse coronary artery calcifications seen.
4. Scattered small bilateral renal cysts seen.
5. Aortobifemoral bypass graft remains patent. However, significant
calcification is seen at the distal insertion of both limbs of the
bypass graft.
6. Several small broad-based periumbilical hernias noted, with
partial extension of a wall of the transverse colon into these
hernias.
7. Atelectasis or scarring at the lung bases.

## 2015-10-20 ENCOUNTER — Encounter (HOSPITAL_COMMUNITY): Payer: Self-pay | Admitting: *Deleted

## 2015-10-20 ENCOUNTER — Emergency Department (HOSPITAL_COMMUNITY): Payer: Medicare Other

## 2015-10-20 ENCOUNTER — Observation Stay (HOSPITAL_COMMUNITY)
Admission: EM | Admit: 2015-10-20 | Discharge: 2015-10-21 | Disposition: A | Discharge status: Home / Self Care | Payer: Medicare Other | Attending: Internal Medicine | Admitting: Internal Medicine

## 2015-10-20 ENCOUNTER — Observation Stay (HOSPITAL_COMMUNITY): Payer: Medicare Other

## 2015-10-20 ENCOUNTER — Emergency Department (HOSPITAL_BASED_OUTPATIENT_CLINIC_OR_DEPARTMENT_OTHER)
Admit: 2015-10-20 | Discharge: 2015-10-20 | Disposition: A | Discharge status: Home / Self Care | Payer: Medicare Other | Attending: Emergency Medicine | Admitting: Emergency Medicine

## 2015-10-20 DIAGNOSIS — F329 Major depressive disorder, single episode, unspecified: Secondary | ICD-10-CM | POA: Insufficient documentation

## 2015-10-20 DIAGNOSIS — Z794 Long term (current) use of insulin: Secondary | ICD-10-CM | POA: Insufficient documentation

## 2015-10-20 DIAGNOSIS — Z96642 Presence of left artificial hip joint: Secondary | ICD-10-CM | POA: Diagnosis not present

## 2015-10-20 DIAGNOSIS — M25552 Pain in left hip: Secondary | ICD-10-CM | POA: Diagnosis not present

## 2015-10-20 DIAGNOSIS — M21372 Foot drop, left foot: Secondary | ICD-10-CM | POA: Diagnosis not present

## 2015-10-20 DIAGNOSIS — E7800 Pure hypercholesterolemia, unspecified: Secondary | ICD-10-CM | POA: Insufficient documentation

## 2015-10-20 DIAGNOSIS — Z7901 Long term (current) use of anticoagulants: Secondary | ICD-10-CM | POA: Diagnosis not present

## 2015-10-20 DIAGNOSIS — E1165 Type 2 diabetes mellitus with hyperglycemia: Secondary | ICD-10-CM | POA: Insufficient documentation

## 2015-10-20 DIAGNOSIS — M1712 Unilateral primary osteoarthritis, left knee: Secondary | ICD-10-CM | POA: Insufficient documentation

## 2015-10-20 DIAGNOSIS — Z880 Allergy status to penicillin: Secondary | ICD-10-CM | POA: Insufficient documentation

## 2015-10-20 DIAGNOSIS — I739 Peripheral vascular disease, unspecified: Secondary | ICD-10-CM | POA: Diagnosis present

## 2015-10-20 DIAGNOSIS — H5442 Blindness, left eye, normal vision right eye: Secondary | ICD-10-CM | POA: Insufficient documentation

## 2015-10-20 DIAGNOSIS — R21 Rash and other nonspecific skin eruption: Secondary | ICD-10-CM

## 2015-10-20 DIAGNOSIS — M79609 Pain in unspecified limb: Secondary | ICD-10-CM

## 2015-10-20 DIAGNOSIS — I69354 Hemiplegia and hemiparesis following cerebral infarction affecting left non-dominant side: Secondary | ICD-10-CM | POA: Diagnosis not present

## 2015-10-20 DIAGNOSIS — R600 Localized edema: Secondary | ICD-10-CM | POA: Insufficient documentation

## 2015-10-20 DIAGNOSIS — M25559 Pain in unspecified hip: Secondary | ICD-10-CM | POA: Diagnosis present

## 2015-10-20 DIAGNOSIS — M79605 Pain in left leg: Secondary | ICD-10-CM | POA: Diagnosis not present

## 2015-10-20 DIAGNOSIS — E1151 Type 2 diabetes mellitus with diabetic peripheral angiopathy without gangrene: Secondary | ICD-10-CM | POA: Insufficient documentation

## 2015-10-20 DIAGNOSIS — N400 Enlarged prostate without lower urinary tract symptoms: Secondary | ICD-10-CM | POA: Insufficient documentation

## 2015-10-20 DIAGNOSIS — Z993 Dependence on wheelchair: Secondary | ICD-10-CM | POA: Diagnosis not present

## 2015-10-20 DIAGNOSIS — I4891 Unspecified atrial fibrillation: Secondary | ICD-10-CM | POA: Diagnosis not present

## 2015-10-20 DIAGNOSIS — G8929 Other chronic pain: Secondary | ICD-10-CM | POA: Diagnosis not present

## 2015-10-20 DIAGNOSIS — M79672 Pain in left foot: Secondary | ICD-10-CM | POA: Diagnosis not present

## 2015-10-20 DIAGNOSIS — Z885 Allergy status to narcotic agent status: Secondary | ICD-10-CM | POA: Insufficient documentation

## 2015-10-20 DIAGNOSIS — E039 Hypothyroidism, unspecified: Secondary | ICD-10-CM | POA: Diagnosis not present

## 2015-10-20 DIAGNOSIS — K861 Other chronic pancreatitis: Secondary | ICD-10-CM | POA: Diagnosis not present

## 2015-10-20 DIAGNOSIS — M7989 Other specified soft tissue disorders: Secondary | ICD-10-CM

## 2015-10-20 DIAGNOSIS — Z888 Allergy status to other drugs, medicaments and biological substances status: Secondary | ICD-10-CM | POA: Diagnosis not present

## 2015-10-20 DIAGNOSIS — I10 Essential (primary) hypertension: Secondary | ICD-10-CM | POA: Diagnosis not present

## 2015-10-20 DIAGNOSIS — Z87891 Personal history of nicotine dependence: Secondary | ICD-10-CM | POA: Diagnosis not present

## 2015-10-20 DIAGNOSIS — IMO0002 Reserved for concepts with insufficient information to code with codable children: Secondary | ICD-10-CM | POA: Diagnosis present

## 2015-10-20 LAB — GLUCOSE, CAPILLARY: Glucose-Capillary: 95 mg/dL (ref 65–99)

## 2015-10-20 LAB — CBC WITH DIFFERENTIAL/PLATELET
BASOS ABS: 0 10*3/uL (ref 0.0–0.1)
BASOS PCT: 0 %
EOS ABS: 0.1 10*3/uL (ref 0.0–0.7)
Eosinophils Relative: 1 %
HCT: 39.4 % (ref 39.0–52.0)
HEMOGLOBIN: 12.8 g/dL — AB (ref 13.0–17.0)
LYMPHS PCT: 17 %
Lymphs Abs: 1.9 10*3/uL (ref 0.7–4.0)
MCH: 30.9 pg (ref 26.0–34.0)
MCHC: 32.5 g/dL (ref 30.0–36.0)
MCV: 95.2 fL (ref 78.0–100.0)
Monocytes Absolute: 0.8 10*3/uL (ref 0.1–1.0)
Monocytes Relative: 7 %
Neutro Abs: 8.6 10*3/uL — ABNORMAL HIGH (ref 1.7–7.7)
Neutrophils Relative %: 75 %
Platelets: 214 10*3/uL (ref 150–400)
RBC: 4.14 MIL/uL — AB (ref 4.22–5.81)
RDW: 14.7 % (ref 11.5–15.5)
WBC: 11.4 10*3/uL — AB (ref 4.0–10.5)

## 2015-10-20 LAB — I-STAT CHEM 8, ED
BUN: 19 mg/dL (ref 6–20)
CALCIUM ION: 1.08 mmol/L — AB (ref 1.12–1.23)
Chloride: 99 mmol/L — ABNORMAL LOW (ref 101–111)
Creatinine, Ser: 0.9 mg/dL (ref 0.61–1.24)
Glucose, Bld: 109 mg/dL — ABNORMAL HIGH (ref 65–99)
HEMATOCRIT: 41 % (ref 39.0–52.0)
Hemoglobin: 13.9 g/dL (ref 13.0–17.0)
Potassium: 3.8 mmol/L (ref 3.5–5.1)
SODIUM: 138 mmol/L (ref 135–145)
TCO2: 29 mmol/L (ref 0–100)

## 2015-10-20 LAB — PROTIME-INR
INR: 1.2 (ref 0.00–1.49)
Prothrombin Time: 15.4 seconds — ABNORMAL HIGH (ref 11.6–15.2)

## 2015-10-20 MED ORDER — POLYETHYLENE GLYCOL 3350 17 G PO PACK
17.0000 g | PACK | Freq: Every day | ORAL | Status: DC
  Filled 2015-10-20: qty 1

## 2015-10-20 MED ORDER — TAMSULOSIN HCL 0.4 MG PO CAPS
0.4000 mg | ORAL_CAPSULE | Freq: Every day | ORAL | Status: DC
  Administered 2015-10-21: 0.4 mg via ORAL
  Filled 2015-10-20: qty 1

## 2015-10-20 MED ORDER — INSULIN ASPART 100 UNIT/ML ~~LOC~~ SOLN: 0.0000 [IU] | Freq: Three times a day (TID) | SUBCUTANEOUS | Status: DC

## 2015-10-20 MED ORDER — OXYCODONE-ACETAMINOPHEN 10-325 MG PO TABS: 1.0000 | ORAL_TABLET | Freq: Four times a day (QID) | ORAL | Status: DC | PRN

## 2015-10-20 MED ORDER — LIDOCAINE 5 % EX PTCH
1.0000 | MEDICATED_PATCH | CUTANEOUS | Status: DC
  Administered 2015-10-21: 1 via TRANSDERMAL
  Filled 2015-10-20: qty 1

## 2015-10-20 MED ORDER — FUROSEMIDE 40 MG PO TABS
40.0000 mg | ORAL_TABLET | Freq: Every morning | ORAL | Status: DC
  Administered 2015-10-21: 40 mg via ORAL
  Filled 2015-10-20: qty 1

## 2015-10-20 MED ORDER — LIDOCAINE 5 % EX PTCH
1.0000 | MEDICATED_PATCH | CUTANEOUS | Status: AC
Start: 2015-10-20 — End: 2015-10-21
  Administered 2015-10-20: 1 via TRANSDERMAL
  Filled 2015-10-20: qty 1

## 2015-10-20 MED ORDER — AMIODARONE HCL 200 MG PO TABS
200.0000 mg | ORAL_TABLET | Freq: Every day | ORAL | Status: DC
  Administered 2015-10-20 – 2015-10-21 (×2): 200 mg via ORAL
  Filled 2015-10-20 (×2): qty 1

## 2015-10-20 MED ORDER — FENTANYL CITRATE (PF) 100 MCG/2ML IJ SOLN
50.0000 ug | Freq: Once | INTRAMUSCULAR | Status: AC
  Administered 2015-10-20: 50 ug via INTRAVENOUS
  Filled 2015-10-20: qty 2

## 2015-10-20 MED ORDER — OXYCODONE HCL 5 MG PO TABS
5.0000 mg | ORAL_TABLET | Freq: Four times a day (QID) | ORAL | Status: DC | PRN
  Administered 2015-10-21: 5 mg via ORAL
  Filled 2015-10-20: qty 1

## 2015-10-20 MED ORDER — OXYCODONE-ACETAMINOPHEN 5-325 MG PO TABS
1.0000 | ORAL_TABLET | Freq: Four times a day (QID) | ORAL | Status: DC | PRN
  Administered 2015-10-21: 1 via ORAL
  Filled 2015-10-20: qty 1

## 2015-10-20 MED ORDER — APIXABAN 5 MG PO TABS
5.0000 mg | ORAL_TABLET | Freq: Two times a day (BID) | ORAL | Status: DC
  Administered 2015-10-20 – 2015-10-21 (×2): 5 mg via ORAL
  Filled 2015-10-20 (×2): qty 1

## 2015-10-20 MED ORDER — LEVOTHYROXINE SODIUM 200 MCG PO TABS
200.0000 ug | ORAL_TABLET | Freq: Every day | ORAL | Status: DC
  Administered 2015-10-21: 200 ug via ORAL
  Filled 2015-10-20: qty 1
  Filled 2015-10-20: qty 2

## 2015-10-20 MED ORDER — MORPHINE SULFATE (PF) 4 MG/ML IV SOLN
8.0000 mg | Freq: Once | INTRAVENOUS | Status: AC
  Administered 2015-10-20: 8 mg via INTRAVENOUS
  Filled 2015-10-20: qty 2

## 2015-10-20 MED ORDER — METOPROLOL SUCCINATE ER 25 MG PO TB24
12.5000 mg | ORAL_TABLET | Freq: Every day | ORAL | Status: DC
  Administered 2015-10-21: 12.5 mg via ORAL
  Filled 2015-10-20: qty 1

## 2015-10-20 MED ORDER — CILOSTAZOL 50 MG PO TABS
50.0000 mg | ORAL_TABLET | Freq: Two times a day (BID) | ORAL | Status: DC
  Administered 2015-10-20 – 2015-10-21 (×2): 50 mg via ORAL
  Filled 2015-10-20 (×2): qty 1

## 2015-10-20 MED ORDER — INSULIN GLARGINE 100 UNIT/ML ~~LOC~~ SOLN
10.0000 [IU] | Freq: Every day | SUBCUTANEOUS | Status: DC
  Administered 2015-10-20: 10 [IU] via SUBCUTANEOUS
  Filled 2015-10-20 (×2): qty 0.1
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
## 2015-10-20 NOTE — ED Notes (Signed)
Report given to 5N RN-- pt transported to xray-

## 2015-10-20 NOTE — ED Notes (Signed)
Pt placed on 2L O2 before 8mg  morphine administered.
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
## 2015-10-20 NOTE — ED Notes (Signed)
Pt had a left hip replacement several years ago.  Pt began to have severe pain in that hip last night. Pts wife bought him to the ER today for follow-up.

## 2015-10-20 NOTE — Progress Notes (Signed)
*  PRELIMINARY RESULTS* Vascular Ultrasound Left lower extremity venous duplex has been completed.  Preliminary findings: Technically difficult due to patient position and pain level. Patient very upset about having this test done and almost refused. Unable to visualize all vein segments, however no obvious is DVT noted in LLE.    Farrel DemarkJill Eunice, RDMS, RVT  10/20/2015, 11:42 AM

## 2015-10-21 ENCOUNTER — Observation Stay (HOSPITAL_COMMUNITY): Payer: Medicare Other

## 2015-10-21 DIAGNOSIS — M25552 Pain in left hip: Secondary | ICD-10-CM

## 2015-10-21 DIAGNOSIS — Z96642 Presence of left artificial hip joint: Secondary | ICD-10-CM | POA: Diagnosis not present

## 2015-10-21 LAB — CBC
HEMATOCRIT: 36.7 % — AB (ref 39.0–52.0)
HEMOGLOBIN: 11.8 g/dL — AB (ref 13.0–17.0)
MCH: 30.6 pg (ref 26.0–34.0)
MCHC: 32.2 g/dL (ref 30.0–36.0)
MCV: 95.1 fL (ref 78.0–100.0)
Platelets: 197 10*3/uL (ref 150–400)
RBC: 3.86 MIL/uL — ABNORMAL LOW (ref 4.22–5.81)
RDW: 14.8 % (ref 11.5–15.5)
WBC: 8 10*3/uL (ref 4.0–10.5)

## 2015-10-21 LAB — BASIC METABOLIC PANEL
ANION GAP: 9 (ref 5–15)
BUN: 15 mg/dL (ref 6–20)
CO2: 26 mmol/L (ref 22–32)
Calcium: 8.4 mg/dL — ABNORMAL LOW (ref 8.9–10.3)
Chloride: 102 mmol/L (ref 101–111)
Creatinine, Ser: 1.01 mg/dL (ref 0.61–1.24)
GFR calc Af Amer: >60 mL/min (ref >60)
Glucose, Bld: 84 mg/dL (ref 65–99)
POTASSIUM: 3.8 mmol/L (ref 3.5–5.1)
Sodium: 137 mmol/L (ref 135–145)

## 2015-10-21 LAB — GLUCOSE, CAPILLARY
GLUCOSE-CAPILLARY: 86 mg/dL (ref 65–99)
GLUCOSE-CAPILLARY: 314 mg/dL — AB (ref 65–99)
Glucose-Capillary: 106 mg/dL — ABNORMAL HIGH (ref 65–99)

## 2015-10-21 MED ORDER — LIDOCAINE 5 % EX PTCH: 1.0000 | MEDICATED_PATCH | CUTANEOUS | Status: DC

## 2015-10-21 MED ORDER — SACCHAROMYCES BOULARDII 250 MG PO CAPS: 250.0000 mg | ORAL_CAPSULE | Freq: Every morning | ORAL | Status: AC

## 2015-10-21 MED ORDER — OXYCODONE HCL 5 MG PO TABS: 5.0000 mg | ORAL_TABLET | Freq: Four times a day (QID) | ORAL | Status: DC | PRN

## 2015-10-21 MED ORDER — NAPROXEN 500 MG PO TABS: 500.0000 mg | ORAL_TABLET | Freq: Two times a day (BID) | ORAL | Status: AC

## 2015-10-21 MED ORDER — OXYCODONE-ACETAMINOPHEN 5-325 MG PO TABS
1.0000 | ORAL_TABLET | Freq: Four times a day (QID) | ORAL | Status: DC | PRN
  Administered 2015-10-21: 2 via ORAL
  Filled 2015-10-21: qty 2

## 2015-10-21 MED ORDER — MORPHINE SULFATE (PF) 2 MG/ML IV SOLN
2.0000 mg | Freq: Once | INTRAVENOUS | Status: AC
Start: 2015-10-21 — End: 2015-10-21
  Administered 2015-10-21: 2 mg via INTRAVENOUS
  Filled 2015-10-21: qty 1

## 2015-10-21 MED ORDER — DICLOFENAC SODIUM 1 % TD GEL
4.0000 g | Freq: Four times a day (QID) | TRANSDERMAL | Status: DC
  Administered 2015-10-21: 4 g via TOPICAL
  Filled 2015-10-21: qty 100

## 2015-10-21 MED ORDER — INSULIN GLARGINE 100 UNIT/ML ~~LOC~~ SOLN: 20.0000 [IU] | Freq: Every day | SUBCUTANEOUS | Status: AC

## 2015-10-21 MED ORDER — DICLOFENAC SODIUM 1 % TD GEL: 4.0000 g | Freq: Four times a day (QID) | TRANSDERMAL | Status: AC
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
## 2015-10-21 NOTE — Clinical Social Work Note (Signed)
Received call from RN. Patient does not have transportation home and would PTAR instead of bus or cab voucher. RN is still trying to see if family can pick him up or not. CSW advised the RNCM would facilitate PTAR if patient returning home. CSWs only set up PTAR if patient going to a facility.   Charlynn CourtSarah Marchel Foote, CSW [phone]

## 2015-10-21 NOTE — Progress Notes (Signed)
Patient went down to CT prior to pain med given. Will still give at this time for patient is complaining of severe pain.
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
## 2015-10-21 NOTE — Care Management Obs Status (Signed)
MEDICARE OBSERVATION STATUS NOTIFICATION   Patient Details  Name: [name] Distler MRN: [id number] Date of Birth: 09/13/1935   Medicare Observation Status Notification Given:  Yes    Hanley HaysDowell, Camela Wich T, RN 10/21/2015, 3:12 PM

## 2015-10-21 NOTE — Consult Note (Signed)
WOC wound consult note Reason for Consult: LLE rash WOC reviewed chart, no family in the room.  Is reported in the chart to be new.  Patient is not able to give me any history.  Wound type: Partial thickness skin changes, left dorsal foot and left pretibial.  Patient does have 2+ pitting edema, and foot drop but not sure if the edema is chronic for this patient. Palpable pulse.  Measurement: pretibial 10cm x 7cm x 0cm; dorsal foot 5cm x 4.5cm x 0 Wound bed: Pretibial area is red but not open, no weeping Dorsal foot is a raised area with 25% yellow tissue but not open, not fluctuant.  NO drainage.  Drainage (amount, consistency, odor) none Periwound: some mild local erythema  Dressing procedure/placement/frequency: no topical care at this time.  If persist after hospital stay, would recommend follow up with dermatologist.    Discussed POC with patient and bedside nurse.  Re consult if needed, will not follow at this time. Thanks  Jeanie Mccard M.D.[employer]. [employer] MSN, RN,CNS, CWOCN [phone])
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
## 2015-10-21 NOTE — Progress Notes (Signed)
  Date: 10/21/2015  Patient name: Joshua BarretteWilliam C Snow  Medical record number: [id number]  Date of birth: 01/10/1936   I have seen and evaluated [name] and discussed their care with the Residency Team. In brief, patient is a 80 y/o male with PMH of CVA with residual left sided hemiparesis, PVD s/p b/l iliac stenting and aortobifemoral bypass, DM, afib on a/c, HTN, s/p L THA who p/w acutely worsening left hip pain * 1 day. Patient is on pain meds at home (percocet 10/325 mg) for chronic pain. States that he developed acutely worsening pain the day PTA and it was not relieved with his usual pain meds. Denies fevers, no CP, no SOB, no palpitations, no n/v, no diarrhea. His wife brought him to the ED secondary to persistence of pain.   Patient has chronic LE swelling L >R . Doppler in ED negative for DVT.  PMHx, Fam Hx, and/or Soc Hx : as per resident admit note  Filed Vitals:   10/21/15 0619 10/21/15 0956  BP: 126/49 128/93  Pulse: 65 66  Temp: 98.3 F (36.8 C)   Resp: 18    Gen: AAO*3, NAD CVS: RRR, normal heart sounds Lungs: CTA b/l Abd: soft, non tender, BS + Ext: 2 - 3 + b/l LE pitting edema L >R, erythematous rash over tibia and dorsum of his foot - non tender, no discharge, no increased warmth   Assessment and Plan: I have seen and evaluated the patient as outlined above. I agree with the formulated Assessment and Plan as detailed in the residents' note, with the following changes:   1. L hip pain: - I suspect this is MSK pain- no evidence of infection, no displacement of hardware on CT or X ray, no fluid collection suggestive of an abscess. Pain appears to have improved since admission. - c/w pain control - Ortho f/u appreciated - will get PT/OT eval - likely d/c home today   Joshua LagosNischal Annabel Gibeau, MD 6/30/201712:13 PM

## 2015-10-21 NOTE — Discharge Instructions (Signed)
Please continue the voltaren gel and lidocaine patch for your hip pain along with your regular pain medication. You can also try Naproxen 500 mg with meals to help with your pain.  Hip Pain Your hip is the joint between your upper legs and your lower pelvis. The bones, cartilage, tendons, and muscles of your hip joint perform a lot of work each day supporting your body weight and allowing you to move around. Hip pain can range from a minor ache to severe pain in one or both of your hips. Pain may be felt on the inside of the hip joint near the groin, or the outside near the buttocks and upper thigh. You may have swelling or stiffness as well.  HOME CARE INSTRUCTIONS   Take medicines only as directed by your health care provider.  Apply ice to the injured area:  Put ice in a plastic bag.  Place a towel between your skin and the bag.  Leave the ice on for 15-20 minutes at a time, 3-4 times a day.  Keep your leg raised (elevated) when possible to lessen swelling.  Avoid activities that cause pain.  Follow specific exercises as directed by your health care provider.  Sleep with a pillow between your legs on your most comfortable side.  Record how often you have hip pain, the location of the pain, and what it feels like. SEEK MEDICAL CARE IF:   You are unable to put weight on your leg.  Your hip is red or swollen or very tender to touch.  Your pain or swelling continues or worsens after 1 week.  You have increasing difficulty walking.  You have a fever. SEEK IMMEDIATE MEDICAL CARE IF:   You have fallen.  You have a sudden increase in pain and swelling in your hip. MAKE SURE YOU:   Understand these instructions.  Will watch your condition.  Will get help right away if you are not doing well or get worse.   This information is not intended to replace advice given to you by your health care provider. Make sure you discuss any questions you have with your health care  provider.   Document Released: 09/27/2009 Document Revised: 04/30/2014 Document Reviewed: 12/04/2012 Elsevier Interactive Patient Education 2016 [employer]  Information on my medicine - ELIQUIS (apixaban)  This medication education was reviewed with me or my healthcare representative as part of my discharge preparation.  The pharmacist that spoke with me during my hospital stay was:  Laurena BeringStramoski, Earle J, [facility]  Why was Eliquis prescribed for you? Eliquis was prescribed for you to reduce the risk of blood clots forming after orthopedic surgery.    What do You need to know about Eliquis? Take your Eliquis TWICE DAILY - one tablet in the morning and one tablet in the evening with or without food.  It would be best to take the dose about the same time each day.  If you have difficulty swallowing the tablet whole please discuss with your pharmacist how to take the medication safely.  Take Eliquis exactly as prescribed by your doctor and DO NOT stop taking Eliquis without talking to the doctor who prescribed the medication.  Stopping without other medication to take the place of Eliquis may increase your risk of developing a clot.  After discharge, you should have regular check-up appointments with your healthcare provider that is prescribing your Eliquis.  What do you do if you miss a dose? If a dose of ELIQUIS is not taken  at the scheduled time, take it as soon as possible on the same day and twice-daily administration should be resumed.  The dose should not be doubled to make up for a missed dose.  Do not take more than one tablet of ELIQUIS at the same time.  Important Safety Information A possible side effect of Eliquis is bleeding. You should call your healthcare provider right away if you experience any of the following: ? Bleeding from an injury or your nose that does not stop. ? Unusual colored urine (red or dark brown) or unusual colored stools (red or black). ? Unusual  bruising for unknown reasons. ? A serious fall or if you hit your head (even if there is no bleeding).  Some medicines may interact with Eliquis and might increase your risk of bleeding or clotting while on Eliquis. To help avoid this, consult your healthcare provider or pharmacist prior to using any new prescription or non-prescription medications, including herbals, vitamins, non-steroidal anti-inflammatory drugs (NSAIDs) and supplements.  This website has more information on Eliquis (apixaban): http://www.eliquis.com/eliquis/home

## 2015-10-21 NOTE — Progress Notes (Signed)
   Subjective: Patient feels better this morning compared to yesterday, but does still have intermittent pain. His home dose pain meds are providing some relief. He is afebrile overnight.  Objective: Vital signs in last 24 hours: Filed Vitals:   10/20/15 1800 10/20/15 2139 10/21/15 0619 10/21/15 0956  BP: 126/80 146/80 126/49 128/93  Pulse: 61 70 65 66  Temp:  98.2 F (36.8 C) 98.3 F (36.8 C)   TempSrc:  Oral Oral   Resp:  16 18   SpO2: 96% 94% 94%    Weight change:   Intake/Output Summary (Last 24 hours) at 10/21/15 1153 Last data filed at 10/21/15 [id number]  Gross per 24 hour  Intake      0 ml  Output   1100 ml  Net  -1100 ml   General: resting in bed, no acute distress, pleasant Cardiac: distant heart sounds, RRR, no rubs, murmurs or gallops Pulm: rhonchorous sounds throughout anteriorly Abd: soft, nontender, nondistended, BS present Ext: +3 pitting edema LLE, +2 pitting edema RLE; erythematous lesions pretibial and dorsum of foot on LLE with raised granulous tissue; no active drainage, warmth, fluctuance, warmth, or tenderness Neuro: alert and oriented X3, cranial nerves II-XII grossly intact   Assessment/Plan: Principal Problem:   Hip pain Active Problems:   Uncontrolled type 2 diabetes mellitus with peripheral circulatory disorder (HCC)   Atrial fibrillation (HCC)   Hypercholesterolemia   Peripheral arterial disease (HCC)  Left hip pain: Patient with significant past surgical history of left hip with total hip arthroplasty in 2009. He is wheelchair bound due to left sided hemiparesis 2/2 CVA. Plain film of the left hip was without acute abnormality. He has some improvement in pain overnight with his home dose medications, but still has breakthrough pain. 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 Will continue with pain control. -Orthopedics following, appreciate assistance and further  recommendations -Percocet 5-325 mg 1-2 tabs po q6h prn -Oxycodone IR 5 mg po q6h prn -Lidocaine patch, Kpad -Voltaren gel -PT/OT  LLE skin changes: LLE foot drop and edema is chronic per wife. One week of non-purulent erythematous soft tissue skin changes on dorsum of left foot and pretibial area. Raised granulous tissue present without drainage or fluctuance. No ulceration into superficial tissues.He is afebrile, without leukocytosis, and plain films of left foot and tibia/fibula are without evidence of osteomyelitis. Will hold off on antibiotics at this time. -Appreciate WOC consultation -CTM -Consider outpatient dermatology referral  T2DM: -Lantus 10 units qhs -SSI-S  Afib: CHADSVASc 7. RRR on exam. -Continue home Amiodarone 200 mg po daily -Continue home Toprol-XL 24 hr 12.5 mg daily -Continue home Eliquis 5 mg po BID  Dispo: Disposition is deferred at this time, awaiting improvement of current medical problems.  Anticipated discharge in approximately 1 day(s).   The patient does have a current PCP (Ailene RavelMaura L Hamrick, MD) and [facility] after [facility].  The patient does have transportation limitations that hinder transportation to clinic appointments.      Darreld McleanVishal Kaprice Kage, MD 10/21/2015, 11:53 AM

## 2015-11-03 DIAGNOSIS — E1165 Type 2 diabetes mellitus with hyperglycemia: Secondary | ICD-10-CM | POA: Diagnosis not present

## 2015-11-03 DIAGNOSIS — R609 Edema, unspecified: Secondary | ICD-10-CM | POA: Diagnosis not present

## 2015-11-03 DIAGNOSIS — Z79899 Other long term (current) drug therapy: Secondary | ICD-10-CM | POA: Diagnosis not present

## 2015-11-03 DIAGNOSIS — M25552 Pain in left hip: Secondary | ICD-10-CM | POA: Diagnosis not present

## 2015-11-03 DIAGNOSIS — L03116 Cellulitis of left lower limb: Secondary | ICD-10-CM | POA: Diagnosis not present

## 2015-11-14 DIAGNOSIS — M25552 Pain in left hip: Secondary | ICD-10-CM | POA: Diagnosis not present

## 2015-11-16 DIAGNOSIS — L03116 Cellulitis of left lower limb: Secondary | ICD-10-CM | POA: Diagnosis not present

## 2015-11-23 DIAGNOSIS — Z79899 Other long term (current) drug therapy: Secondary | ICD-10-CM | POA: Diagnosis not present

## 2015-11-23 DIAGNOSIS — L03116 Cellulitis of left lower limb: Secondary | ICD-10-CM | POA: Diagnosis not present

## 2015-11-23 DIAGNOSIS — E7800 Pure hypercholesterolemia, unspecified: Secondary | ICD-10-CM | POA: Diagnosis not present

## 2015-11-23 DIAGNOSIS — E1165 Type 2 diabetes mellitus with hyperglycemia: Secondary | ICD-10-CM | POA: Diagnosis not present

## 2015-11-23 DIAGNOSIS — Z9181 History of falling: Secondary | ICD-10-CM | POA: Diagnosis not present

## 2015-11-30 DIAGNOSIS — E119 Type 2 diabetes mellitus without complications: Secondary | ICD-10-CM | POA: Diagnosis not present

## 2015-11-30 DIAGNOSIS — Z471 Aftercare following joint replacement surgery: Secondary | ICD-10-CM | POA: Diagnosis not present

## 2015-11-30 DIAGNOSIS — Z96642 Presence of left artificial hip joint: Secondary | ICD-10-CM | POA: Diagnosis not present

## 2015-11-30 DIAGNOSIS — M1712 Unilateral primary osteoarthritis, left knee: Secondary | ICD-10-CM | POA: Diagnosis not present

## 2015-11-30 DIAGNOSIS — M25552 Pain in left hip: Secondary | ICD-10-CM | POA: Diagnosis not present

## 2015-11-30 DIAGNOSIS — I10 Essential (primary) hypertension: Secondary | ICD-10-CM | POA: Diagnosis not present

## 2015-11-30 DIAGNOSIS — M85862 Other specified disorders of bone density and structure, left lower leg: Secondary | ICD-10-CM | POA: Diagnosis not present

## 2015-12-01 DIAGNOSIS — L03116 Cellulitis of left lower limb: Secondary | ICD-10-CM | POA: Diagnosis not present

## 2015-12-01 DIAGNOSIS — R609 Edema, unspecified: Secondary | ICD-10-CM | POA: Diagnosis not present

## 2015-12-03 IMAGING — CT CT ABD-PELV W/ CM
2 of 5 series · 11 of 46 positions shown, 12 images · IV contrast (Iodine)
Comparison: 11/10/2013 and earlier.

CLINICAL DATA: 77-year-old male with abdominal distension and pain.
Left lower abdominal pain since last night. Initial encounter.

EXAM:
CT ABDOMEN AND PELVIS WITH CONTRAST
TECHNIQUE: Multidetector CT imaging of the abdomen and pelvis was performed
using the standard protocol following bolus administration of
intravenous contrast.
CONTRAST:  80mL OMNIPAQUE IOHEXOL 300 MG/ML  SOLN

[Series 201: routine, idose (2) · axial · 0.95mm/px · z∈[-409,-59]mm · 8 of 90 slices shown, 9 images]
[im 10/90  soft-tissue]
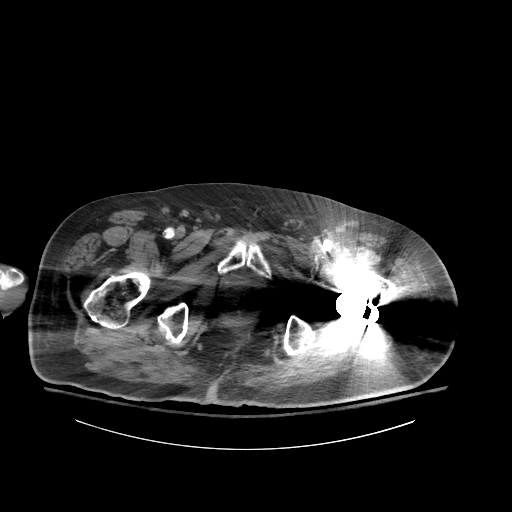
[im 10/90  bone]
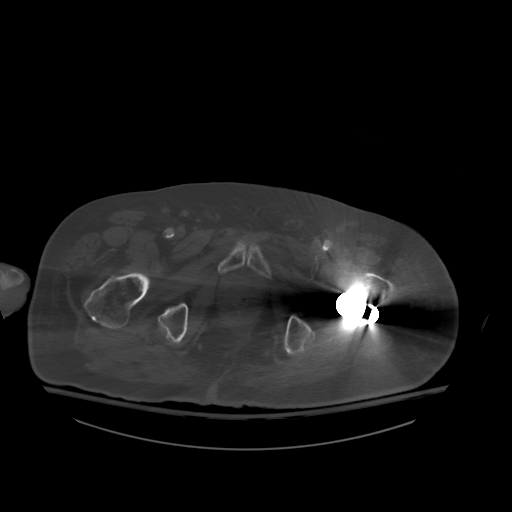
[im 19/90  soft-tissue]
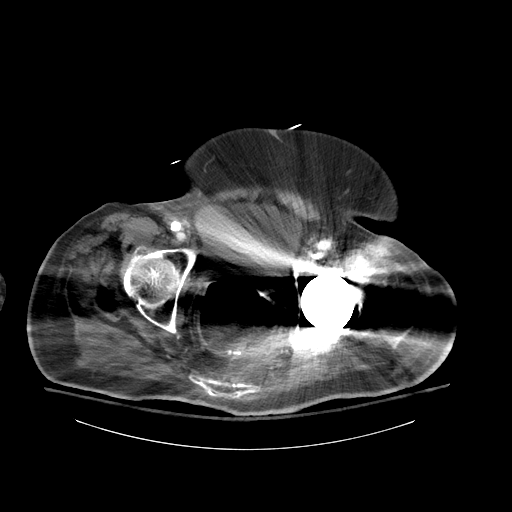
[im 29/90  soft-tissue]
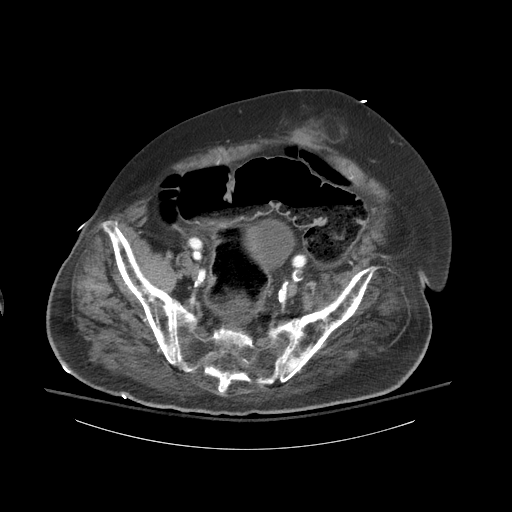
[im 38/90  soft-tissue]
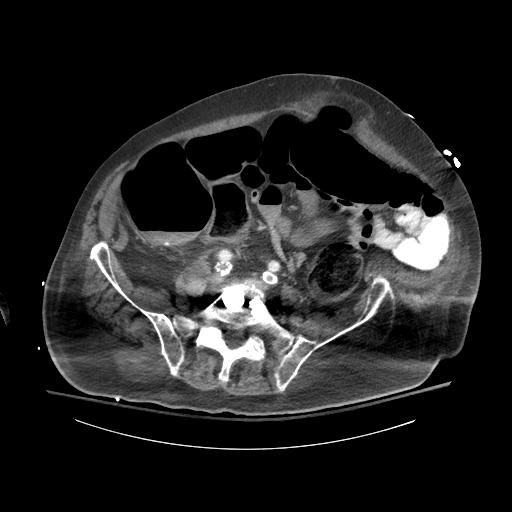
[im 52/90  soft-tissue]
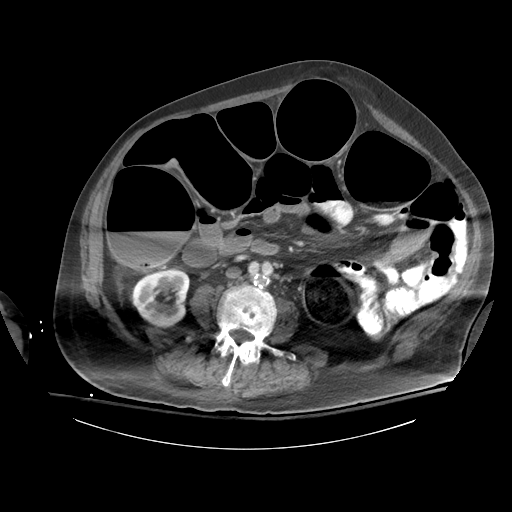
[im 61/90  soft-tissue]
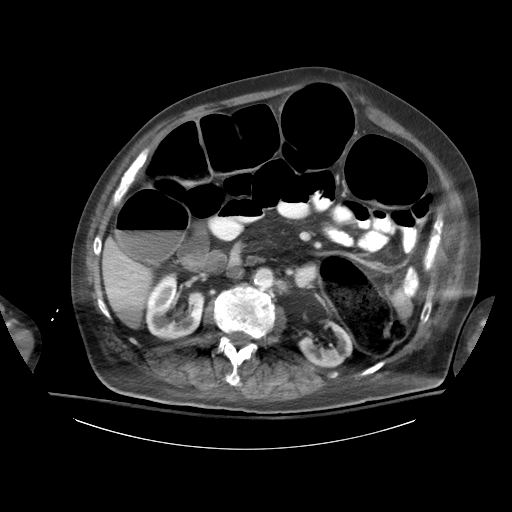
[im 71/90  soft-tissue]
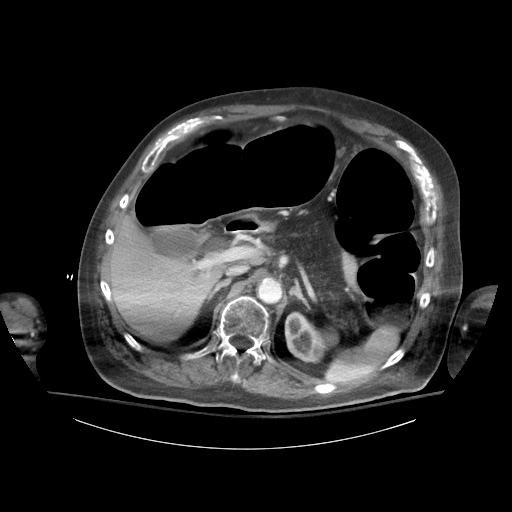
[im 80/90  soft-tissue]
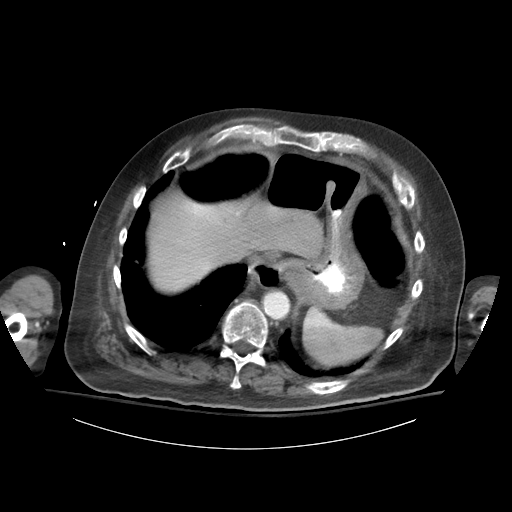

[Series 202: coronals, idose (2) · coronal · 0.50mm/px · 3 of 163 slices shown]
[im 55/163  soft-tissue]
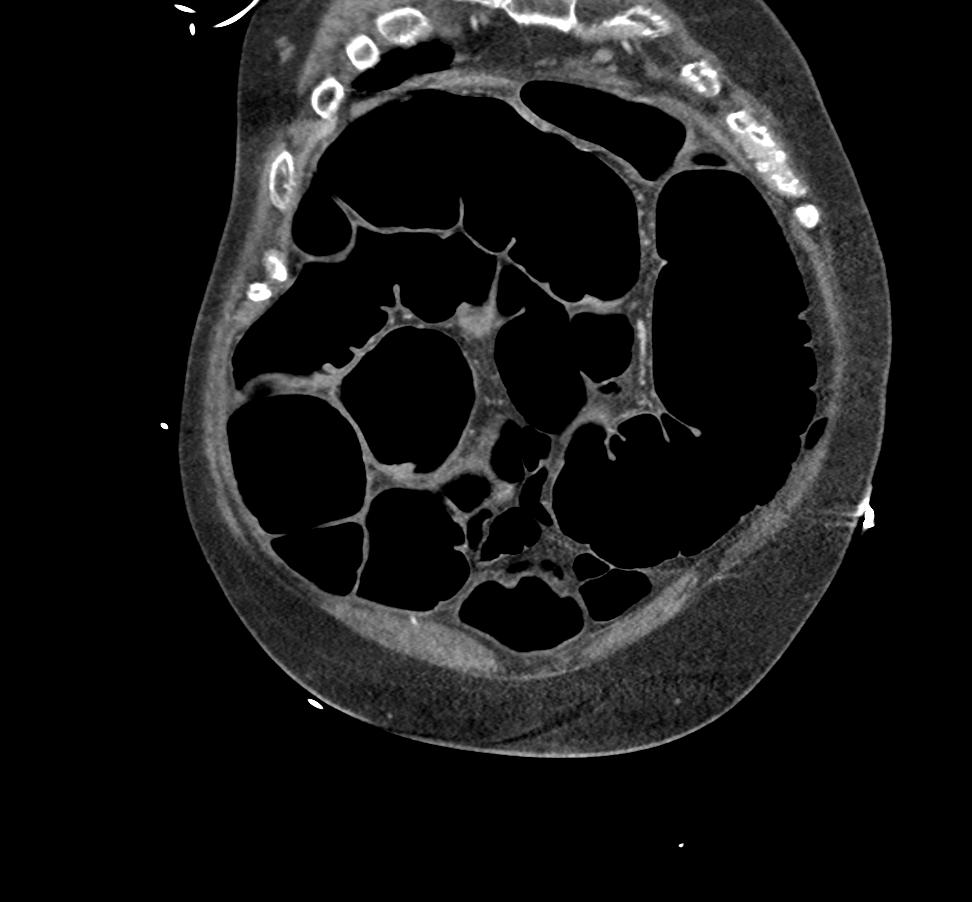
[im 73/163  soft-tissue]
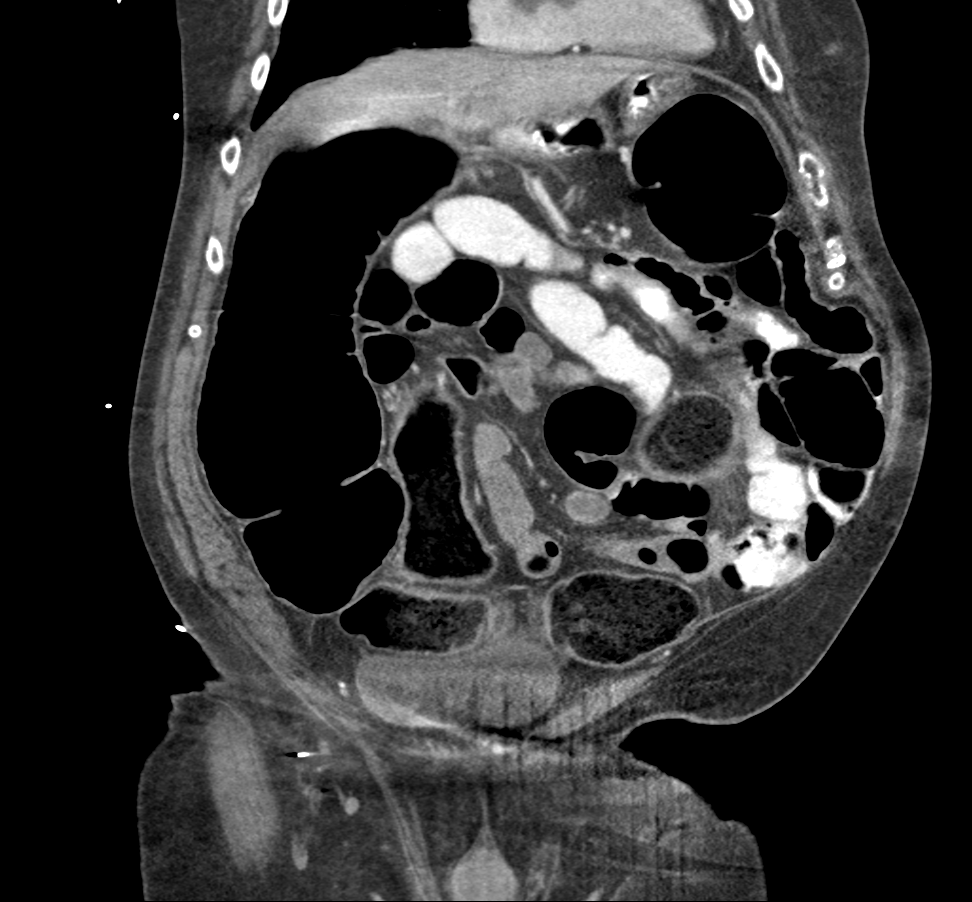
[im 91/163  soft-tissue]
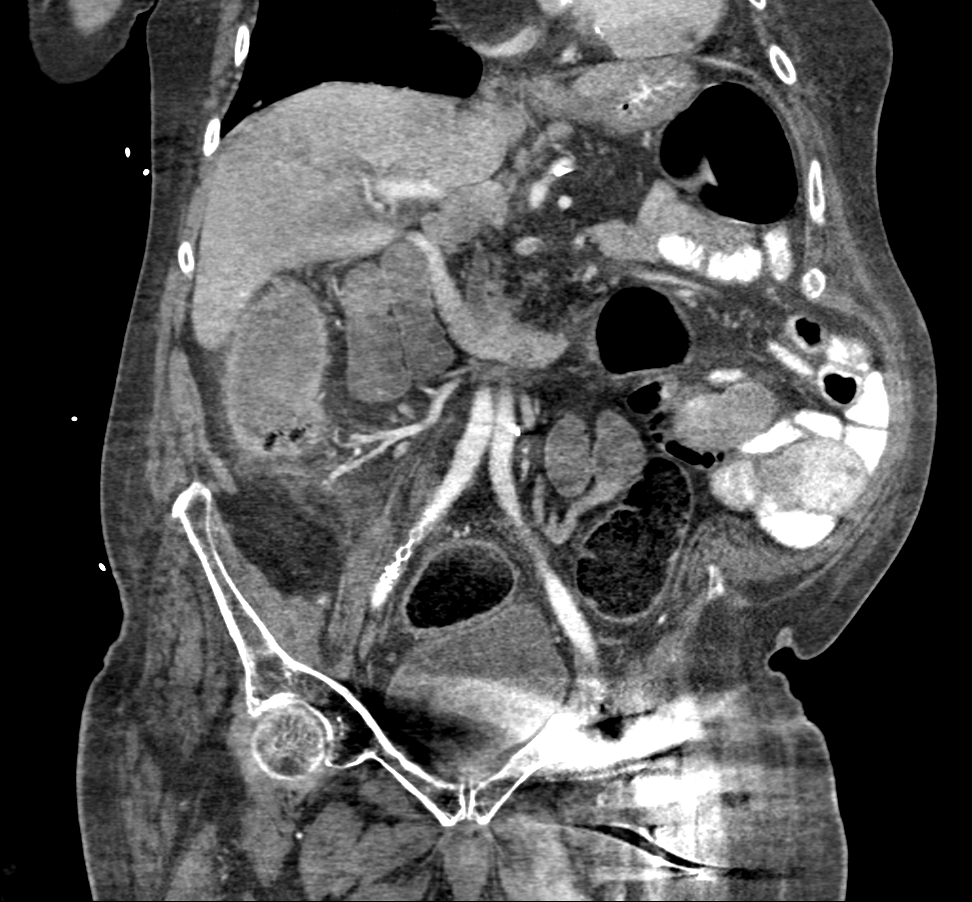

[11 of 46 positions shown; findings below may reference images not displayed]

FINDINGS: Irregular peribronchovascular markings at both lung bases have not
significantly changed. Stable cardiomegaly. No pericardial or
pleural effusion. Small gas containing hiatal hernia.

Osteopenia. Widespread mild spinal compression fractures. Moderate
to severe chronic fractures status post augmentation at L4 and L5.
Superimposed degenerative osseous changes and chronic left hip
arthroplasty. No acute osseous abnormality identified.

Streak artifact through the pelvis from the left hip hardware. Gas
and stool in the rectum appearing similar to the study in [REDACTED]. Mild
widespread large bowel wall thickening and diffusely distended large
bowel loops are re - identified with a severely redundant colon.
Mostly the colonic distention is related to gas. Occasionally there
is low-density stool and fluid within the colon. The distension
continues to the cecum. The appendix is stable without definite
surrounding inflammation. Terminal ileum is unremarkable. As before
no small bowel loops are dilated. The colonic distension has not
significantly changed (right colon up to 9 cm diameter).

Oral contrast has not yet reached the mid or distal small bowel. The
stomach is largely decompressed. The duodenum is decompressed.

Stable liver, gallbladder, spleen, pancreas (fatty infiltrated),
adrenal glands, and kidneys. Portal venous system is patent.
Extensive Aortoiliac calcified atherosclerosis noted. With distal
aorta to bilateral external iliac artery bypass, it native
aortoiliac bifurcation and stented proximal iliac arteries occluded
as before. Stable patency of the major abdominal aortic branches. No
abdominal free fluid or free air. No lymphadenopathy identified.
IMPRESSION: No acute findings in the abdomen or pelvis. Continued diffuse
dilatation of the colon not significantly changed since [REDACTED] and
most suggestive of chronic colonic inertia.

## 2015-12-04 IMAGING — CR DG ABDOMEN ACUTE W/ 1V CHEST
3 series · 3 of 3 positions shown · non-contrast
Comparison: 11/10/2013

CLINICAL DATA: Abdominal distention, abdominal pain.

EXAM:
ACUTE ABDOMEN SERIES (ABDOMEN 2 VIEW & CHEST 1 VIEW)

[w abdomen decub]
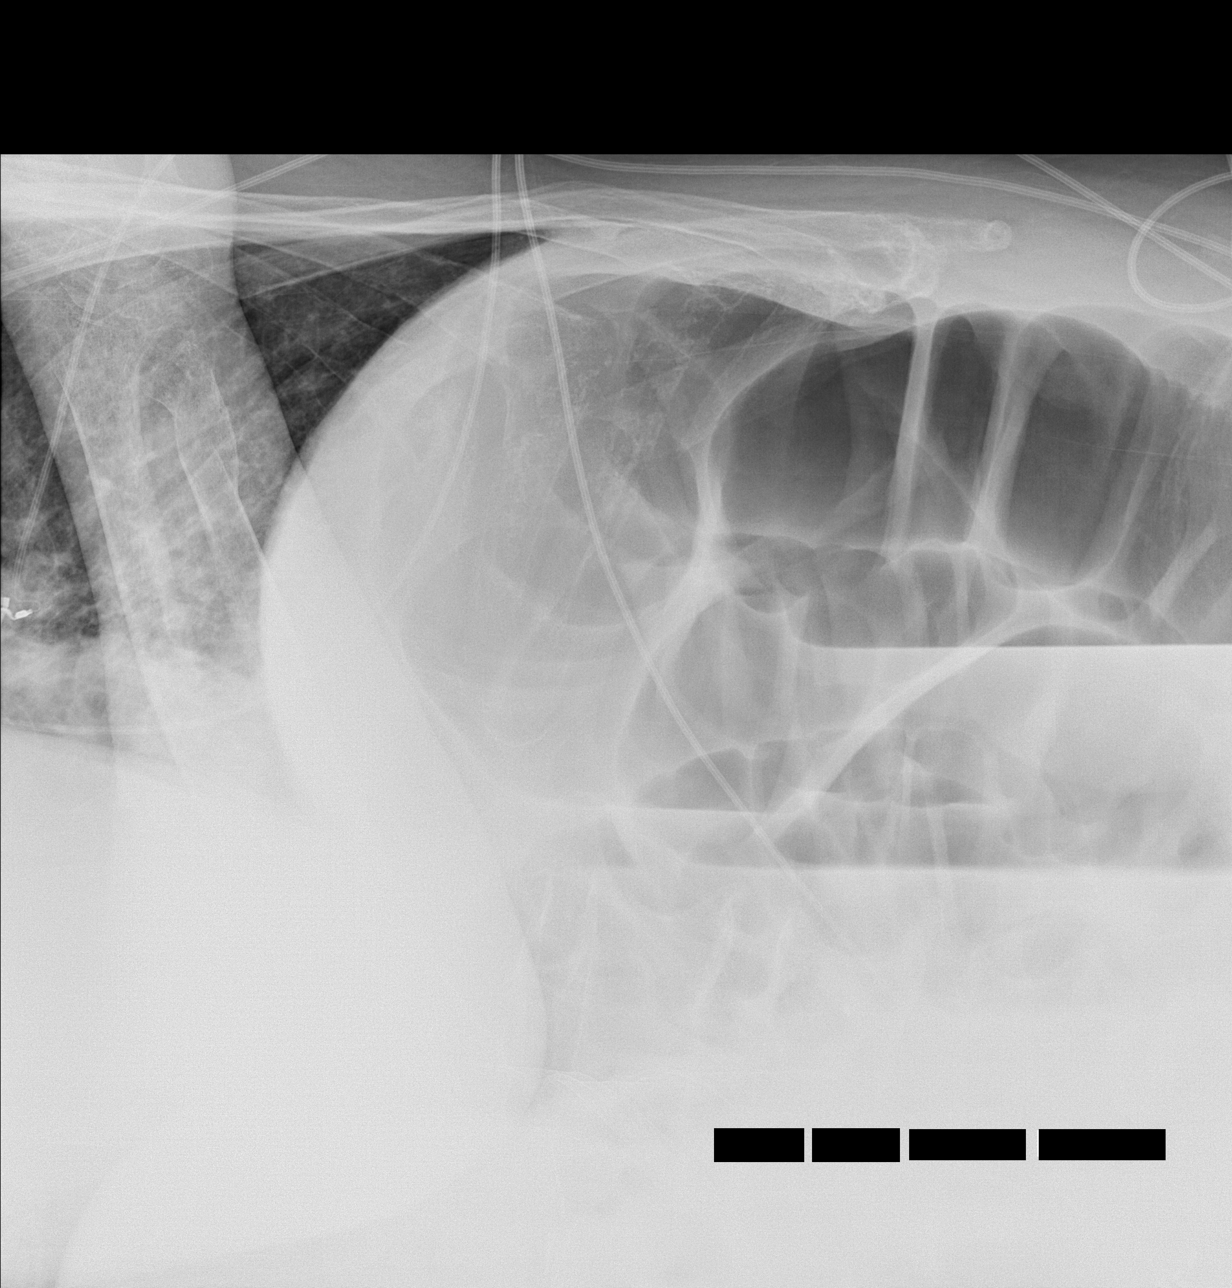

[t abdomen supine]
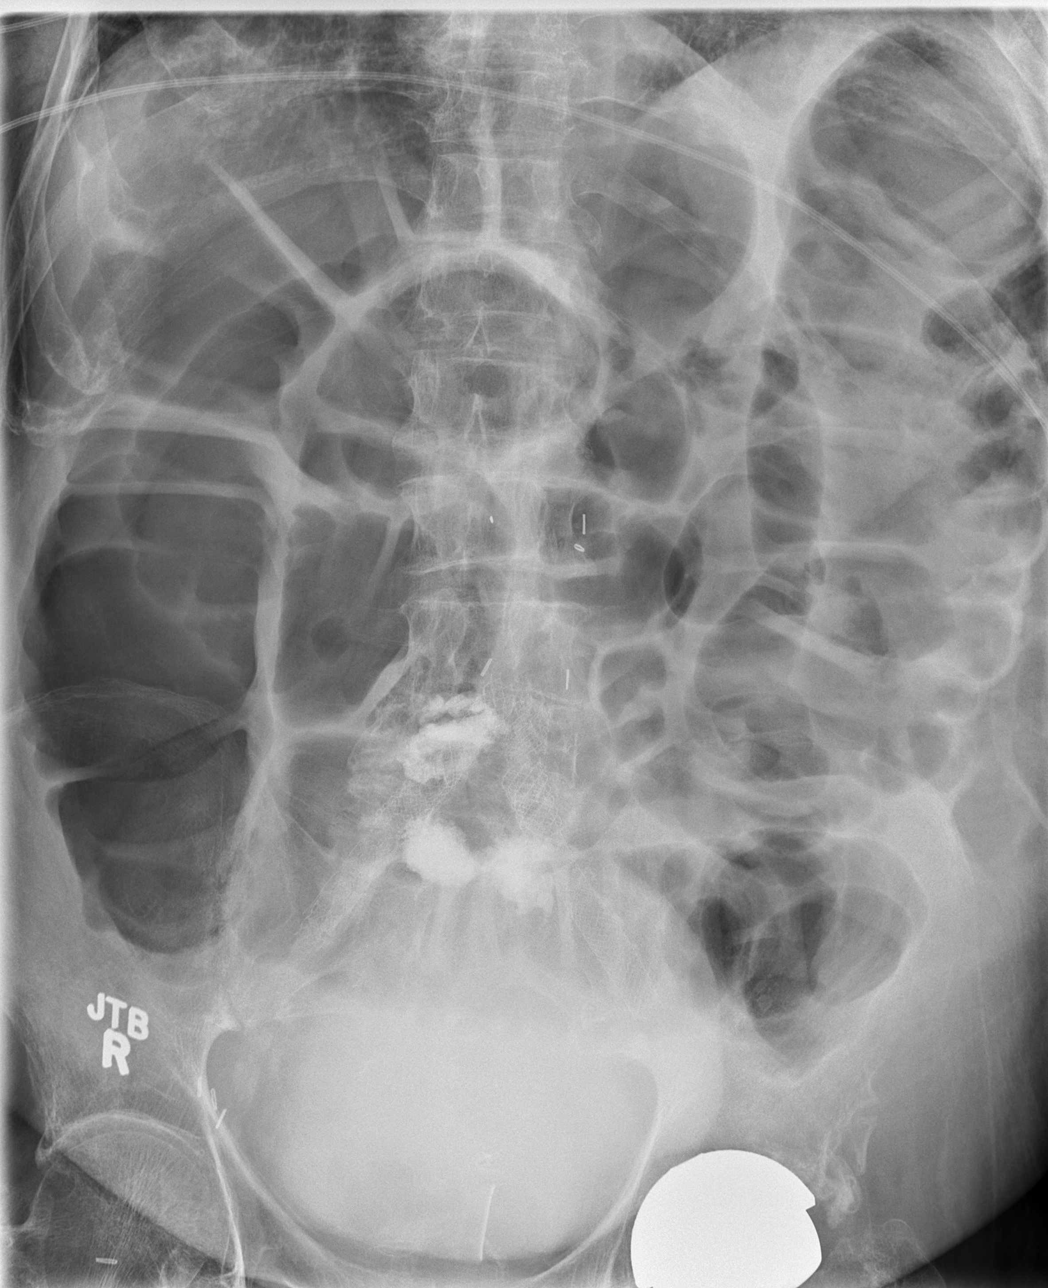

[t chest supine]
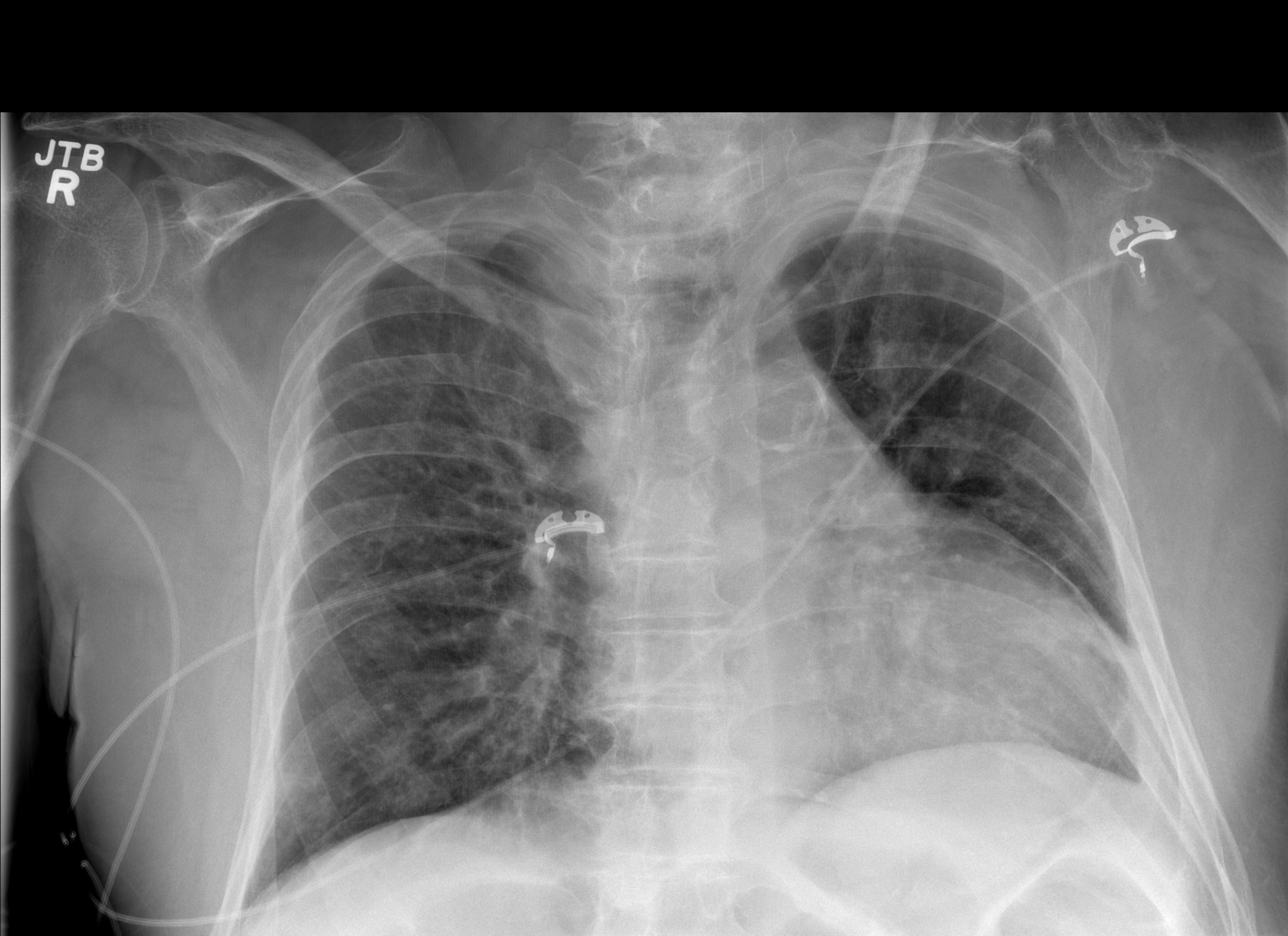

[3 of 3 positions shown; findings below may reference images not displayed]

FINDINGS: Diffuse gaseous distention of large and small bowel. No change since
prior study. No free air. No organomegaly.

Vascular stents noted in the region of the iliac vessels. Prior
vertebral augmentation in the lower lumbar and upper sacral spine.

Heart is borderline in size. No confluent airspace opacities or
effusions.
IMPRESSION: Stable moderate to severe diffuse gaseous distention of bowel
compatible with chronic ileus. No change.

No acute cardiopulmonary disease.

## 2015-12-05 IMAGING — CR DG ABDOMEN 2V
3 series · 3 of 3 positions shown · non-contrast
Comparison: 01/20/2014

CLINICAL DATA: Abdominal distention.

EXAM:
ABDOMEN - 2 VIEW

[w abdomen decub]
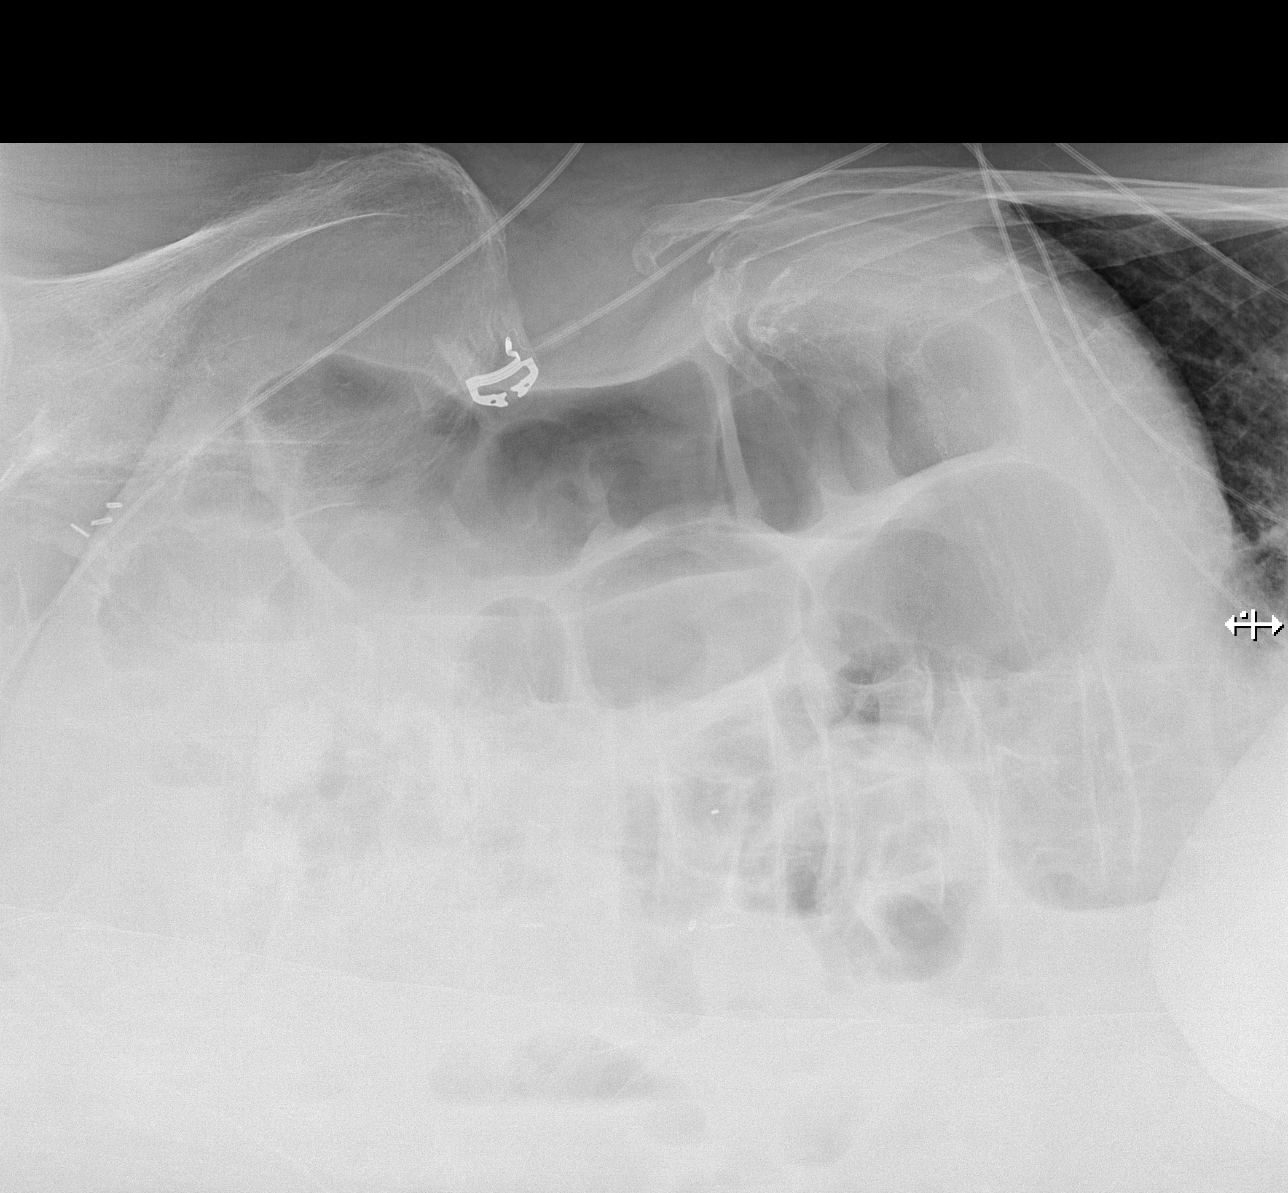

[x abdomen supine (1 of 2)]
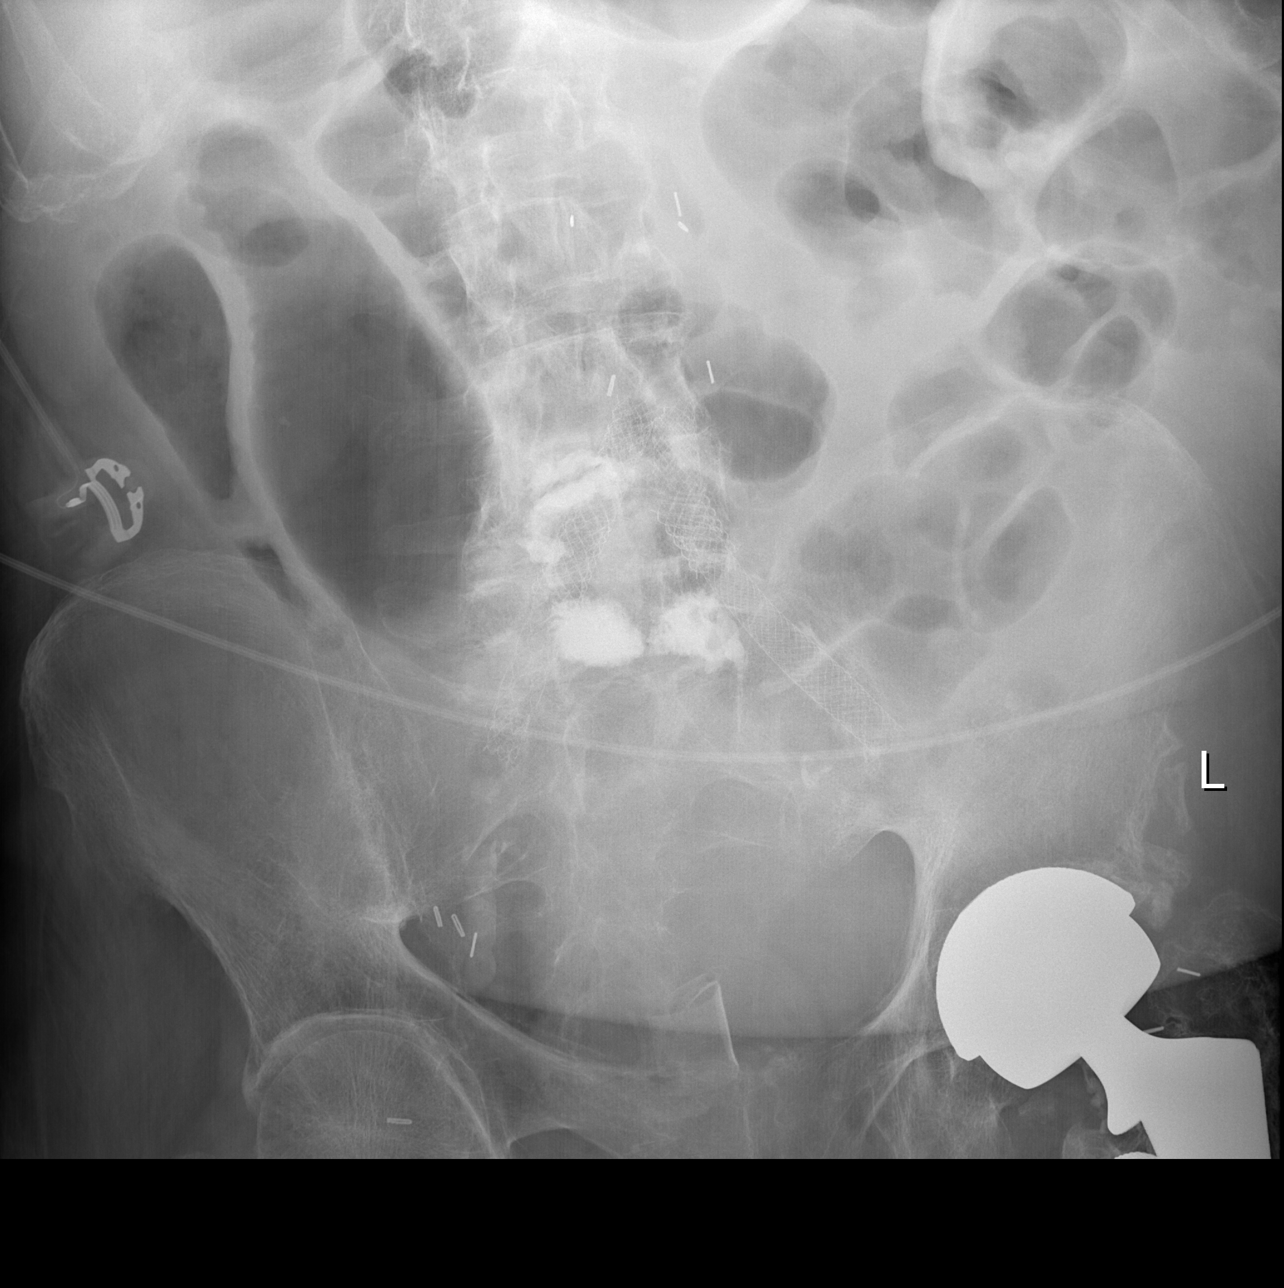

[x abdomen supine (2 of 2)]
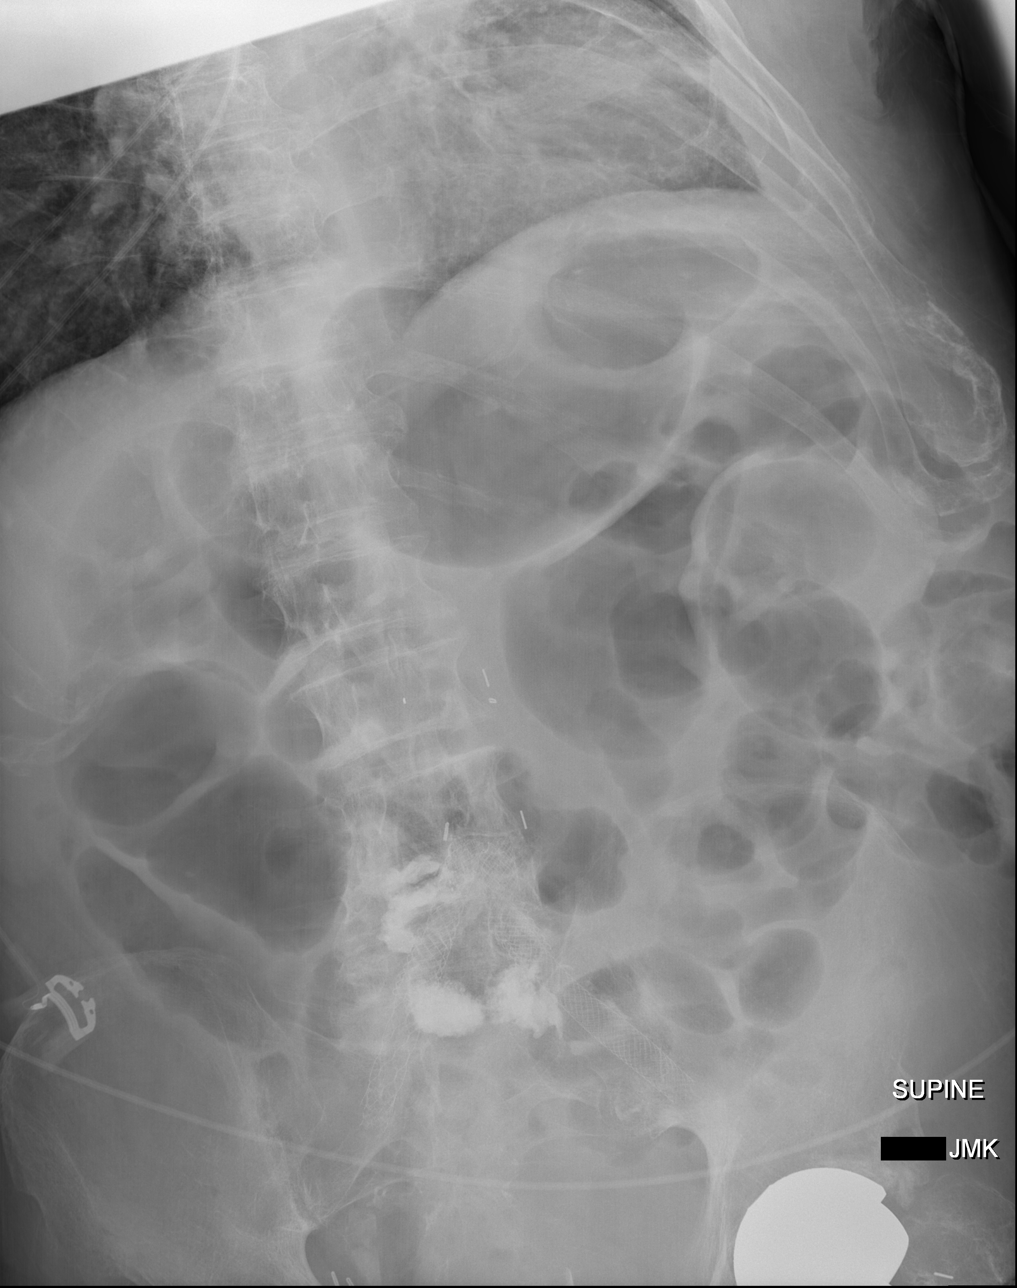

[3 of 3 positions shown; findings below may reference images not displayed]

FINDINGS: Gaseous distension of the large and small bowel loops are
identified. When compared with the previous exam the degree of bowel
distention is stable to slightly improved in the interval. No free
intraperitoneal air identified.
IMPRESSION: Stable to improved appearance of bowel distention.

## 2015-12-09 DIAGNOSIS — M25552 Pain in left hip: Secondary | ICD-10-CM | POA: Diagnosis not present

## 2015-12-21 DIAGNOSIS — L03116 Cellulitis of left lower limb: Secondary | ICD-10-CM | POA: Diagnosis not present

## 2015-12-28 DIAGNOSIS — L03116 Cellulitis of left lower limb: Secondary | ICD-10-CM | POA: Diagnosis not present

## 2015-12-28 DIAGNOSIS — L97529 Non-pressure chronic ulcer of other part of left foot with unspecified severity: Secondary | ICD-10-CM | POA: Diagnosis not present

## 2016-01-05 DIAGNOSIS — L03116 Cellulitis of left lower limb: Secondary | ICD-10-CM | POA: Diagnosis not present

## 2016-01-05 DIAGNOSIS — L139 Bullous disorder, unspecified: Secondary | ICD-10-CM | POA: Diagnosis not present

## 2016-01-05 DIAGNOSIS — Z139 Encounter for screening, unspecified: Secondary | ICD-10-CM | POA: Diagnosis not present

## 2016-01-06 DIAGNOSIS — Z96642 Presence of left artificial hip joint: Secondary | ICD-10-CM | POA: Diagnosis not present

## 2016-01-06 DIAGNOSIS — Z87891 Personal history of nicotine dependence: Secondary | ICD-10-CM | POA: Diagnosis not present

## 2016-01-06 DIAGNOSIS — L03116 Cellulitis of left lower limb: Secondary | ICD-10-CM | POA: Diagnosis not present

## 2016-01-06 DIAGNOSIS — M79605 Pain in left leg: Secondary | ICD-10-CM | POA: Diagnosis not present

## 2016-01-06 DIAGNOSIS — B9689 Other specified bacterial agents as the cause of diseases classified elsewhere: Secondary | ICD-10-CM | POA: Diagnosis not present

## 2016-01-12 DIAGNOSIS — M25552 Pain in left hip: Secondary | ICD-10-CM | POA: Diagnosis not present

## 2016-01-12 DIAGNOSIS — L0390 Cellulitis, unspecified: Secondary | ICD-10-CM | POA: Diagnosis not present

## 2016-01-12 DIAGNOSIS — I69952 Hemiplegia and hemiparesis following unspecified cerebrovascular disease affecting left dominant side: Secondary | ICD-10-CM | POA: Diagnosis not present

## 2016-01-12 DIAGNOSIS — Z9001 Acquired absence of eye: Secondary | ICD-10-CM | POA: Diagnosis not present

## 2016-01-12 DIAGNOSIS — R609 Edema, unspecified: Secondary | ICD-10-CM | POA: Diagnosis not present

## 2016-01-18 DIAGNOSIS — M79605 Pain in left leg: Secondary | ICD-10-CM | POA: Diagnosis not present

## 2016-01-19 DIAGNOSIS — M79605 Pain in left leg: Secondary | ICD-10-CM | POA: Diagnosis not present

## 2016-01-26 DIAGNOSIS — L0390 Cellulitis, unspecified: Secondary | ICD-10-CM | POA: Diagnosis not present

## 2016-01-26 DIAGNOSIS — R609 Edema, unspecified: Secondary | ICD-10-CM | POA: Diagnosis not present

## 2016-02-10 ENCOUNTER — Encounter: Payer: Self-pay | Admitting: Cardiovascular Disease

## 2016-02-10 ENCOUNTER — Ambulatory Visit (INDEPENDENT_AMBULATORY_CARE_PROVIDER_SITE_OTHER): Payer: Medicare Other | Admitting: Cardiovascular Disease

## 2016-02-10 DIAGNOSIS — I739 Peripheral vascular disease, unspecified: Secondary | ICD-10-CM

## 2016-02-10 DIAGNOSIS — I480 Paroxysmal atrial fibrillation: Secondary | ICD-10-CM

## 2016-02-10 DIAGNOSIS — E7800 Pure hypercholesterolemia, unspecified: Secondary | ICD-10-CM

## 2016-02-10 NOTE — Assessment & Plan Note (Signed)
History of hyperlipidemia on statin therapy followed by his PCP 

## 2016-02-10 NOTE — Assessment & Plan Note (Signed)
History of peripheral arterial disease status post bilateral iliac stenting remotely by Dr. Alanda AmassWeintraub ultimately undergoing aortobifemoral bypass grafting by Dr. Gretta Beganodd Early. He does have an occluded right popliteal artery and tibial vessels as well. He is wheelchair bound from a prior stroke and is nonambulatory.

## 2016-02-10 NOTE — Assessment & Plan Note (Signed)
History of paroxysmal atrial fibrillation in sinus rhythm today on Eliquis oral anticoagulation 

## 2016-02-10 NOTE — Progress Notes (Signed)
02/10/2016 [name]   09/08/1935  [id number]  Primary Physician Joshua RavelHAMRICK,MAURA L, MD Primary Cardiologist: Joshua GessJonathan J Dai Apel MD [name], FACC, FAHA, Snow  HPI:  [name] is a 80 year old moderately overweight married Caucasian male patient of Dr. Rocco Sereneichard Snow's who is accompanied by his wife today. I last saw him in the office 02/09/15. He has a history of peripheral vascular disease status post bilateral iliac stenting remotely by Dr. Alanda Snow and ultimately aortobifemoral bypass grafting by Dr. Gretta Beganodd Snow. He does have an occluded right popliteal and tibial vessels as well. His other problems include history of stroke January 2009. He is hemiparetic and wheelchair-bound. He has diabetes, atrial fibrillation, hyperlipidemia and hypertension. There is no history of heart disease however. Dr. Alanda Snow began him on amiodarone and Coumadin anticoagulation. He was admitted for a supratherapeutic INR of 11 and underwent transfusion. He was ultimately was changed to a novel oral anticoagulant (Eliquis ).). Since I saw him in the office one year ago he has remained cardiovascular stable.    Current Outpatient Prescriptions  Medication Sig Dispense Refill  . ACCU-CHEK SMARTVIEW test strip Use as directed  0  . amiodarone (PACERONE) 200 MG tablet TAKE 1 TABLET BY MOUTH EVERY DAY 90 tablet 3  . apixaban (ELIQUIS) 5 MG TABS tablet Take 1 tablet (5 mg total) by mouth 2 (two) times daily. 56 tablet 0  . atorvastatin (LIPITOR) 40 MG tablet Take 40 mg by mouth every morning.     . BD INSULIN SYRINGE ULTRAFINE 31G X 5/16" 0.3 ML MISC Use as directed  3  . cilostazol (PLETAL) 50 MG tablet Take 1 tablet (50 mg total) by mouth 2 (two) times daily. 60 tablet 11  . diclofenac sodium (VOLTAREN) 1 % GEL Apply 4 g topically 4 (four) times daily. 1 Tube 0  . furosemide (LASIX) 40 MG tablet Take 1 tablet by mouth 2 (two) times daily.    . insulin glargine (LANTUS) 100 UNIT/ML injection Inject  0.2-0.22 mLs (20-22 Units total) into the skin at bedtime. 10 mL 11  . KLOR-CON M20 20 MEQ tablet Take 20 mEq by mouth 2 (two) times daily.     [name]. levothyroxine (SYNTHROID, LEVOTHROID) 200 MCG tablet Take 200 mcg by mouth daily before breakfast.    . LIDOCAINE EX Apply 1 Dose topically daily as needed.    . lipase/protease/amylase (CREON-12/PANCREASE) 12000 UNITS CPEP capsule Take 3 capsules by mouth 3 (three) times daily before meals.     . metoprolol succinate (TOPROL-XL) 25 MG 24 hr tablet Take 0.5 tablets (12.5 mg total) by mouth daily. 15 tablet 5  . Multiple Vitamin (MULITIVITAMIN WITH MINERALS) TABS Take 1 tablet by mouth every morning.     . naproxen (NAPROSYN) 500 MG tablet Take 1 tablet (500 mg total) by mouth 2 (two) times daily with a meal. 30 tablet 0  . oxyCODONE-acetaminophen (PERCOCET) 10-325 MG per tablet Take 1 tablet by mouth 2 (two) times daily as needed.    . saccharomyces boulardii (FLORASTOR) 250 MG capsule Take 1 capsule (250 mg total) by mouth every morning. 60 capsule 0  . sulfamethoxazole-trimethoprim (BACTRIM DS,SEPTRA DS) 800-160 MG tablet Take 1 tablet by mouth 2 (two) times daily.    . tamsulosin (FLOMAX) 0.4 MG CAPS capsule Take 1 capsule (0.4 mg total) by mouth daily. 30 capsule 0  . venlafaxine XR (EFFEXOR-XR) 75 MG 24 hr capsule Take 75 mg by mouth daily with breakfast.     No current facility-administered medications  for this visit.     Allergies  Allergen Reactions  . Hydromorphone Hcl Other (See Comments)    "makes him crazy" per wife  . Penicillins Hives    Has patient had a PCN reaction causing immediate rash, facial/tongue/throat swelling, SOB or lightheadedness with hypotension: Yes Has patient had a PCN reaction causing severe rash involving mucus membranes or skin necrosis: No Has patient had a PCN reaction that required hospitalization No Has patient had a PCN reaction occurring within the last 10 years: No If all of the above answers are "NO",  then may proceed with Cephalosporin use.   . Melatonin Rash    Social History   Social History  . Marital status: Married    Spouse name: [name]  . Number of children: 3  . Years of education: 8   Occupational History  . Worked in a Warehouse manager    Social History Main Topics  . Smoking status: Former Smoker    Types: Cigarettes    Quit date: 05/07/1996  . Smokeless tobacco: Former Neurosurgeon    Types: Chew     Comment: Quit 20-30 years ago  . Alcohol use No  . Drug use: No  . Sexual activity: No   Other Topics Concern  . Not on file   Social History Narrative   Lives at home with wife.     Non-ambulatory since 2009.     Wife has a lift chair, wheel chair and hospital bed at home.     Review of Systems: General: negative for chills, fever, night sweats or weight changes.  Cardiovascular: negative for chest pain, dyspnea on exertion, edema, orthopnea, palpitations, paroxysmal nocturnal dyspnea or shortness of breath Dermatological: negative for rash Respiratory: negative for cough or wheezing Urologic: negative for hematuria Abdominal: negative for nausea, vomiting, diarrhea, bright red blood per rectum, melena, or hematemesis Neurologic: negative for visual changes, syncope, or dizziness All other systems reviewed and are otherwise negative except as noted above.    Blood pressure (!) 100/54, pulse 73, height 5\' 8"  (1.727 m).  General appearance: alert and no distress Neck: no adenopathy, no carotid bruit, no JVD, supple, symmetrical, trachea midline and thyroid not enlarged, symmetric, no tenderness/mass/nodules Lungs: clear to auscultation bilaterally Heart: regular rate and rhythm, S1, S2 normal, no murmur, click, rub or gallop Extremities: extremities normal, atraumatic, no cyanosis or edema  EKG normal sinus rhythm at 73 with nonspecific IVCD and poor R-wave progression. I personally reviewed this EKG  ASSESSMENT AND PLAN:   Atrial fibrillation (HCC) History  of paroxysmal atrial fibrillation in sinus rhythm today on Eliquis oral anticoagulation.  Hypercholesterolemia History of hyperlipidemia on statin therapy followed by his PCP  Peripheral arterial disease (HCC) History of peripheral arterial disease status post bilateral iliac stenting remotely by Dr. [name] ultimately undergoing aortobifemoral bypass grafting by Dr. [name]. He does have an occluded right popliteal artery and tibial vessels as well. He is wheelchair bound from a prior stroke and is nonambulatory.      Joshua Gess MD JoshuaFACC,FAHA, [facility] 02/10/2016 10:18 AM

## 2016-02-10 NOTE — Patient Instructions (Signed)
Medication Instructions:  Continue current medications.   Follow-Up: Your physician wants you to follow-up in: 12 MONTHS WITH DR BERRY.  You will receive a reminder letter in the mail two months in advance. If you don't receive a letter, please call our office to schedule the follow-up appointment.   If you need a refill on your cardiac medications before your next appointment, please call your pharmacy.   

## 2016-02-16 DIAGNOSIS — M545 Low back pain: Secondary | ICD-10-CM | POA: Diagnosis not present

## 2016-02-16 DIAGNOSIS — L0390 Cellulitis, unspecified: Secondary | ICD-10-CM | POA: Diagnosis not present

## 2016-02-16 DIAGNOSIS — R51 Headache: Secondary | ICD-10-CM | POA: Diagnosis not present

## 2016-02-16 DIAGNOSIS — E1165 Type 2 diabetes mellitus with hyperglycemia: Secondary | ICD-10-CM | POA: Diagnosis not present

## 2016-02-16 DIAGNOSIS — R609 Edema, unspecified: Secondary | ICD-10-CM | POA: Diagnosis not present

## 2016-02-17 DIAGNOSIS — I639 Cerebral infarction, unspecified: Secondary | ICD-10-CM | POA: Diagnosis not present

## 2016-02-17 DIAGNOSIS — R1310 Dysphagia, unspecified: Secondary | ICD-10-CM | POA: Diagnosis not present

## 2016-02-17 DIAGNOSIS — G8929 Other chronic pain: Secondary | ICD-10-CM | POA: Diagnosis not present

## 2016-02-17 DIAGNOSIS — M25552 Pain in left hip: Secondary | ICD-10-CM | POA: Diagnosis not present

## 2016-02-17 DIAGNOSIS — I6789 Other cerebrovascular disease: Secondary | ICD-10-CM | POA: Diagnosis not present

## 2016-02-17 DIAGNOSIS — J449 Chronic obstructive pulmonary disease, unspecified: Secondary | ICD-10-CM | POA: Diagnosis not present

## 2016-02-17 DIAGNOSIS — Z96642 Presence of left artificial hip joint: Secondary | ICD-10-CM | POA: Diagnosis not present

## 2016-02-17 DIAGNOSIS — Z471 Aftercare following joint replacement surgery: Secondary | ICD-10-CM | POA: Diagnosis not present

## 2016-02-19 ENCOUNTER — Encounter (HOSPITAL_COMMUNITY): Payer: Self-pay

## 2016-02-19 ENCOUNTER — Observation Stay (HOSPITAL_COMMUNITY)
Admission: EM | Admit: 2016-02-19 | Discharge: 2016-02-20 | Disposition: A | Discharge status: Home / Self Care | Payer: Medicare Other | Attending: Internal Medicine | Admitting: Internal Medicine

## 2016-02-19 ENCOUNTER — Emergency Department (HOSPITAL_COMMUNITY): Payer: Medicare Other

## 2016-02-19 DIAGNOSIS — Z87891 Personal history of nicotine dependence: Secondary | ICD-10-CM | POA: Insufficient documentation

## 2016-02-19 DIAGNOSIS — M79605 Pain in left leg: Secondary | ICD-10-CM | POA: Diagnosis not present

## 2016-02-19 DIAGNOSIS — I10 Essential (primary) hypertension: Secondary | ICD-10-CM | POA: Insufficient documentation

## 2016-02-19 DIAGNOSIS — E039 Hypothyroidism, unspecified: Secondary | ICD-10-CM | POA: Diagnosis not present

## 2016-02-19 DIAGNOSIS — E119 Type 2 diabetes mellitus without complications: Secondary | ICD-10-CM | POA: Insufficient documentation

## 2016-02-19 DIAGNOSIS — W19XXXA Unspecified fall, initial encounter: Secondary | ICD-10-CM

## 2016-02-19 DIAGNOSIS — W050XXA Fall from non-moving wheelchair, initial encounter: Secondary | ICD-10-CM | POA: Insufficient documentation

## 2016-02-19 DIAGNOSIS — Z8673 Personal history of transient ischemic attack (TIA), and cerebral infarction without residual deficits: Secondary | ICD-10-CM | POA: Insufficient documentation

## 2016-02-19 DIAGNOSIS — T148XXA Other injury of unspecified body region, initial encounter: Secondary | ICD-10-CM | POA: Diagnosis not present

## 2016-02-19 DIAGNOSIS — Z794 Long term (current) use of insulin: Secondary | ICD-10-CM | POA: Insufficient documentation

## 2016-02-19 DIAGNOSIS — S299XXA Unspecified injury of thorax, initial encounter: Secondary | ICD-10-CM | POA: Diagnosis not present

## 2016-02-19 DIAGNOSIS — R410 Disorientation, unspecified: Secondary | ICD-10-CM | POA: Diagnosis not present

## 2016-02-19 DIAGNOSIS — L089 Local infection of the skin and subcutaneous tissue, unspecified: Secondary | ICD-10-CM | POA: Diagnosis present

## 2016-02-19 DIAGNOSIS — Z96653 Presence of artificial knee joint, bilateral: Secondary | ICD-10-CM | POA: Insufficient documentation

## 2016-02-19 DIAGNOSIS — R296 Repeated falls: Secondary | ICD-10-CM | POA: Diagnosis not present

## 2016-02-19 DIAGNOSIS — Y999 Unspecified external cause status: Secondary | ICD-10-CM | POA: Diagnosis not present

## 2016-02-19 DIAGNOSIS — Z96642 Presence of left artificial hip joint: Secondary | ICD-10-CM | POA: Insufficient documentation

## 2016-02-19 DIAGNOSIS — D72829 Elevated white blood cell count, unspecified: Secondary | ICD-10-CM | POA: Diagnosis not present

## 2016-02-19 DIAGNOSIS — S79912A Unspecified injury of left hip, initial encounter: Secondary | ICD-10-CM | POA: Diagnosis not present

## 2016-02-19 DIAGNOSIS — Y92009 Unspecified place in unspecified non-institutional (private) residence as the place of occurrence of the external cause: Secondary | ICD-10-CM | POA: Insufficient documentation

## 2016-02-19 DIAGNOSIS — Z79899 Other long term (current) drug therapy: Secondary | ICD-10-CM | POA: Diagnosis not present

## 2016-02-19 DIAGNOSIS — Y9389 Activity, other specified: Secondary | ICD-10-CM | POA: Diagnosis not present

## 2016-02-19 DIAGNOSIS — S8992XA Unspecified injury of left lower leg, initial encounter: Secondary | ICD-10-CM | POA: Diagnosis not present

## 2016-02-19 DIAGNOSIS — M7989 Other specified soft tissue disorders: Secondary | ICD-10-CM | POA: Diagnosis not present

## 2016-02-19 LAB — CBC WITH DIFFERENTIAL/PLATELET
BASOS ABS: 0 10*3/uL (ref 0.0–0.1)
BASOS PCT: 0 %
EOS ABS: 0 10*3/uL (ref 0.0–0.7)
EOS PCT: 0 %
HEMATOCRIT: 39.9 % (ref 39.0–52.0)
Hemoglobin: 12.8 g/dL — ABNORMAL LOW (ref 13.0–17.0)
Lymphocytes Relative: 12 %
Lymphs Abs: 1.7 10*3/uL (ref 0.7–4.0)
MCH: 29.2 pg (ref 26.0–34.0)
MCHC: 32.1 g/dL (ref 30.0–36.0)
MCV: 90.9 fL (ref 78.0–100.0)
MONO ABS: 1.1 10*3/uL — AB (ref 0.1–1.0)
Monocytes Relative: 8 %
NEUTROS ABS: 11 10*3/uL — AB (ref 1.7–7.7)
Neutrophils Relative %: 80 %
PLATELETS: 231 10*3/uL (ref 150–400)
RBC: 4.39 MIL/uL (ref 4.22–5.81)
RDW: 17.2 % — AB (ref 11.5–15.5)
WBC: 13.8 10*3/uL — ABNORMAL HIGH (ref 4.0–10.5)

## 2016-02-19 LAB — LACTIC ACID, PLASMA
LACTIC ACID, VENOUS: 2.6 mmol/L — AB (ref 0.5–1.9)
LACTIC ACID, VENOUS: 1.8 mmol/L (ref 0.5–1.9)

## 2016-02-19 LAB — COMPREHENSIVE METABOLIC PANEL
ALK PHOS: 119 U/L (ref 38–126)
ALT: 34 U/L (ref 17–63)
AST: 43 U/L — AB (ref 15–41)
Albumin: 3.4 g/dL — ABNORMAL LOW (ref 3.5–5.0)
Anion gap: 9 (ref 5–15)
BILIRUBIN TOTAL: 0.7 mg/dL (ref 0.3–1.2)
BUN: 18 mg/dL (ref 6–20)
CALCIUM: 9 mg/dL (ref 8.9–10.3)
CO2: 28 mmol/L (ref 22–32)
Chloride: 99 mmol/L — ABNORMAL LOW (ref 101–111)
Creatinine, Ser: 1.18 mg/dL (ref 0.61–1.24)
GFR calc Af Amer: >60 mL/min (ref >60)
GFR, EST NON AFRICAN AMERICAN: 57 mL/min — AB (ref >60)
Glucose, Bld: 147 mg/dL — ABNORMAL HIGH (ref 65–99)
POTASSIUM: 3.7 mmol/L (ref 3.5–5.1)
Sodium: 136 mmol/L (ref 135–145)
TOTAL PROTEIN: 6.7 g/dL (ref 6.5–8.1)

## 2016-02-19 LAB — GLUCOSE, CAPILLARY
Glucose-Capillary: 80 mg/dL (ref 65–99)
Glucose-Capillary: 90 mg/dL (ref 65–99)

## 2016-02-19 LAB — LIPASE, BLOOD: LIPASE: 19 U/L (ref 11–51)

## 2016-02-19 LAB — TROPONIN I

## 2016-02-19 MED ORDER — DICLOFENAC SODIUM 1 % TD GEL
4.0000 g | Freq: Four times a day (QID) | TRANSDERMAL | Status: DC
  Administered 2016-02-19 – 2016-02-20 (×3): 4 g via TOPICAL
  Filled 2016-02-19: qty 100

## 2016-02-19 MED ORDER — VANCOMYCIN HCL 10 G IV SOLR: 1250.0000 mg | INTRAVENOUS | Status: DC

## 2016-02-19 MED ORDER — SODIUM CHLORIDE 0.9% FLUSH
3.0000 mL | Freq: Two times a day (BID) | INTRAVENOUS | Status: DC
  Administered 2016-02-19: 3 mL via INTRAVENOUS

## 2016-02-19 MED ORDER — VENLAFAXINE HCL ER 75 MG PO CP24
75.0000 mg | ORAL_CAPSULE | Freq: Every day | ORAL | Status: DC
Start: 2016-02-20 — End: 2016-02-20
  Administered 2016-02-20: 75 mg via ORAL
  Filled 2016-02-19 (×2): qty 1

## 2016-02-19 MED ORDER — INSULIN ASPART 100 UNIT/ML ~~LOC~~ SOLN
0.0000 [IU] | Freq: Three times a day (TID) | SUBCUTANEOUS | Status: DC
Start: 2016-02-20 — End: 2016-02-20
  Administered 2016-02-20: 1 [IU] via SUBCUTANEOUS

## 2016-02-19 MED ORDER — METRONIDAZOLE IN NACL 5-0.79 MG/ML-% IV SOLN: 500.0000 mg | Freq: Three times a day (TID) | INTRAVENOUS | Status: DC

## 2016-02-19 MED ORDER — SODIUM CHLORIDE 0.9 % IV SOLN
INTRAVENOUS | Status: AC
  Administered 2016-02-19: 18:00:00 via INTRAVENOUS

## 2016-02-19 MED ORDER — SULFAMETHOXAZOLE-TRIMETHOPRIM 800-160 MG PO TABS
1.0000 | ORAL_TABLET | Freq: Two times a day (BID) | ORAL | Status: DC
  Administered 2016-02-19 – 2016-02-20 (×2): 1 via ORAL
  Filled 2016-02-19 (×2): qty 1

## 2016-02-19 MED ORDER — ATORVASTATIN CALCIUM 40 MG PO TABS
40.0000 mg | ORAL_TABLET | Freq: Every morning | ORAL | Status: DC
  Administered 2016-02-20: 40 mg via ORAL
  Filled 2016-02-19: qty 1

## 2016-02-19 MED ORDER — METRONIDAZOLE IN NACL 5-0.79 MG/ML-% IV SOLN
500.0000 mg | Freq: Once | INTRAVENOUS | Status: AC
  Administered 2016-02-19: 500 mg via INTRAVENOUS
  Filled 2016-02-19: qty 100

## 2016-02-19 MED ORDER — FENTANYL CITRATE (PF) 100 MCG/2ML IJ SOLN
25.0000 ug | Freq: Four times a day (QID) | INTRAMUSCULAR | Status: DC | PRN
  Administered 2016-02-20 (×2): 25 ug via INTRAVENOUS
  Filled 2016-02-19 (×2): qty 2

## 2016-02-19 MED ORDER — OXYCODONE-ACETAMINOPHEN 10-325 MG PO TABS: 1.0000 | ORAL_TABLET | ORAL | Status: DC | PRN

## 2016-02-19 MED ORDER — INSULIN GLARGINE 100 UNIT/ML ~~LOC~~ SOLN
12.0000 [IU] | Freq: Every day | SUBCUTANEOUS | Status: DC
  Administered 2016-02-19: 12 [IU] via SUBCUTANEOUS
  Filled 2016-02-19 (×2): qty 0.12

## 2016-02-19 MED ORDER — AMIODARONE HCL 200 MG PO TABS
200.0000 mg | ORAL_TABLET | Freq: Every day | ORAL | Status: DC
  Administered 2016-02-19 – 2016-02-20 (×2): 200 mg via ORAL
  Filled 2016-02-19 (×2): qty 1

## 2016-02-19 MED ORDER — METOPROLOL SUCCINATE ER 25 MG PO TB24
12.5000 mg | ORAL_TABLET | Freq: Every day | ORAL | Status: DC
  Administered 2016-02-19 – 2016-02-20 (×2): 12.5 mg via ORAL
  Filled 2016-02-19 (×2): qty 1

## 2016-02-19 MED ORDER — APIXABAN 5 MG PO TABS
5.0000 mg | ORAL_TABLET | Freq: Two times a day (BID) | ORAL | Status: DC
Start: 2016-02-19 — End: 2016-02-20
  Administered 2016-02-19 – 2016-02-20 (×2): 5 mg via ORAL
  Filled 2016-02-19 (×2): qty 1

## 2016-02-19 MED ORDER — INFLUENZA VAC SPLIT QUAD 0.5 ML IM SUSY
0.5000 mL | PREFILLED_SYRINGE | INTRAMUSCULAR | Status: DC
  Filled 2016-02-19: qty 0.5

## 2016-02-19 MED ORDER — POTASSIUM CHLORIDE CRYS ER 20 MEQ PO TBCR
20.0000 meq | EXTENDED_RELEASE_TABLET | Freq: Two times a day (BID) | ORAL | Status: DC
  Administered 2016-02-19 – 2016-02-20 (×2): 20 meq via ORAL
  Filled 2016-02-19 (×2): qty 1

## 2016-02-19 MED ORDER — VANCOMYCIN HCL IN DEXTROSE 1-5 GM/200ML-% IV SOLN
1000.0000 mg | Freq: Once | INTRAVENOUS | Status: DC
  Administered 2016-02-19: 1000 mg via INTRAVENOUS

## 2016-02-19 MED ORDER — PANCRELIPASE (LIP-PROT-AMYL) 36000-114000 UNITS PO CPEP
36000.0000 [IU] | ORAL_CAPSULE | Freq: Three times a day (TID) | ORAL | Status: DC
  Administered 2016-02-20 (×3): 36000 [IU] via ORAL
  Filled 2016-02-19 (×3): qty 1

## 2016-02-19 MED ORDER — SODIUM CHLORIDE 0.9 % IV SOLN
2000.0000 mg | Freq: Once | INTRAVENOUS | Status: DC
  Administered 2016-02-19: 2000 mg via INTRAVENOUS
  Filled 2016-02-19: qty 2000

## 2016-02-19 MED ORDER — OXYCODONE HCL 5 MG PO TABS
5.0000 mg | ORAL_TABLET | ORAL | Status: DC | PRN
  Administered 2016-02-19 – 2016-02-20 (×3): 5 mg via ORAL
  Filled 2016-02-19 (×4): qty 1

## 2016-02-19 MED ORDER — DEXTROSE 5 % IV SOLN
2.0000 g | Freq: Once | INTRAVENOUS | Status: AC
  Administered 2016-02-19: 2 g via INTRAVENOUS
  Filled 2016-02-19: qty 2

## 2016-02-19 MED ORDER — FENTANYL CITRATE (PF) 100 MCG/2ML IJ SOLN
INTRAMUSCULAR | Status: AC
  Filled 2016-02-19: qty 2

## 2016-02-19 MED ORDER — FENTANYL CITRATE (PF) 100 MCG/2ML IJ SOLN
50.0000 ug | Freq: Once | INTRAMUSCULAR | Status: AC
  Administered 2016-02-19: 50 ug via INTRAVENOUS
  Filled 2016-02-19: qty 2

## 2016-02-19 MED ORDER — OXYCODONE-ACETAMINOPHEN 5-325 MG PO TABS
1.0000 | ORAL_TABLET | ORAL | Status: DC | PRN
  Administered 2016-02-19 – 2016-02-20 (×3): 1 via ORAL
  Filled 2016-02-19 (×4): qty 1

## 2016-02-19 MED ORDER — DEXTROSE 5 % IV SOLN: 2.0000 g | Freq: Three times a day (TID) | INTRAVENOUS | Status: DC

## 2016-02-19 MED ORDER — ACETAMINOPHEN 650 MG RE SUPP: 650.0000 mg | Freq: Four times a day (QID) | RECTAL | Status: DC | PRN

## 2016-02-19 MED ORDER — ACETAMINOPHEN 325 MG PO TABS: 650.0000 mg | ORAL_TABLET | Freq: Four times a day (QID) | ORAL | Status: DC | PRN

## 2016-02-19 MED ORDER — CILOSTAZOL 100 MG PO TABS
50.0000 mg | ORAL_TABLET | Freq: Two times a day (BID) | ORAL | Status: DC
Start: 2016-02-19 — End: 2016-02-20
  Administered 2016-02-19 – 2016-02-20 (×2): 50 mg via ORAL
  Filled 2016-02-19 (×2): qty 1

## 2016-02-19 MED ORDER — LEVOTHYROXINE SODIUM 100 MCG PO TABS
200.0000 ug | ORAL_TABLET | Freq: Every day | ORAL | Status: DC
  Administered 2016-02-20: 200 ug via ORAL
  Filled 2016-02-19: qty 2

## 2016-02-19 MED ORDER — ADULT MULTIVITAMIN W/MINERALS CH
1.0000 | ORAL_TABLET | Freq: Every morning | ORAL | Status: DC
  Administered 2016-02-20: 1 via ORAL
  Filled 2016-02-19: qty 1

## 2016-02-19 MED ORDER — TAMSULOSIN HCL 0.4 MG PO CAPS
0.4000 mg | ORAL_CAPSULE | Freq: Every day | ORAL | Status: DC
  Administered 2016-02-19 – 2016-02-20 (×2): 0.4 mg via ORAL
  Filled 2016-02-19 (×2): qty 1

## 2016-02-19 MED ORDER — SODIUM CHLORIDE 0.9 % IV BOLUS (SEPSIS)
500.0000 mL | Freq: Once | INTRAVENOUS | Status: AC
  Administered 2016-02-19: 500 mL via INTRAVENOUS

## 2016-02-19 NOTE — Progress Notes (Signed)
Patient arrived from Emergency via stretcher,i was surprised,when the transported told this nurse that they found two bed bugs from the patient and he needs to placed on isolation,this was never reported to me by the E.D. Nurse.Called back to E.D. Nurse for clarification,indeed, they found two bed bug before the patient was transferred up here in the unit.As per E.D. nurse they already did the bed bug protocol to him around ten o clock in the morning and found two bed bugs around three thirty in the afternoon.This writer placed the patient back on isolation,since we do not know where the bed bugs came from,either from patient head or from other sources like his bilateral ulna booth dressing.On his right anterior right  leg above the ulna dressing, a skin tear  6.5 x 4.5 x.1 is noted, dressed as per protocol.On his left leg, there is a newly healing wound which I dressed with a pink foam.Left foot 5th toe is missing and on the right foot,the 3rd and 5th toes are missing too.Both lower extremities are swelled including the left hand.Patient BLE is slightly contracted therefore have limited movement. Left upper extremity have no movement on the arm level due to history of stroke.Patient has a good swallowing.

## 2016-02-19 NOTE — ED Notes (Signed)
Nurse starting IV 

## 2016-02-19 NOTE — Progress Notes (Addendum)
Pharmacy Antibiotic Note  [name] is a 80 y.o. male admitted on 02/19/2016 with cellulitis. Code sepsis called.  Pharmacy has been consulted for Vancomycin/Aztreonam/Metronidazole dosing.  Plan: Aztreonam IV 2g q8h Metronidazole IV 500mg  q8h Vancomycin IV 2000mg  x 1, then 1250mg  q24h F/u renal function, cultures, vanc levels at steady state Antibiotics delivered to nurse for sepsis call  Height: 5\' 8"  (172.7 cm) Weight: 226 lb (102.5 kg) IBW/kg (Calculated) : 68.4  Temp (24hrs), Avg:98.1 F (36.7 C), Min:98.1 F (36.7 C), Max:98.1 F (36.7 C)   Recent Labs Lab 02/19/16 1050  WBC 13.8*  CREATININE 1.18  LATICACIDVEN 2.6*    Estimated Creatinine Clearance: 59.8 mL/min (by C-G formula based on SCr of 1.18 mg/dL).    Allergies  Allergen Reactions  . Hydromorphone Hcl Other (See Comments)    "makes him crazy" per wife  . Penicillins Hives    Has patient had a PCN reaction causing immediate rash, facial/tongue/throat swelling, SOB or lightheadedness with hypotension: Yes Has patient had a PCN reaction causing severe rash involving mucus membranes or skin necrosis: No Has patient had a PCN reaction that required hospitalization No Has patient had a PCN reaction occurring within the last 10 years: No If all of the above answers are "NO", then may proceed with Cephalosporin use.   . Melatonin Rash    Antimicrobials this admission: 10/29 Vanc 10/29 Aztreonam 10/29 Metronidazole  Dose adjustments this admission:  Microbiology results: 10/29 blood cx x 2 >> sent  Thank you for allowing pharmacy to be a part of this patient's care.  Sandi CarneNick Shinika Estelle, PharmD Pharmacy Resident Pager: [phone] 02/19/2016 12:03 PM

## 2016-02-19 NOTE — ED Notes (Signed)
Lab notified that latic acid resulted at 2.6. Dr. Jacqulyn BathLong notified.

## 2016-02-19 NOTE — ED Notes (Signed)
Attempted report x1. 

## 2016-02-19 NOTE — ED Notes (Signed)
[facility] contacted to call Code Sepsis 
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
## 2016-02-19 NOTE — ED Notes (Signed)
While assessing pt, RN found a bedbug on pt. RN reported to charge nurse. Pt belongings double bagged and pt was washed up.

## 2016-02-19 NOTE — ED Triage Notes (Signed)
Pt brought in by EMS doe falling out of wheelchair this morning. Pt c/o of left leg pain, right and left hip pain. Pt received of fentayl enroute. Pt a&ox4. Pt HOH.

## 2016-02-20 ENCOUNTER — Observation Stay (HOSPITAL_COMMUNITY): Payer: Medicare Other

## 2016-02-20 DIAGNOSIS — L03115 Cellulitis of right lower limb: Secondary | ICD-10-CM

## 2016-02-20 DIAGNOSIS — M542 Cervicalgia: Secondary | ICD-10-CM

## 2016-02-20 DIAGNOSIS — B9689 Other specified bacterial agents as the cause of diseases classified elsewhere: Secondary | ICD-10-CM

## 2016-02-20 DIAGNOSIS — G8929 Other chronic pain: Secondary | ICD-10-CM | POA: Diagnosis not present

## 2016-02-20 DIAGNOSIS — L03116 Cellulitis of left lower limb: Secondary | ICD-10-CM

## 2016-02-20 DIAGNOSIS — M25552 Pain in left hip: Secondary | ICD-10-CM | POA: Diagnosis not present

## 2016-02-20 LAB — BASIC METABOLIC PANEL
Anion gap: 8 (ref 5–15)
BUN: 16 mg/dL (ref 6–20)
CHLORIDE: 103 mmol/L (ref 101–111)
CO2: 28 mmol/L (ref 22–32)
CREATININE: 1.12 mg/dL (ref 0.61–1.24)
Calcium: 8.2 mg/dL — ABNORMAL LOW (ref 8.9–10.3)
GFR calc Af Amer: >60 mL/min (ref >60)
GFR calc non Af Amer: >60 mL/min (ref >60)
Glucose, Bld: 67 mg/dL (ref 65–99)
POTASSIUM: 3.3 mmol/L — AB (ref 3.5–5.1)
Sodium: 139 mmol/L (ref 135–145)

## 2016-02-20 LAB — CBC
HEMATOCRIT: 35.2 % — AB (ref 39.0–52.0)
Hemoglobin: 11.1 g/dL — ABNORMAL LOW (ref 13.0–17.0)
MCH: 28.9 pg (ref 26.0–34.0)
MCHC: 31.5 g/dL (ref 30.0–36.0)
MCV: 91.7 fL (ref 78.0–100.0)
PLATELETS: 156 10*3/uL (ref 150–400)
RBC: 3.84 MIL/uL — ABNORMAL LOW (ref 4.22–5.81)
RDW: 18 % — AB (ref 11.5–15.5)
WBC: 9.2 10*3/uL (ref 4.0–10.5)

## 2016-02-20 LAB — GLUCOSE, CAPILLARY
GLUCOSE-CAPILLARY: 60 mg/dL — AB (ref 65–99)
GLUCOSE-CAPILLARY: 66 mg/dL (ref 65–99)
GLUCOSE-CAPILLARY: 65 mg/dL (ref 65–99)
Glucose-Capillary: 73 mg/dL (ref 65–99)
Glucose-Capillary: 135 mg/dL — ABNORMAL HIGH (ref 65–99)

## 2016-02-20 MED ORDER — GLUCERNA SHAKE PO LIQD: 237.0000 mL | Freq: Three times a day (TID) | ORAL | Status: DC

## 2016-02-20 MED ORDER — MORPHINE SULFATE (PF) 2 MG/ML IV SOLN
2.0000 mg | Freq: Once | INTRAVENOUS | Status: AC
  Administered 2016-02-20: 2 mg via INTRAVENOUS
  Filled 2016-02-20: qty 1

## 2016-02-20 NOTE — Consult Note (Signed)
WOC Nurse wound consult note Reason for Consult: Consult requested for bilat legs.  Pt was wearing nonadherent dressings/kerlex/coban prior to admission. Wound type: Patchy areas of partial thickness skin loss to BLE. Left leg 1X1X.1cm, Right leg 6X5X.1cm and 1X1X.1cm Wound bed: red moist wound beds Drainage (amount, consistency, odor) small amt yellow drainage Periwound: Generalized edema and erythremia to bilat legs, painful to touch, dry scabbed scaly skin surrounding open areas Dressing procedure/placement/frequency: Foam dressings to absorb drainage and promote healing.  Ace wraps to provide light compression; instructions provided for bedside nurses to change 3 times a week.   Please re-consult if further assistance is needed.  Thank-you,  Cammie Mcgeeawn Revere Maahs MSN, RN, CWOCN, [place], CNS [phone]

## 2016-02-20 NOTE — Discharge Summary (Signed)
Name: [name] MRN: [id number] DOB: 10/22/1935 80 y.o. PCP: Ailene RavelMaura L Hamrick, MD  Date of Admission: 02/19/2016  9:28 AM Date of Discharge: 02/20/2016 Attending Physician: Earl LagosNischal Narendra, MD  Discharge Diagnosis: 1. Mechanical fall with increased hip pain 2. Chronic lower extremity wounds  Discharge Medications:   Medication List    TAKE these medications   ACCU-CHEK SMARTVIEW test strip Generic drug:  glucose blood Use as directed   amiodarone 200 MG tablet Commonly known as:  PACERONE TAKE 1 TABLET BY MOUTH EVERY DAY   apixaban 5 MG Tabs tablet Commonly known as:  ELIQUIS Take 1 tablet (5 mg total) by mouth 2 (two) times daily.   atorvastatin 40 MG tablet Commonly known as:  LIPITOR Take 40 mg by mouth every morning.   BD INSULIN SYRINGE ULTRAFINE 31G X 5/16" 0.3 ML Misc Generic drug:  Insulin Syringe-Needle U-100 Use as directed   cilostazol 50 MG tablet Commonly known as:  PLETAL Take 1 tablet (50 mg total) by mouth 2 (two) times daily.   diclofenac sodium 1 % Gel Commonly known as:  VOLTAREN Apply 4 g topically 4 (four) times daily.   furosemide 40 MG tablet Commonly known as:  LASIX Take 80 mg by mouth 2 (two) times daily.   insulin glargine 100 UNIT/ML injection Commonly known as:  LANTUS Inject 0.2-0.22 mLs (20-22 Units total) into the skin at bedtime. What changed:  how much to take   KLOR-CON M20 20 MEQ tablet Generic drug:  potassium chloride SA Take 20 mEq by mouth 2 (two) times daily.   levothyroxine 200 MCG tablet Commonly known as:  SYNTHROID, LEVOTHROID Take 200 mcg by mouth daily before breakfast.   LIDOCAINE EX Apply 1 Dose topically daily as needed.   lipase/protease/amylase 4098112000 units Cpep capsule Commonly known as:  CREON Take 3 capsules by mouth 3 (three) times daily before meals.   metoprolol succinate 25 MG 24 hr tablet Commonly known as:  TOPROL-XL Take 0.5 tablets (12.5 mg total) by mouth daily.     multivitamin with minerals Tabs tablet Take 1 tablet by mouth every morning.   naproxen 500 MG tablet Commonly known as:  NAPROSYN Take 1 tablet (500 mg total) by mouth 2 (two) times daily with a meal.   oxyCODONE-acetaminophen 10-325 MG tablet Commonly known as:  PERCOCET Take 1 tablet by mouth 2 (two) times daily as needed.   saccharomyces boulardii 250 MG capsule Commonly known as:  FLORASTOR Take 1 capsule (250 mg total) by mouth every morning.   sulfamethoxazole-trimethoprim 800-160 MG tablet Commonly known as:  BACTRIM DS,SEPTRA DS Take 1 tablet by mouth 2 (two) times daily. Spouse states he has been taking for the past month   tamsulosin 0.4 MG Caps capsule Commonly known as:  FLOMAX Take 1 capsule (0.4 mg total) by mouth daily.   trolamine salicylate 10 % cream Commonly known as:  ASPERCREME Apply 1 application topically at bedtime.   venlafaxine XR 75 MG 24 hr capsule Commonly known as:  EFFEXOR-XR Take 75 mg by mouth daily with breakfast.       Disposition and follow-up:   [facility][name] [facility].  At the hospital follow up visit please address:  1.  Please continue the patient's current wound care. Please continue his trimethoprim sulfamethoxazole.  2.  Labs / imaging needed at time of follow-up: none  3.  Pending labs/ test needing follow-up: none  Follow-up Appointments: Advised  the patient to schedule an appointment with his PCP whom he sees frequently.  Hospital Course by problem list: Active Problems:   Wound infection   1. Mechanical fall  The patient presented to the [facility] emergency department on 02/19/2016 following a mechanical fall from his wheelchair. The patient was in his usual state of health until on the 29th his wife was trying to move him and he fell out of his wheelchair on top of her. He hit his neck during this fall and had some residual neck pain as well as  some increased left hip pain. He did not lose consciousness. In the emergency department he was only complaining of left hip pain which is chronic and did not seem increased from baseline. He denied chest pain or shortness of breath. He denied nausea, vomiting or abdominal pain. His wife informed us that he was in his complete usual state of health until this fall.  In the emergency department the patient was afebrile and hemodynamiclly stable.The patient had a left ankle radiograph which showed no fracture or subluxation. Left hip radiograph showed status post left total hip arthroplasty with no left hip dislocation and no evidence of hardware complication. Left knee film showed no acute fracture, joint effusion or malalignment area and chest x-ray showed stable cardiomegaly with mild pulmonary venous congestion no acute cardio pulmonary abnormality. The patient was then admitted to the hospital for pain control and further management. Once on service his pain returned to baseline. Per his wife he has chronic left hip pain and other areas of pain which were back at his steady state.  2. Chronic lower extremity wounds The patient has chronic bilateral lower extremity wounds in various stages of healing. He is followed closely in the outpatient setting by his primary care provider. He was recently started on trimethoprim sulfamethoxazole twice daily for treatment of chronic cellulitis. At the time of presentation to the emergency department I suspect the patient was admitted to the hospital secondary to these wounds and not from the mechanical fall. At the time of admission he had a leukocytosis with a white blood cell count of 13.8. He was negative for all other SIRS criteria but the emergency department called a code sepsis on the patient. When we evaluated the patient his wife informed us that his chronic wounds actually appeared better over the previous several days. He did not have a fever, tachycardia,  tachypnea or hypotension. The emergency department started the patient on IV aztreonam and metronidazole. He was then admitted to the [facility] internal medicine teaching service for further management. Once on service his antibiotics were discontinued and he was restarted on trimethoprim sulfamethoxazole. Wound care was consulted who evaluated the patient and replaced his dressings. He remained afebrile on service. He denied nausea, vomiting or abdominal pain. He'll be discharged on his home regimen of trimethoprim sulfamethoxazole.  Discharge Vitals:   BP (!) 120/51 (BP Location: Right Arm)   Pulse 79   Temp 97.9 F (36.6 C) (Axillary)   Resp 18   Ht 5\' 8"  (1.727 m)   Wt 226 lb (102.5 kg)   SpO2 95%   BMI 34.36 kg/m   Pertinent Labs, Studies, and Procedures:  1. Left diagnostic ankle film-diffuse soft tissue swelling. No gross fracture or subluxation of the left ankle. 2. Left diagnostic hip film-status post left total hip arthroplasty, no left hip dislocation, no evidence of hardware complication, no acute osseous fracture 3. Left diagnostic knee film-no acute fracture, joint  effusion or malalignment in the left knee 4. Chest radiograph-stable cardiomegaly with mild pulmonary venous congestion, no acute cardiopulmonary abnormality noted  Discharge Instructions: Discharge Instructions    Diet - low sodium heart healthy    Complete by:  As directed    Discharge instructions    Complete by:  As directed    Please make sure you follow-up with your primary care provider for continued wound care of your legs.   Please continue to take your medications as prescribed.   Increase activity slowly    Complete by:  As directed       Signed: Thomasene LotJames Tyneshia Stivers, MD 02/20/2016, 2:25 PM   Pager: [phone]

## 2016-02-20 NOTE — Progress Notes (Signed)
Inpatient Diabetes Program Recommendations  AACE/ADA: New Consensus Statement on Inpatient Glycemic Control (2015)  Target Ranges:  Prepandial:   less than 140 mg/dL      Peak postprandial:   less than 180 mg/dL (1-2 hours)      Critically ill patients:  140 - 180 mg/dL   Results for Arby BarretteRRINGTON, Surya C (MRN [id number]) as of 02/20/2016 11:23  Ref. Range 02/20/2016 07:50 02/20/2016 08:17 02/20/2016 08:52 02/20/2016 09:20  Glucose-Capillary Latest Ref Range: 65 - 99 mg/dL 60 (L) 66 65 73    Admit with: Mechanical Fall from Wheelchair with Neck Pain  History: DM  Home DM Meds: Lantus 22 units QHS  Current Insulin Orders: Lantus 12 units QHS                 Novolog Sensitive Correction Scale/ SSI (0-9 units) TID AC      Hypoglycemic this AM after receiving 12 units Lantus last PM.  MD- Please consider further reduction of Lantus to 8 units QHS      --Will follow patient during hospitalization--  Ambrose FinlandJeannine Johnston Sharday Michl RN, MSN, CDE Diabetes Coordinator Inpatient Glycemic Control Team Team Pager: [phone] (8a-5p)

## 2016-02-20 NOTE — Evaluation (Signed)
Occupational Therapy Evaluation Patient Details Name: [name] MRN: [id number] DOB: 01/03/1936 Today's Date: 02/20/2016    History of Present Illness Pt admitted after falling while transferring to w/c with his wife. PMH: CVA with L hemiplegia, PVD with hx of stenting, DM, afib, HTN, HLD, chronic pancreatitis, chronic B LE wounds, chronic back and hip pain.   Clinical Impression   Pt agreeable to transfer to chair. Performed lateral transfer to drop arm recliner with 2 person assist. Wife not available to demonstrate transfers they typically perform at home. Per pt, he is dependent in ADL at baseline and is w/c dependent.     Follow Up Recommendations  No OT follow up;Supervision/Assistance - 24 hour (SNF if wife is not able to manage him at home)    Equipment Recommendations       Recommendations for Other Services       Precautions / Restrictions Precautions Precautions: Fall      Mobility Bed Mobility Overal bed mobility: Needs Assistance Bed Mobility: Supine to Sit     Supine to sit: Max assist     General bed mobility comments: assist for LEs and trunk, cues for technique  Transfers Overall transfer level: Needs assistance   Transfers: Lateral/Scoot Transfers          Lateral/Scoot Transfers: +2 physical assistance;Max assist General transfer comment: scooted to drop arm recliner toward R with pad    Balance Overall balance assessment: Needs assistance   Sitting balance-Leahy Scale: Poor                                      ADL Overall ADL's : At baseline                                             Vision     Perception     Praxis      Pertinent Vitals/Pain Pain Assessment: Faces Faces Pain Scale: Hurts even more Pain Location: back Pain Descriptors / Indicators: Grimacing;Guarding Pain Intervention(s): Monitored during session;Limited activity within patient's tolerance;Patient requesting  pain meds-RN notified     Hand Dominance Right   Extremity/Trunk Assessment Upper Extremity Assessment Upper Extremity Assessment: LUE deficits/detail LUE Deficits / Details: longstanding hemiplegia   Lower Extremity Assessment Lower Extremity Assessment: Defer to PT evaluation       Communication Communication Communication: HOH   Cognition Arousal/Alertness: Awake/alert Behavior During Therapy: Agitated Overall Cognitive Status: No family/caregiver present to determine baseline cognitive functioning                     General Comments       Exercises       Shoulder Instructions      Home Living Family/patient expects to be discharged to:: Private residence Living Arrangements: Spouse/significant other Available Help at Discharge: Family;Available 24 hours/day                         Home Equipment: Wheelchair - manual          Prior Functioning/Environment Level of Independence: Needs assistance  Gait / Transfers Assistance Needed: w/c bound, assisted for transfers, pt sleeps in chair ADL's / Homemaking Assistance Needed: self feeds with set up, wife does bathing and dressing  OT Problem List: Decreased activity tolerance;Impaired balance (sitting and/or standing);Impaired vision/perception;Decreased coordination;Decreased cognition;Decreased knowledge of use of DME or AE;Obesity;Pain;Impaired UE functional use;Decreased range of motion;Decreased strength   OT Treatment/Interventions: Therapeutic activities;Patient/family education    OT Goals(Current goals can be found in the care plan section) Acute Rehab OT Goals Patient Stated Goal: to get pain medicine OT Goal Formulation: With patient Time For Goal Achievement: 02/27/16 Potential to Achieve Goals: Good ADL Goals Additional ADL Goal #1: Wife will demonstrate ability to assist pt with safe transfers to w/c.  OT Frequency: Min 2X/week   Barriers to D/C:             Co-evaluation              End of Session Nurse Communication: Mobility status  Activity Tolerance: Patient tolerated treatment well Patient left: in chair;with call bell/phone within reach;with chair alarm set;with nursing/sitter in room   Time: [id number] OT Time Calculation (min): 25 min Charges:  OT General Charges $OT Visit: 1 Procedure OT Evaluation $OT Eval Moderate Complexity: 1 Procedure OT Treatments $Therapeutic Activity: 8-22 mins G-Codes:    Evern BioMayberry, Isobelle Tuckett Lynn 02/20/2016, 3:18 PM  [phone]

## 2016-02-20 NOTE — Progress Notes (Signed)
Initial Nutrition Assessment  DOCUMENTATION CODES:   Obesity unspecified  INTERVENTION:  Provide Glucerna Shake po TID, each supplement provides 220 kcal and 10 grams of protein.  NUTRITION DIAGNOSIS:   Increased nutrient needs related to wound healing as evidenced by estimated needs.  GOAL:   Patient will meet greater than or equal to 90% of their needs  MONITOR:   PO intake, Supplement acceptance, Labs, Weight trends, Skin, I & O's  REASON FOR ASSESSMENT:   Consult Wound healing  ASSESSMENT:   80 y/o male with PMH of CVA with L sided hemiparesis, PVD s/p b/l iliac stenting, DM, afib on eliquis, HLD, HTN, chronic pancreatitis, chronic pain and chronic b/l LE wounds who p/w mechanical fall   Meal completion has been 0-25%. Pt report pain during time of visit and did not respond to most questions asked. No family at bedside. RD unable to obtain most recent nutrition history. RD consulted for wound healing. RD to order nutritional supplements. Possible plans for discharge today.   Pt with no observed significant fat or muscle mass loss.   Labs and medications reviewed.   Diet Order:  Diet Carb Modified Fluid consistency: Thin; Room service appropriate? Yes  Skin:  Wound (see comment) (LE wounds)  Last BM:  10/29  Height:   Ht Readings from Last 1 Encounters:  02/19/16 5\' 8"  (1.727 m)    Weight:   Wt Readings from Last 1 Encounters:  02/19/16 226 lb (102.5 kg)    Ideal Body Weight:  70 kg  BMI:  Body mass index is 34.36 kg/m.  Estimated Nutritional Needs:   Kcal:  2000-2200  Protein:  100-115 grams  Fluid:  2-2.2 L/day  EDUCATION NEEDS:   No education needs identified at this time  Roslyn SmilingStephanie Jonnell Hentges, MS, RD, LDN Pager # [phone] After hours/ weekend pager # [phone]

## 2016-02-20 NOTE — Progress Notes (Signed)
PT Cancellation Note  Patient Details Name: [name] MRN: [id number] DOB: 11/01/1935   Cancelled Treatment:    Reason Eval/Treat Not Completed: Patient declined, no reason specified.  Will see as able today, if not 10/31 am. 02/20/2016  [name], PT [phone]  (pager)   [name], [name] 02/20/2016, 12:12 PM

## 2016-02-20 NOTE — Care Management Obs Status (Signed)
MEDICARE OBSERVATION STATUS NOTIFICATION   Patient Details  Name: [name] MRN: [id number] Date of Birth: 08/10/1935   Medicare Observation Status Notification Given:  Yes    Jennifer Payes, Annamarie MajorCheryl U, RN 02/20/2016, 11:57 AM
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
## 2016-02-22 NOTE — Progress Notes (Signed)
Late entry due to missed g-code.   02/22/16 0700  OT G-codes **NOT FOR INPATIENT CLASS**  Functional Assessment Tool Used clinical judgement  Functional Limitation Self care  Self Care Current Status [phone](G8987) CM  Self Care Goal Status ([id number]([id number]) CM  Self Care Discharge Status [phone](G8989) CM  02/22/2016 Martie RoundJulie Kyrra Prada, OTR/L Pager: [phone]

## 2016-02-24 LAB — CULTURE, BLOOD (ROUTINE X 2)
CULTURE: NO GROWTH
Culture: NO GROWTH

## 2016-03-23 DEATH — deceased
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
## 2017-09-02 IMAGING — DX DG FOOT COMPLETE 3+V*L*
3 series · 3 of 3 positions shown · non-contrast
Comparison: None.

CLINICAL DATA: Pain redness of the left foot for 3 days

EXAM:
LEFT FOOT - COMPLETE 3+ VIEW

[foot ap]
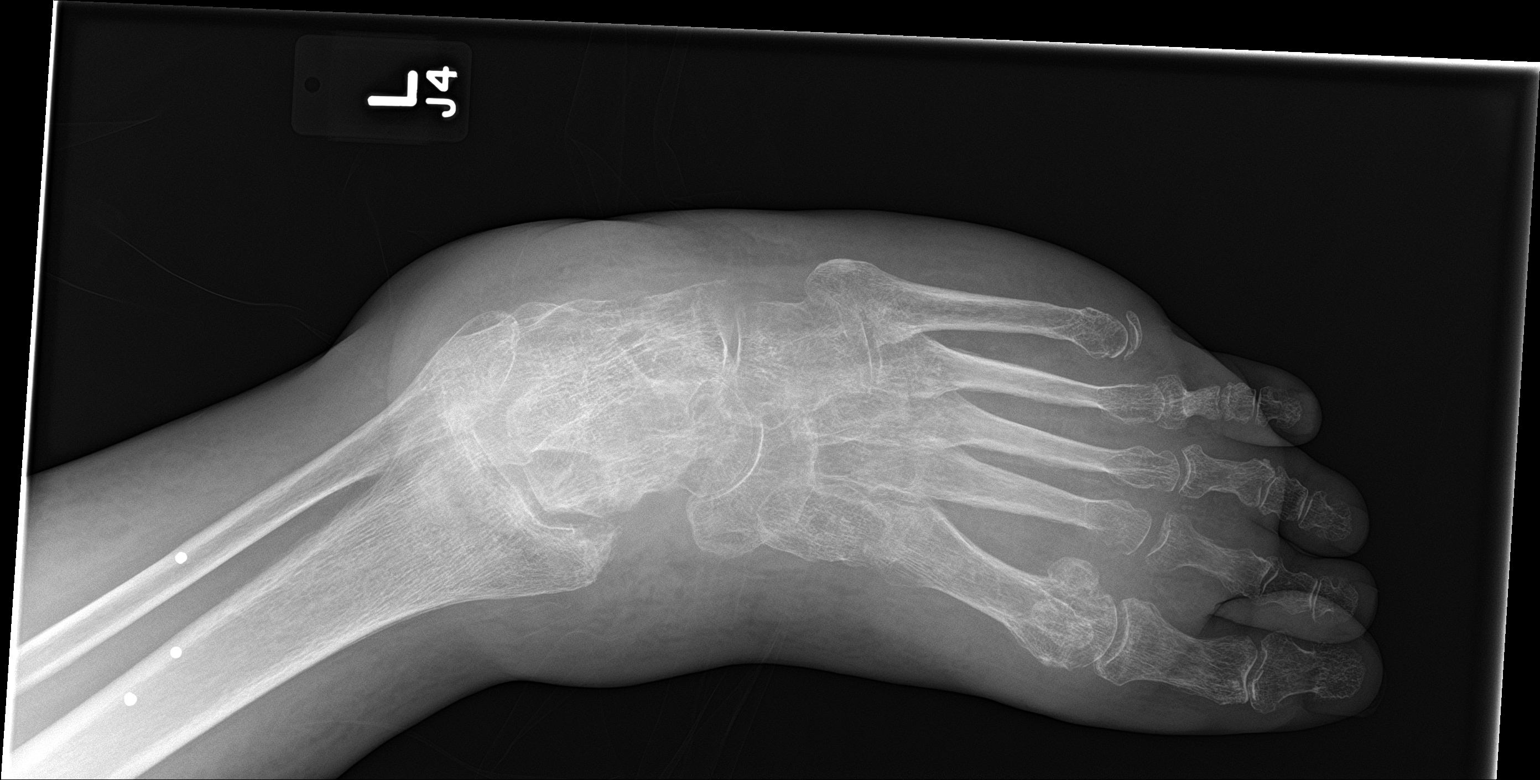

[foot obl]
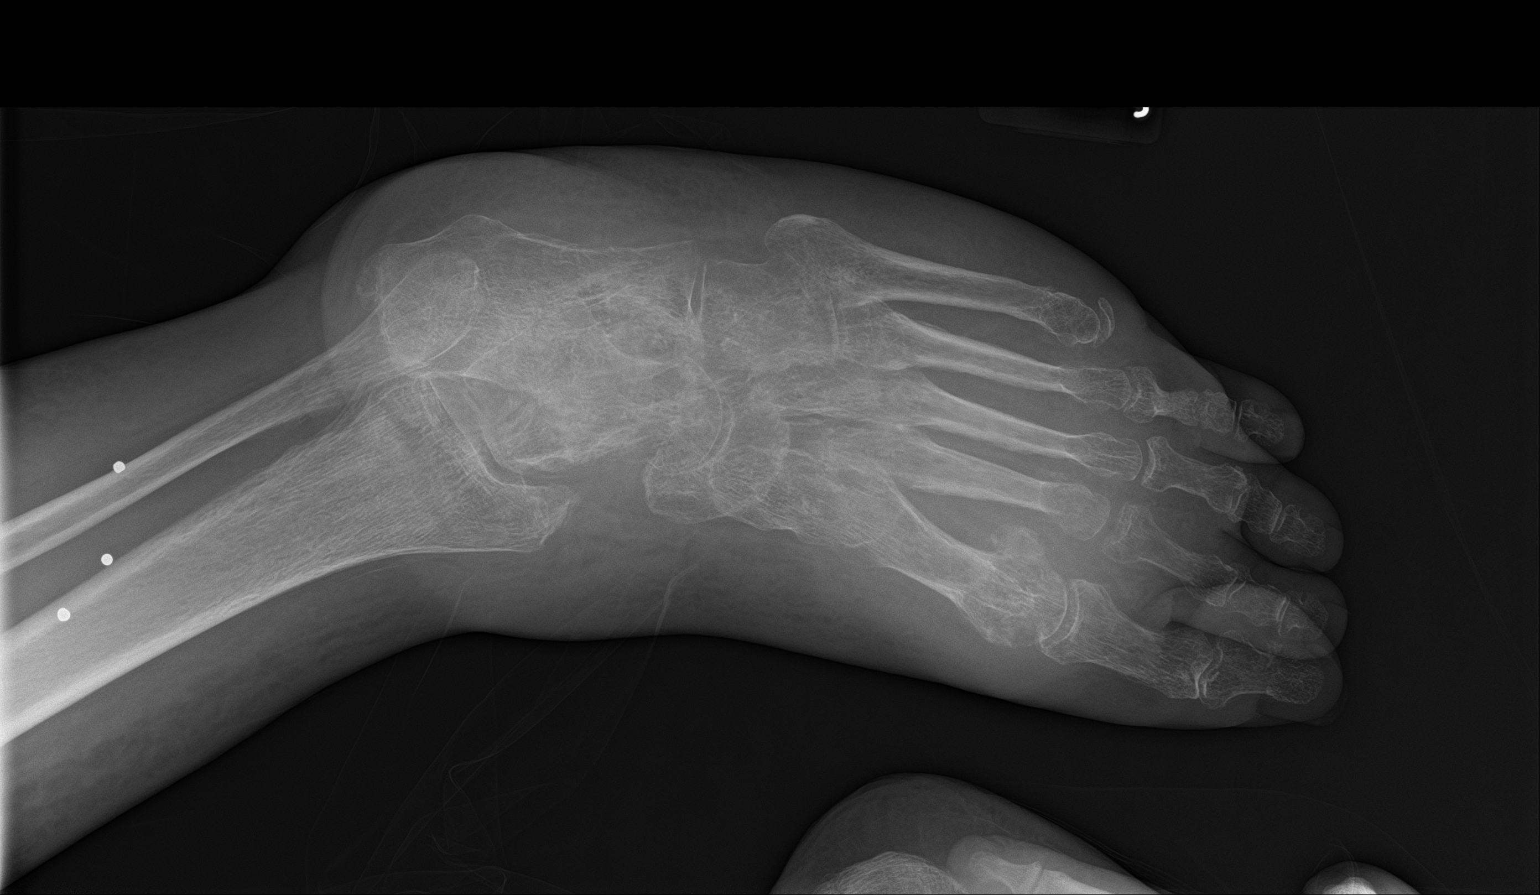

[foot lat]
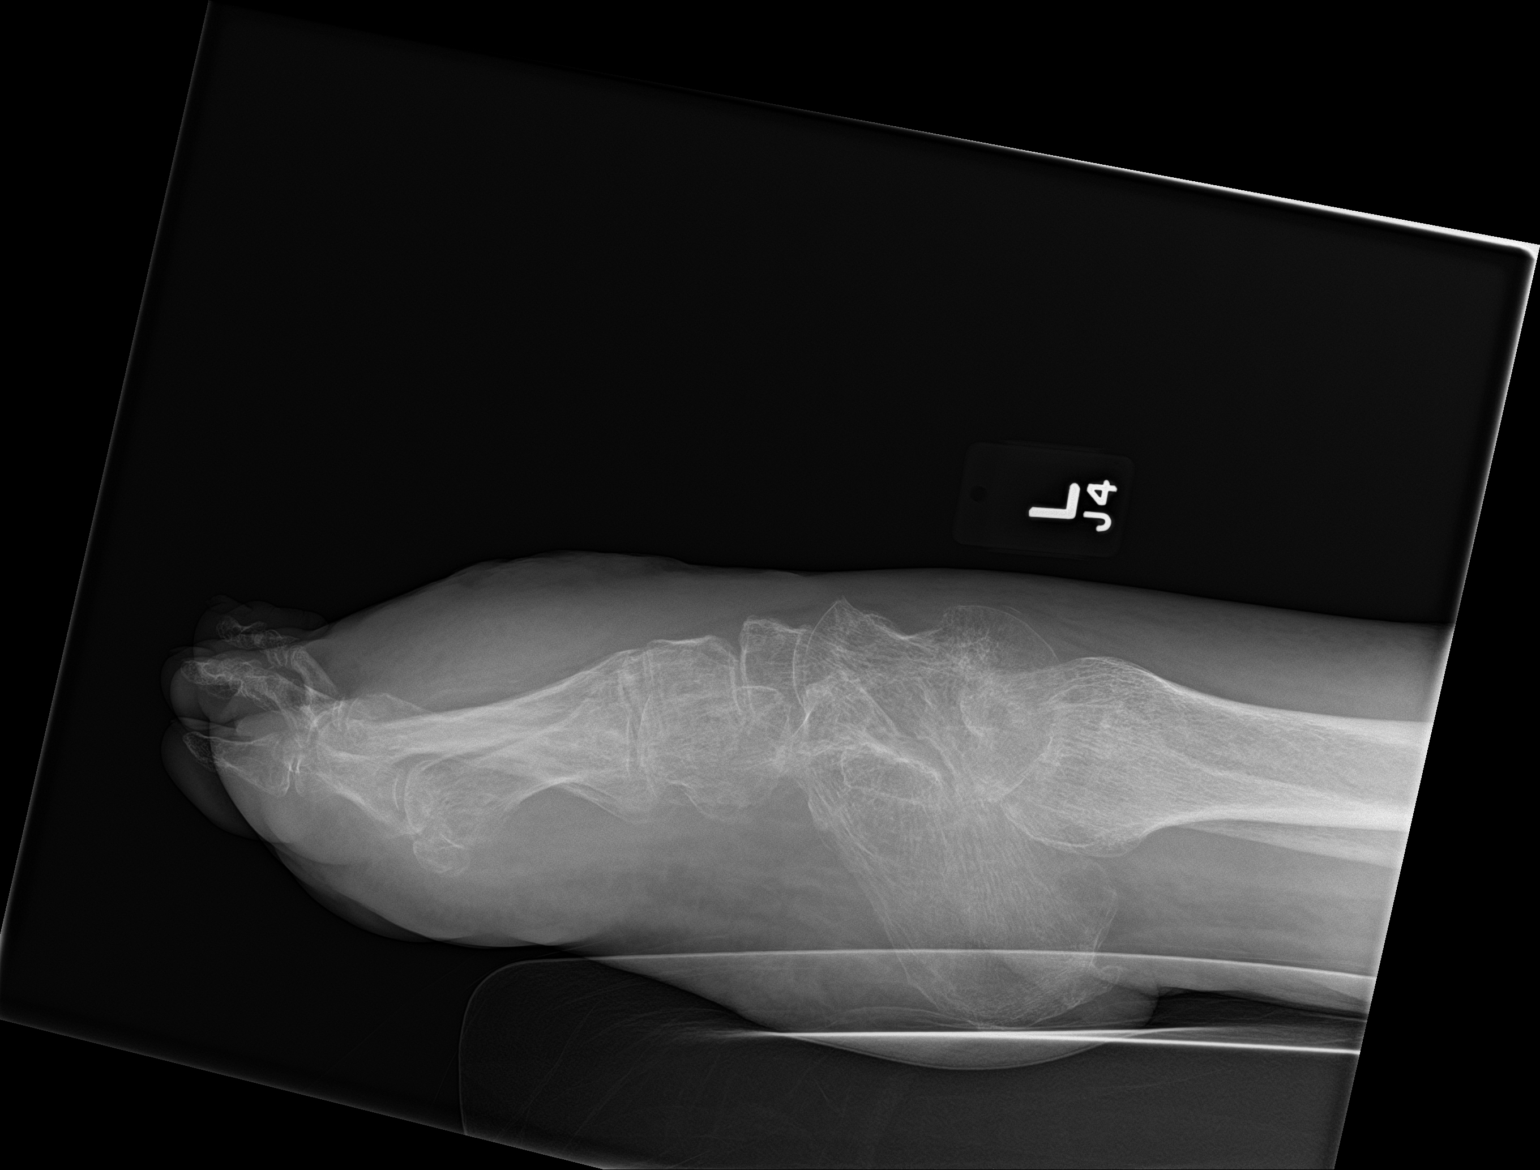

[3 of 3 positions shown; findings below may reference images not displayed]

FINDINGS: Severe osteopenia. Prior amputation of the fifth phalanx. No acute
fracture or dislocation. No bone destruction or periosteal reaction.
Generalized soft tissue swelling of the left foot and ankle most
severe along the dorsal forefoot.
IMPRESSION: No evidence of osteomyelitis of the left foot.

## 2017-09-02 IMAGING — DX DG HIP (WITH OR WITHOUT PELVIS) 2-3V*L*
3 series · 3 of 3 positions shown · non-contrast
Comparison: None.

CLINICAL DATA: Left hip pain.  No known injury.

EXAM:
DG HIP (WITH OR WITHOUT PELVIS) 2-3V LEFT

[t pelvis ap]
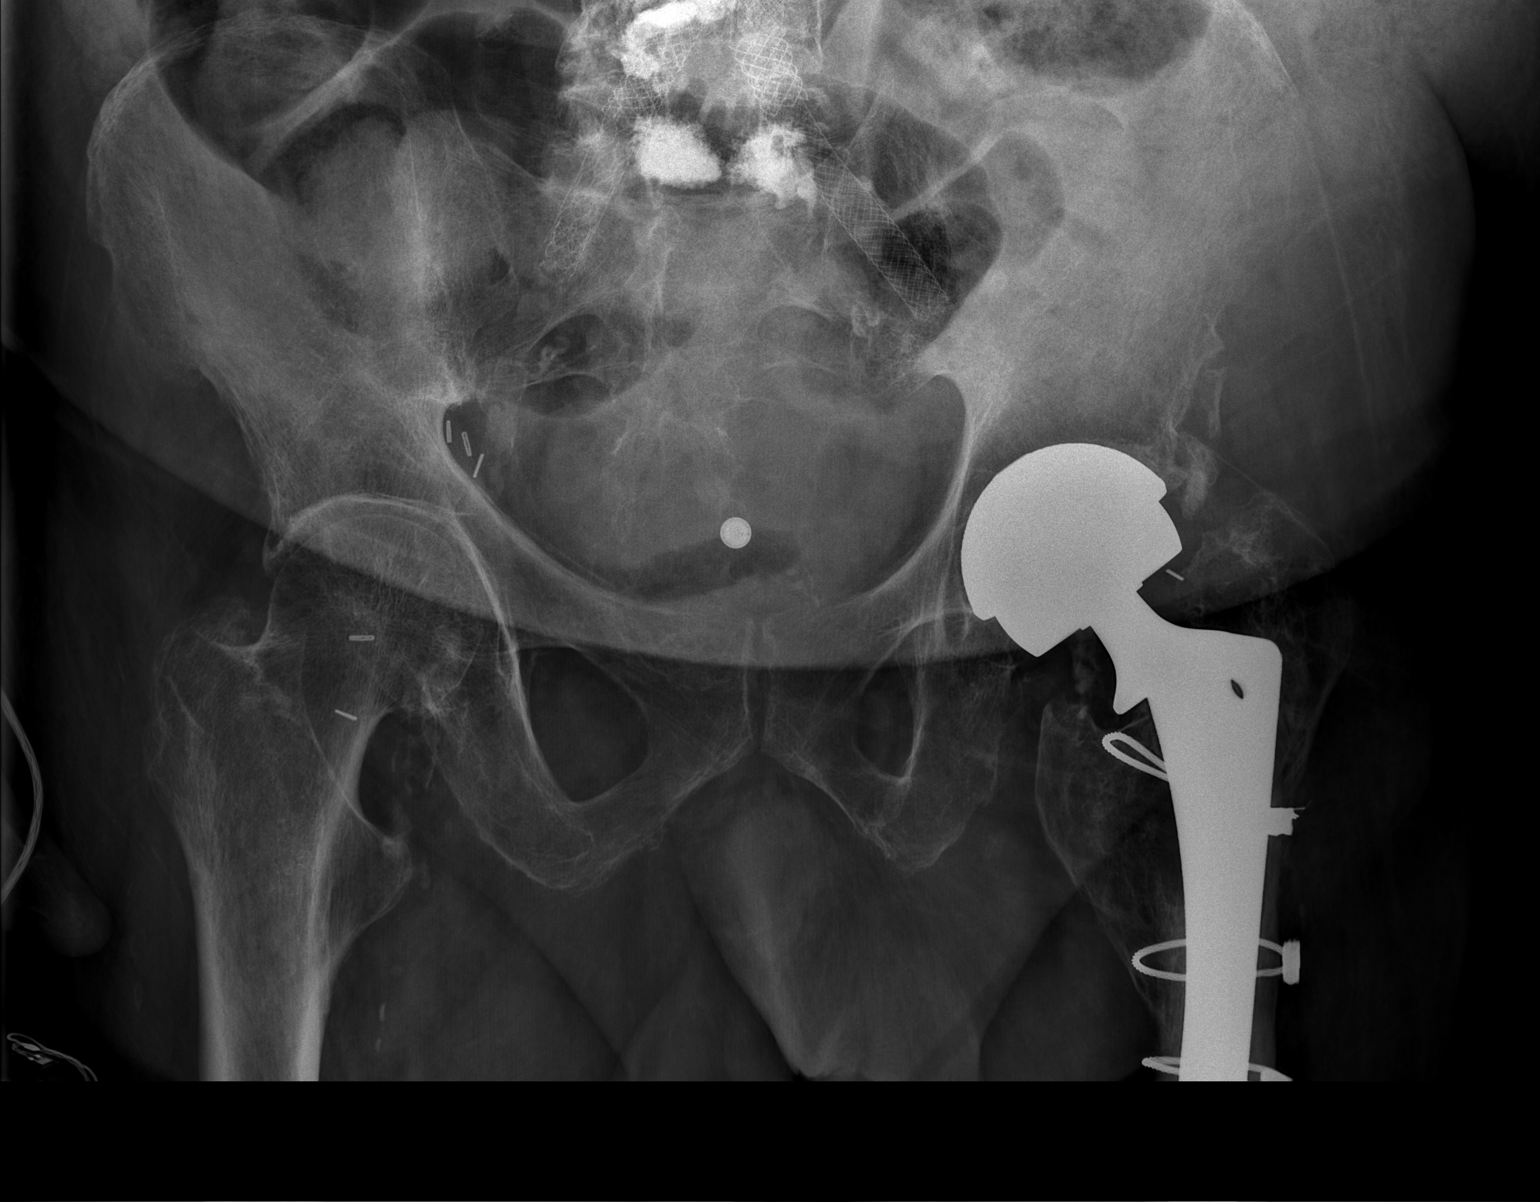

[t hip ap left]
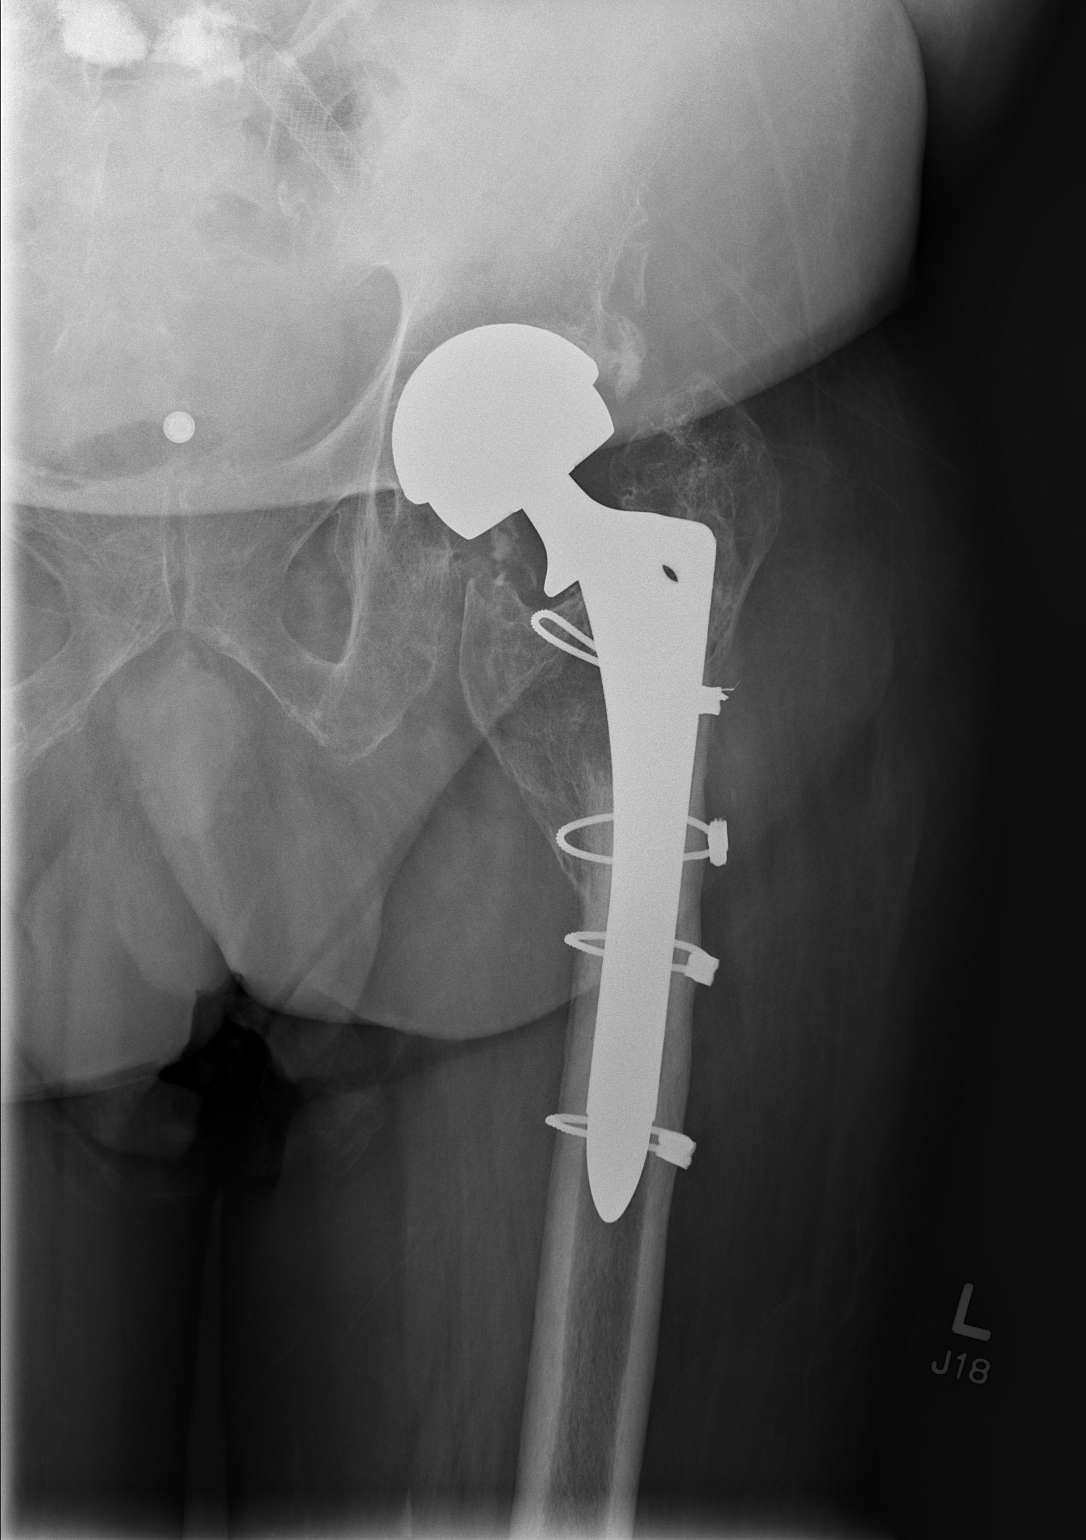

[t hip frog leg left]
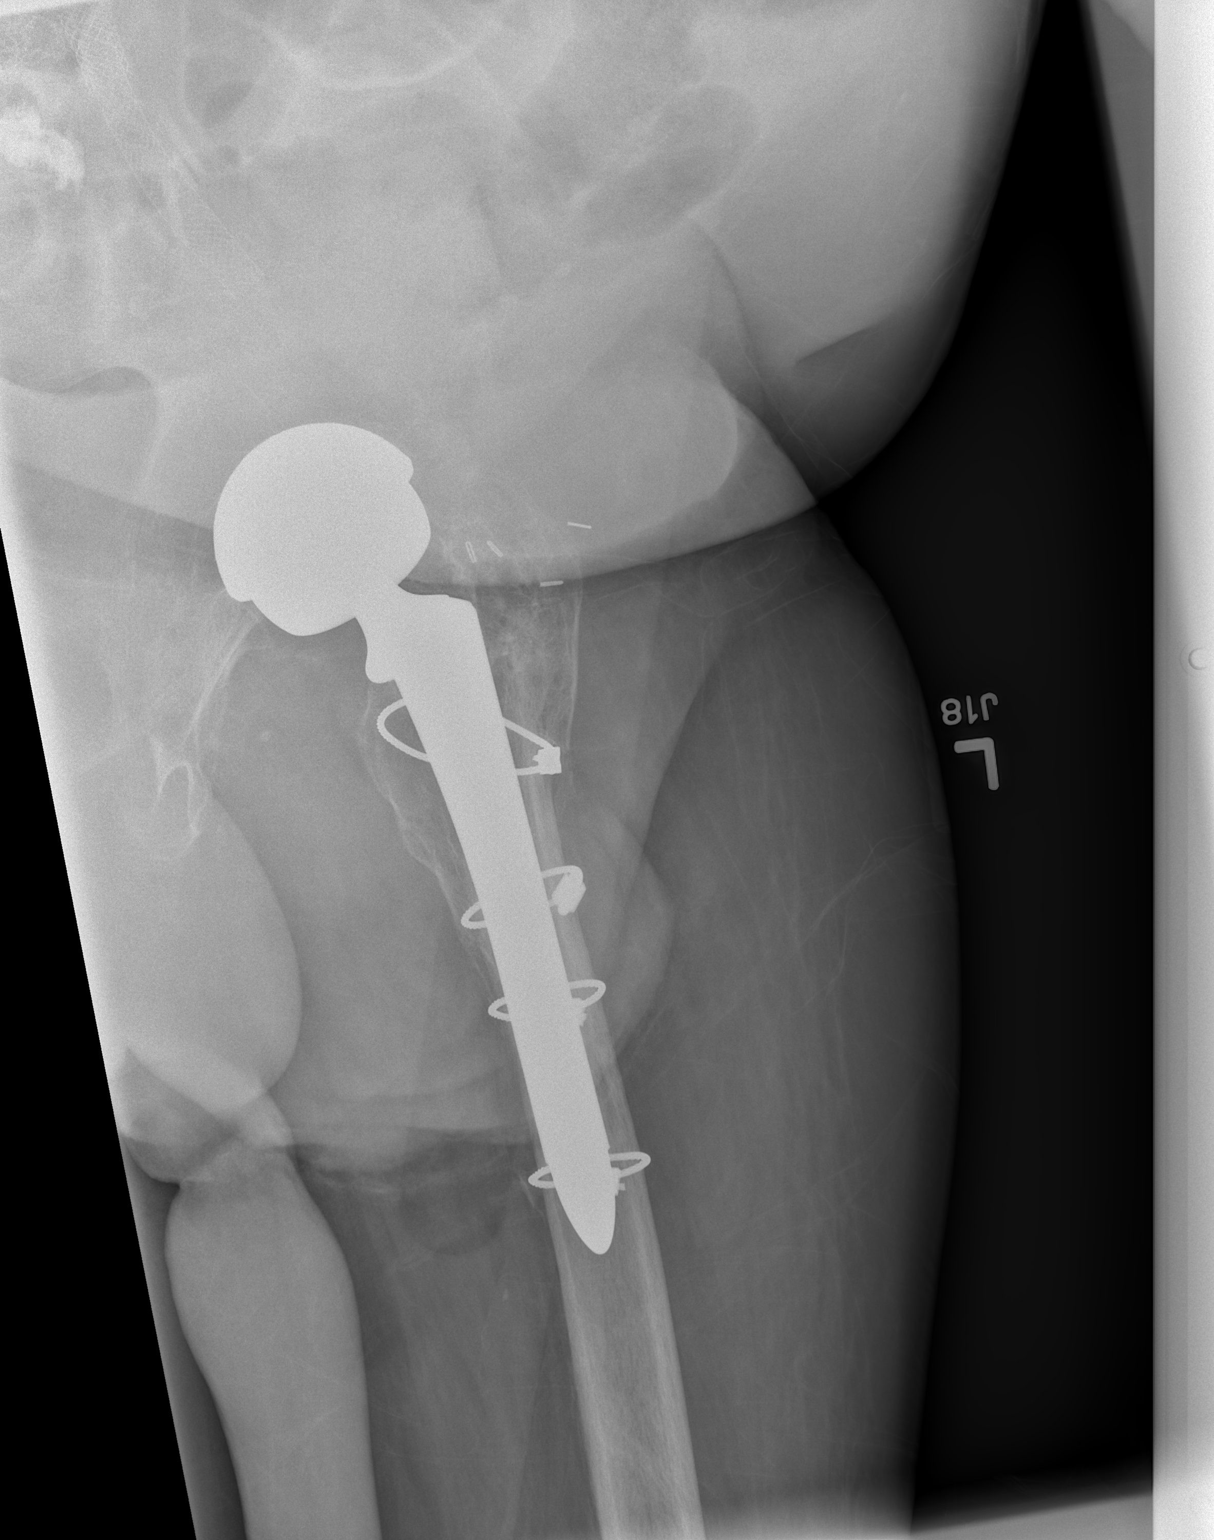

[3 of 3 positions shown; findings below may reference images not displayed]

FINDINGS: A left hip bipolar prosthesis and cerclage wires are demonstrated.
No acute fracture or dislocation. Diffuse osteopenia. Atheromatous
arterial calcifications. Right groin vascular clips. Bilateral iliac
artery stents. Kyphoplasty material in the lower lumbar spine with
lower lumbar spine degenerative changes.
IMPRESSION: No acute abnormality.

## 2017-09-03 IMAGING — CT CT HIP*L* W/O CM
2 of 5 series · 17 of 46 positions shown, 19 images · non-contrast
Comparison: Radiographs dated 10/20/2015

CLINICAL DATA: Onset of severe left hip pain yesterday. Previous
left total hip replacement.

EXAM:
CT OF THE LEFT HIP WITHOUT CONTRAST
TECHNIQUE: Multidetector CT imaging of the left hip was performed according to
the standard protocol. Multiplanar CT image reconstructions were
also generated.

[Series 3: hip thin (person_name) (person_name) · axial · 0.53mm/px · z∈[-499,-228]mm · 14 of 746 slices shown, 16 images]
[im 34/746  soft-tissue]
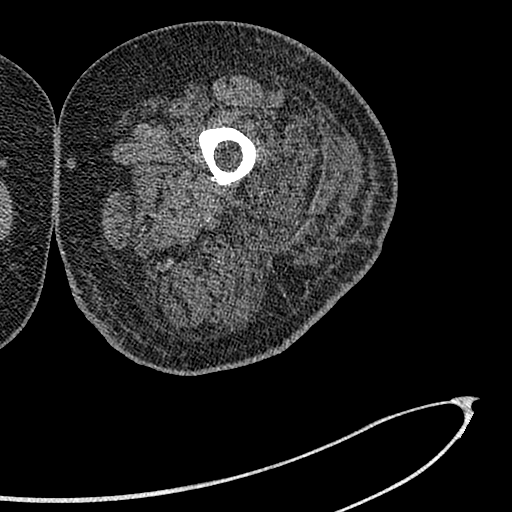
[im 34/746  bone]
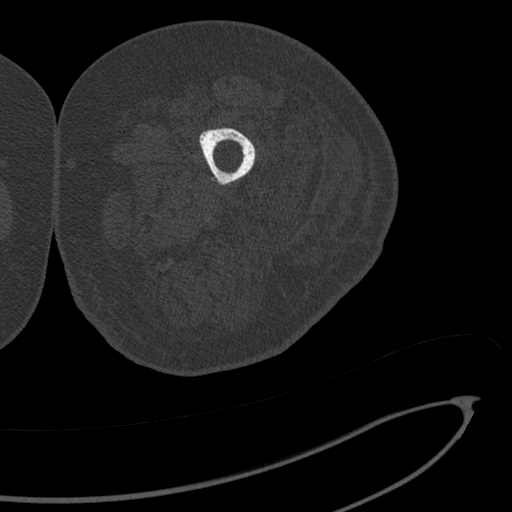
[im 102/746  soft-tissue]
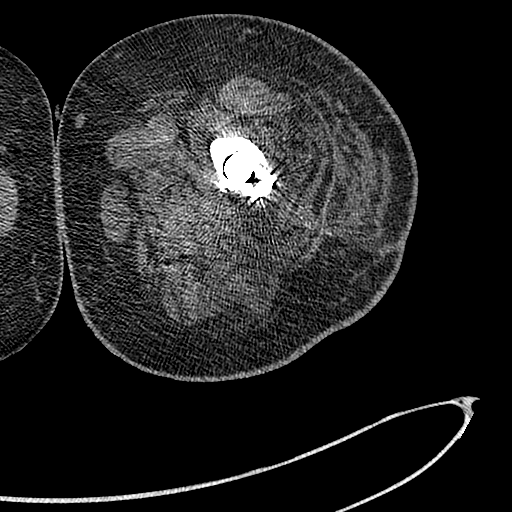
[im 136/746  soft-tissue]
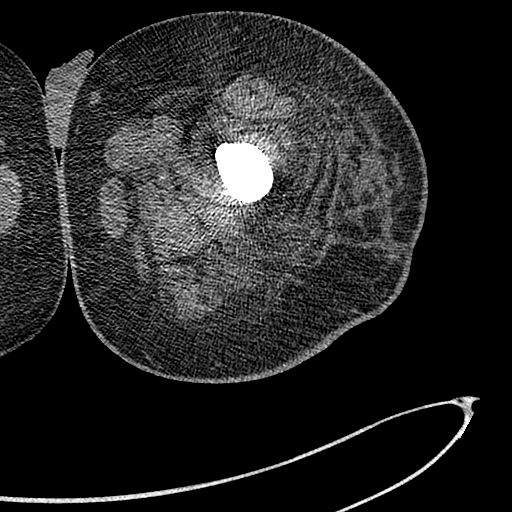
[im 204/746  soft-tissue]
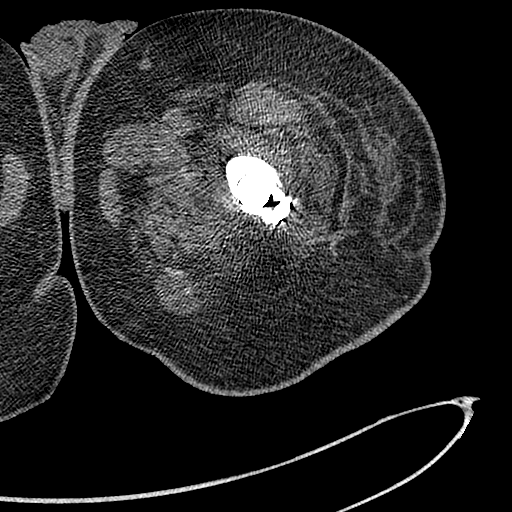
[im 238/746  soft-tissue]
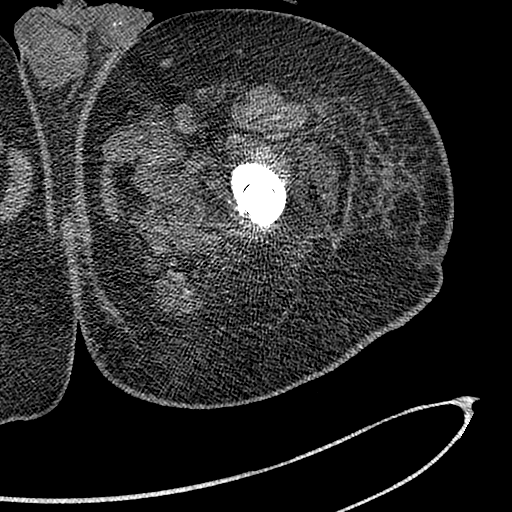
[im 305/746  soft-tissue]
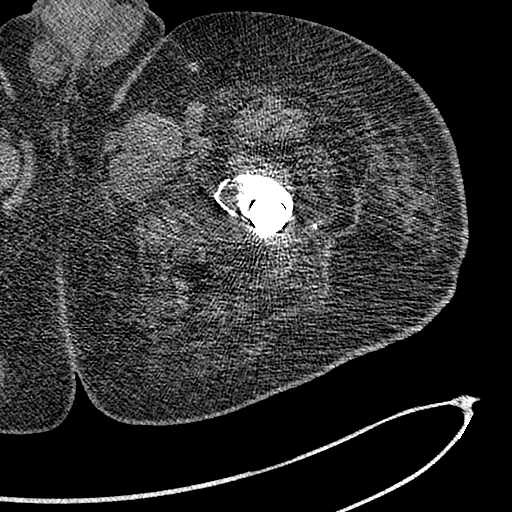
[im 339/746  soft-tissue]
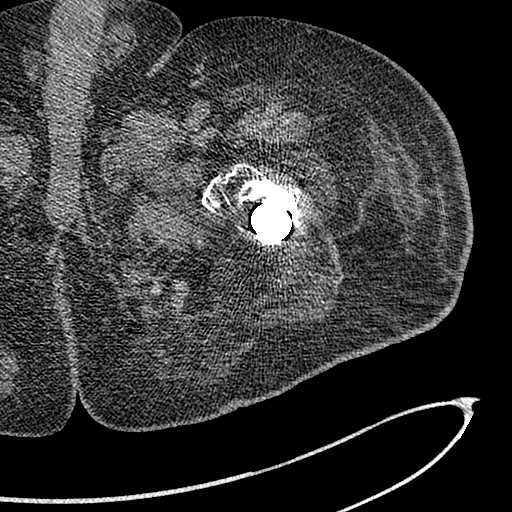
[im 407/746  soft-tissue]
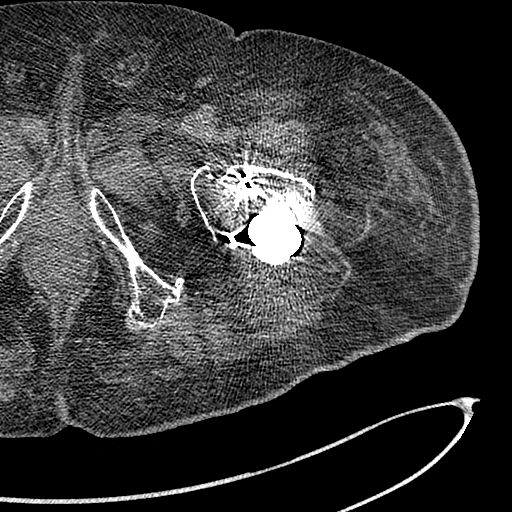
[im 441/746  soft-tissue]
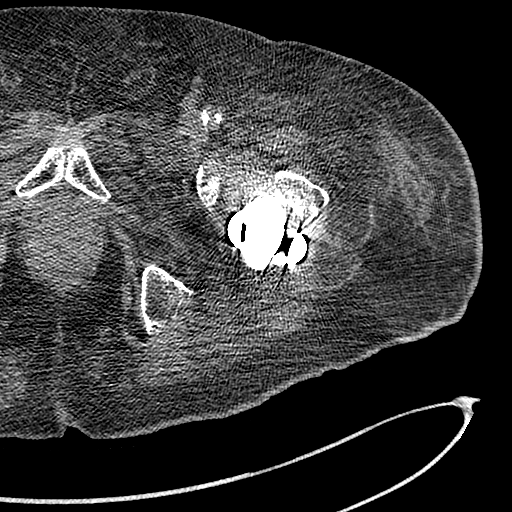
[im 441/746  bone]
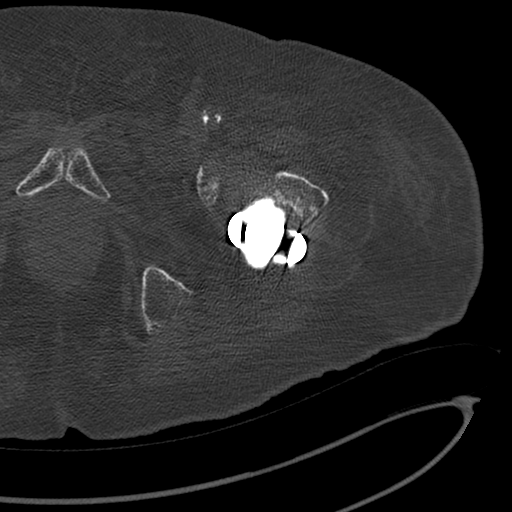
[im 508/746  soft-tissue]
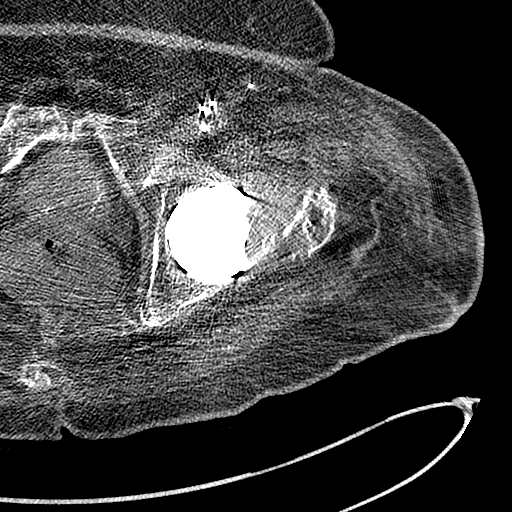
[im 542/746  soft-tissue]
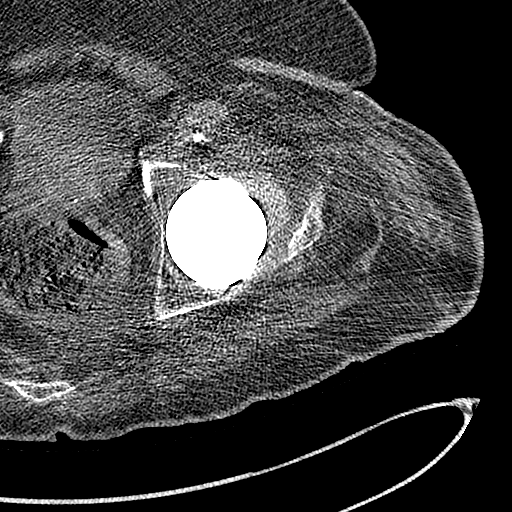
[im 610/746  soft-tissue]
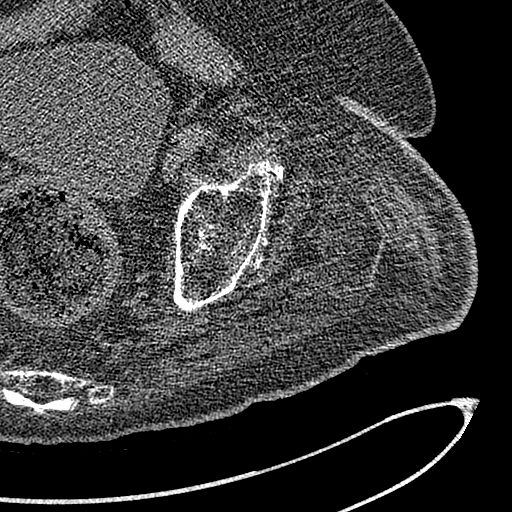
[im 644/746  soft-tissue]
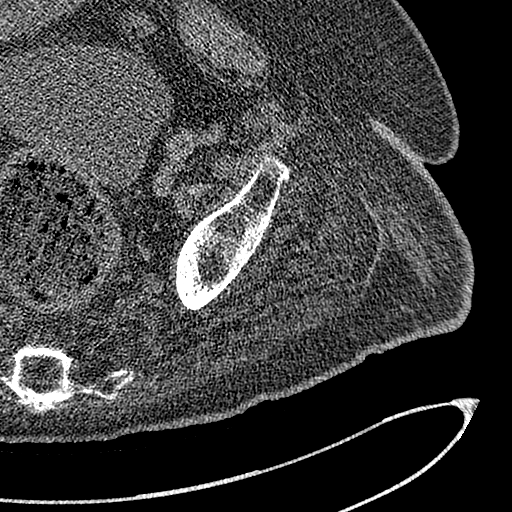
[im 712/746  soft-tissue]
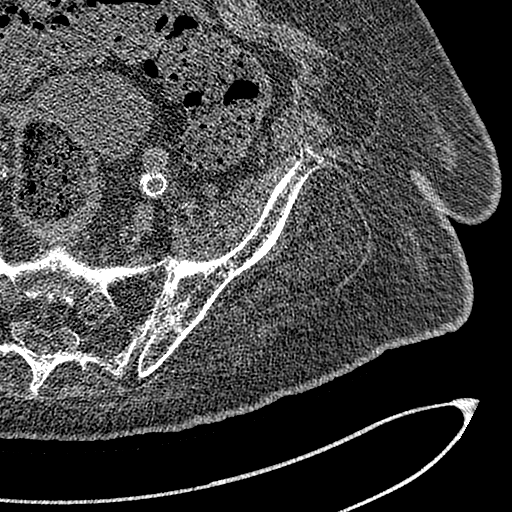

[Series 5: hip 2.0 cor. (person_name) · coronal · 0.47mm/px · 3 of 141 slices shown]
[im 47/141  soft-tissue]
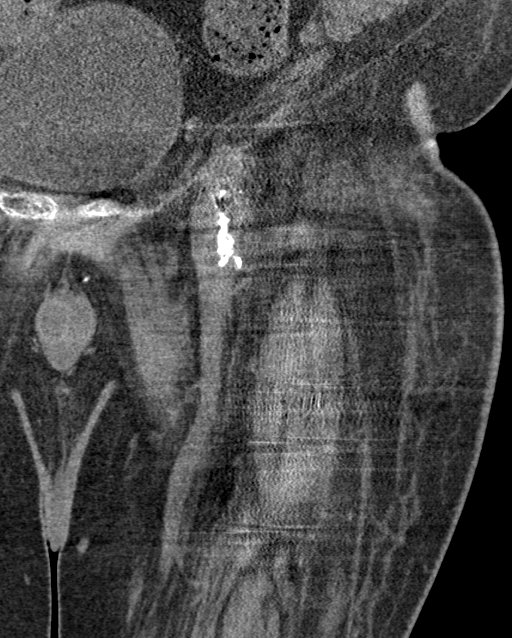
[im 63/141  soft-tissue]
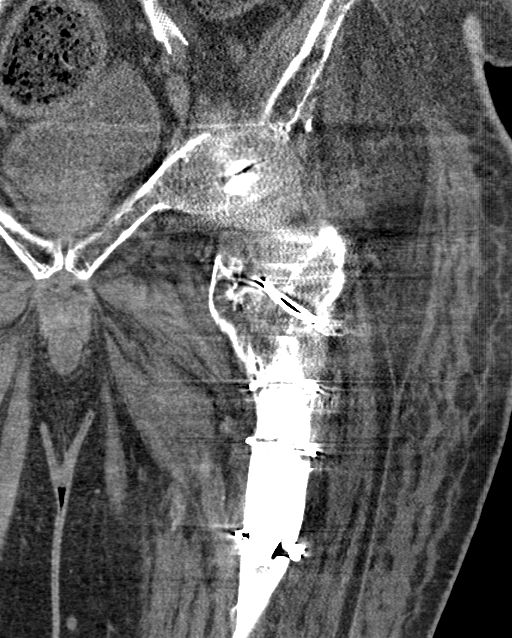
[im 78/141  soft-tissue]
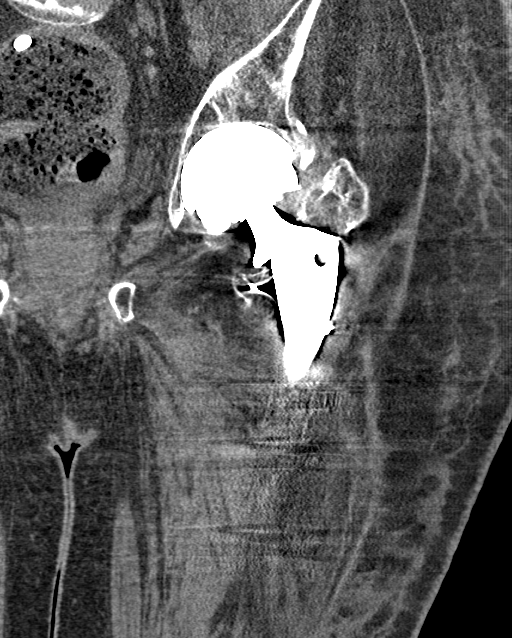

[17 of 46 positions shown; findings below may reference images not displayed]

FINDINGS: There is no fracture or dislocation. The components of the hip
prosthesis appear in good position.

There is abnormal soft tissue around the neck of the prosthesis and
there is bony erosion in the lesser trochanter. This is consistent
with a giant cell reaction. No erosion around of acetabular
component of the prosthesis.

Slight edema in the subcutaneous fat lateral to the greater
trochanter.

Stents in the common iliac arteries are noted.
IMPRESSION: Bony erosion and abnormal soft tissue in the hip joint consistent
with giant cell reaction. No fracture.
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
## 2018-01-02 IMAGING — CR DG CHEST 1V PORT
1 series · 1 of 1 positions shown · non-contrast
Comparison: January 20, 2014

CLINICAL DATA: Fall.

EXAM:
PORTABLE CHEST 1 VIEW

[AP]
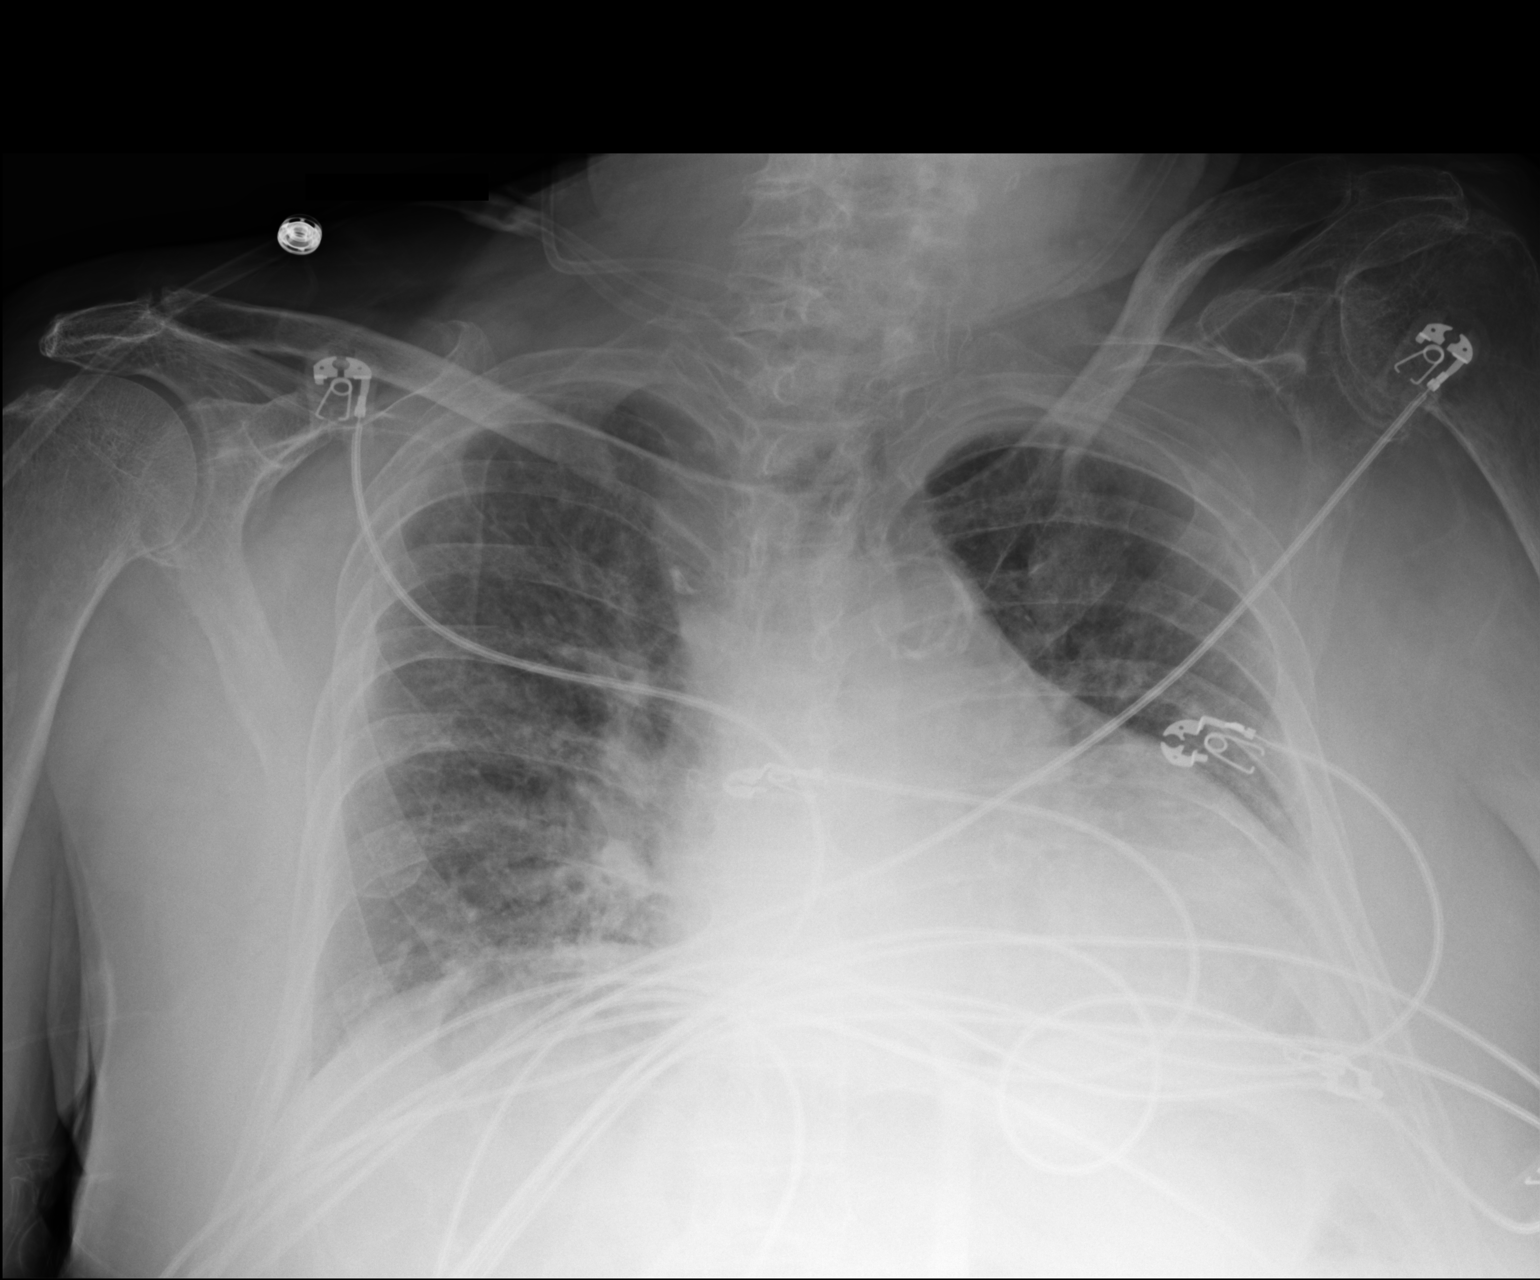

[1 of 1 positions shown; findings below may reference images not displayed]

FINDINGS: Stable cardiomegaly. No pneumothorax. Mild interstitial prominence
suggests pulmonary venous congestion. No pulmonary nodules, masses,
or focal infiltrates.
IMPRESSION: Stable cardiomegaly with mild pulmonary venous congestion.
